# Patient Record
Sex: Female | Born: 1960 | Race: White | Hispanic: No | Marital: Married | Smoking: Former smoker
Health system: Southern US, Community
[De-identification: ages and names within clinical notes are randomized; demographics above are authoritative.]

## PROBLEM LIST (undated history)

## (undated) DIAGNOSIS — Z86718 Personal history of other venous thrombosis and embolism: Secondary | ICD-10-CM

## (undated) DIAGNOSIS — N393 Stress incontinence (female) (male): Secondary | ICD-10-CM

## (undated) DIAGNOSIS — K5792 Diverticulitis of intestine, part unspecified, without perforation or abscess without bleeding: Secondary | ICD-10-CM

## (undated) DIAGNOSIS — C8110 Nodular sclerosis classical Hodgkin lymphoma, unspecified site: Secondary | ICD-10-CM

## (undated) DIAGNOSIS — G4733 Obstructive sleep apnea (adult) (pediatric): Secondary | ICD-10-CM

## (undated) DIAGNOSIS — G709 Myoneural disorder, unspecified: Secondary | ICD-10-CM

## (undated) DIAGNOSIS — M1990 Unspecified osteoarthritis, unspecified site: Secondary | ICD-10-CM

## (undated) DIAGNOSIS — F5081 Binge eating disorder: Secondary | ICD-10-CM

## (undated) DIAGNOSIS — R112 Nausea with vomiting, unspecified: Secondary | ICD-10-CM

## (undated) DIAGNOSIS — G971 Other reaction to spinal and lumbar puncture: Secondary | ICD-10-CM

## (undated) DIAGNOSIS — F418 Other specified anxiety disorders: Secondary | ICD-10-CM

## (undated) DIAGNOSIS — K219 Gastro-esophageal reflux disease without esophagitis: Secondary | ICD-10-CM

## (undated) DIAGNOSIS — G8929 Other chronic pain: Secondary | ICD-10-CM

## (undated) DIAGNOSIS — Z86711 Personal history of pulmonary embolism: Secondary | ICD-10-CM

## (undated) DIAGNOSIS — Z9889 Other specified postprocedural states: Secondary | ICD-10-CM

## (undated) DIAGNOSIS — Z9884 Bariatric surgery status: Secondary | ICD-10-CM

## (undated) DIAGNOSIS — M549 Dorsalgia, unspecified: Secondary | ICD-10-CM

## (undated) DIAGNOSIS — S2239XA Fracture of one rib, unspecified side, initial encounter for closed fracture: Secondary | ICD-10-CM

## (undated) HISTORY — DX: Other specified anxiety disorders: F41.8

## (undated) HISTORY — DX: Gastro-esophageal reflux disease without esophagitis: K21.9

## (undated) HISTORY — PX: PATELLA FRACTURE SURGERY: SHX735

## (undated) HISTORY — DX: Nodular sclerosis Hodgkin lymphoma, unspecified site: C81.10

## (undated) HISTORY — PX: EYE SURGERY: SHX253

## (undated) HISTORY — DX: Diverticulitis of intestine, part unspecified, without perforation or abscess without bleeding: K57.92

## (undated) HISTORY — PX: TRANSTHORACIC ECHOCARDIOGRAM: SHX275

## (undated) HISTORY — DX: Personal history of other venous thrombosis and embolism: Z86.718

## (undated) HISTORY — DX: Binge eating disorder: F50.81

## (undated) HISTORY — DX: Personal history of pulmonary embolism: Z86.711

---

## 1980-10-12 HISTORY — PX: TONSILLECTOMY: SUR1361

## 1983-10-13 HISTORY — PX: TONSILECTOMY, ADENOIDECTOMY, BILATERAL MYRINGOTOMY AND TUBES: SHX2538

## 1997-10-12 HISTORY — PX: GASTRIC BYPASS: SHX52

## 1998-03-12 DIAGNOSIS — Z9884 Bariatric surgery status: Secondary | ICD-10-CM

## 1998-03-12 HISTORY — DX: Bariatric surgery status: Z98.84

## 2001-10-12 HISTORY — PX: LASIK: SHX215

## 2007-10-13 HISTORY — PX: LUMBAR DISC SURGERY: SHX700

## 2009-10-12 HISTORY — PX: OTHER SURGICAL HISTORY: SHX169

## 2009-12-24 LAB — HM COLONOSCOPY: HM Colonoscopy: NORMAL

## 2010-10-21 LAB — HM PAP SMEAR: HM Pap smear: NORMAL

## 2010-10-21 LAB — CONVERTED CEMR LAB: Pap Smear: NORMAL

## 2010-11-07 ENCOUNTER — Ambulatory Visit: Payer: Self-pay | Admitting: Oncology

## 2010-11-11 ENCOUNTER — Telehealth: Payer: Self-pay | Admitting: Internal Medicine

## 2010-11-11 ENCOUNTER — Other Ambulatory Visit: Payer: Self-pay | Admitting: Internal Medicine

## 2010-11-11 ENCOUNTER — Ambulatory Visit
Admission: RE | Admit: 2010-11-11 | Discharge: 2010-11-11 | Payer: Self-pay | Source: Home / Self Care | Attending: Internal Medicine | Admitting: Internal Medicine

## 2010-11-11 DIAGNOSIS — I82409 Acute embolism and thrombosis of unspecified deep veins of unspecified lower extremity: Secondary | ICD-10-CM | POA: Insufficient documentation

## 2010-11-11 DIAGNOSIS — I2699 Other pulmonary embolism without acute cor pulmonale: Secondary | ICD-10-CM | POA: Insufficient documentation

## 2010-11-11 DIAGNOSIS — Z8571 Personal history of Hodgkin lymphoma: Secondary | ICD-10-CM | POA: Insufficient documentation

## 2010-11-11 DIAGNOSIS — F411 Generalized anxiety disorder: Secondary | ICD-10-CM | POA: Insufficient documentation

## 2010-11-11 DIAGNOSIS — M1712 Unilateral primary osteoarthritis, left knee: Secondary | ICD-10-CM | POA: Insufficient documentation

## 2010-11-11 DIAGNOSIS — K219 Gastro-esophageal reflux disease without esophagitis: Secondary | ICD-10-CM | POA: Insufficient documentation

## 2010-11-11 DIAGNOSIS — G8929 Other chronic pain: Secondary | ICD-10-CM | POA: Insufficient documentation

## 2010-11-11 DIAGNOSIS — M545 Low back pain: Secondary | ICD-10-CM | POA: Insufficient documentation

## 2010-11-11 DIAGNOSIS — F329 Major depressive disorder, single episode, unspecified: Secondary | ICD-10-CM | POA: Insufficient documentation

## 2010-11-11 DIAGNOSIS — Z86718 Personal history of other venous thrombosis and embolism: Secondary | ICD-10-CM | POA: Insufficient documentation

## 2010-11-11 LAB — CBC WITH DIFFERENTIAL/PLATELET
Basophils Absolute: 0 10*3/uL (ref 0.0–0.1)
Eosinophils Relative: 0.3 % (ref 0.0–5.0)
HCT: 35.6 % — ABNORMAL LOW (ref 36.0–46.0)
Hemoglobin: 12 g/dL (ref 12.0–15.0)
Lymphocytes Relative: 12.9 % (ref 12.0–46.0)
Lymphs Abs: 0.8 10*3/uL (ref 0.7–4.0)
Monocytes Relative: 5.5 % (ref 3.0–12.0)
Neutro Abs: 4.8 10*3/uL (ref 1.4–7.7)
RBC: 3.73 Mil/uL — ABNORMAL LOW (ref 3.87–5.11)
RDW: 14.2 % (ref 11.5–14.6)
WBC: 5.9 10*3/uL (ref 4.5–10.5)

## 2010-11-18 ENCOUNTER — Encounter: Payer: Self-pay | Admitting: Cardiovascular Disease

## 2010-11-18 ENCOUNTER — Encounter (INDEPENDENT_AMBULATORY_CARE_PROVIDER_SITE_OTHER): Payer: 59

## 2010-11-18 DIAGNOSIS — I2699 Other pulmonary embolism without acute cor pulmonale: Secondary | ICD-10-CM

## 2010-11-18 DIAGNOSIS — Z7901 Long term (current) use of anticoagulants: Secondary | ICD-10-CM

## 2010-11-18 LAB — CONVERTED CEMR LAB: POC INR: 1.7

## 2010-11-19 NOTE — Progress Notes (Signed)
  Phone Note Outgoing Call   Summary of Call: INR=3.8 , LMOVM for her to hold coumadin for 3 days then lower her dose to 7.5 once daily and have a repeat INR in one week Initial call taken by: Etta Grandchild MD,  November 11, 2010 12:05 PM  Follow-up for Phone Call       Follow-up by: Etta Grandchild MD,  November 11, 2010 12:02 PM

## 2010-11-19 NOTE — Assessment & Plan Note (Signed)
Summary: NEW/ [facility] /NWS #   Vital Signs:  Patient profile:   50 year old female Menstrual status:  postmenopausal Height:      68 inches Weight:      245 pounds BMI:     37.39 O2 Sat:      96 % on Room air Temp:     98.7 degrees F oral Pulse rate:   92 / minute Pulse rhythm:   regular Resp:     16 per minute BP sitting:   138 / 88  (left arm) Cuff size:   large  Vitals Entered By: Rock Nephew CMA (November 11, 2010 9:33 AM)  Nutrition Counseling: Patient's BMI is greater than 25 and therefore counseled on weight management options.  O2 Flow:  Room air CC: New to establish, Is Patient Diabetic? No Pain Assessment Patient in pain? no       Does patient need assistance? Functional Status Self care Ambulation Normal     Menstrual Status postmenopausal Last PAP Result Normal   Primary Care Provider:  Etta Grandchild MD  CC:  New to establish and .  History of Present Illness: New to me she just moved to [place] from [place] to work for [employer]. She is s/p chemotherapy for Hodgkins Lymphoma that was treated with chemo., she has an appt. soon with Dr. Cyndie Chime. Her chemo was complicated by a DVT in her RLE with a PE. 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 Her INR was 2.4 in [place] last week.  Preventive Screening-Counseling & Management  Alcohol-Tobacco     Alcohol drinks/day: 0     Alcohol Counseling: not indicated; patient does not drink     Smoking Status: quit > 6 months     Year Quit: 2008     Tobacco Counseling: to remain off tobacco products  Caffeine-Diet-Exercise     Does Patient Exercise: no  Hep-HIV-STD-Contraception     Hepatitis Risk: no risk noted     HIV Risk: no risk noted     STD Risk: no risk noted      Sexual History:  not active.        Drug Use:  no.        Blood Transfusions:  no.    Medications Prior to Update: 1)  None  Current Medications (verified): 1)   Coumadin 7.5 Mg Tabs (Warfarin Sodium) .... Three X Weekly 2)  Coumadin 10 Mg Tabs (Warfarin Sodium) .... Four X Week 3)  Pristiq 100 Mg Xr24h-Tab (Desvenlafaxine Succinate) .... Take 1 Tablet By Mouth Once A Day 4)  Ambien Cr 12.5 Mg Cr-Tabs (Zolpidem Tartrate) .... Take 1 Tablet By Mouth Once A Day 5)  Lorazepam 1 Mg Tabs (Lorazepam) .... Take 1 Tablet By Mouth Three Times A Day 6)  Oxycontin 40 Mg Xr12h-Tab (Oxycodone Hcl) .... Take 1 Tablet By Mouth Two Times A Day 7)  Oxycodone-Acetaminophen 7.5-325 Mg Tabs (Oxycodone-Acetaminophen) .Marland Kitchen.. 1-2 Tab Two Times A Day- Three Times A Day  As Needed  Allergies (verified): 1)  ! Betadine  Past History:  Past Medical History: Anxiety Depression DVT, hx of GERD Low back pain Osteoarthritis Pulmonary embolism, hx of Hodgkins Lymphoma  Past Surgical History: Gastric bypass Lumbar laminectomy Tonsillectomy  Family History: Family History of Colon CA 1st degree relative <60 Family History Hypertension  Social History: Occupation: Automotive engineer Single Alcohol use-no Drug use-no Regular exercise-no Smoking Status:  quit > 6  months Hepatitis Risk:  no risk noted HIV Risk:  no risk noted STD Risk:  no risk noted Sexual History:  not active Blood Transfusions:  no Drug Use:  no Does Patient Exercise:  no  Review of Systems       The patient complains of weight gain and difficulty walking.  The patient denies anorexia, fever, weight loss, chest pain, syncope, dyspnea on exertion, peripheral edema, prolonged cough, headaches, hemoptysis, abdominal pain, melena, hematochezia, severe indigestion/heartburn, hematuria, muscle weakness, suspicious skin lesions, depression, abnormal bleeding, enlarged lymph nodes, and angioedema.   General:  Denies chills, fatigue, fever, loss of appetite, malaise, sleep disorder, sweats, and weight loss. Resp:  Denies chest pain with inspiration, cough, coughing up blood, excessive snoring,  hypersomnolence, morning headaches, pleuritic, shortness of breath, sputum productive, and wheezing. MS:  Complains of joint pain and low back pain; denies joint redness, joint swelling, loss of strength, muscle aches, muscle, cramps, muscle weakness, stiffness, and thoracic pain. Psych:  Complains of anxiety; denies alternate hallucination ( auditory/visual), depression, easily angered, easily tearful, irritability, mental problems, panic attacks, sense of great danger, suicidal thoughts/plans, thoughts of violence, unusual visions or sounds, and thoughts /plans of harming others. Heme:  Denies abnormal bruising, bleeding, enlarge lymph nodes, fevers, pallor, and skin discoloration.  Physical Exam  General:  alert, well-developed, well-nourished, well-hydrated, appropriate dress, normal appearance, healthy-appearing, cooperative to examination, good hygiene, and overweight-appearing.   Head:  normocephalic and atraumatic.   Eyes:  vision grossly intact, pupils equal, pupils round, and pupils reactive to light.   Ears:  R ear normal and L ear normal.   Nose:  External nasal examination shows no deformity or inflammation. Nasal mucosa are pink and moist without lesions or exudates. Mouth:  Oral mucosa and oropharynx without lesions or exudates.  Teeth in good repair. Neck:  right lateral neck scar. supple, full ROM, no masses, no thyromegaly, no thyroid nodules or tenderness, no JVD, no HJR, normal carotid upstroke, no carotid bruits, no cervical lymphadenopathy, and no neck tenderness.   Lungs:  normal respiratory effort, no intercostal retractions, no accessory muscle use, normal breath sounds, no dullness, no fremitus, no crackles, and no wheezes.   Heart:  normal rate, regular rhythm, no murmur, no gallop, no rub, and no JVD.   Abdomen:  soft, non-tender, normal bowel sounds, no distention, no masses, no guarding, no rigidity, no rebound tenderness, no abdominal hernia, no inguinal hernia, no  hepatomegaly, and no splenomegaly.   Msk:  normal ROM, no joint tenderness, no joint swelling, no joint warmth, no redness over joints, no joint deformities, no joint instability, no crepitation, and no muscle atrophy.   Pulses:  R and L carotid,radial,femoral,dorsalis pedis and posterior tibial pulses are full and equal bilaterally Extremities:  No clubbing, cyanosis, edema, or deformity noted with normal full range of motion of all joints.   Neurologic:  No cranial nerve deficits noted. Station and gait are normal. Plantar reflexes are down-going bilaterally. DTRs are symmetrical throughout. Sensory, motor and coordinative functions appear intact. Skin:  turgor normal, color normal, no rashes, no suspicious lesions, no ecchymoses, no petechiae, no purpura, and no edema.   Cervical Nodes:  no anterior cervical adenopathy and no posterior cervical adenopathy.   Axillary Nodes:  no R axillary adenopathy and no L axillary adenopathy.   Inguinal Nodes:  no R inguinal adenopathy and no L inguinal adenopathy.   Psych:  Cognition and judgment appear intact. Alert and cooperative with normal attention span and concentration.  No apparent delusions, illusions, hallucinations   Impression & Recommendations:  Problem # 1:  LOW BACK PAIN (ICD-724.2) Assessment Unchanged  Her updated medication list for this problem includes:    Oxycontin 40 Mg Xr12h-tab (Oxycodone hcl) .Marland Kitchen... Take 1 tablet by mouth two times a day    Oxycodone-acetaminophen 7.5-325 Mg Tabs (Oxycodone-acetaminophen) .Marland Kitchen... 1-2 tab two times a day- three times a day  as needed  Problem # 2:  DEPRESSION (ICD-311) Assessment: Unchanged  Her updated medication list for this problem includes:    Pristiq 100 Mg Xr24h-tab (Desvenlafaxine succinate) .Marland Kitchen... Take 1 tablet by mouth once a day    Lorazepam 1 Mg Tabs (Lorazepam) .Marland Kitchen... Take 1 tablet by mouth three times a day  Problem # 3:  ANTICOAGULATION RX (ICD-V58.61) Assessment:  Unchanged  Orders: Venipuncture (04540) TLB-CBC Platelet - w/Differential (85025-CBCD) TLB-PT (Protime) (85610-PTP) [facility] Referral (Coumadin clinic)  Complete Medication List: 1)  Coumadin 7.5 Mg Tabs (Warfarin sodium) .... Three x weekly 2)  Coumadin 10 Mg Tabs (Warfarin sodium) .... Four x week 3)  Pristiq 100 Mg Xr24h-tab (Desvenlafaxine succinate) .... Take 1 tablet by mouth once a day 4)  Ambien Cr 12.5 Mg Cr-tabs (Zolpidem tartrate) .... Take 1 tablet by mouth once a day 5)  Lorazepam 1 Mg Tabs (Lorazepam) .... Take 1 tablet by mouth three times a day 6)  Oxycontin 40 Mg Xr12h-tab (Oxycodone hcl) .... Take 1 tablet by mouth two times a day 7)  Oxycodone-acetaminophen 7.5-325 Mg Tabs (Oxycodone-acetaminophen) .Marland Kitchen.. 1-2 tab two times a day- three times a day  as needed  Colorectal Screening:  Colonoscopy Results:    Date of Exam: 12/24/2009    Results: Normal  PAP Screening:    Hx Cervical Dysplasia in last 5 yrs? No    3 normal PAP smears in last 5 yrs? Yes    Last PAP smear:  10/21/2010  PAP Smear Results:    Date of Exam:  10/21/2010    Results:  Normal  Mammogram Screening:    Last Mammogram:  10/28/2010    Reviewed Mammogram recommendations:  patient defers but will consider in the future  Mammogram Results:    Date of Exam:  10/28/2010    Results:  Normal Bilateral  Osteoporosis Risk Assessment:  Risk Factors for Fracture or Low Bone Density:   Smoking status:       quit > 6 months  Immunization & Chemoprophylaxis:    Influenza vaccine: Historical  (10/12/2009)    Pneumovax: Historical  (10/12/2009)  Patient Instructions: 1)  Please schedule a follow-up appointment in 1 month. Prescriptions: OXYCODONE-ACETAMINOPHEN 7.5-325 MG TABS (OXYCODONE-ACETAMINOPHEN) 1-2 tab two times a day- three times a day  as needed  #100 x 0   Entered and Authorized by:   Etta Grandchild MD   Signed by:   Etta Grandchild MD on 11/11/2010   Method used:    Print then Give to Patient   RxID:   [id number] OXYCONTIN 40 MG XR12H-TAB (OXYCODONE HCL) Take 1 tablet by mouth two times a day  #60 x 0   Entered and Authorized by:   Etta Grandchild MD   Signed by:   Etta Grandchild MD on 11/11/2010   Method used:   Print then Give to Patient   RxID:   [id number]    Orders Added: 1)  Venipuncture [13244] 2)  TLB-CBC Platelet - w/Differential [85025-CBCD] 3)  TLB-PT (Protime) [85610-PTP] 4)  [facility] Referral [Coumadin  clinic] 5)  New Patient Level IV [16109]   Immunization History:  Influenza Immunization History:    Influenza:  historical (10/12/2009)  Pneumovax Immunization History:    Pneumovax:  historical (10/12/2009)   Immunization History:  Influenza Immunization History:    Influenza:  Historical (10/12/2009)  Pneumovax Immunization History:    Pneumovax:  Historical (10/12/2009)

## 2010-11-24 ENCOUNTER — Encounter (INDEPENDENT_AMBULATORY_CARE_PROVIDER_SITE_OTHER): Payer: 59

## 2010-11-24 ENCOUNTER — Encounter: Payer: Self-pay | Admitting: Cardiology

## 2010-11-24 DIAGNOSIS — I2699 Other pulmonary embolism without acute cor pulmonale: Secondary | ICD-10-CM

## 2010-11-26 ENCOUNTER — Encounter: Payer: Self-pay | Admitting: Internal Medicine

## 2010-11-26 ENCOUNTER — Other Ambulatory Visit: Payer: Self-pay | Admitting: Oncology

## 2010-11-26 ENCOUNTER — Encounter (HOSPITAL_BASED_OUTPATIENT_CLINIC_OR_DEPARTMENT_OTHER): Payer: 59 | Admitting: Oncology

## 2010-11-26 DIAGNOSIS — K911 Postgastric surgery syndromes: Secondary | ICD-10-CM

## 2010-11-26 DIAGNOSIS — I2782 Chronic pulmonary embolism: Secondary | ICD-10-CM

## 2010-11-26 DIAGNOSIS — C8111 Nodular sclerosis classical Hodgkin lymphoma, lymph nodes of head, face, and neck: Secondary | ICD-10-CM

## 2010-11-26 LAB — CBC WITH DIFFERENTIAL/PLATELET
Basophils Absolute: 0 10*3/uL (ref 0.0–0.1)
EOS%: 0.3 % (ref 0.0–7.0)
Eosinophils Absolute: 0 10*3/uL (ref 0.0–0.5)
HGB: 12.7 g/dL (ref 11.6–15.9)
MCH: 31.4 pg (ref 25.1–34.0)
MCV: 93.8 fL (ref 79.5–101.0)
MONO%: 6.3 % (ref 0.0–14.0)
NEUT#: 4.8 10*3/uL (ref 1.5–6.5)
RBC: 4.05 10*6/uL (ref 3.70–5.45)
RDW: 13 % (ref 11.2–14.5)
lymph#: 1.1 10*3/uL (ref 0.9–3.3)

## 2010-11-26 LAB — CONVERTED CEMR LAB
ALT: 16 units/L
AST: 16 units/L
Alkaline Phosphatase: 66 units/L
Calcium: 9 mg/dL
Glucose, Bld: 86 mg/dL
Sodium: 143 meq/L
Total Bilirubin: 0.1 mg/dL

## 2010-11-26 LAB — COMPREHENSIVE METABOLIC PANEL
Albumin: 3.8 g/dL (ref 3.5–5.2)
BUN: 18 mg/dL (ref 6–23)
Calcium: 9 mg/dL (ref 8.4–10.5)
Chloride: 109 mEq/L (ref 96–112)
Creatinine, Ser: 0.72 mg/dL (ref 0.40–1.20)
Glucose, Bld: 86 mg/dL (ref 70–99)
Potassium: 4.1 mEq/L (ref 3.5–5.3)

## 2010-11-26 LAB — PROTIME-INR
INR: 3 (ref 2.00–3.50)
Protime: 36 Seconds — ABNORMAL HIGH (ref 10.6–13.4)

## 2010-11-27 LAB — SEDIMENTATION RATE: Sed Rate: 1 mm/hr (ref 0–22)

## 2010-11-27 LAB — LACTATE DEHYDROGENASE: LDH: 152 U/L (ref 94–250)

## 2010-11-27 NOTE — Medication Information (Signed)
Summary: Coumadin Clinic  Anticoagulant Therapy  Managed by: Bethena Midget, RN, BSN Referring MD: Yetta Barre MD, Maisie Fus PCP: Etta Grandchild MD Supervising MD: Eden Emms MD, Theron Arista Indication 1: Pulmonary Embolism Lab Used: [facility] [place] Site: [address] INR POC 1.7 INR RANGE 2.0-3.0  Dietary changes: no    Health status changes: no    Bleeding/hemorrhagic complications: no    Recent/future hospitalizations: yes       Details: PE 11/11  Any changes in medication regimen? no    Recent/future dental: no  Any missed doses?: yes     Details: Held 3 days prior to last Friday, then restarted with 7.5mg s daily per Dr Yetta Barre  Is patient compliant with meds? yes      Comments: Started on coumadin Nov 2011  Allergies: 1)  ! Betadine  Anticoagulation Management History:      The patient comes in today for her initial visit for anticoagulation therapy.  Negative risk factors for bleeding include an age less than 51 years old.  The bleeding index is 'low risk'.  Negative CHADS2 values include Age > 61 years old.  Her last INR was 3.8 ratio.  Anticoagulation responsible provider: Eden Emms MD, Theron Arista.  INR POC: 1.7.  Cuvette Lot#: [id number].  Exp: 11/2011.    Anticoagulation Management Assessment/Plan:      The patient's current anticoagulation dose is Coumadin 7.5 mg tabs: Three x weekly, Coumadin 10 mg tabs: Four x week.  The target INR is 2.0-3.0.  The next INR is due 11/24/2010.  Anticoagulation instructions were given to patient.  Results were reviewed/authorized by Bethena Midget, RN, BSN.  She was notified by Bethena Midget, RN, BSN.         Current Anticoagulation Instructions: INR 1.7 Change dose to 7.5mg s daily except 3.75mg s on Wednesday. Recheck in one week.

## 2010-12-01 ENCOUNTER — Encounter: Payer: Self-pay | Admitting: Internal Medicine

## 2010-12-03 NOTE — Medication Information (Signed)
Summary: Coumadin Clinic  Anticoagulant Therapy  Managed by: Samantha Crimes, PharmD Referring MD: Yetta Barre MD, Maisie Fus PCP: Etta Grandchild MD Supervising MD: Jens Som MD, Arlys John Indication 1: Pulmonary Embolism Lab Used: [facility] [place] Site: [address] INR POC 2.4 INR RANGE 2.0-3.0  Dietary changes: no    Health status changes: no    Bleeding/hemorrhagic complications: no    Recent/future hospitalizations: no    Any changes in medication regimen? no    Recent/future dental: no  Any missed doses?: no       Is patient compliant with meds? yes       Current Medications (verified): 1)  Coumadin 7.5 Mg Tabs (Warfarin Sodium) .... Three X Weekly 2)  Coumadin 10 Mg Tabs (Warfarin Sodium) .... Four X Week 3)  Pristiq 100 Mg Xr24h-Tab (Desvenlafaxine Succinate) .... Take 1 Tablet By Mouth Once A Day 4)  Ambien Cr 12.5 Mg Cr-Tabs (Zolpidem Tartrate) .... Take 1 Tablet By Mouth Once A Day 5)  Lorazepam 1 Mg Tabs (Lorazepam) .... Take 1 Tablet By Mouth Three Times A Day 6)  Oxycontin 40 Mg Xr12h-Tab (Oxycodone Hcl) .... Take 1 Tablet By Mouth Two Times A Day 7)  Oxycodone-Acetaminophen 7.5-325 Mg Tabs (Oxycodone-Acetaminophen) .Marland Kitchen.. 1-2 Tab Two Times A Day- Three Times A Day  As Needed  Allergies (verified): 1)  ! Betadine  Anticoagulation Management History:      Negative risk factors for bleeding include an age less than 59 years old.  The bleeding index is 'low risk'.  Negative CHADS2 values include Age > 62 years old.  Her last INR was 3.8 ratio.  Anticoagulation responsible provider: Jens Som MD, Arlys John.  INR POC: 2.4.  Exp: 11/2011.    Anticoagulation Management Assessment/Plan:      The patient's current anticoagulation dose is Coumadin 7.5 mg tabs: Three x weekly, Coumadin 10 mg tabs: Four x week.  The target INR is 2.0-3.0.  The next INR is due 12/08/2010.  Anticoagulation instructions were given to patient.  Results were reviewed/authorized by Samantha Crimes, PharmD.         Prior Anticoagulation Instructions: INR 1.7 Change dose to 7.5mg s daily except 3.75mg s on Wednesday. Recheck in one week.  Current Anticoagulation Instructions: Return to clinic on 12/09/10 @ 1200 Cont with same drug regimen until next appointment

## 2010-12-07 DIAGNOSIS — I82409 Acute embolism and thrombosis of unspecified deep veins of unspecified lower extremity: Secondary | ICD-10-CM

## 2010-12-07 DIAGNOSIS — Z7901 Long term (current) use of anticoagulants: Secondary | ICD-10-CM

## 2010-12-07 DIAGNOSIS — I2699 Other pulmonary embolism without acute cor pulmonale: Secondary | ICD-10-CM

## 2010-12-08 ENCOUNTER — Encounter: Payer: Self-pay | Admitting: Internal Medicine

## 2010-12-08 ENCOUNTER — Ambulatory Visit (INDEPENDENT_AMBULATORY_CARE_PROVIDER_SITE_OTHER): Payer: 59 | Admitting: Internal Medicine

## 2010-12-08 DIAGNOSIS — F329 Major depressive disorder, single episode, unspecified: Secondary | ICD-10-CM

## 2010-12-08 DIAGNOSIS — G8929 Other chronic pain: Secondary | ICD-10-CM

## 2010-12-08 DIAGNOSIS — M1990 Unspecified osteoarthritis, unspecified site: Secondary | ICD-10-CM

## 2010-12-08 DIAGNOSIS — M545 Low back pain: Secondary | ICD-10-CM

## 2010-12-08 DIAGNOSIS — F411 Generalized anxiety disorder: Secondary | ICD-10-CM

## 2010-12-09 NOTE — Miscellaneous (Signed)
Summary: cancer center labs  Clinical Lists Changes  Observations: Added new observation of LT_CMP_COM: LDH-152 (11/26/2010 11:39) Added new observation of ALBUMIN: 3.8 g/dL (40/98/1191 47:82) Added new observation of PROTEIN, TOT: 5.9 g/dL (95/62/1308 65:78) Added new observation of CALCIUM: 9.0 mg/dL (46/96/2952 84:13) Added new observation of ALK PHOS: 66 units/L (11/26/2010 11:38) Added new observation of BILI TOTAL: 0.1 mg/dL (24/40/1027 25:36) Added new observation of SGPT (ALT): 16 units/L (11/26/2010 11:38) Added new observation of SGOT (AST): 16 units/L (11/26/2010 11:38) Added new observation of CO2 PLSM/SER: 27 meq/L (11/26/2010 11:38) Added new observation of CL SERUM: 109 meq/L (11/26/2010 11:38) Added new observation of K SERUM: 4.1 meq/L (11/26/2010 11:38) Added new observation of NA: 143 meq/L (11/26/2010 11:38) Added new observation of CREATININE: 0.72 mg/dL (64/40/3474 25:95) Added new observation of BUN: 18 mg/dL (63/87/5643 32:95) Added new observation of BG RANDOM: 86 mg/dL (18/84/1660 63:01)      -  Date:  11/26/2010    BG Random: 86    BUN: 18    Creatinine: 0.72    Sodium: 143    Potassium: 4.1    Chloride: 109    CO2 Total: 27    SGOT (AST): 16    SGPT (ALT): 16    T. Bilirubin: 0.1    Alk Phos: 66    Calcium: 9.0    Total Protein: 5.9    Albumin: 3.8    CMP Comment: LDH-152  Appended Document: cancer center labs    Clinical Lists Changes  Observations: Added new observation of CBC COMMENTS: Teardrop Cell- Few, Ovalocyte- Moderate, White Cell comments- C?W auto diff, PLT EST- Adequte (12/01/2010 11:59) Added new observation of INR: 3.0  (11/26/2010 12:54) Added new observation of PT PATIENT: 36.0 s (11/26/2010 12:54) Added new observation of BASOPHIL %: 0.0 % (11/26/2010 12:00) Added new observation of % EOS AUTO: 0.0 % (11/26/2010 12:00) Added new observation of MONOCYTE %: 0.4 % (11/26/2010 12:00) Added new observation of LYMPH %:  1.1 % (11/26/2010 12:00) Added new observation of PMN %: 4.8 % (11/26/2010 12:00) Added new observation of RDW: 13.0 % (11/26/2010 12:00) Added new observation of MCV: 93.8 fL (11/26/2010 12:00) Added new observation of PLATELETK/UL: 285 K/uL (11/26/2010 12:00) Added new observation of RBC M/UL: 4.05 M/uL (11/26/2010 12:00) Added new observation of HCT: 38.0 % (11/26/2010 12:00) Added new observation of HGB: 12.7 g/dL (60/07/9322 55:73) Added new observation of WBC COUNT: 6.4 10*3/microliter (11/26/2010 12:00)       -  Date:  12/01/2010    CBC Comments: Teardrop Cell- Few, Ovalocyte- Moderate, White Cell comments- C?W auto diff, PLT EST- Adequte  Date:  11/26/2010    WBC: 6.4    HGB: 12.7    HCT: 38.0    RBC: 4.05    PLT: 285    MCV: 93.8    RDW: 13.0    Neutrophil: 4.8    Lymphs: 1.1    Monos: 0.4    Eos: 0.0    Basophil: 0.0    Protime (PT): 36.0    INR: 3.0

## 2010-12-10 ENCOUNTER — Encounter (HOSPITAL_COMMUNITY): Payer: Self-pay

## 2010-12-10 ENCOUNTER — Encounter (HOSPITAL_COMMUNITY)
Admission: RE | Admit: 2010-12-10 | Discharge: 2010-12-10 | Disposition: A | Discharge status: Home / Self Care | Payer: 59 | Source: Ambulatory Visit | Attending: Oncology | Admitting: Oncology

## 2010-12-10 DIAGNOSIS — Z79899 Other long term (current) drug therapy: Secondary | ICD-10-CM | POA: Insufficient documentation

## 2010-12-10 DIAGNOSIS — C8190 Hodgkin lymphoma, unspecified, unspecified site: Secondary | ICD-10-CM | POA: Insufficient documentation

## 2010-12-10 MED ORDER — FLUDEOXYGLUCOSE F - 18 (FDG) INJECTION
18.8000 | Freq: Once | INTRAVENOUS | Status: AC | PRN
  Administered 2010-12-10: 18.8 via INTRAVENOUS

## 2010-12-11 LAB — GLUCOSE, CAPILLARY: Glucose-Capillary: 117 mg/dL — ABNORMAL HIGH (ref 70–99)

## 2010-12-12 ENCOUNTER — Encounter: Payer: Self-pay | Admitting: Cardiovascular Disease

## 2010-12-12 ENCOUNTER — Encounter (INDEPENDENT_AMBULATORY_CARE_PROVIDER_SITE_OTHER): Payer: 59

## 2010-12-12 DIAGNOSIS — I2699 Other pulmonary embolism without acute cor pulmonale: Secondary | ICD-10-CM

## 2010-12-12 DIAGNOSIS — Z7901 Long term (current) use of anticoagulants: Secondary | ICD-10-CM

## 2010-12-18 NOTE — Letter (Signed)
Summary: [facility]  [facility]   Imported By: Sherian Rein 12/10/2010 08:36:13  _____________________________________________________________________  External Attachment:    Type:   Image     Comment:   External Document

## 2010-12-18 NOTE — Medication Information (Signed)
Summary: rov/tm  Anticoagulant Therapy  Managed by: Bethena Midget, RN, BSN Referring MD: Yetta Barre MD, Maisie Fus PCP: Etta Grandchild MD Supervising MD: Clifton James MD, Cristal Deer Indication 1: Pulmonary Embolism Lab Used: [facility] [place] Site: [address] INR POC 3.2 INR RANGE 2.0-3.0  Dietary changes: no    Health status changes: no    Bleeding/hemorrhagic complications: no    Recent/future hospitalizations: no    Any changes in medication regimen? no    Recent/future dental: no  Any missed doses?: no       Is patient compliant with meds? yes       Allergies: 1)  ! Betadine  Anticoagulation Management History:      The patient is taking warfarin and comes in today for a routine follow up visit.  Negative risk factors for bleeding include an age less than 89 years old.  The bleeding index is 'low risk'.  Negative CHADS2 values include Age > 28 years old.  Her last INR was 3.0.  Anticoagulation responsible provider: Clifton James MD, Cristal Deer.  INR POC: 3.2.  Cuvette Lot#: [id number].  Exp: 10/2011.    Anticoagulation Management Assessment/Plan:      The patient's current anticoagulation dose is Coumadin 7.5 mg tabs: Three x weekly, Coumadin 10 mg tabs: Four x week.  The target INR is 2.0-3.0.  The next INR is due 12/26/2010.  Anticoagulation instructions were given to patient.  Results were reviewed/authorized by Bethena Midget, RN, BSN.  She was notified by Bethena Midget, RN, BSN.         Prior Anticoagulation Instructions: Return to clinic on 12/09/10 @ 1200 Cont with same drug regimen until next appointment  Current Anticoagulation Instructions: INR 3.2 Today take 1/2  tablet, change dose to 1 tablet daily except 1/2 tablet on Mondays and Fridays. Recheck in 2 weeks.

## 2010-12-18 NOTE — Assessment & Plan Note (Signed)
Summary: 1 month f/u   Vital Signs:  Patient profile:   50 year old female Menstrual status:  postmenopausal Height:      68 inches Weight:      231 pounds BMI:     35.25 O2 Sat:      98 % on Room air Temp:     97.8 degrees F oral Pulse rate:   84 / minute Pulse rhythm:   regular Resp:     20 per minute BP sitting:   108 / 64  (left arm) Cuff size:   large  Vitals Entered By: Rock Nephew CMA (December 08, 2010 8:41 AM)  Nutrition Counseling: Patient's BMI is greater than 25 and therefore counseled on weight management options.  O2 Flow:  Room air CC: follow-up visit// discuss medication change and refill Is Patient Diabetic? No Pain Assessment Patient in pain? no       Does patient need assistance? Functional Status Self care Ambulation Normal   Primary Care Provider:  Etta Grandchild MD  CC:  follow-up visit// discuss medication change and refill.  History of Present Illness: She returns for f/up and tells me that she is doing better with more energy and strength and less pain. She wants to lower her dose of Oxycontin. She is scheduled for a PET scan soon.  Preventive Screening-Counseling & Management  Alcohol-Tobacco     Alcohol drinks/day: 0     Alcohol Counseling: not indicated; patient does not drink     Smoking Status: quit > 6 months     Year Quit: 2008     Tobacco Counseling: to remain off tobacco products  Hep-HIV-STD-Contraception     Hepatitis Risk: no risk noted     HIV Risk: no risk noted     STD Risk: no risk noted      Sexual History:  not active.        Drug Use:  no.        Blood Transfusions:  no.    Clinical Review Panels:  Prevention   Last Mammogram:  Normal Bilateral (10/28/2010)   Last Pap Smear:  Normal (10/21/2010)   Last Colonoscopy:  Normal (12/24/2009)  Immunizations   Last Flu Vaccine:  Historical (10/12/2009)   Last Pneumovax:  Historical (10/12/2009)  Diabetes Management   Creatinine:  0.72 (11/26/2010)  Last Flu Vaccine:  Historical (10/12/2009)   Last Pneumovax:  Historical (10/12/2009)  CBC   WBC:  6.4 (11/26/2010)   RBC:  4.05 (11/26/2010)   Hgb:  12.7 (11/26/2010)   Hct:  38.0 (11/26/2010)   Platelets:  285 (11/26/2010)   MCV  93.8 (11/26/2010)   MCHC  33.6 (11/11/2010)   RDW  13.0 (11/26/2010)   PMN:  4.8 (11/26/2010)   Lymphs:  12.9 (11/11/2010)   Monos:  0.4 (11/26/2010)   Eosinophils:  0.0 (11/26/2010)   Basophil:  0.0 (11/26/2010)  Complete Metabolic Panel   Glucose:  86 (11/26/2010)   Sodium:  143 (11/26/2010)   Potassium:  4.1 (11/26/2010)   Chloride:  109 (11/26/2010)   CO2:  27 (11/26/2010)   BUN:  18 (11/26/2010)   Creatinine:  0.72 (11/26/2010)   Albumin:  3.8 (11/26/2010)   Total Protein:  5.9 (11/26/2010)   Calcium:  9.0 (11/26/2010)   Total Bili:  0.1 (11/26/2010)   Alk Phos:  66 (11/26/2010)   SGPT (ALT):  16 (11/26/2010)   SGOT (AST):  16 (11/26/2010)   Medications Prior to Update: 1)  Coumadin 7.5 Mg Tabs (Warfarin  Sodium) .... Three X Weekly 2)  Coumadin 10 Mg Tabs (Warfarin Sodium) .... Four X Week 3)  Pristiq 100 Mg Xr24h-Tab (Desvenlafaxine Succinate) .... Take 1 Tablet By Mouth Once A Day 4)  Ambien Cr 12.5 Mg Cr-Tabs (Zolpidem Tartrate) .... Take 1 Tablet By Mouth Once A Day 5)  Lorazepam 1 Mg Tabs (Lorazepam) .... Take 1 Tablet By Mouth Three Times A Day 6)  Oxycontin 40 Mg Xr12h-Tab (Oxycodone Hcl) .... Take 1 Tablet By Mouth Two Times A Day 7)  Oxycodone-Acetaminophen 7.5-325 Mg Tabs (Oxycodone-Acetaminophen) .Marland Kitchen.. 1-2 Tab Two Times A Day- Three Times A Day  As Needed  Current Medications (verified): 1)  Coumadin 7.5 Mg Tabs (Warfarin Sodium) .... Three X Weekly 2)  Coumadin 10 Mg Tabs (Warfarin Sodium) .... Four X Week 3)  Pristiq 100 Mg Xr24h-Tab (Desvenlafaxine Succinate) .... Take 1 Tablet By Mouth Once A Day 4)  Ambien Cr 12.5 Mg Cr-Tabs (Zolpidem Tartrate) .... Take 1 Tablet By Mouth Once A Day 5)  Lorazepam 1 Mg Tabs (Lorazepam)  .... Take 1 Tablet By Mouth Three Times A Day 6)  Oxycodone-Acetaminophen 7.5-325 Mg Tabs (Oxycodone-Acetaminophen) .Marland Kitchen.. 1-2 Tab Two Times A Day- Three Times A Day  As Needed 7)  Oxycontin 20 Mg Xr12h-Tab (Oxycodone Hcl) .... One By Mouth Two Times A Day As Needed For Pain  Allergies (verified): 1)  ! Betadine  Past History:  Past Medical History: Last updated: 11/11/2010 Anxiety Depression DVT, hx of GERD Low back pain Osteoarthritis Pulmonary embolism, hx of Hodgkins Lymphoma  Past Surgical History: Last updated: 11/11/2010 Gastric bypass Lumbar laminectomy Tonsillectomy  Family History: Last updated: 11/11/2010 Family History of Colon CA 1st degree relative <60 Family History Hypertension  Social History: Last updated: 11/11/2010 Occupation: Automotive engineer Single Alcohol use-no Drug use-no Regular exercise-no  Risk Factors: Alcohol Use: 0 (12/08/2010) Exercise: no (11/11/2010)  Risk Factors: Smoking Status: quit > 6 months (12/08/2010)  Family History: Reviewed history from 11/11/2010 and no changes required. Family History of Colon CA 1st degree relative <60 Family History Hypertension  Social History: Reviewed history from 11/11/2010 and no changes required. Occupation: Automotive engineer Single Alcohol use-no Drug use-no Regular exercise-no  Review of Systems       The patient complains of weight gain.  The patient denies anorexia, fever, weight loss, chest pain, syncope, dyspnea on exertion, peripheral edema, prolonged cough, headaches, hemoptysis, abdominal pain, hematuria, suspicious skin lesions, transient blindness, difficulty walking, and enlarged lymph nodes.   MS:  Complains of joint pain and low back pain; denies joint redness, joint swelling, loss of strength, muscle aches, muscle, cramps, muscle weakness, stiffness, and thoracic pain. Psych:  Complains of anxiety; denies depression, easily angered, easily tearful, irritability,  mental problems, panic attacks, sense of great danger, suicidal thoughts/plans, thoughts of violence, unusual visions or sounds, and thoughts /plans of harming others.  Physical Exam  General:  alert, well-developed, well-nourished, well-hydrated, appropriate dress, normal appearance, healthy-appearing, cooperative to examination, good hygiene, and overweight-appearing.   Head:  normocephalic and atraumatic.   Mouth:  Oral mucosa and oropharynx without lesions or exudates.  Teeth in good repair. Neck:  right lateral neck scar. supple, full ROM, no masses, no thyromegaly, no thyroid nodules or tenderness, no JVD, no HJR, normal carotid upstroke, no carotid bruits, no cervical lymphadenopathy, and no neck tenderness.   Lungs:  normal respiratory effort, no intercostal retractions, no accessory muscle use, normal breath sounds, no dullness, no fremitus, no crackles, and no wheezes.   Heart:  normal rate, regular rhythm, no murmur, no gallop, no rub, and no JVD.   Abdomen:  soft, non-tender, normal bowel sounds, no distention, no masses, no guarding, no rigidity, no rebound tenderness, no abdominal hernia, no inguinal hernia, no hepatomegaly, and no splenomegaly.   Msk:  normal ROM, no joint tenderness, no joint swelling, no joint warmth, no redness over joints, no joint deformities, no joint instability, no crepitation, and no muscle atrophy.   Pulses:  R and L carotid,radial,femoral,dorsalis pedis and posterior tibial pulses are full and equal bilaterally Extremities:  No clubbing, cyanosis, edema, or deformity noted with normal full range of motion of all joints.   Neurologic:  No cranial nerve deficits noted. Station and gait are normal. Plantar reflexes are down-going bilaterally. DTRs are symmetrical throughout. Sensory, motor and coordinative functions appear intact. Skin:  turgor normal, color normal, no rashes, no suspicious lesions, no ecchymoses, no petechiae, no purpura, and no edema.     Cervical Nodes:  no anterior cervical adenopathy and no posterior cervical adenopathy.   Axillary Nodes:  no R axillary adenopathy and no L axillary adenopathy.   Psych:  Cognition and judgment appear intact. Alert and cooperative with normal attention span and concentration. No apparent delusions, illusions, hallucinations   Impression & Recommendations:  Problem # 1:  OTHER CHRONIC PAIN (ICD-338.29) Assessment Improved  Problem # 2:  OSTEOARTHRITIS (ICD-715.90) Assessment: Improved  The following medications were removed from the medication list:    Oxycontin 40 Mg Xr12h-tab (Oxycodone hcl) .Marland Kitchen... Take 1 tablet by mouth two times a day Her updated medication list for this problem includes:    Oxycodone-acetaminophen 7.5-325 Mg Tabs (Oxycodone-acetaminophen) .Marland Kitchen... 1-2 tab two times a day- three times a day  as needed    Oxycontin 20 Mg Xr12h-tab (Oxycodone hcl) ..... One by mouth two times a day as needed for pain  Problem # 3:  LOW BACK PAIN (ICD-724.2) Assessment: Unchanged  The following medications were removed from the medication list:    Oxycontin 40 Mg Xr12h-tab (Oxycodone hcl) .Marland Kitchen... Take 1 tablet by mouth two times a day Her updated medication list for this problem includes:    Oxycodone-acetaminophen 7.5-325 Mg Tabs (Oxycodone-acetaminophen) .Marland Kitchen... 1-2 tab two times a day- three times a day  as needed    Oxycontin 20 Mg Xr12h-tab (Oxycodone hcl) ..... One by mouth two times a day as needed for pain  Discussed use of moist heat or ice, modified activities, medications, and stretching/strengthening exercises. Back care instructions given. To be seen in 2 weeks if no improvement; sooner if worsening of symptoms.   Problem # 4:  ANXIETY (ICD-300.00) Assessment: Unchanged  Her updated medication list for this problem includes:    Pristiq 100 Mg Xr24h-tab (Desvenlafaxine succinate) .Marland Kitchen... Take 1 tablet by mouth once a day    Lorazepam 1 Mg Tabs (Lorazepam) .Marland Kitchen... Take 1 tablet by  mouth three times a day  Discussed medication use and relaxation techniques.   Complete Medication List: 1)  Coumadin 7.5 Mg Tabs (Warfarin sodium) .... Three x weekly 2)  Coumadin 10 Mg Tabs (Warfarin sodium) .... Four x week 3)  Pristiq 100 Mg Xr24h-tab (Desvenlafaxine succinate) .... Take 1 tablet by mouth once a day 4)  Ambien Cr 12.5 Mg Cr-tabs (Zolpidem tartrate) .... Take 1 tablet by mouth once a day 5)  Lorazepam 1 Mg Tabs (Lorazepam) .... Take 1 tablet by mouth three times a day 6)  Oxycodone-acetaminophen 7.5-325 Mg Tabs (Oxycodone-acetaminophen) .Marland Kitchen.. 1-2 tab two times a day-  three times a day  as needed 7)  Oxycontin 20 Mg Xr12h-tab (Oxycodone hcl) .... One by mouth two times a day as needed for pain  Patient Instructions: 1)  Please schedule a follow-up appointment in 1 month. Prescriptions: LORAZEPAM 1 MG TABS (LORAZEPAM) Take 1 tablet by mouth three times a day  #90 x 5   Entered and Authorized by:   Etta Grandchild MD   Signed by:   Etta Grandchild MD on 12/08/2010   Method used:   Print then Give to Patient   RxID:   [id number] OXYCODONE-ACETAMINOPHEN 7.5-325 MG TABS (OXYCODONE-ACETAMINOPHEN) 1-2 tab two times a day- three times a day  as needed  #100 x 0   Entered and Authorized by:   Etta Grandchild MD   Signed by:   Etta Grandchild MD on 12/08/2010   Method used:   Print then Give to Patient   RxID:   [id number] OXYCONTIN 20 MG XR12H-TAB (OXYCODONE HCL) One by mouth two times a day as needed for pain  #60 x 0   Entered and Authorized by:   Etta Grandchild MD   Signed by:   Etta Grandchild MD on 12/08/2010   Method used:   Print then Give to Patient   RxID:   [id number]    Orders Added: 1)  Est. Patient Level IV [53664]

## 2010-12-26 ENCOUNTER — Encounter (INDEPENDENT_AMBULATORY_CARE_PROVIDER_SITE_OTHER): Payer: 59

## 2010-12-26 ENCOUNTER — Encounter: Payer: Self-pay | Admitting: Cardiology

## 2010-12-26 DIAGNOSIS — Z7901 Long term (current) use of anticoagulants: Secondary | ICD-10-CM

## 2010-12-26 DIAGNOSIS — I2699 Other pulmonary embolism without acute cor pulmonale: Secondary | ICD-10-CM

## 2010-12-30 NOTE — Medication Information (Signed)
Summary: rov/tm  Anticoagulant Therapy  Managed by: Samantha Crimes, PharmD Referring MD: Yetta Barre MD, Maisie Fus PCP: Etta Grandchild MD Supervising MD: Daleen Squibb MD, Maisie Fus Indication 1: Pulmonary Embolism Lab Used: [facility] Site: [address] INR POC 3 INR RANGE 2.0-3.0  Dietary changes: no    Health status changes: no    Bleeding/hemorrhagic complications: no    Recent/future hospitalizations: no    Any changes in medication regimen? no    Recent/future dental: no  Any missed doses?: no       Is patient compliant with meds? yes       Current Medications (verified): 1)  Coumadin 7.5 Mg Tabs (Warfarin Sodium) .... Three X Weekly 2)  Coumadin 10 Mg Tabs (Warfarin Sodium) .... Four X Week 3)  Pristiq 100 Mg Xr24h-Tab (Desvenlafaxine Succinate) .... Take 1 Tablet By Mouth Once A Day 4)  Ambien Cr 12.5 Mg Cr-Tabs (Zolpidem Tartrate) .... Take 1 Tablet By Mouth Once A Day 5)  Lorazepam 1 Mg Tabs (Lorazepam) .... Take 1 Tablet By Mouth Three Times A Day 6)  Oxycodone-Acetaminophen 7.5-325 Mg Tabs (Oxycodone-Acetaminophen) .Marland Kitchen.. 1-2 Tab Two Times A Day- Three Times A Day  As Needed 7)  Oxycontin 20 Mg Xr12h-Tab (Oxycodone Hcl) .... One By Mouth Two Times A Day As Needed For Pain  Allergies (verified): 1)  ! Betadine  Anticoagulation Management History:      Negative risk factors for bleeding include an age less than 98 years old.  The bleeding index is 'low risk'.  Negative CHADS2 values include Age > 12 years old.  Her last INR was 3.0.  Anticoagulation responsible provider: Daleen Squibb MD, Maisie Fus.  INR POC: 3.  Exp: 10/2011.    Anticoagulation Management Assessment/Plan:      The patient's current anticoagulation dose is Coumadin 7.5 mg tabs: Three x weekly, Coumadin 10 mg tabs: Four x week.  The target INR is 2.0-3.0.  The next INR is due 12/26/2010.  Anticoagulation instructions were given to patient.  Results were reviewed/authorized by Samantha Crimes, PharmD.      Prior Anticoagulation Instructions: INR 3.2 Today take 1/2  tablet, change dose to 1 tablet daily except 1/2 tablet on Mondays and Fridays. Recheck in 2 weeks.   Current Anticoagulation Instructions: Return to clinic in 4 weeks on 4/13 @ 12:15 Cont with current regimen

## 2010-12-31 ENCOUNTER — Ambulatory Visit (INDEPENDENT_AMBULATORY_CARE_PROVIDER_SITE_OTHER): Payer: 59 | Admitting: Internal Medicine

## 2010-12-31 ENCOUNTER — Encounter: Payer: Self-pay | Admitting: Internal Medicine

## 2010-12-31 VITALS — BP 118/78 | HR 90 | Temp 98.3°F | Resp 16 | Wt 225.0 lb

## 2010-12-31 DIAGNOSIS — M1990 Unspecified osteoarthritis, unspecified site: Secondary | ICD-10-CM

## 2010-12-31 DIAGNOSIS — G8929 Other chronic pain: Secondary | ICD-10-CM

## 2010-12-31 DIAGNOSIS — M545 Low back pain: Secondary | ICD-10-CM

## 2010-12-31 DIAGNOSIS — F329 Major depressive disorder, single episode, unspecified: Secondary | ICD-10-CM

## 2010-12-31 MED ORDER — OXYCODONE-ACETAMINOPHEN 7.5-325 MG PO TABS: 1.0000 | ORAL_TABLET | ORAL | Status: DC | PRN

## 2010-12-31 MED ORDER — OXYCODONE HCL 20 MG PO TB12: 20.0000 mg | ORAL_TABLET | Freq: Two times a day (BID) | ORAL | Status: DC | PRN

## 2010-12-31 NOTE — Patient Instructions (Signed)
Pain Management, Chronic     You have a painful condition that has required frequent use of narcotic-type pain medicine. We would like to see that you receive the best possible care for your problem. To achieve this, you must have a personal physician who can supervise a treatment plan for you. You may locate a personal physician on your own or contact one of the doctors whose name has been given to you.     If your physician determines that you need to visit the Emergency Department for pain control, that doctor should provide you with a PAIN CONTRACT. This is a letter from your doctor which describes what pain medicine you may receive, how much and how often. You sign it agreeing to the terms of the treatment plan. Bring this with each time you come to this facility. It will help the Emergency Physician provide the proper treatment for you with minimal delay.     Please Note: In the future you may not be able to receive narcotic pain medicine from this facility without a pain contract or telephone approval from your personal physician.     Document Released: 05/21/2004  Document Re-Released: 12/25/2008  [employer] Patient Information 2011 [employer].

## 2010-12-31 NOTE — Progress Notes (Signed)
  Subjective:    Patient ID: Joyce Payne, female    DOB: November 28, 1960, 50 y.o.   MRN: [id number]  HPI She returns for f/up and is doing well on her current regimen for pain control. She is seeing Dr. Dub Mikes next week about her depression and anxiety. She has some fatigue not no other symtpoms today.    Review of Systems  Constitutional: Positive for fatigue. Negative for fever, chills, activity change and appetite change.  Respiratory: Negative for cough, shortness of breath, wheezing and stridor.   Cardiovascular: Negative for chest pain, palpitations and leg swelling.  Gastrointestinal: Negative for abdominal pain, diarrhea, constipation and abdominal distention.  Genitourinary: Negative for dysuria, frequency and difficulty urinating.  Neurological: Negative for dizziness, light-headedness, numbness and headaches.  Psychiatric/Behavioral: Positive for dysphoric mood. Negative for suicidal ideas, hallucinations, behavioral problems, confusion, sleep disturbance, self-injury, decreased concentration and agitation. The patient is nervous/anxious. The patient is not hyperactive.    Past Medical History  Diagnosis Date  . Anxiety   . Depression   . GERD (gastroesophageal reflux disease)   . History of DVT (deep vein thrombosis)   . Low back pain   . Osteoarthritis   . History of pulmonary embolism   . Hodgkin lymphoma    Past Surgical History  Procedure Date  . Gastric bypass   . Lumbar laminectomy   . Tonsillectomy     reports that she has quit smoking. She has never used smokeless tobacco. Her alcohol and drug histories not on file. family history includes Cancer in her other and Hypertension in her other. Allergies  Allergen Reactions  . Povidone-Iodine     REACTION: Rash      Objective:   Physical Exam  Constitutional: She is oriented to person, place, and time. She appears well-developed and well-nourished. No distress.  HENT:  Head: Normocephalic and atraumatic.    Mouth/Throat: Oropharynx is clear and moist. No oropharyngeal exudate.  Eyes: Conjunctivae and EOM are normal. Pupils are equal, round, and reactive to light. Right eye exhibits no discharge. Left eye exhibits no discharge. No scleral icterus.  Neck: No thyromegaly present.  Cardiovascular: Normal rate, regular rhythm, normal heart sounds and intact distal pulses.  Exam reveals no gallop and no friction rub.   No murmur heard. Pulmonary/Chest: No respiratory distress. She has no wheezes. She has no rales. She exhibits no tenderness.  Abdominal: She exhibits no distension and no mass. There is no tenderness. There is no rebound and no guarding.  Musculoskeletal: She exhibits no edema and no tenderness.       Right knee: Normal.       Left knee: Normal.  Neurological: She is alert and oriented to person, place, and time. She has normal reflexes.  Skin: No rash noted. She is not diaphoretic. No erythema. No pallor.  Psychiatric: She has a normal mood and affect. Her behavior is normal. Judgment normal.          Assessment & Plan:

## 2011-01-05 NOTE — Assessment & Plan Note (Signed)
She is doing well on current meds so I will continue same, she sees Dr. Dub Mikes soon about depression

## 2011-01-07 ENCOUNTER — Encounter (HOSPITAL_BASED_OUTPATIENT_CLINIC_OR_DEPARTMENT_OTHER): Payer: 59 | Admitting: Oncology

## 2011-01-07 DIAGNOSIS — C8111 Nodular sclerosis classical Hodgkin lymphoma, lymph nodes of head, face, and neck: Secondary | ICD-10-CM

## 2011-01-07 DIAGNOSIS — Z452 Encounter for adjustment and management of vascular access device: Secondary | ICD-10-CM

## 2011-01-09 ENCOUNTER — Ambulatory Visit: Payer: 59 | Admitting: Internal Medicine

## 2011-01-22 ENCOUNTER — Telehealth: Payer: Self-pay | Admitting: *Deleted

## 2011-01-22 DIAGNOSIS — G8929 Other chronic pain: Secondary | ICD-10-CM

## 2011-01-22 MED ORDER — OXYCODONE-ACETAMINOPHEN 7.5-325 MG PO TABS: 1.0000 | ORAL_TABLET | ORAL | Status: DC | PRN

## 2011-01-22 NOTE — Telephone Encounter (Signed)
Patient requesting refill on Oxycodone 7.5-325 tid prn #100. (Last filled on 2/27 #80 b/c pharm did not have full 100 to give)

## 2011-01-22 NOTE — Telephone Encounter (Signed)
Patient informed. 

## 2011-01-22 NOTE — Telephone Encounter (Signed)
done

## 2011-01-23 ENCOUNTER — Ambulatory Visit (INDEPENDENT_AMBULATORY_CARE_PROVIDER_SITE_OTHER): Payer: 59 | Admitting: *Deleted

## 2011-01-23 DIAGNOSIS — Z7901 Long term (current) use of anticoagulants: Secondary | ICD-10-CM

## 2011-01-23 DIAGNOSIS — I2699 Other pulmonary embolism without acute cor pulmonale: Secondary | ICD-10-CM

## 2011-01-23 DIAGNOSIS — I82409 Acute embolism and thrombosis of unspecified deep veins of unspecified lower extremity: Secondary | ICD-10-CM

## 2011-01-23 LAB — POCT INR: INR: 2.9

## 2011-01-27 ENCOUNTER — Encounter: Payer: Self-pay | Admitting: Internal Medicine

## 2011-01-27 ENCOUNTER — Ambulatory Visit (INDEPENDENT_AMBULATORY_CARE_PROVIDER_SITE_OTHER): Payer: 59 | Admitting: Internal Medicine

## 2011-01-27 DIAGNOSIS — M1990 Unspecified osteoarthritis, unspecified site: Secondary | ICD-10-CM

## 2011-01-27 DIAGNOSIS — G8929 Other chronic pain: Secondary | ICD-10-CM

## 2011-01-27 DIAGNOSIS — M545 Low back pain: Secondary | ICD-10-CM

## 2011-01-27 MED ORDER — OXYCODONE-ACETAMINOPHEN 5-325 MG PO TABS: 1.0000 | ORAL_TABLET | Freq: Four times a day (QID) | ORAL | Status: DC | PRN

## 2011-01-27 MED ORDER — OXYCODONE HCL 20 MG PO TB12: 20.0000 mg | ORAL_TABLET | Freq: Two times a day (BID) | ORAL | Status: DC | PRN

## 2011-01-27 NOTE — Patient Instructions (Signed)

## 2011-01-28 NOTE — Assessment & Plan Note (Signed)
Continue current meds 

## 2011-01-28 NOTE — Assessment & Plan Note (Signed)
She has less pain so I have changed the dose or percocet to 5 instead of 7.5 and will continue Oxycontin at the current dose

## 2011-01-28 NOTE — Progress Notes (Signed)
  Subjective:    Patient ID: Joyce Payne, female    DOB: 11/07/1960, 50 y.o.   MRN: [id number]  HPI She returns for f/up and tells me that the pain in her right thigh and knee is some better and she wants to lower the dose on the percocet for the "breakthru" pain dosing. She has seen Dr. Dub Mikes about her anxiety and he has added Buspar and she tells me that it is helping.    Review of Systems  Constitutional: Negative for fever, chills, diaphoresis, activity change, appetite change, fatigue and unexpected weight change.  Respiratory: Negative for cough, shortness of breath, wheezing and stridor.   Cardiovascular: Negative for chest pain, palpitations and leg swelling.  Gastrointestinal: Negative for nausea, vomiting, abdominal pain, diarrhea, constipation, blood in stool, abdominal distention and anal bleeding.  Genitourinary: Negative for dysuria, urgency, frequency, hematuria and flank pain.  Musculoskeletal: Positive for back pain and arthralgias. Negative for myalgias, joint swelling and gait problem.  Skin: Negative for color change, pallor and rash.  Neurological: Negative for dizziness, seizures, facial asymmetry, speech difficulty, weakness, light-headedness, numbness and headaches.  Hematological: Negative for adenopathy. Does not bruise/bleed easily.  Psychiatric/Behavioral: Negative for behavioral problems, confusion, self-injury, dysphoric mood and agitation. The patient is not nervous/anxious.        Objective:   Physical Exam  Constitutional: She is oriented to person, place, and time. She appears well-developed and well-nourished. No distress.  HENT:  Head: Normocephalic and atraumatic.  Right Ear: External ear normal.  Left Ear: External ear normal.  Nose: Nose normal.  Mouth/Throat: Oropharynx is clear and moist. No oropharyngeal exudate.  Eyes: Conjunctivae and EOM are normal. Pupils are equal, round, and reactive to light. Left eye exhibits no discharge. No scleral  icterus.  Neck: Normal range of motion. Neck supple. No JVD present. No thyromegaly present.  Cardiovascular: Normal rate, regular rhythm, normal heart sounds and intact distal pulses.  Exam reveals no gallop and no friction rub.   No murmur heard. Pulmonary/Chest: Effort normal and breath sounds normal. No respiratory distress. She has no wheezes. She has no rales. She exhibits no tenderness.  Abdominal: Soft. Bowel sounds are normal. She exhibits no distension and no mass. There is no tenderness. There is no rebound and no guarding.  Musculoskeletal: Normal range of motion. She exhibits no edema and no tenderness.  Lymphadenopathy:    She has no cervical adenopathy.  Neurological: She is alert and oriented to person, place, and time. She has normal reflexes. She exhibits normal muscle tone. Coordination normal.  Skin: Skin is warm and dry. No rash noted. She is not diaphoretic. No erythema. No pallor.  Psychiatric: She has a normal mood and affect. Her behavior is normal. Judgment and thought content normal.          Assessment & Plan:

## 2011-01-28 NOTE — Assessment & Plan Note (Signed)
This is unchanged 

## 2011-02-18 ENCOUNTER — Encounter (HOSPITAL_BASED_OUTPATIENT_CLINIC_OR_DEPARTMENT_OTHER): Payer: 59 | Admitting: Oncology

## 2011-02-18 DIAGNOSIS — C8111 Nodular sclerosis classical Hodgkin lymphoma, lymph nodes of head, face, and neck: Secondary | ICD-10-CM

## 2011-02-18 DIAGNOSIS — Z452 Encounter for adjustment and management of vascular access device: Secondary | ICD-10-CM

## 2011-02-20 ENCOUNTER — Ambulatory Visit (INDEPENDENT_AMBULATORY_CARE_PROVIDER_SITE_OTHER): Payer: 59 | Admitting: *Deleted

## 2011-02-20 DIAGNOSIS — I82409 Acute embolism and thrombosis of unspecified deep veins of unspecified lower extremity: Secondary | ICD-10-CM

## 2011-02-20 DIAGNOSIS — Z7901 Long term (current) use of anticoagulants: Secondary | ICD-10-CM

## 2011-02-20 DIAGNOSIS — I2699 Other pulmonary embolism without acute cor pulmonale: Secondary | ICD-10-CM

## 2011-02-22 ENCOUNTER — Other Ambulatory Visit: Payer: Self-pay | Admitting: Internal Medicine

## 2011-03-13 ENCOUNTER — Ambulatory Visit (INDEPENDENT_AMBULATORY_CARE_PROVIDER_SITE_OTHER): Payer: 59 | Admitting: *Deleted

## 2011-03-13 DIAGNOSIS — Z7901 Long term (current) use of anticoagulants: Secondary | ICD-10-CM

## 2011-03-13 DIAGNOSIS — I82409 Acute embolism and thrombosis of unspecified deep veins of unspecified lower extremity: Secondary | ICD-10-CM

## 2011-03-13 DIAGNOSIS — I2699 Other pulmonary embolism without acute cor pulmonale: Secondary | ICD-10-CM

## 2011-03-18 ENCOUNTER — Ambulatory Visit (INDEPENDENT_AMBULATORY_CARE_PROVIDER_SITE_OTHER): Payer: 59 | Admitting: Internal Medicine

## 2011-03-18 ENCOUNTER — Encounter: Payer: Self-pay | Admitting: Internal Medicine

## 2011-03-18 DIAGNOSIS — M1990 Unspecified osteoarthritis, unspecified site: Secondary | ICD-10-CM

## 2011-03-18 DIAGNOSIS — G8929 Other chronic pain: Secondary | ICD-10-CM

## 2011-03-18 DIAGNOSIS — Z86718 Personal history of other venous thrombosis and embolism: Secondary | ICD-10-CM

## 2011-03-18 DIAGNOSIS — M545 Low back pain, unspecified: Secondary | ICD-10-CM

## 2011-03-18 MED ORDER — OXYCODONE-ACETAMINOPHEN 5-325 MG PO TABS: 1.0000 | ORAL_TABLET | Freq: Four times a day (QID) | ORAL | Status: DC | PRN

## 2011-03-18 MED ORDER — OXYCODONE HCL 20 MG PO TB12: 20.0000 mg | ORAL_TABLET | Freq: Two times a day (BID) | ORAL | Status: DC | PRN

## 2011-03-18 NOTE — Assessment & Plan Note (Signed)
She tells me that she was told by her oncologist that she could stop coumadin after 6 months since her DVT/PE was a complication of lymphoma/chemo so as of today she wills stop

## 2011-03-18 NOTE — Progress Notes (Signed)
  Subjective:    Patient ID: Joyce Payne, female    DOB: Feb 15, 1961, 50 y.o.   MRN: [id number]  HPI She returns for f/up and she tells me that she is doing well and needs a med refill. She is more active over the last few weeks and has had some soreness in her knees but otherwise she feels well.    Review of Systems  Constitutional: Negative for fever, chills, diaphoresis, activity change, appetite change, fatigue and unexpected weight change.  HENT: Negative for facial swelling and neck pain.   Respiratory: Negative for cough, shortness of breath, wheezing and stridor.   Cardiovascular: Negative for chest pain, palpitations and leg swelling.  Gastrointestinal: Negative for nausea, vomiting, abdominal pain, diarrhea, constipation and blood in stool.  Genitourinary: Negative for dysuria, urgency, frequency, hematuria, decreased urine volume, enuresis, difficulty urinating and dyspareunia.  Musculoskeletal: Positive for arthralgias. Negative for myalgias, back pain, joint swelling and gait problem.  Skin: Negative for color change, pallor and rash.  Neurological: Negative for dizziness, tremors, seizures, syncope, facial asymmetry, speech difficulty, weakness, light-headedness, numbness and headaches.  Hematological: Negative for adenopathy. Does not bruise/bleed easily.  Psychiatric/Behavioral: Negative.        Objective:   Physical Exam  [vitalsreviewed. Constitutional: She is oriented to person, place, and time. She appears well-developed and well-nourished. No distress.  HENT:  Head: Normocephalic and atraumatic.  Right Ear: External ear normal.  Left Ear: External ear normal.  Nose: Nose normal.  Mouth/Throat: Oropharynx is clear and moist. No oropharyngeal exudate.  Eyes: Conjunctivae and EOM are normal. Pupils are equal, round, and reactive to light. Right eye exhibits no discharge. Left eye exhibits no discharge. No scleral icterus.  Neck: Normal range of motion. Neck supple.  No JVD present. No tracheal deviation present. No thyromegaly present.  Cardiovascular: Normal rate, regular rhythm, normal heart sounds and intact distal pulses.  Exam reveals no gallop and no friction rub.   No murmur heard. Pulmonary/Chest: Effort normal and breath sounds normal. No stridor. No respiratory distress. She has no wheezes. She has no rales. She exhibits no tenderness.  Abdominal: Soft. Bowel sounds are normal. She exhibits no distension and no mass. There is no tenderness. There is no rebound and no guarding.  Musculoskeletal: Normal range of motion. She exhibits no edema and no tenderness.  Lymphadenopathy:    She has no cervical adenopathy.  Neurological: She is alert and oriented to person, place, and time. She has normal reflexes. She displays normal reflexes. No cranial nerve deficit. She exhibits normal muscle tone. Coordination normal.  Skin: Skin is warm and dry. No rash noted. She is not diaphoretic. No erythema. No pallor.  Psychiatric: She has a normal mood and affect. Her behavior is normal. Judgment and thought content normal.          Assessment & Plan:

## 2011-03-19 NOTE — Patient Instructions (Signed)
Arthritis - Degenerative, Osteoarthritis You have osteoarthritis. This is the wear and tear arthritis that comes with aging. It is also called degenerative arthritis. This is common in people past middle age. It is caused by stress on the joints from living. The large weight bearing joints of the lower extremities are most often affected. The knees, hips, back, neck, and hands can become painful, swollen, and stiff. This is the most common type of arthritis. It comes on with age, carrying too much weight, and from injury. Treatment includes resting the sore joint until the pain and swelling improve. Crutches or a walker may be needed for severe flares. Only take over-the-counter or prescription medicines for pain, discomfort, or fever as directed by your caregiver. Local heat therapy may improve motion. Cortisone shots into the joint are sometimes used to reduce pain and swelling during flares. Osteoarthritis is usually not crippling and progresses slowly. There are things you can do to decrease pain:  Avoid high impact activities.   Exercise regularly.   Low impact exercises such as walking, biking and swimming help to keep the muscles strong and keep normal joint function.   Stretching helps to keep your range of motion.   Lose weight if you are overweight. This reduces joint stress.  In severe cases when you have pain at rest or increasing disability, joint surgery may be helpful. See your caregiver for follow-up treatment as recommended.  SEEK IMMEDIATE MEDICAL CARE IF:  You have severe joint pain.   Marked swelling and redness in your joint develops.   You develop a high fever.  Document Released: 09/28/2005 Document Re-Released: 01/28/2008 [employer] Patient Information 2011 [employer]. 

## 2011-03-19 NOTE — Assessment & Plan Note (Signed)
No changes

## 2011-03-23 ENCOUNTER — Ambulatory Visit: Payer: 59 | Admitting: Internal Medicine

## 2011-03-30 ENCOUNTER — Other Ambulatory Visit: Payer: Self-pay | Admitting: Oncology

## 2011-03-30 ENCOUNTER — Encounter (HOSPITAL_COMMUNITY): Payer: Self-pay

## 2011-03-30 ENCOUNTER — Encounter (HOSPITAL_BASED_OUTPATIENT_CLINIC_OR_DEPARTMENT_OTHER): Payer: 59 | Admitting: Oncology

## 2011-03-30 ENCOUNTER — Ambulatory Visit (HOSPITAL_COMMUNITY)
Admission: RE | Admit: 2011-03-30 | Discharge: 2011-03-30 | Disposition: A | Discharge status: Home / Self Care | Payer: 59 | Source: Ambulatory Visit | Attending: Oncology | Admitting: Oncology

## 2011-03-30 DIAGNOSIS — Z452 Encounter for adjustment and management of vascular access device: Secondary | ICD-10-CM

## 2011-03-30 DIAGNOSIS — C8111 Nodular sclerosis classical Hodgkin lymphoma, lymph nodes of head, face, and neck: Secondary | ICD-10-CM

## 2011-03-30 DIAGNOSIS — K7689 Other specified diseases of liver: Secondary | ICD-10-CM | POA: Insufficient documentation

## 2011-03-30 DIAGNOSIS — C8190 Hodgkin lymphoma, unspecified, unspecified site: Secondary | ICD-10-CM | POA: Insufficient documentation

## 2011-03-30 LAB — CMP (CANCER CENTER ONLY)
AST: 22 U/L (ref 11–38)
Albumin: 3.2 g/dL — ABNORMAL LOW (ref 3.3–5.5)
Alkaline Phosphatase: 58 U/L (ref 26–84)
BUN, Bld: 20 mg/dL (ref 7–22)
Potassium: 3.5 mEq/L (ref 3.3–4.7)
Sodium: 143 mEq/L (ref 128–145)
Total Protein: 5.1 g/dL — ABNORMAL LOW (ref 6.4–8.1)

## 2011-03-30 LAB — CBC WITH DIFFERENTIAL/PLATELET
BASO%: 0.2 % (ref 0.0–2.0)
EOS%: 0.5 % (ref 0.0–7.0)
HCT: 36.9 % (ref 34.8–46.6)
LYMPH%: 16.8 % (ref 14.0–49.7)
MCH: 30.5 pg (ref 25.1–34.0)
MCHC: 34.4 g/dL (ref 31.5–36.0)
MONO%: 7.4 % (ref 0.0–14.0)
NEUT%: 75.1 % (ref 38.4–76.8)
Platelets: 253 10*3/uL (ref 145–400)
RBC: 4.16 10*6/uL (ref 3.70–5.45)
WBC: 5.6 10*3/uL (ref 3.9–10.3)
nRBC: 0 % (ref 0–0)

## 2011-03-30 LAB — MORPHOLOGY: PLT EST: ADEQUATE

## 2011-03-30 MED ORDER — IOHEXOL 300 MG/ML  SOLN
125.0000 mL | Freq: Once | INTRAMUSCULAR | Status: AC | PRN
  Administered 2011-03-30: 125 mL via INTRAVENOUS

## 2011-04-03 ENCOUNTER — Encounter (HOSPITAL_BASED_OUTPATIENT_CLINIC_OR_DEPARTMENT_OTHER): Payer: 59 | Admitting: Oncology

## 2011-04-08 ENCOUNTER — Other Ambulatory Visit: Payer: Self-pay | Admitting: Oncology

## 2011-04-08 DIAGNOSIS — C8190 Hodgkin lymphoma, unspecified, unspecified site: Secondary | ICD-10-CM

## 2011-04-17 ENCOUNTER — Encounter: Payer: 59 | Admitting: *Deleted

## 2011-04-21 ENCOUNTER — Encounter: Payer: Self-pay | Admitting: Oncology

## 2011-04-22 ENCOUNTER — Ambulatory Visit (INDEPENDENT_AMBULATORY_CARE_PROVIDER_SITE_OTHER): Payer: 59 | Admitting: Surgery

## 2011-04-22 ENCOUNTER — Encounter (INDEPENDENT_AMBULATORY_CARE_PROVIDER_SITE_OTHER): Payer: Self-pay | Admitting: Surgery

## 2011-04-22 VITALS — BP 112/80 | HR 84 | Temp 96.8°F | Ht 68.0 in | Wt 215.6 lb

## 2011-04-22 DIAGNOSIS — Z8571 Personal history of Hodgkin lymphoma: Secondary | ICD-10-CM

## 2011-04-22 NOTE — Progress Notes (Signed)
Subjective:     Patient ID: Joyce Payne, female   DOB: April 22, 1961, 50 y.o.   MRN: [id number]    BP 112/80  Pulse 84  Temp(Src) 96.8 F (36 C) (Temporal)  Ht 5\' 8"  (1.727 m)  Wt 215 lb 9.6 oz (97.796 kg)  BMI 32.78 kg/m2    HPI  Diagnosis Hodgkin's lymphoma status post chemotherapy treatment. Currently in remission. Consideration of removal of portacatheter.  Patient is a survivor of [facility] lymphoma. She sent care initially in [place]. She relocated to to [place]. Dr. Cyndie Chime has been caring for her in town. He feels that her disease is stable off chemotherapy. He feels it is a good time to remove the portacatheter.  She initially had a portacatheter placed in on the left side. It had problems with leaks, flipping, not working. It had to be readjusted. Ultimately had to be removed. She had a new one placed on the right IJ tunneled on over onto the right chest. She's not had problems with the port on the right side since that time.  Review of Systems  Constitutional: Negative for fever, chills, diaphoresis, activity change, appetite change, fatigue and unexpected weight change.  HENT: Negative for nosebleeds, sore throat, mouth sores, neck pain and neck stiffness.   Eyes: Negative for photophobia, discharge and visual disturbance.  Respiratory: Negative for cough, choking, chest tightness and shortness of breath.   Cardiovascular: Negative for chest pain and palpitations.  Gastrointestinal: Negative for nausea, vomiting, abdominal pain, diarrhea, constipation, blood in stool, abdominal distention, anal bleeding and rectal pain.  Genitourinary: Negative for dysuria, frequency, flank pain, vaginal bleeding, vaginal discharge and difficulty urinating.  Musculoskeletal: Negative for back pain, arthralgias and gait problem.  Skin: Negative for color change, pallor and rash.  Neurological: Positive for headaches. Negative for dizziness, speech difficulty, weakness and numbness.   Hematological: Negative for adenopathy. Does not bruise/bleed easily.  Psychiatric/Behavioral: Negative for confusion and agitation. The patient is not nervous/anxious.        Objective:   Physical Exam  Constitutional: She is oriented to person, place, and time. She appears well-developed and well-nourished. No distress.       Obese  HENT:  Head: Normocephalic.  Mouth/Throat: Oropharynx is clear and moist. No oropharyngeal exudate.  Eyes: Conjunctivae and EOM are normal. Pupils are equal, round, and reactive to light. No scleral icterus.  Neck: Normal range of motion. Neck supple. No tracheal deviation present.  Cardiovascular: Normal rate, regular rhythm and intact distal pulses.   Pulmonary/Chest: Effort normal and breath sounds normal. No respiratory distress. She exhibits no tenderness.       Old scar Left chest.  Port in right upper medial chest  Abdominal: Soft. She exhibits no distension and no mass. There is no tenderness. Hernia confirmed negative in the right inguinal area and confirmed negative in the left inguinal area.       Well healed upper midline incision  Genitourinary: No vaginal discharge found.  Musculoskeletal: Normal range of motion. She exhibits no tenderness.  Lymphadenopathy:    She has no cervical adenopathy.       Right: No inguinal adenopathy present.       Left: No inguinal adenopathy present.  Neurological: She is alert and oriented to person, place, and time. No cranial nerve deficit. She exhibits normal muscle tone. Coordination normal.  Skin: Skin is warm and dry. No rash noted. She is not diaphoretic. No erythema.  Psychiatric: She has a normal mood and affect. Her behavior  is normal. Judgment and thought content normal.       Assessment:     Survivor of lymphoma, now in remission. Okay to remove portacatheter   Plan:     The technique of removal was discussed. Risks benefits alternatives discussed. I think this can be done in the minor OR  room. Initially I did offer sedation given a problem she had with prior Port-A-Caths.  However because just a removal, and the right port is not have problems, think is reasonable to local only. She wants to keep it simple and feels it is reasonable to do in our office.  We will work toward a convenient time.

## 2011-05-11 ENCOUNTER — Encounter (HOSPITAL_BASED_OUTPATIENT_CLINIC_OR_DEPARTMENT_OTHER): Payer: 59 | Admitting: Oncology

## 2011-05-11 DIAGNOSIS — C8111 Nodular sclerosis classical Hodgkin lymphoma, lymph nodes of head, face, and neck: Secondary | ICD-10-CM

## 2011-05-11 DIAGNOSIS — Z452 Encounter for adjustment and management of vascular access device: Secondary | ICD-10-CM

## 2011-05-21 ENCOUNTER — Ambulatory Visit (INDEPENDENT_AMBULATORY_CARE_PROVIDER_SITE_OTHER): Payer: 59 | Admitting: Surgery

## 2011-05-21 ENCOUNTER — Encounter (INDEPENDENT_AMBULATORY_CARE_PROVIDER_SITE_OTHER): Payer: Self-pay | Admitting: Surgery

## 2011-05-21 DIAGNOSIS — Z8571 Personal history of Hodgkin lymphoma: Secondary | ICD-10-CM

## 2011-05-21 DIAGNOSIS — Z452 Encounter for adjustment and management of vascular access device: Secondary | ICD-10-CM

## 2011-05-21 NOTE — Patient Instructions (Signed)
Sutured Wound Care Your wound has been cleaned and closed with sutures, also called stitches. You should keep the area around your wound clean and dry until the stitches are removed. Rest and elevate the injured area until all the pain and swelling are gone.  HOME CARE INSTRUCTIONS  If a dressing has been applied, it should be removed in 3 days (Sunday).  OK to shower over dressing (it is waterproof)  Avoid stretching a sutured wound.   Cleaning the wound gently with soap and water once daily after 48 hours   Use sunscreen on your wound for the next 3-6 months to reduce darkening of the scar.  SEEK IMMEDIATE MEDICAL ATTENTION IF:  If the wound becomes red, swollen, hot, tender, or pus starts to drain.   If you develop a fever, shaking chills.   Bleeding persists from the sutured wound.

## 2011-05-21 NOTE — Progress Notes (Signed)
Diagnosis:  Hodgkin lymphoma in remission. Request for Port-A-Cath removal.  Oncologist: Dr. Mervin Kung.  Procedure: After informed consent consent was given, the patient was positioned supine. Her right chest and neck were prepped and draped in sterile fashion. I placed a field block with local anesthetic. I made a transverse incision of her prior Port-A-Cath scar in her right upper medial chest.   I bluntly dissected subcutaneous tissues until I located and eviscerated the blue catheter. I used that to help pull on the port on the chest wall.  I used sharp dissection to free the port from its attachments in the subcutaneous tissues and anterior chest wall. I was able to remove the port completely. With that, I was able to pull the catheter out of the neck without any difficulty. Hemostasis is excellent. I inspected the wound and had good hemostasis.  I closed the wound using #4 Monocryl running subcuticular stitches placed an occlusive sterile dressing. Patient tolerated procedure well.  I discussed the post procedure care. She feels comfortable following up p.r.n. I gave her my card and noted a low threshold for close followup and she is other concerns. She expressed understanding and appreciation.

## 2011-06-08 ENCOUNTER — Encounter: Payer: Self-pay | Admitting: Internal Medicine

## 2011-06-08 ENCOUNTER — Ambulatory Visit (INDEPENDENT_AMBULATORY_CARE_PROVIDER_SITE_OTHER): Payer: 59 | Admitting: Internal Medicine

## 2011-06-08 DIAGNOSIS — M545 Low back pain, unspecified: Secondary | ICD-10-CM

## 2011-06-08 DIAGNOSIS — M1990 Unspecified osteoarthritis, unspecified site: Secondary | ICD-10-CM

## 2011-06-08 DIAGNOSIS — Z23 Encounter for immunization: Secondary | ICD-10-CM

## 2011-06-08 DIAGNOSIS — G8929 Other chronic pain: Secondary | ICD-10-CM

## 2011-06-08 DIAGNOSIS — F411 Generalized anxiety disorder: Secondary | ICD-10-CM

## 2011-06-08 MED ORDER — OXYCODONE-ACETAMINOPHEN 5-325 MG PO TABS: 1.0000 | ORAL_TABLET | Freq: Four times a day (QID) | ORAL | Status: DC | PRN

## 2011-06-08 MED ORDER — OXYCODONE HCL 20 MG PO TB12: 20.0000 mg | ORAL_TABLET | Freq: Two times a day (BID) | ORAL | Status: DC | PRN

## 2011-06-08 MED ORDER — LORAZEPAM 1 MG PO TABS: 1.0000 mg | ORAL_TABLET | Freq: Three times a day (TID) | ORAL | Status: DC

## 2011-06-08 NOTE — Assessment & Plan Note (Signed)
Continue current meds 

## 2011-06-08 NOTE — Assessment & Plan Note (Signed)
Continue current meds and f/up with Dr. Dub Mikes

## 2011-06-08 NOTE — Patient Instructions (Signed)
Back Pain (Lumbosacral Strain) Back pain is one of the most common causes of pain. There are many causes of back pain. Most are not serious conditions.  CAUSES Your backbone (spinal column) is made up of 24 main vertebral bodies, the sacrum, and the coccyx. These are held together by muscles and tough, fibrous tissue (ligaments). Nerve roots pass through the openings between the vertebrae. A sudden move or injury to the back may cause injury to, or pressure on, these nerves. This may result in localized back pain or pain movement (radiation) into the buttocks, down the leg, and into the foot. Sharp, shooting pain from the buttock down the back of the leg (sciatica) is frequently associated with a ruptured (herniated) disc. Pain may be caused by muscle spasm alone. Your caregiver can often find the cause of your pain by the details of your symptoms and an exam. In some cases, you may need tests (such as X-rays). Your caregiver will work with you to decide if any tests are needed based on your specific exam. HOME CARE INSTRUCTIONS  Avoid an underactive lifestyle. Active exercise, as directed by your caregiver, is your greatest weapon against back pain.   Avoid hard physical activities (tennis, racquetball, water-skiing) if you are not in proper physical condition for it. This may aggravate and/or create problems.   If you have a back problem, avoid sports requiring sudden body movements. Swimming and walking are generally safer activities.   Maintain good posture.   Avoid becoming overweight (obese).   Use bed rest for only the most extreme, sudden (acute) episode. Your caregiver will help you determine how much bed rest is necessary.   For acute conditions, you may put ice on the injured area.   Put ice in a plastic bag.   Place a towel between your skin and the bag.   Leave the ice on for 20 minutes at a time, every 2 hours, or as needed.   After you are improved and more active, it may  help to apply heat for 30 minutes before activities.  See your caregiver if you are having pain that lasts longer than expected. Your caregiver can advise appropriate exercises and/or therapy if needed. With conditioning, most back problems can be avoided. SEEK IMMEDIATE MEDICAL CARE IF:  You have numbness, tingling, weakness, or problems with the use of your arms or legs.   You experience severe back pain not relieved with medicines.   There is a change in bowel or bladder control.   You have increasing pain in any area of the body, including your belly (abdomen).   You notice shortness of breath, dizziness, or feel faint.   You feel sick to your stomach (nauseous), are throwing up (vomiting), or become sweaty.   You notice discoloration of your toes or legs, or your feet get very cold.   Your back pain is getting worse.   You have an oral temperature above 100.5, not controlled by medicine.  MAKE SURE YOU:   Understand these instructions.   Will watch your condition.   Will get help right away if you are not doing well or get worse.  Document Released: 07/08/2005 Document Re-Released: 12/23/2009 [employer] Patient Information 2011 [employer]. 

## 2011-06-08 NOTE — Progress Notes (Signed)
Subjective:    Patient ID: Joyce Payne, female    DOB: 11-23-1960, 50 y.o.   MRN: [id number]  Back Pain This is a chronic problem. The current episode started more than 1 year ago. The problem occurs intermittently. The problem has been gradually improving since onset. The pain is present in the lumbar spine. The quality of the pain is described as aching. The pain does not radiate. The pain is at a severity of 2/10. The pain is mild. The pain is the same all the time. The symptoms are aggravated by sitting. Stiffness is present in the morning. Associated symptoms include numbness (chronic, right foot for 3 + years s/p surgery). Pertinent negatives include no abdominal pain, bladder incontinence, bowel incontinence, chest pain, dysuria, fever, headaches, leg pain, paresis, paresthesias, pelvic pain, perianal numbness, tingling, weakness or weight loss. She has tried analgesics and NSAIDs for the symptoms. The treatment provided significant relief.      Review of Systems  Constitutional: Negative for fever, chills, weight loss, diaphoresis, activity change, appetite change, fatigue and unexpected weight change.  HENT: Negative.  Negative for sore throat, trouble swallowing and voice change.   Eyes: Negative.   Respiratory: Negative for apnea, cough, choking, chest tightness, shortness of breath, wheezing and stridor.   Cardiovascular: Negative for chest pain.  Gastrointestinal: Negative for nausea, vomiting, abdominal pain, diarrhea, constipation and bowel incontinence.  Genitourinary: Negative for bladder incontinence, dysuria, urgency, frequency, hematuria, flank pain, decreased urine volume, enuresis, difficulty urinating, pelvic pain and dyspareunia.  Musculoskeletal: Positive for back pain and arthralgias (right knee, chronic). Negative for myalgias, joint swelling and gait problem.  Skin: Negative for color change, pallor, rash and wound.  Neurological: Positive for numbness (chronic, right  foot for 3 + years s/p surgery). Negative for dizziness, tingling, tremors, seizures, syncope, facial asymmetry, speech difficulty, weakness, light-headedness, headaches and paresthesias.  Hematological: Negative for adenopathy. Does not bruise/bleed easily.  Psychiatric/Behavioral: Negative for suicidal ideas, hallucinations, behavioral problems, confusion, sleep disturbance, self-injury, dysphoric mood, decreased concentration and agitation. The patient is nervous/anxious. The patient is not hyperactive.        Objective:   Physical Exam  Vitals reviewed. Constitutional: She is oriented to person, place, and time. She appears well-developed and well-nourished. No distress.  HENT:  Head: Normocephalic and atraumatic.  Right Ear: External ear normal.  Left Ear: External ear normal.  Nose: Nose normal.  Mouth/Throat: Oropharynx is clear and moist. No oropharyngeal exudate.  Eyes: Conjunctivae are normal. Right eye exhibits no discharge. Left eye exhibits no discharge. No scleral icterus.  Neck: Normal range of motion. Neck supple. No JVD present. No tracheal deviation present. No thyromegaly present.  Cardiovascular: Normal rate, regular rhythm, normal heart sounds and intact distal pulses.  Exam reveals no gallop and no friction rub.   No murmur heard. Pulmonary/Chest: Effort normal and breath sounds normal. No stridor. No respiratory distress. She has no wheezes. She has no rales. She exhibits no tenderness.  Abdominal: Soft. Bowel sounds are normal. She exhibits no distension and no mass. There is no tenderness. There is no rebound and no guarding.  Musculoskeletal:       Lumbar back: Normal. She exhibits normal range of motion, no tenderness, no bony tenderness, no swelling, no edema, no deformity, no laceration, no pain, no spasm and normal pulse.       Back:       Healed lumbar scar  Lymphadenopathy:    She has no cervical adenopathy.  Neurological: She is alert  and oriented to  person, place, and time. She has normal strength. She displays no atrophy, no tremor and normal reflexes. No cranial nerve deficit or sensory deficit. She exhibits normal muscle tone. She displays a negative Romberg sign. She displays no seizure activity. Coordination and gait normal. She displays no Babinski's sign on the right side. She displays no Babinski's sign on the left side.  Reflex Scores:      Tricep reflexes are 1+ on the right side and 1+ on the left side.      Bicep reflexes are 1+ on the right side and 1+ on the left side.      Brachioradialis reflexes are 1+ on the right side and 1+ on the left side.      Patellar reflexes are 1+ on the right side and 1+ on the left side.      Achilles reflexes are 1+ on the right side. Skin: Skin is warm and dry. No rash noted. She is not diaphoretic. No erythema. No pallor.  Psychiatric: She has a normal mood and affect. Her behavior is normal. Judgment and thought content normal.          Assessment & Plan:

## 2011-08-03 ENCOUNTER — Other Ambulatory Visit: Payer: Self-pay | Admitting: Oncology

## 2011-08-03 ENCOUNTER — Encounter (HOSPITAL_BASED_OUTPATIENT_CLINIC_OR_DEPARTMENT_OTHER): Payer: 59 | Admitting: Oncology

## 2011-08-03 ENCOUNTER — Ambulatory Visit (HOSPITAL_COMMUNITY)
Admission: RE | Admit: 2011-08-03 | Discharge: 2011-08-03 | Disposition: A | Discharge status: Home / Self Care | Payer: 59 | Source: Ambulatory Visit | Attending: Oncology | Admitting: Oncology

## 2011-08-03 DIAGNOSIS — M51379 Other intervertebral disc degeneration, lumbosacral region without mention of lumbar back pain or lower extremity pain: Secondary | ICD-10-CM | POA: Insufficient documentation

## 2011-08-03 DIAGNOSIS — C8190 Hodgkin lymphoma, unspecified, unspecified site: Secondary | ICD-10-CM

## 2011-08-03 DIAGNOSIS — C8589 Other specified types of non-Hodgkin lymphoma, extranodal and solid organ sites: Secondary | ICD-10-CM | POA: Insufficient documentation

## 2011-08-03 DIAGNOSIS — C8111 Nodular sclerosis classical Hodgkin lymphoma, lymph nodes of head, face, and neck: Secondary | ICD-10-CM

## 2011-08-03 DIAGNOSIS — M5137 Other intervertebral disc degeneration, lumbosacral region: Secondary | ICD-10-CM | POA: Insufficient documentation

## 2011-08-03 LAB — CBC WITH DIFFERENTIAL/PLATELET
Basophils Absolute: 0 10*3/uL (ref 0.0–0.1)
EOS%: 0.7 % (ref 0.0–7.0)
HCT: 39 % (ref 34.8–46.6)
HGB: 13.6 g/dL (ref 11.6–15.9)
MCH: 31.9 pg (ref 25.1–34.0)
NEUT%: 73.3 % (ref 38.4–76.8)
lymph#: 1.2 10*3/uL (ref 0.9–3.3)

## 2011-08-03 LAB — CMP (CANCER CENTER ONLY)
BUN, Bld: 21 mg/dL (ref 7–22)
CO2: 29 mEq/L (ref 18–33)
Calcium: 9.4 mg/dL (ref 8.0–10.3)
Chloride: 102 mEq/L (ref 98–108)
Creat: 0.8 mg/dl (ref 0.6–1.2)
Glucose, Bld: 96 mg/dL (ref 73–118)

## 2011-08-03 LAB — SEDIMENTATION RATE: Sed Rate: 4 mm/hr (ref 0–22)

## 2011-08-03 MED ORDER — IOHEXOL 300 MG/ML  SOLN
125.0000 mL | Freq: Once | INTRAMUSCULAR | Status: AC | PRN
  Administered 2011-08-03: 125 mL via INTRAVENOUS

## 2011-08-07 ENCOUNTER — Encounter: Payer: 59 | Admitting: Oncology

## 2011-08-07 ENCOUNTER — Other Ambulatory Visit: Payer: Self-pay | Admitting: Oncology

## 2011-08-12 ENCOUNTER — Ambulatory Visit (INDEPENDENT_AMBULATORY_CARE_PROVIDER_SITE_OTHER): Payer: 59 | Admitting: Internal Medicine

## 2011-08-12 ENCOUNTER — Encounter: Payer: Self-pay | Admitting: Internal Medicine

## 2011-08-12 VITALS — BP 110/72 | HR 80 | Temp 98.4°F | Resp 16 | Wt 210.0 lb

## 2011-08-12 DIAGNOSIS — J019 Acute sinusitis, unspecified: Secondary | ICD-10-CM

## 2011-08-12 MED ORDER — CEFUROXIME AXETIL 500 MG PO TABS: 500.0000 mg | ORAL_TABLET | Freq: Two times a day (BID) | ORAL | Status: AC

## 2011-08-12 NOTE — Assessment & Plan Note (Signed)
Start ceftin and use advil cold and sinus for symptom relief

## 2011-08-12 NOTE — Progress Notes (Signed)
  Subjective:    Patient ID: Joyce Payne, female    DOB: [birth year], 50 y.o.   MRN: [id number]  Sinusitis This is a new problem. Episode onset: 5 days. The problem has been gradually worsening since onset. There has been no fever. Her pain is at a severity of 1/10. The pain is mild. Associated symptoms include chills, sinus pressure and a sore throat. Pertinent negatives include no congestion, coughing, diaphoresis, ear pain, headaches, hoarse voice, neck pain, shortness of breath, sneezing or swollen glands. Past treatments include nothing.      Review of Systems  Constitutional: Positive for chills. Negative for fever, diaphoresis, activity change, appetite change, fatigue and unexpected weight change.  HENT: Positive for sore throat and sinus pressure. Negative for ear pain, congestion, hoarse voice, facial swelling, rhinorrhea, sneezing, trouble swallowing, neck pain, voice change and ear discharge.   Eyes: Negative.   Respiratory: Negative for cough, shortness of breath, wheezing and stridor.   Cardiovascular: Negative for chest pain, palpitations and leg swelling.  Gastrointestinal: Negative.   Genitourinary: Negative for dysuria, urgency, frequency, hematuria, enuresis, difficulty urinating and dyspareunia.  Musculoskeletal: Negative for myalgias, back pain, joint swelling, arthralgias and gait problem.  Neurological: Negative for dizziness, tremors, seizures, syncope, facial asymmetry, speech difficulty, weakness, light-headedness, numbness and headaches.  Hematological: Negative for adenopathy. Does not bruise/bleed easily.  Psychiatric/Behavioral: Negative.        Objective:   Physical Exam  Vitals reviewed. Constitutional: She is oriented to person, place, and time. She appears well-developed and well-nourished. No distress.  HENT:  Head: Normocephalic and atraumatic. No trismus in the jaw.  Right Ear: Hearing, tympanic membrane, external ear and ear canal normal.  Left Ear:  Hearing, tympanic membrane, external ear and ear canal normal.  Nose: Mucosal edema and rhinorrhea present. No nose lacerations, sinus tenderness, nasal deformity, septal deviation or nasal septal hematoma. No epistaxis.  No foreign bodies. Right sinus exhibits maxillary sinus tenderness. Right sinus exhibits no frontal sinus tenderness. Left sinus exhibits maxillary sinus tenderness. Left sinus exhibits no frontal sinus tenderness.  Mouth/Throat: Mucous membranes are normal. Mucous membranes are not pale, not dry and not cyanotic. No uvula swelling. Posterior oropharyngeal erythema present. No oropharyngeal exudate, posterior oropharyngeal edema or tonsillar abscesses.  Eyes: Conjunctivae are normal. Right eye exhibits no discharge. Left eye exhibits no discharge. No scleral icterus.  Neck: Normal range of motion. Neck supple. No JVD present. No tracheal deviation present. No thyromegaly present.  Cardiovascular: Normal rate, regular rhythm, normal heart sounds and intact distal pulses.  Exam reveals no gallop and no friction rub.   No murmur heard. Pulmonary/Chest: Effort normal and breath sounds normal. No stridor. No respiratory distress. She has no wheezes. She has no rales. She exhibits no tenderness.  Abdominal: Soft. Bowel sounds are normal. She exhibits no distension and no mass. There is no tenderness. There is no rebound and no guarding.  Musculoskeletal: Normal range of motion. She exhibits no edema and no tenderness.  Lymphadenopathy:    She has no cervical adenopathy.  Neurological: She is oriented to person, place, and time.  Skin: Skin is warm and dry. No rash noted. She is not diaphoretic. No erythema. No pallor.  Psychiatric: She has a normal mood and affect. Her behavior is normal. Judgment and thought content normal.          Assessment & Plan:

## 2011-08-12 NOTE — Patient Instructions (Signed)

## 2011-08-26 ENCOUNTER — Ambulatory Visit: Payer: 59 | Admitting: Internal Medicine

## 2011-09-09 ENCOUNTER — Encounter: Payer: Self-pay | Admitting: Internal Medicine

## 2011-09-09 ENCOUNTER — Other Ambulatory Visit (INDEPENDENT_AMBULATORY_CARE_PROVIDER_SITE_OTHER): Payer: 59

## 2011-09-09 ENCOUNTER — Ambulatory Visit (INDEPENDENT_AMBULATORY_CARE_PROVIDER_SITE_OTHER): Payer: 59 | Admitting: Internal Medicine

## 2011-09-09 DIAGNOSIS — B373 Candidiasis of vulva and vagina: Secondary | ICD-10-CM

## 2011-09-09 DIAGNOSIS — M545 Low back pain, unspecified: Secondary | ICD-10-CM

## 2011-09-09 DIAGNOSIS — N390 Urinary tract infection, site not specified: Secondary | ICD-10-CM

## 2011-09-09 DIAGNOSIS — B3731 Acute candidiasis of vulva and vagina: Secondary | ICD-10-CM | POA: Insufficient documentation

## 2011-09-09 DIAGNOSIS — G8929 Other chronic pain: Secondary | ICD-10-CM

## 2011-09-09 DIAGNOSIS — M1990 Unspecified osteoarthritis, unspecified site: Secondary | ICD-10-CM

## 2011-09-09 LAB — URINALYSIS, ROUTINE W REFLEX MICROSCOPIC
Ketones, ur: NEGATIVE
Specific Gravity, Urine: <=1.005 (ref 1.000–1.030)
Urine Glucose: NEGATIVE
Urobilinogen, UA: 0.2 (ref 0.0–1.0)

## 2011-09-09 MED ORDER — OXYCODONE HCL 20 MG PO TB12: 20.0000 mg | ORAL_TABLET | Freq: Two times a day (BID) | ORAL | Status: DC | PRN

## 2011-09-09 MED ORDER — OXYCODONE-ACETAMINOPHEN 5-325 MG PO TABS: 1.0000 | ORAL_TABLET | Freq: Four times a day (QID) | ORAL | Status: DC | PRN

## 2011-09-09 MED ORDER — FLUCONAZOLE 150 MG PO TABS: 150.0000 mg | ORAL_TABLET | Freq: Once | ORAL | Status: AC

## 2011-09-09 NOTE — Patient Instructions (Signed)
Back Pain, Adult Low back pain is very common. About 1 in 5 people have back pain.The cause of low back pain is rarely dangerous. The pain often gets better over time.About half of people with a sudden onset of back pain feel better in just 2 weeks. About 8 in 10 people feel better by 6 weeks.  CAUSES Some common causes of back pain include:  Strain of the muscles or ligaments supporting the spine.   Wear and tear (degeneration) of the spinal discs.   Arthritis.   Direct injury to the back.  DIAGNOSIS Most of the time, the direct cause of low back pain is not known.However, back pain can be treated effectively even when the exact cause of the pain is unknown.Answering your caregiver's questions about your overall health and symptoms is one of the most accurate ways to make sure the cause of your pain is not dangerous. If your caregiver needs more information, he or she may order lab work or imaging tests (X-rays or MRIs).However, even if imaging tests show changes in your back, this usually does not require surgery. HOME CARE INSTRUCTIONS For many people, back pain returns.Since low back pain is rarely dangerous, it is often a condition that people can learn to manageon their own.   Remain active. It is stressful on the back to sit or stand in one place. Do not sit, drive, or stand in one place for more than 30 minutes at a time. Take short walks on level surfaces as soon as pain allows.Try to increase the length of time you walk each day.   Do not stay in bed.Resting more than 1 or 2 days can delay your recovery.   Do not avoid exercise or work.Your body is made to move.It is not dangerous to be active, even though your back may hurt.Your back will likely heal faster if you return to being active before your pain is gone.   Pay attention to your body when you bend and lift. Many people have less discomfortwhen lifting if they bend their knees, keep the load close to their  bodies,and avoid twisting. Often, the most comfortable positions are those that put less stress on your recovering back.   Find a comfortable position to sleep. Use a firm mattress and lie on your side with your knees slightly bent. If you lie on your back, put a pillow under your knees.   Only take over-the-counter or prescription medicines as directed by your caregiver. Over-the-counter medicines to reduce pain and inflammation are often the most helpful.Your caregiver may prescribe muscle relaxant drugs.These medicines help dull your pain so you can more quickly return to your normal activities and healthy exercise.   Put ice on the injured area.   Put ice in a plastic bag.   Place a towel between your skin and the bag.   Leave the ice on for 15 to 20 minutes, 3 to 4 times a day for the first 2 to 3 days. After that, ice and heat may be alternated to reduce pain and spasms.   Ask your caregiver about trying back exercises and gentle massage. This may be of some benefit.   Avoid feeling anxious or stressed.Stress increases muscle tension and can worsen back pain.It is important to recognize when you are anxious or stressed and learn ways to manage it.Exercise is a great option.  SEEK MEDICAL CARE IF:  You have pain that is not relieved with rest or medicine.   You have   pain that does not improve in 1 week.   You have new symptoms.   You are generally not feeling well.  SEEK IMMEDIATE MEDICAL CARE IF:   You have pain that radiates from your back into your legs.   You develop new bowel or bladder control problems.   You have unusual weakness or numbness in your arms or legs.   You develop nausea or vomiting.   You develop abdominal pain.   You feel faint.  Document Released: 09/28/2005 Document Revised: 06/10/2011 Document Reviewed: 02/16/2011 [employer] Patient Information 2012 [employer]. 

## 2011-09-09 NOTE — Assessment & Plan Note (Signed)
I will check her UA and urine culture today 

## 2011-09-09 NOTE — Assessment & Plan Note (Signed)
Continue current meds 

## 2011-09-09 NOTE — Progress Notes (Signed)
  Subjective:    Patient ID: Joyce Payne, female    DOB: 08-Mar-1961, 50 y.o.   MRN: [id number]  HPI She returns for f/up and tells me that her low back pain and bilateral knee pain is unchanged. She is getting good relief from the pain with her current meds. All of her URI symptoms have resolved. She has dysuria today.   Review of Systems  Constitutional: Negative for fever, chills, diaphoresis, activity change, appetite change, fatigue and unexpected weight change.  HENT: Negative.   Eyes: Negative.   Respiratory: Negative for cough, shortness of breath, wheezing and stridor.   Cardiovascular: Negative for chest pain, palpitations and leg swelling.  Gastrointestinal: Negative for nausea, vomiting, abdominal pain, diarrhea and constipation.  Genitourinary: Positive for dysuria and frequency. Negative for urgency, hematuria, flank pain, decreased urine volume, vaginal bleeding, vaginal discharge, enuresis, difficulty urinating, vaginal pain, menstrual problem, pelvic pain and dyspareunia.  Musculoskeletal: Positive for back pain and arthralgias. Negative for myalgias, joint swelling and gait problem.  Skin: Negative for color change, pallor, rash and wound.  Neurological: Negative for dizziness, tremors, seizures, syncope, facial asymmetry, speech difficulty, weakness, light-headedness, numbness and headaches.  Hematological: Negative for adenopathy. Does not bruise/bleed easily.  Psychiatric/Behavioral: Negative.        Objective:   Physical Exam  Vitals reviewed. Constitutional: She is oriented to person, place, and time. She appears well-developed and well-nourished. No distress.  HENT:  Head: Normocephalic and atraumatic.  Mouth/Throat: Oropharynx is clear and moist. No oropharyngeal exudate.  Eyes: Conjunctivae are normal. Right eye exhibits no discharge. Left eye exhibits no discharge. No scleral icterus.  Neck: Normal range of motion. Neck supple. No JVD present. No tracheal  deviation present. No thyromegaly present.  Cardiovascular: Normal rate, regular rhythm, normal heart sounds and intact distal pulses.  Exam reveals no gallop and no friction rub.   No murmur heard. Pulmonary/Chest: Effort normal and breath sounds normal. No stridor. No respiratory distress. She has no wheezes. She has no rales. She exhibits no tenderness.  Abdominal: Soft. Bowel sounds are normal. She exhibits no distension. There is no tenderness. There is no rebound and no guarding.  Musculoskeletal: Normal range of motion. She exhibits no edema and no tenderness.  Lymphadenopathy:    She has no cervical adenopathy.  Neurological: She is oriented to person, place, and time.  Skin: Skin is warm and dry. No rash noted. She is not diaphoretic. No erythema. No pallor.  Psychiatric: She has a normal mood and affect. Her behavior is normal. Judgment and thought content normal.          Assessment & Plan:

## 2011-09-09 NOTE — Progress Notes (Signed)
Addended by: Etta Grandchild on: 09/09/2011 04:34 PM   Modules accepted: Orders

## 2011-09-11 LAB — CULTURE, URINE COMPREHENSIVE: Organism ID, Bacteria: NO GROWTH

## 2011-10-16 ENCOUNTER — Encounter: Payer: Self-pay | Admitting: Internal Medicine

## 2011-10-16 ENCOUNTER — Ambulatory Visit (INDEPENDENT_AMBULATORY_CARE_PROVIDER_SITE_OTHER): Payer: 59 | Admitting: Internal Medicine

## 2011-10-16 ENCOUNTER — Ambulatory Visit (INDEPENDENT_AMBULATORY_CARE_PROVIDER_SITE_OTHER)
Admission: RE | Admit: 2011-10-16 | Discharge: 2011-10-16 | Disposition: A | Discharge status: Home / Self Care | Payer: 59 | Source: Ambulatory Visit | Attending: Internal Medicine | Admitting: Internal Medicine

## 2011-10-16 VITALS — BP 110/76 | HR 80 | Temp 98.8°F

## 2011-10-16 DIAGNOSIS — M25512 Pain in left shoulder: Secondary | ICD-10-CM

## 2011-10-16 DIAGNOSIS — M7580 Other shoulder lesions, unspecified shoulder: Secondary | ICD-10-CM

## 2011-10-16 DIAGNOSIS — M25519 Pain in unspecified shoulder: Secondary | ICD-10-CM

## 2011-10-16 DIAGNOSIS — J019 Acute sinusitis, unspecified: Secondary | ICD-10-CM

## 2011-10-16 DIAGNOSIS — M719 Bursopathy, unspecified: Secondary | ICD-10-CM

## 2011-10-16 DIAGNOSIS — M67919 Unspecified disorder of synovium and tendon, unspecified shoulder: Secondary | ICD-10-CM

## 2011-10-16 MED ORDER — AMOXICILLIN-POT CLAVULANATE 500-125 MG PO TABS: 1.0000 | ORAL_TABLET | Freq: Three times a day (TID) | ORAL | Status: AC

## 2011-10-16 MED ORDER — NAPROXEN 500 MG PO TABS: 500.0000 mg | ORAL_TABLET | Freq: Two times a day (BID) | ORAL | Status: DC

## 2011-10-16 NOTE — Patient Instructions (Signed)
Rotator Cuff Tendonitis  The rotator cuff is the collection of all the muscles and tendons (the supraspinatus, infraspinatus, subscapularis, and teres minor muscles and their tendons) that help your shoulder stay in place. This unit holds the head of the upper arm bone (humerus) in the cup (fossa) of the shoulder blade (scapula). Basically, it connects the arm to the shoulder. Tendinitis is a swelling and irritation of the tissue, called cord like structures (tendons) that connect muscle to bone. It usually is caused by overusing the joint involved. When the tissue surrounding a tendon (the synovium) becomes inflamed, it is called tenosynovitis. This also is often the result of overuse in people whose jobs require repetitive (over and over again) types of motion. HOME CARE INSTRUCTIONS   Use a sling or splint for as long as directed by your caregiver until the pain decreases.   Apply ice to the injury for 15 to 20 minutes, 3 to 4 times per day. Put the ice in a plastic bag and place a towel between the bag of ice and your skin.   Try to avoid use other than gentle range of motion while your shoulder is painful. Use and exercise only as directed by your caregiver. Stop exercises or range of motion if pain or discomfort increases, unless directed otherwise by your caregiver.   Only take over-the-counter or prescription medicines for pain, discomfort, or fever as directed by your caregiver.   If you were give a shoulder sling and straps (immobilizer), do not remove it except as directed, or until you see a caregiver for a follow-up examination. If you need to remove it, move your arm as little as possible or as directed.   You may want to sleep on several pillows at night to lessen swelling and pain.  SEEK IMMEDIATE MEDICAL CARE IF:   Pain in your shoulder increases or new pain develops in your arm, hand, or fingers and is not relieved with medications.   You develop new, unexplained symptoms,  especially increased numbness in the hands or loss of strength, or you develop any worsening of the problems which brought you in for care.   Your arm, hand, or fingers are numb or tingling.   Your arm, hand, or fingers are swollen, painful, or turn white or blue.  Document Released: 12/19/2003 Document Revised: 06/10/2011 Document Reviewed: 07/26/2008 [facility] Patient Information 2012 [place], [place].Sinusitis Sinuses are air pockets within the bones of your face. The growth of bacteria within a sinus leads to infection. The infection prevents the sinuses from draining. This infection is called sinusitis. SYMPTOMS  There will be different areas of pain depending on which sinuses have become infected.  The maxillary sinuses often produce pain beneath the eyes.   Frontal sinusitis may cause pain in the middle of the forehead and above the eyes.  Other problems (symptoms) include:  Toothaches.   Colored, pus-like (purulent) drainage from the nose.   Swelling, warmth, and tenderness over the sinus areas may be signs of infection.  TREATMENT  Sinusitis is most often determined by an exam.X-rays may be taken. If x-rays have been taken, make sure you obtain your results or find out how you are to obtain them. Your caregiver may give you medications (antibiotics). These are medications that will help kill the bacteria causing the infection. You may also be given a medication (decongestant) that helps to reduce sinus swelling.  HOME CARE INSTRUCTIONS   Only take over-the-counter or prescription medicines for pain, discomfort, or fever  as directed by your caregiver.   Drink extra fluids. Fluids help thin the mucus so your sinuses can drain more easily.   Applying either moist heat or ice packs to the sinus areas may help relieve discomfort.   Use saline nasal sprays to help moisten your sinuses. The sprays can be found at your local drugstore.  SEEK IMMEDIATE MEDICAL CARE IF:  You have a  fever.   You have increasing pain, severe headaches, or toothache.   You have nausea, vomiting, or drowsiness.   You develop unusual swelling around the face or trouble seeing.  MAKE SURE YOU:   Understand these instructions.   Will watch your condition.   Will get help right away if you are not doing well or get worse.  Document Released: 09/28/2005 Document Revised: 06/10/2011 Document Reviewed: 04/27/2007 [facility] Patient Information 2012 [place], [place].

## 2011-10-16 NOTE — Progress Notes (Signed)
Subjective:    Patient ID: Joyce Payne, female    DOB: 1961/09/12, 51 y.o.   MRN: [id number]  Sinusitis This is a new problem. Episode onset: for 2 weeks. The problem has been gradually worsening since onset. There has been no fever. The fever has been present for less than 1 day. She is experiencing no pain. Associated symptoms include chills, congestion, ear pain, sinus pressure and sneezing. Pertinent negatives include no coughing, diaphoresis, headaches, hoarse voice, neck pain, shortness of breath, sore throat or swollen glands. Past treatments include nothing.  Shoulder Pain  The pain is present in the left shoulder. This is a chronic problem. The current episode started more than 1 month ago. The problem occurs constantly. The problem has been gradually worsening. The quality of the pain is described as aching. The pain is at a severity of 4/10. The pain is moderate. Associated symptoms include a limited range of motion. Pertinent negatives include no fever, inability to bear weight, itching, joint locking, joint swelling, numbness, stiffness or tingling. The symptoms are aggravated by lying down and activity. She has tried acetaminophen and oral narcotics for the symptoms. The treatment provided mild relief. Her past medical history is significant for osteoarthritis.      Review of Systems  Constitutional: Positive for chills. Negative for fever, diaphoresis, activity change, appetite change, fatigue and unexpected weight change.  HENT: Positive for ear pain, congestion, sneezing and sinus pressure. Negative for hearing loss, nosebleeds, sore throat, hoarse voice, rhinorrhea, trouble swallowing, neck pain, neck stiffness, voice change, postnasal drip, tinnitus and ear discharge.   Eyes: Negative.   Respiratory: Negative for apnea, cough, choking, chest tightness, shortness of breath, wheezing and stridor.   Cardiovascular: Negative for chest pain, palpitations and leg swelling.    Gastrointestinal: Negative.   Genitourinary: Negative.   Musculoskeletal: Positive for arthralgias. Negative for myalgias, back pain, joint swelling, gait problem and stiffness.  Skin: Negative.  Negative for itching.  Neurological: Negative for dizziness, tingling, tremors, seizures, syncope, facial asymmetry, speech difficulty, weakness, light-headedness, numbness and headaches.  Hematological: Negative for adenopathy. Does not bruise/bleed easily.  Psychiatric/Behavioral: Negative.        Objective:   Physical Exam  Constitutional: She is oriented to person, place, and time. She appears well-developed and well-nourished. No distress.  HENT:  Head: No trismus in the jaw.  Right Ear: Hearing, tympanic membrane, external ear and ear canal normal.  Left Ear: Hearing, external ear and ear canal normal. No drainage, swelling or tenderness. No foreign bodies. No mastoid tenderness. Tympanic membrane is injected and erythematous. Tympanic membrane is not scarred, not perforated and not retracted.  No middle ear effusion. No hemotympanum. No decreased hearing is noted.  Nose: No mucosal edema, rhinorrhea, nose lacerations, sinus tenderness, nasal deformity, septal deviation or nasal septal hematoma. No epistaxis.  No foreign bodies. Right sinus exhibits maxillary sinus tenderness. Right sinus exhibits no frontal sinus tenderness. Left sinus exhibits maxillary sinus tenderness. Left sinus exhibits no frontal sinus tenderness.  Mouth/Throat: Mucous membranes are not pale, not dry and not cyanotic. No uvula swelling. No oropharyngeal exudate, posterior oropharyngeal edema, posterior oropharyngeal erythema or tonsillar abscesses.  Eyes: Conjunctivae are normal. Right eye exhibits no discharge. Left eye exhibits no discharge. No scleral icterus.  Neck: Normal range of motion. Neck supple. No JVD present. No tracheal deviation present. No thyromegaly present.  Cardiovascular: Normal rate, regular rhythm,  normal heart sounds and intact distal pulses.  Exam reveals no gallop and no friction rub.  No murmur heard. Pulmonary/Chest: Effort normal and breath sounds normal. No stridor. No respiratory distress. She has no wheezes. She has no rales. She exhibits no tenderness.  Abdominal: Soft. Bowel sounds are normal. She exhibits no distension and no mass. There is no tenderness. There is no rebound and no guarding.  Musculoskeletal: She exhibits no edema and no tenderness.       Left shoulder: She exhibits decreased range of motion and pain. She exhibits no tenderness, no bony tenderness, no swelling, no effusion, no crepitus, no deformity, no laceration, no spasm, normal pulse and normal strength.  Lymphadenopathy:    She has no cervical adenopathy.  Neurological: She is oriented to person, place, and time.  Skin: Skin is warm and dry. No rash noted. She is not diaphoretic. No erythema. No pallor.  Psychiatric: She has a normal mood and affect. Her behavior is normal. Judgment and thought content normal.          Assessment & Plan:

## 2011-10-16 NOTE — Assessment & Plan Note (Signed)
Start augmentin for the infection 

## 2011-10-16 NOTE — Assessment & Plan Note (Signed)
Check a plain xray and start nsaids

## 2011-10-16 NOTE — Assessment & Plan Note (Signed)
Start nsaids, pt ed, and ortho referral

## 2011-12-07 ENCOUNTER — Encounter: Payer: Self-pay | Admitting: Internal Medicine

## 2011-12-07 ENCOUNTER — Ambulatory Visit (INDEPENDENT_AMBULATORY_CARE_PROVIDER_SITE_OTHER): Payer: 59 | Admitting: Internal Medicine

## 2011-12-07 DIAGNOSIS — M25519 Pain in unspecified shoulder: Secondary | ICD-10-CM

## 2011-12-07 DIAGNOSIS — M67919 Unspecified disorder of synovium and tendon, unspecified shoulder: Secondary | ICD-10-CM

## 2011-12-07 DIAGNOSIS — M7580 Other shoulder lesions, unspecified shoulder: Secondary | ICD-10-CM

## 2011-12-07 DIAGNOSIS — M1990 Unspecified osteoarthritis, unspecified site: Secondary | ICD-10-CM

## 2011-12-07 DIAGNOSIS — M25512 Pain in left shoulder: Secondary | ICD-10-CM

## 2011-12-07 DIAGNOSIS — G8929 Other chronic pain: Secondary | ICD-10-CM

## 2011-12-07 DIAGNOSIS — M545 Low back pain: Secondary | ICD-10-CM

## 2011-12-07 MED ORDER — OXYCODONE HCL 40 MG PO TB12: 40.0000 mg | ORAL_TABLET | Freq: Two times a day (BID) | ORAL | Status: DC

## 2011-12-07 MED ORDER — OXYCODONE-ACETAMINOPHEN 5-325 MG PO TABS: 1.0000 | ORAL_TABLET | Freq: Four times a day (QID) | ORAL | Status: DC | PRN

## 2011-12-07 NOTE — Assessment & Plan Note (Signed)
F/up with ortho

## 2011-12-07 NOTE — Progress Notes (Signed)
  Subjective:    Patient ID: Joyce Payne, female    DOB: Jun 16, 1961, 51 y.o.   MRN: [id number]  HPI  She returns and asks for an upward adjustment in her pain meds b/c her left shoulder is in bad shape, she has seen Dr. 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 She needs surgery but for now will need meds to do PT and control the pain.  Review of Systems  Constitutional: Negative for fever, chills, diaphoresis, activity change, appetite change, fatigue and unexpected weight change.  HENT: Negative.   Eyes: Negative.   Respiratory: Negative for apnea, cough, choking, chest tightness, shortness of breath, wheezing and stridor.   Cardiovascular: Negative for chest pain, palpitations and leg swelling.  Gastrointestinal: Negative.   Genitourinary: Negative.   Musculoskeletal: Positive for arthralgias. Negative for myalgias, back pain, joint swelling and gait problem.  Skin: Negative.   Neurological: Negative for dizziness, tremors, seizures, syncope, facial asymmetry, speech difficulty, weakness, light-headedness, numbness and headaches.  Hematological: Negative for adenopathy. Does not bruise/bleed easily.  Psychiatric/Behavioral: Negative.        Objective:   Physical Exam  Vitals reviewed. Constitutional: She is oriented to person, place, and time. She appears well-developed and well-nourished. No distress.  HENT:  Head: Atraumatic.  Mouth/Throat: Oropharynx is clear and moist. No oropharyngeal exudate.  Eyes: Conjunctivae are normal. Right eye exhibits no discharge. Left eye exhibits no discharge. No scleral icterus.  Neck: Normal range of motion. Neck supple. No JVD present. No tracheal deviation present. No thyromegaly present.  Cardiovascular: Normal rate, regular rhythm, normal heart sounds and intact distal pulses.  Exam reveals no gallop and no friction rub.   No murmur heard. Pulmonary/Chest: Effort normal and breath sounds normal.  No stridor. No respiratory distress. She has no wheezes. She has no rales. She exhibits no tenderness.  Abdominal: Soft. Bowel sounds are normal. She exhibits no distension. There is no tenderness. There is no rebound and no guarding.  Musculoskeletal: She exhibits no edema and no tenderness.       Left shoulder: She exhibits decreased range of motion and tenderness. She exhibits no bony tenderness, no swelling, no effusion, no crepitus, no deformity, no laceration, no pain, no spasm, normal pulse and normal strength.  Lymphadenopathy:    She has no cervical adenopathy.  Neurological: She is oriented to person, place, and time.  Skin: Skin is warm and dry. No rash noted. She is not diaphoretic. No erythema. No pallor.  Psychiatric: She has a normal mood and affect. Her behavior is normal. Judgment and thought content normal.          Assessment & Plan:

## 2011-12-07 NOTE — Assessment & Plan Note (Signed)
celebrex did not help much so I asked her to restart naproxen, I have increased the dose on her oxycontin, she'll continue prn percocet

## 2011-12-07 NOTE — Assessment & Plan Note (Signed)
No changes

## 2011-12-07 NOTE — Patient Instructions (Signed)
Osteoarthritis Osteoarthritis is the most common form of arthritis. It is redness, soreness, and swelling (inflammation) affecting the cartilage. Cartilage acts as a cushion, covering the ends of bones where they meet to form a joint. CAUSES  Over time, the cartilage begins to wear away. This causes bone to rub on bone. This produces pain and stiffness in the affected joints. Factors that contribute to this problem are:  Excessive body weight.   Age.   Overuse of joints.  SYMPTOMS   People with osteoarthritis usually experience joint pain, swelling, or stiffness.   Over time, the joint may lose its normal shape.   Small deposits of bone (osteophytes) may grow on the edges of the joint.   Bits of bone or cartilage can break off and float inside the joint space. This may cause more pain and damage.   Osteoarthritis can lead to depression, anxiety, feelings of helplessness, and limitations on daily activities.  The most commonly affected joints are in the:  Ends of the fingers.   Thumbs.   Neck.   Lower back.   Knees.   Hips.  DIAGNOSIS  Diagnosis is mostly based on your symptoms and exam. Tests may be helpful, including:  X-rays of the affected joint.   A computerized magnetic scan (MRI).   Blood tests to rule out other types of arthritis.   Joint fluid tests. This involves using a needle to draw fluid from the joint and examining the fluid under a microscope.  TREATMENT  Goals of treatment are to control pain, improve joint function, maintain a normal body weight, and maintain a healthy lifestyle. Treatment approaches may include:  A prescribed exercise program with rest and joint relief.   Weight control with nutritional education.   Pain relief techniques such as:   Properly applied heat and cold.   Electric pulses delivered to nerve endings under the skin (transcutaneous electrical nerve stimulation, TENS).   Massage.   Certain supplements. Ask your  caregiver before using any supplements, especially in combination with prescribed drugs.   Medicines to control pain, such as:   Acetaminophen.   Nonsteroidal anti-inflammatory drugs (NSAIDs), such as naproxen.   Narcotic or central-acting agents, such as tramadol. This drug carries a risk of addiction and is generally prescribed for short-term use.   Corticosteroids. These can be given orally or as injection. This is a short-term treatment, not recommended for routine use.   Surgery to reposition the bones and relieve pain (osteotomy) or to remove loose pieces of bone and cartilage. Joint replacement may be needed in advanced states of osteoarthritis.  HOME CARE INSTRUCTIONS  Your caregiver can recommend specific types of exercise. These may include:  Strengthening exercises. These are done to strengthen the muscles that support joints affected by arthritis. They can be performed with weights or with exercise bands to add resistance.   Aerobic activities. These are exercises, such as brisk walking or low-impact aerobics, that get your heart pumping. They can help keep your lungs and circulatory system in shape.   Range-of-motion activities. These keep your joints limber.   Balance and agility exercises. These help you maintain daily living skills.  Learning about your condition and being actively involved in your care will help improve the course of your osteoarthritis. SEEK MEDICAL CARE IF:   You feel hot or your skin turns red.   You develop a rash in addition to your joint pain.   You have an oral temperature above 102 F (38.9 C).  FOR   MORE INFORMATION  National Institute of Arthritis and Musculoskeletal and Skin Diseases: www.niams.nih.gov National Institute on Aging: www.nia.nih.gov American College of Rheumatology: www.rheumatology.org Document Released: 09/28/2005 Document Revised: 06/10/2011 Document Reviewed: 01/09/2010 [employer] Patient Information 2012 [employer]. 

## 2011-12-14 ENCOUNTER — Telehealth: Payer: Self-pay | Admitting: *Deleted

## 2011-12-14 DIAGNOSIS — G8929 Other chronic pain: Secondary | ICD-10-CM

## 2011-12-14 DIAGNOSIS — M1990 Unspecified osteoarthritis, unspecified site: Secondary | ICD-10-CM

## 2011-12-14 DIAGNOSIS — M545 Low back pain: Secondary | ICD-10-CM

## 2011-12-14 DIAGNOSIS — M7580 Other shoulder lesions, unspecified shoulder: Secondary | ICD-10-CM

## 2011-12-14 NOTE — Telephone Encounter (Signed)
Ok, will you print them

## 2011-12-14 NOTE — Telephone Encounter (Signed)
Pt is calling requesting 2 mo supply of Roxicet 5/325 mg be mailed to her home. She states Dr. Yetta Barre told her he would mail them at her last OV.

## 2011-12-15 MED ORDER — OXYCODONE-ACETAMINOPHEN 5-325 MG PO TABS: 1.0000 | ORAL_TABLET | Freq: Four times a day (QID) | ORAL | Status: DC | PRN

## 2011-12-15 NOTE — Telephone Encounter (Signed)
RX mailed.

## 2011-12-21 ENCOUNTER — Ambulatory Visit: Payer: 59 | Admitting: Internal Medicine

## 2011-12-28 ENCOUNTER — Telehealth: Payer: Self-pay | Admitting: Oncology

## 2011-12-28 NOTE — Telephone Encounter (Signed)
lmonvm adviisng the pt of her r/s md appt from 02/12/2012 to 02/15/2012 due to the md is out of the office on 02/12/2012

## 2011-12-30 ENCOUNTER — Other Ambulatory Visit: Payer: Self-pay | Admitting: Otolaryngology

## 2012-01-07 ENCOUNTER — Encounter (HOSPITAL_BASED_OUTPATIENT_CLINIC_OR_DEPARTMENT_OTHER): Payer: Self-pay | Admitting: *Deleted

## 2012-01-11 ENCOUNTER — Encounter (HOSPITAL_BASED_OUTPATIENT_CLINIC_OR_DEPARTMENT_OTHER): Payer: Self-pay | Admitting: *Deleted

## 2012-01-14 ENCOUNTER — Encounter: Payer: Self-pay | Admitting: Internal Medicine

## 2012-01-14 ENCOUNTER — Ambulatory Visit (INDEPENDENT_AMBULATORY_CARE_PROVIDER_SITE_OTHER): Payer: 59 | Admitting: Internal Medicine

## 2012-01-14 VITALS — BP 124/76 | HR 88 | Temp 97.2°F | Wt 220.0 lb

## 2012-01-14 DIAGNOSIS — F411 Generalized anxiety disorder: Secondary | ICD-10-CM

## 2012-01-14 DIAGNOSIS — F329 Major depressive disorder, single episode, unspecified: Secondary | ICD-10-CM

## 2012-01-14 DIAGNOSIS — J309 Allergic rhinitis, unspecified: Secondary | ICD-10-CM

## 2012-01-14 DIAGNOSIS — M1990 Unspecified osteoarthritis, unspecified site: Secondary | ICD-10-CM

## 2012-01-14 DIAGNOSIS — J019 Acute sinusitis, unspecified: Secondary | ICD-10-CM | POA: Insufficient documentation

## 2012-01-14 MED ORDER — LORAZEPAM 1 MG PO TABS: 1.0000 mg | ORAL_TABLET | Freq: Three times a day (TID) | ORAL | Status: DC

## 2012-01-14 MED ORDER — ARIPIPRAZOLE 2 MG PO TABS: 4.0000 mg | ORAL_TABLET | Freq: Every day | ORAL | Status: DC

## 2012-01-14 MED ORDER — FEXOFENADINE-PSEUDOEPHED ER 180-240 MG PO TB24: 1.0000 | ORAL_TABLET | Freq: Every day | ORAL | Status: DC

## 2012-01-14 MED ORDER — AMOXICILLIN 875 MG PO TABS: 875.0000 mg | ORAL_TABLET | Freq: Two times a day (BID) | ORAL | Status: AC

## 2012-01-14 MED ORDER — BECLOMETHASONE DIPROPIONATE 80 MCG/ACT NA AERS: 2.0000 | INHALATION_SPRAY | Freq: Every day | NASAL | Status: DC

## 2012-01-14 NOTE — Patient Instructions (Signed)

## 2012-01-14 NOTE — Assessment & Plan Note (Signed)
Her Psych is not available so I prescribed abilify for her

## 2012-01-14 NOTE — Assessment & Plan Note (Signed)
Unchanged, continue the current meds

## 2012-01-14 NOTE — Progress Notes (Signed)
Subjective:    Patient ID: Joyce Payne, female    DOB: [birth year], 51 y.o.   MRN: [id number]  Sinusitis This is a recurrent problem. The current episode started 1 to 4 weeks ago. The problem has been gradually worsening since onset. There has been no fever. Her pain is at a severity of 0/10. She is experiencing no pain. Associated symptoms include chills, congestion, sinus pressure and sneezing. Pertinent negatives include no coughing, diaphoresis, ear pain, headaches, hoarse voice, neck pain, shortness of breath, sore throat or swollen glands. Past treatments include nothing.      Review of Systems  Constitutional: Positive for chills. Negative for fever, diaphoresis, activity change, appetite change, fatigue and unexpected weight change.  HENT: Positive for congestion, rhinorrhea, sneezing, postnasal drip and sinus pressure. Negative for hearing loss, ear pain, nosebleeds, sore throat, hoarse voice, facial swelling, mouth sores, trouble swallowing, neck pain, neck stiffness, dental problem, voice change, tinnitus and ear discharge.   Eyes: Negative.   Respiratory: Negative for apnea, cough, choking, chest tightness, shortness of breath, wheezing and stridor.   Cardiovascular: Negative for chest pain, palpitations and leg swelling.  Gastrointestinal: Negative for nausea, vomiting, abdominal pain, diarrhea, constipation, blood in stool and abdominal distention.  Genitourinary: Negative for dysuria, frequency, hematuria, flank pain, enuresis, difficulty urinating and dyspareunia.  Musculoskeletal: Positive for arthralgias (left shoulder, both knees). Negative for myalgias, back pain, joint swelling and gait problem.  Skin: Negative for color change, pallor, rash and wound.  Neurological: Negative for dizziness, tremors, seizures, syncope, facial asymmetry, speech difficulty, weakness, light-headedness, numbness and headaches.  Hematological: Negative for adenopathy. Does not bruise/bleed easily.   Psychiatric/Behavioral: Positive for sleep disturbance and dysphoric mood. Negative for suicidal ideas, hallucinations, behavioral problems, confusion, self-injury, decreased concentration and agitation. The patient is nervous/anxious. The patient is not hyperactive.        Objective:   Physical Exam  Vitals reviewed. Constitutional: She is oriented to person, place, and time. She appears well-developed and well-nourished. No distress.  HENT:  Head: No trismus in the jaw.  Right Ear: Hearing, tympanic membrane, external ear and ear canal normal.  Left Ear: Hearing, tympanic membrane, external ear and ear canal normal.  Nose: Mucosal edema and rhinorrhea present. No nose lacerations, sinus tenderness, nasal deformity, septal deviation or nasal septal hematoma. No epistaxis.  No foreign bodies. Right sinus exhibits no maxillary sinus tenderness and no frontal sinus tenderness. Left sinus exhibits no maxillary sinus tenderness and no frontal sinus tenderness.  Mouth/Throat: Oropharynx is clear and moist and mucous membranes are normal. Mucous membranes are not pale, not dry and not cyanotic. No uvula swelling. No oropharyngeal exudate, posterior oropharyngeal erythema or tonsillar abscesses.  Eyes: Conjunctivae are normal. Right eye exhibits no discharge. Left eye exhibits no discharge. No scleral icterus.  Neck: Normal range of motion. Neck supple. No JVD present. No tracheal deviation present. No thyromegaly present.  Cardiovascular: Normal rate, regular rhythm, normal heart sounds and intact distal pulses.  Exam reveals no gallop and no friction rub.   No murmur heard. Pulmonary/Chest: Effort normal and breath sounds normal. No stridor. No respiratory distress. She has no wheezes. She has no rales. She exhibits no tenderness.  Abdominal: Soft. Bowel sounds are normal. She exhibits no distension and no mass. There is no tenderness. There is no rebound and no guarding.  Musculoskeletal: Normal  range of motion. She exhibits no edema and no tenderness.  Lymphadenopathy:    She has no cervical adenopathy.  Neurological: She is oriented  to person, place, and time.  Skin: Skin is warm and dry. No rash noted. She is not diaphoretic. No erythema. No pallor.  Psychiatric: She has a normal mood and affect. Her behavior is normal. Judgment and thought content normal. Her mood appears not anxious. Her affect is not angry, not blunt, not labile and not inappropriate. Her speech is not rapid and/or pressured, not delayed, not tangential and not slurred. She is not agitated, not aggressive, is not hyperactive, not slowed, not withdrawn, not actively hallucinating and not combative. Thought content is not paranoid and not delusional. Cognition and memory are normal. She does not express impulsivity or inappropriate judgment. She does not exhibit a depressed mood. She expresses no homicidal and no suicidal ideation. She expresses no suicidal plans and no homicidal plans. She is communicative. She is attentive.          Assessment & Plan:

## 2012-01-14 NOTE — Assessment & Plan Note (Signed)
Start Qnasl and allegra-d

## 2012-01-14 NOTE — Assessment & Plan Note (Signed)
Start amoxil for the infection 

## 2012-01-14 NOTE — Assessment & Plan Note (Signed)
Her Psych is not available so I prescribed the ativan for her

## 2012-01-15 ENCOUNTER — Encounter (HOSPITAL_BASED_OUTPATIENT_CLINIC_OR_DEPARTMENT_OTHER): Payer: Self-pay | Admitting: *Deleted

## 2012-01-15 NOTE — Progress Notes (Signed)
NPO AFTER MN. ARRIVES AT 1030. NEEDS HG AND EKG. WILL TAKE ZEGERID, ATIVAN, AND PRESTIQ AM OF SURG. W/ SIP OF WATER.

## 2012-01-18 ENCOUNTER — Encounter (HOSPITAL_BASED_OUTPATIENT_CLINIC_OR_DEPARTMENT_OTHER): Payer: Self-pay | Admitting: *Deleted

## 2012-01-18 ENCOUNTER — Encounter (HOSPITAL_BASED_OUTPATIENT_CLINIC_OR_DEPARTMENT_OTHER)
Admission: RE | Disposition: A | Discharge status: Home / Self Care | Payer: Self-pay | Source: Ambulatory Visit | Attending: Specialist

## 2012-01-18 ENCOUNTER — Encounter (HOSPITAL_BASED_OUTPATIENT_CLINIC_OR_DEPARTMENT_OTHER): Payer: Self-pay | Admitting: Anesthesiology

## 2012-01-18 ENCOUNTER — Ambulatory Visit (HOSPITAL_BASED_OUTPATIENT_CLINIC_OR_DEPARTMENT_OTHER)
Admission: RE | Admit: 2012-01-18 | Discharge: 2012-01-18 | Disposition: A | Discharge status: Home / Self Care | Payer: 59 | Source: Ambulatory Visit | Attending: Specialist | Admitting: Specialist

## 2012-01-18 ENCOUNTER — Ambulatory Visit (HOSPITAL_COMMUNITY): Payer: 59

## 2012-01-18 ENCOUNTER — Ambulatory Visit (HOSPITAL_BASED_OUTPATIENT_CLINIC_OR_DEPARTMENT_OTHER): Payer: 59 | Admitting: Anesthesiology

## 2012-01-18 DIAGNOSIS — K219 Gastro-esophageal reflux disease without esophagitis: Secondary | ICD-10-CM | POA: Insufficient documentation

## 2012-01-18 DIAGNOSIS — M24119 Other articular cartilage disorders, unspecified shoulder: Secondary | ICD-10-CM | POA: Insufficient documentation

## 2012-01-18 DIAGNOSIS — Z86718 Personal history of other venous thrombosis and embolism: Secondary | ICD-10-CM | POA: Insufficient documentation

## 2012-01-18 DIAGNOSIS — Z79899 Other long term (current) drug therapy: Secondary | ICD-10-CM | POA: Insufficient documentation

## 2012-01-18 DIAGNOSIS — M1990 Unspecified osteoarthritis, unspecified site: Secondary | ICD-10-CM

## 2012-01-18 DIAGNOSIS — M545 Low back pain: Secondary | ICD-10-CM

## 2012-01-18 DIAGNOSIS — M7580 Other shoulder lesions, unspecified shoulder: Secondary | ICD-10-CM

## 2012-01-18 DIAGNOSIS — X58XXXA Exposure to other specified factors, initial encounter: Secondary | ICD-10-CM | POA: Insufficient documentation

## 2012-01-18 DIAGNOSIS — G8929 Other chronic pain: Secondary | ICD-10-CM

## 2012-01-18 DIAGNOSIS — M25819 Other specified joint disorders, unspecified shoulder: Secondary | ICD-10-CM | POA: Insufficient documentation

## 2012-01-18 DIAGNOSIS — S43429A Sprain of unspecified rotator cuff capsule, initial encounter: Secondary | ICD-10-CM | POA: Insufficient documentation

## 2012-01-18 DIAGNOSIS — M7500 Adhesive capsulitis of unspecified shoulder: Secondary | ICD-10-CM | POA: Insufficient documentation

## 2012-01-18 HISTORY — DX: Bariatric surgery status: Z98.84

## 2012-01-18 HISTORY — PX: SHOULDER CLOSED REDUCTION: SHX1051

## 2012-01-18 LAB — POCT HEMOGLOBIN-HEMACUE: Hemoglobin: 13.5 g/dL (ref 12.0–15.0)

## 2012-01-18 SURGERY — MANIPULATION, JOINT, SHOULDER, WITH ANESTHESIA
Anesthesia: General | Site: Shoulder | Laterality: Left | Wound class: Clean

## 2012-01-18 MED ORDER — LACTATED RINGERS IV SOLN
INTRAVENOUS | Status: DC
  Administered 2012-01-18: 12:00:00 via INTRAVENOUS

## 2012-01-18 MED ORDER — ONDANSETRON HCL 4 MG/2ML IJ SOLN
INTRAMUSCULAR | Status: DC | PRN
  Administered 2012-01-18: 4 mg via INTRAVENOUS

## 2012-01-18 MED ORDER — CEFAZOLIN SODIUM-DEXTROSE 2-3 GM-% IV SOLR
2.0000 g | INTRAVENOUS | Status: AC
  Administered 2012-01-18: 2 g via INTRAVENOUS

## 2012-01-18 MED ORDER — MIDAZOLAM HCL 2 MG/2ML IJ SOLN
2.0000 mg | Freq: Once | INTRAMUSCULAR | Status: AC
  Administered 2012-01-18: 2 mg via INTRAVENOUS

## 2012-01-18 MED ORDER — ROCURONIUM BROMIDE 100 MG/10ML IV SOLN
INTRAVENOUS | Status: DC | PRN
  Administered 2012-01-18: 30 mg via INTRAVENOUS

## 2012-01-18 MED ORDER — SODIUM CHLORIDE 0.9 % IR SOLN
Status: DC | PRN
  Administered 2012-01-18: 13:00:00

## 2012-01-18 MED ORDER — ROPIVACAINE HCL 5 MG/ML IJ SOLN
INTRAMUSCULAR | Status: DC | PRN
  Administered 2012-01-18: 30 mL

## 2012-01-18 MED ORDER — NEOSTIGMINE METHYLSULFATE 1 MG/ML IJ SOLN
INTRAMUSCULAR | Status: DC | PRN
  Administered 2012-01-18: 1.5 mg via INTRAVENOUS

## 2012-01-18 MED ORDER — FENTANYL CITRATE 0.05 MG/ML IJ SOLN: 25.0000 ug | INTRAMUSCULAR | Status: DC | PRN

## 2012-01-18 MED ORDER — LIDOCAINE HCL (CARDIAC) 20 MG/ML IV SOLN
INTRAVENOUS | Status: DC | PRN
  Administered 2012-01-18: 45 mg via INTRAVENOUS

## 2012-01-18 MED ORDER — METOCLOPRAMIDE HCL 5 MG/ML IJ SOLN: 10.0000 mg | Freq: Once | INTRAMUSCULAR | Status: DC | PRN

## 2012-01-18 MED ORDER — PROPOFOL 10 MG/ML IV EMUL
INTRAVENOUS | Status: DC | PRN
  Administered 2012-01-18: 40 mg via INTRAVENOUS
  Administered 2012-01-18: 80 mg via INTRAVENOUS
  Administered 2012-01-18: 40 mg via INTRAVENOUS

## 2012-01-18 MED ORDER — CHLORHEXIDINE GLUCONATE 4 % EX LIQD: 60.0000 mL | Freq: Once | CUTANEOUS | Status: DC

## 2012-01-18 MED ORDER — CLINDAMYCIN PHOSPHATE 600 MG/50ML IV SOLN
INTRAVENOUS | Status: DC | PRN
  Administered 2012-01-18: 600 mg via INTRAVENOUS

## 2012-01-18 MED ORDER — GLYCOPYRROLATE 0.2 MG/ML IJ SOLN
INTRAMUSCULAR | Status: DC | PRN
Start: 2012-01-18 — End: 2012-01-18
  Administered 2012-01-18: 0.3 mg via INTRAVENOUS

## 2012-01-18 MED ORDER — DEXAMETHASONE SODIUM PHOSPHATE 4 MG/ML IJ SOLN
INTRAMUSCULAR | Status: DC | PRN
  Administered 2012-01-18: 10 mg via INTRAVENOUS

## 2012-01-18 MED ORDER — BUPIVACAINE-EPINEPHRINE 0.25% -1:200000 IJ SOLN
INTRAMUSCULAR | Status: DC | PRN
  Administered 2012-01-18: 13 mL

## 2012-01-18 MED ORDER — FENTANYL CITRATE 0.05 MG/ML IJ SOLN
100.0000 ug | Freq: Once | INTRAMUSCULAR | Status: AC
  Administered 2012-01-18: 100 ug via INTRAVENOUS

## 2012-01-18 MED ORDER — OXYCODONE-ACETAMINOPHEN 5-325 MG PO TABS: 2.0000 | ORAL_TABLET | ORAL | Status: DC | PRN

## 2012-01-18 MED ORDER — LACTATED RINGERS IV SOLN
INTRAVENOUS | Status: DC
  Administered 2012-01-18: 13:00:00 via INTRAVENOUS

## 2012-01-18 MED ORDER — MIDAZOLAM HCL 2 MG/2ML IJ SOLN: 2.0000 mg | Freq: Once | INTRAMUSCULAR | Status: DC

## 2012-01-18 SURGICAL SUPPLY — 73 items
BENZOIN TINCTURE PRP APPL 2/3 (GAUZE/BANDAGES/DRESSINGS) IMPLANT
BLADE 4.2CUDA (BLADE) ×3 IMPLANT
BLADE CUDA 4.2 (BLADE) ×3 IMPLANT
BLADE CUDA SHAVER 3.5 (BLADE) ×3 IMPLANT
BLADE CUTTER GATOR 3.5 (BLADE) IMPLANT
BLADE FLAT COURSE (BLADE) IMPLANT
BLADE GREAT WHITE 4.2 (BLADE) IMPLANT
BLADE SURG 11 STRL SS (BLADE) ×3 IMPLANT
BNDG COHESIVE 4X5 TAN NS LF (GAUZE/BANDAGES/DRESSINGS) ×3 IMPLANT
BUR OVAL 4.0 (BURR) IMPLANT
BUR VERTEX HOODED 4.5 (BURR) IMPLANT
CANISTER SUCT LVC 12 LTR MEDI- (MISCELLANEOUS) ×3 IMPLANT
CANISTER SUCTION 2500CC (MISCELLANEOUS) ×3 IMPLANT
CANNULA ACUFLEX KIT 5X76 (CANNULA) ×3 IMPLANT
CANNULA SHOULDER 7CM (CANNULA) ×3 IMPLANT
CANNULA TWIST IN 8.25X7CM (CANNULA) IMPLANT
CLEANER CAUTERY TIP 5X5 PAD (MISCELLANEOUS) IMPLANT
CLOTH BEACON ORANGE TIMEOUT ST (SAFETY) ×3 IMPLANT
DRAPE LG THREE QUARTER DISP (DRAPES) IMPLANT
DRAPE STERI 35X30 U-POUCH (DRAPES) ×3 IMPLANT
DRAPE U 20/CS (DRAPES) ×6 IMPLANT
DRSG EMULSION OIL 3X3 NADH (GAUZE/BANDAGES/DRESSINGS) ×3 IMPLANT
DRSG PAD ABDOMINAL 8X10 ST (GAUZE/BANDAGES/DRESSINGS) ×3 IMPLANT
DURAPREP 26ML APPLICATOR (WOUND CARE) ×3 IMPLANT
ELECT MENISCUS 165MM 90D (ELECTRODE) IMPLANT
ELECT NEEDLE TIP 2.8 STRL (NEEDLE) ×3 IMPLANT
ELECT REM PT RETURN 9FT ADLT (ELECTROSURGICAL) ×3
ELECTRODE REM PT RTRN 9FT ADLT (ELECTROSURGICAL) ×2 IMPLANT
GLOVE BIO SURGEON STRL SZ 6.5 (GLOVE) ×9 IMPLANT
GLOVE ECLIPSE 6.0 STRL STRAW (GLOVE) ×3 IMPLANT
GLOVE INDICATOR 7.0 STRL GRN (GLOVE) ×3 IMPLANT
GLOVE SURG SS PI 8.0 STRL IVOR (GLOVE) ×3 IMPLANT
GOWN STRL NON-REIN LRG LVL3 (GOWN DISPOSABLE) ×6 IMPLANT
GOWN SURGICAL XLG (GOWNS) ×3 IMPLANT
NDL SAFETY ECLIPSE 18X1.5 (NEEDLE) ×2 IMPLANT
NDL SUT 6 .5 CRC .975X.05 MAYO (NEEDLE) IMPLANT
NEEDLE 1/2 CIR CATGUT .05X1.09 (NEEDLE) IMPLANT
NEEDLE FILTER BLUNT 18X 1/2SAF (NEEDLE) ×1
NEEDLE FILTER BLUNT 18X1 1/2 (NEEDLE) ×2 IMPLANT
NEEDLE HYPO 18GX1.5 SHARP (NEEDLE) ×1
NEEDLE MAYO TAPER (NEEDLE)
NEEDLE SPNL 18GX3.5 QUINCKE PK (NEEDLE) ×3 IMPLANT
NS IRRIG 500ML POUR BTL (IV SOLUTION) IMPLANT
PACK BASIN DAY SURGERY FS (CUSTOM PROCEDURE TRAY) ×3 IMPLANT
PACK SHOULDER CUSTOM OPM052 (CUSTOM PROCEDURE TRAY) ×3 IMPLANT
PAD CLEANER CAUTERY TIP 5X5 (MISCELLANEOUS)
SET ARTHROSCOPY TUBING (MISCELLANEOUS) ×1
SET ARTHROSCOPY TUBING LN (MISCELLANEOUS) ×2 IMPLANT
SLING ARM FOAM STRAP LRG (SOFTGOODS) IMPLANT
SLING ARM IMMOBILIZER MED (SOFTGOODS) ×6 IMPLANT
SLING ULTRA II LARGE (SOFTGOODS) IMPLANT
SPONGE GAUZE 4X4 12PLY (GAUZE/BANDAGES/DRESSINGS) ×3 IMPLANT
STAPLER VISISTAT 35W (STAPLE) IMPLANT
STRIP CLOSURE SKIN 1/2X4 (GAUZE/BANDAGES/DRESSINGS) IMPLANT
SUT BONE WAX W31G (SUTURE) IMPLANT
SUT ETHIBOND 1 OS 2 18 CR3 (SUTURE) IMPLANT
SUT ETHIBOND 2 OS 4 DA (SUTURE) IMPLANT
SUT ETHILON 4 0 PS 2 18 (SUTURE) ×3 IMPLANT
SUT FIBERWIRE #2 38 REV NDL BL (SUTURE)
SUT MON AB 4-0 PC3 18 (SUTURE) IMPLANT
SUT PDS AB 0 CT 36 (SUTURE) IMPLANT
SUT PROLENE 3 0 PS 2 (SUTURE) IMPLANT
SUT VIC AB 1 CT1 36 (SUTURE) IMPLANT
SUT VIC AB 1-0 CT2 27 (SUTURE) IMPLANT
SUT VIC AB 2-0 CT2 27 (SUTURE) ×3 IMPLANT
SUT VICRYL 0 UR6 27IN ABS (SUTURE) ×6 IMPLANT
SUT VICRYL 0-0 OS 2 NEEDLE (SUTURE) IMPLANT
SUTURE FIBERWR#2 38 REV NDL BL (SUTURE) IMPLANT
SYRINGE 10CC LL (SYRINGE) ×3 IMPLANT
TAPE HYPAFIX 6X30 (GAUZE/BANDAGES/DRESSINGS) ×3 IMPLANT
TOWEL OR 17X24 6PK STRL BLUE (TOWEL DISPOSABLE) ×6 IMPLANT
WAND 90 DEG TURBOVAC W/CORD (SURGICAL WAND) IMPLANT
WATER STERILE IRR 500ML POUR (IV SOLUTION) ×3 IMPLANT

## 2012-01-18 NOTE — Brief Op Note (Signed)
01/18/2012  1:46 PM  PATIENT:  Reymundo Poll  51 y.o. female  PRE-OPERATIVE DIAGNOSIS:  left shoulder impingement   POST-OPERATIVE DIAGNOSIS:  left shoulder impingement   PROCEDURE:  Procedure(s) (LRB): CLOSED MANIPULATION SHOULDER (Left) SHOULDER ARTHROSCOPY WITH SUBACROMIAL DECOMPRESSION (Left)  SURGEON:  Surgeon(s) and Role:    * Javier Docker, MD - Primary  PHYSICIAN ASSISTANT:   ASSISTANTS: strader   ANESTHESIA:   general  EBL:     BLOOD ADMINISTERED:none  DRAINS: none   LOCAL MEDICATIONS USED:  MARCAINE     SPECIMEN:  No Specimen  DISPOSITION OF SPECIMEN:  N/A  COUNTS:  YES  TOURNIQUET:  * No tourniquets in log *  DICTATION: .Other Dictation: Dictation Number [id number]  PLAN OF CARE: Discharge to home after PACU  PATIENT DISPOSITION:  PACU - hemodynamically stable.   Delay start of Pharmacological VTE agent (>24hrs) due to surgical blood loss or risk of bleeding: no

## 2012-01-18 NOTE — Anesthesia Procedure Notes (Addendum)
Anesthesia Regional Block:  Interscalene brachial plexus block  Pre-Anesthetic Checklist: ,, timeout performed, Correct Patient, Correct Site, Correct Laterality, Correct Procedure, Correct Position, site marked, Risks and benefits discussed,  Surgical consent,  Pre-op evaluation,  At surgeon's request and post-op pain management  Laterality: Left and Upper  Prep: chloraprep       Needles:  Injection technique: Single-shot  Needle Type: Stimiplex          Additional Needles:  Procedures: ultrasound guided and nerve stimulator Interscalene brachial plexus block  Nerve Stimulator or Paresthesia:  Response: 0.6 mA,   Additional Responses:   Narrative:   Performed by: Personally  Anesthesiologist: Raye Slyter  Additional Notes:  No pain on injection. No increased resistance to injection.  Motor intact immediately after block. Loss of deltoid function at 20 minutes.     Interscalene brachial plexus block Procedure Name: Intubation Date/Time: 01/18/2012 12:47 PM Performed by: Maris Berger T Pre-anesthesia Checklist: Patient identified, Emergency Drugs available, Suction available and Patient being monitored Patient Re-evaluated:Patient Re-evaluated prior to inductionOxygen Delivery Method: Circle System Utilized Preoxygenation: Pre-oxygenation with 100% oxygen Intubation Type: IV induction Ventilation: Mask ventilation without difficulty Laryngoscope Size: Mac and 3 Grade View: Grade II Tube type: Oral Tube size: 7.0 mm Number of attempts: 1 Airway Equipment and Method: stylet and oral airway Placement Confirmation: ETT inserted through vocal cords under direct vision,  positive ETCO2 and breath sounds checked- equal and bilateral Secured at: 22 cm Tube secured with: Tape Dental Injury: Teeth and Oropharynx as per pre-operative assessment

## 2012-01-18 NOTE — Discharge Instructions (Addendum)
Begin aspirin 325mg  daily starting tomorrow    General Anesthetic, Adult A doctor specialized in giving anesthesia (anesthesiologist) or a nurse specialized in giving anesthesia (nurse anesthetist) gives medicine that makes you sleep while a procedure is performed (general anesthetic). Once the general anesthetic has been administered, you will be in a sleeplike state in which you feel no pain. After having a general anestheticyou may feel:   Dizzy.   Weak.   Drowsy.   Confused.  These feelings are normal and can be expected to last for up to 24 hours after the procedure is completed.  LET YOUR CAREGIVER KNOW ABOUT:  Allergies you have.   Medications you are taking, including herbs, eye drops, over the counter medications, dietary supplements, and creams.   Previous problems you have had with anesthetics or numbing medicines.   Use of cigarettes, alcohol, or illicit drugs.   Possibility of pregnancy, if this applies.   History of bleeding or blood disorders, including blood clots and clotting disorders.   Previous surgeries you have had and types of anesthetics you have received.   Family medical history, especially anesthetic problems.   Other health problems.  BEFORE THE PROCEDURE  You may brush your teeth on the morning of surgery but you should have no solid food or non-clear liquids for a minimum of 8 hours prior to your procedure. Clear liquids (water, black coffee, and tea) are acceptable in small amounts until 2 hours prior to your procedure.   You may take your regular medications the morning of your procedure unless your caregiver indicates otherwise.  AFTER THE PROCEDURE  After surgery, you will be taken to the recovery area where a nurse will monitor your progress. You will be allowed to go home when you are awake, stable, taking fluids well, and without serious pain or complications.   For the first 24 hours following an anesthetic:   Have a responsible  person with you.   Do not drive a car. If you are alone, do not take public transportation.   Do not engage in strenuous activity. You may usually resume normal activities the next day, or as advised by your caregiver.   Do not drink alcohol.   Do not take medicine that has not been prescribed by your caregiver.   Do not sign important papers or make important decisions as your judgement may be impaired.   You may resume a normal diet as directed.   Change bandages (dressings) as directed.   Only take over-the-counter or prescription medicines for pain, discomfort, or fever as directed by your caregiver.  If you have questions or problems that seem related to the anesthetic, call the hospital and ask for the anesthetist, anesthesiologist, or anesthesia department. SEEK IMMEDIATE MEDICAL CARE IF:   You develop a rash.   You have difficulty breathing.   You have chest pain.   You have allergic problems.   You have uncontrolled nausea.   You have uncontrolled vomiting.   You develop any serious bleeding, especially from the incision site.  Document Released: 01/05/2008 Document Revised: 09/17/2011 Document Reviewed: 01/29/2011 [facility] Patient Information 2012 [place], [place].  Peripheral Nerve Blocks Your caregiver has placed a nerve block in one of your arms or legs to reduce pain and discomfort. The block lessens the amount of pain medicine you will need. Your caregiver will inject you in the arm or leg that was operated on. The injection is usually given away from the surgical site. The injection is a  local anesthetic or a combination of local anesthetics. This injection provides numbing pain relief for up to 18 to 24 hours. There are few possible complications from this procedure. However, you should notify your caregiver if you have any problems. Be aware that you may lose feeling at and around the surgical area. If numbness happens, take proper measures to avoid injury. Do  not stand up unassisted if you have a nerve block in your leg. Do not try to lift items if you have had a nerve block in your arm. Be careful when placing hot or cold items on a numb extremity. SEEK IMMEDIATE MEDICAL CARE IF:  You have redness, swelling, pain, or discharge at the injection site.   You develop dizziness or lightheadedness.   You have blurred vision.   There is a ringing or buzzing in your ears.   You have a metal taste in your mouth.   You develop numbness or tingling around your mouth.   You develop drowsiness.   You develop confusion.  Document Released: 01/05/2008 Document Revised: 09/17/2011 Document Reviewed: 11/17/2010 [facility] Patient Information 2012 [place], [place].

## 2012-01-18 NOTE — H&P (Signed)
Joyce Payne is an 51 y.o. female.   Chief Complaint: left shoulder pain HPI: Pain refractory. Adhesive capsulitis  Past Medical History  Diagnosis Date  . Anxiety   . Depression   . GERD (gastroesophageal reflux disease)   . History of DVT (deep vein thrombosis) NOV 2011- RIGHT LOWER EXTREMITY  . History of pulmonary embolism NOV 2011- BILATERAL PE'S    COUMDADIN THERAPY ENDED JUNE 2012  . 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 Impingement syndrome of left shoulder   . Arthritis of knee   . Arthritis of lumbar spine   . S/P gastric bypass   . Sinus infection     Past Surgical History  Procedure Date  . Gastric bypass 1999  . Patella fracture surgery   . Tonsilectomy, adenoidectomy, bilateral myringotomy and tubes 1985  . Lumbar disc surgery 2009  . Port-a-cath insertion 2011    Family History  Problem Relation Age of Onset  . Hypertension Other   . Cancer Other     colon, 1st degree relative<60  . Hypertension Mother   . Hyperlipidemia Mother   . Cancer Father    Social History:  reports that she quit smoking about 5 years ago. Her smoking use included Cigarettes. She quit after 10 years of use. She has never used smokeless tobacco. She reports that she does not drink alcohol or use illicit drugs.  Allergies:  Allergies  Allergen Reactions  . Povidone-Iodine Rash    Medications Prior to Admission  Medication Dose Route Frequency Provider Last Rate Last Dose  . 1 mL EPINEPHrine 1 mg/mL (1:1000) in 0.9% Normal Saline 3000 mL irrigation    PRN Javier Docker, MD      . 1 mL EPINEPHrine 1 mg/mL (1:1000) in 0.9% Normal Saline 3000 mL irrigation    PRN Javier Docker, MD      . ceFAZolin (ANCEF) IVPB 2 g/50 mL premix  2 g Intravenous 60 min Pre-Op Liam Graham, PA      . chlorhexidine (HIBICLENS) 4 % liquid 4 application  60 mL Topical Once Liam Graham, PA      .  fentaNYL (SUBLIMAZE) injection 100 mcg  100 mcg Intravenous Once Azell Der, MD   100 mcg at 01/18/12 1200  . lactated ringers infusion   Intravenous Continuous Eilene Ghazi, MD      . midazolam (VERSED) injection 2 mg  2 mg Intravenous Once Azell Der, MD      . midazolam (VERSED) injection 2 mg  2 mg Intravenous Once Azell Der, MD   2 mg at 01/18/12 1220  . DISCONTD: lactated ringers infusion   Intravenous Continuous Liam Graham, PA 50 mL/hr at 01/18/12 1130     Medications Prior to Admission  Medication Sig Dispense Refill  . BusPIRone HCl (BUSPAR PO) Take 25 mg by mouth 2 (two) times daily.       . cholecalciferol (VITAMIN D) 1000 UNITS tablet Take 1,000 Units by mouth daily.       Marland Kitchen desvenlafaxine (PRISTIQ) 100 MG 24 hr tablet Take 100 mg by mouth daily.       . Multiple Vitamin (MULTIVITAMIN) tablet Take 1 tablet by mouth daily.       Maxwell Caul Bicarbonate (ZEGERID OTC PO) Take by mouth every morning.       Marland Kitchen oxyCODONE-acetaminophen (ROXICET) 5-325 MG per  tablet Take 1 tablet by mouth every 6 (six) hours as needed for pain. Fill on or after 04/052013  100 tablet  0  . topiramate (TOPAMAX) 25 MG tablet Take 25 mg by mouth every evening. Take 75 mg daily      . traZODone (DESYREL) 100 MG tablet Take 100 mg by mouth at bedtime. Take 200 mg qhs      . vitamin C (ASCORBIC ACID) 500 MG tablet Take 500 mg by mouth daily.         Results for orders placed during the hospital encounter of 01/18/12 (from the past 48 hour(s))  POCT HEMOGLOBIN-HEMACUE     Status: Normal   Collection Time   01/18/12 11:54 AM      Component Value Range Comment   Hemoglobin 13.5  12.0 - 15.0 (g/dL)    No results found.  Review of Systems  Musculoskeletal: Positive for joint pain.  All other systems reviewed and are negative.    Blood pressure 95/51, pulse 82, temperature 97.1 F (36.2 C), temperature source Oral, resp. rate 13, height 5' 7.5" (1.715 m), weight 97.523 kg (215  lb), SpO2 100.00%. Physical Exam  Vitals reviewed. Constitutional: She appears well-developed.  HENT:  Head: Normocephalic.  Eyes: Pupils are equal, round, and reactive to light.  Neck: Normal range of motion.  Cardiovascular: Normal rate.   Respiratory: Effort normal.  GI: Soft.  Musculoskeletal: She exhibits tenderness.       Decreased ROM left shoulder. Positive impingement left.   Neurological: She is alert.  Skin: Skin is warm.  Psychiatric: She has a normal mood and affect.     Assessment/Plan Imp Left shoulder, adhesive capsulitis. Plan MUA EUA LSA SAD [id number]  Ioma Chismar C 01/18/2012, 12:38 PM

## 2012-01-18 NOTE — Transfer of Care (Signed)
Immediate Anesthesia Transfer of Care Note  Patient: Joyce Payne  Procedure(s) Performed: Procedure(s) (LRB): CLOSED MANIPULATION SHOULDER (Left) SHOULDER ARTHROSCOPY WITH SUBACROMIAL DECOMPRESSION (Left)  Patient Location: PACU  Anesthesia Type: General  Level of Consciousness: awake and oriented  Airway & Oxygen Therapy: Patient Spontanous Breathing and Patient connected to face mask oxygen  Post-op Assessment: Report given to PACU RN  Post vital signs: Reviewed and stable  Complications: No apparent anesthesia complications 

## 2012-01-18 NOTE — Anesthesia Preprocedure Evaluation (Addendum)
Anesthesia Evaluation  Patient identified by MRN, date of birth, ID band Patient awake  General Assessment Comment:H/o Hodgkin's lymphoma. H/o DVT RLE, PE. S/p gastric bypass  Reviewed: Allergy & Precautions, H&P , NPO status , Patient's Chart, lab work & pertinent test results  History of Anesthesia Complications (+) AWARENESS UNDER ANESTHESIA  Airway Mallampati: II TM Distance: >3 FB Neck ROM: Full    Dental No notable dental hx.    Pulmonary neg pulmonary ROS,  breath sounds clear to auscultation  Pulmonary exam normal       Cardiovascular negative cardio ROS  Rhythm:Regular Rate:Normal     Neuro/Psych PSYCHIATRIC DISORDERS Anxiety Depression negative neurological ROS     GI/Hepatic Neg liver ROS, GERD-  Medicated,  Endo/Other  negative endocrine ROS  Renal/GU negative Renal ROS  negative genitourinary   Musculoskeletal negative musculoskeletal ROS (+)   Abdominal (+) + obese,   Peds negative pediatric ROS (+)  Hematology negative hematology ROS (+)   Anesthesia Other Findings   Reproductive/Obstetrics negative OB ROS                          Anesthesia Physical Anesthesia Plan  ASA: II  Anesthesia Plan: General   Post-op Pain Management:    Induction: Intravenous  Airway Management Planned: Oral ETT  Additional Equipment:   Intra-op Plan:   Post-operative Plan: Extubation in OR  Informed Consent: I have reviewed the patients History and Physical, chart, labs and discussed the procedure including the risks, benefits and alternatives for the proposed anesthesia with the patient or authorized representative who has indicated his/her understanding and acceptance.   Dental advisory given  Plan Discussed with: CRNA  Anesthesia Plan Comments: (Discussed risks and benefits of interscalene block including failure, bleeding, infection, nerve damage, weakness. Questions answered.  Patient consents to block. )        Anesthesia Quick Evaluation

## 2012-01-18 NOTE — Anesthesia Postprocedure Evaluation (Signed)
Anesthesia Post Note  Patient: Joyce Payne  Procedure(s) Performed: Procedure(s) (LRB): CLOSED MANIPULATION SHOULDER (Left) SHOULDER ARTHROSCOPY WITH SUBACROMIAL DECOMPRESSION (Left)  Anesthesia type: General  Patient location: PACU  Post pain: Pain level controlled  Post assessment: Post-op Vital signs reviewed  Last Vitals:  Filed Vitals:   01/18/12 1405  BP:   Pulse: 89  Temp:   Resp: 17    Post vital signs: Reviewed  Level of consciousness: sedated  Complications: No apparent anesthesia complications

## 2012-01-18 NOTE — Transfer of Care (Signed)
Immediate Anesthesia Transfer of Care Note  Patient: Joyce Payne  Procedure(s) Performed: Procedure(s) (LRB): CLOSED MANIPULATION SHOULDER (Left) SHOULDER ARTHROSCOPY WITH SUBACROMIAL DECOMPRESSION (Left)  Patient Location: PACU  Anesthesia Type: General  Level of Consciousness: awake and oriented  Airway & Oxygen Therapy: Patient Spontanous Breathing and Patient connected to face mask oxygen  Post-op Assessment: Report given to PACU RN  Post vital signs: Reviewed and stable  Complications: No apparent anesthesia complications

## 2012-01-19 ENCOUNTER — Encounter (HOSPITAL_BASED_OUTPATIENT_CLINIC_OR_DEPARTMENT_OTHER): Payer: Self-pay | Admitting: Specialist

## 2012-01-19 NOTE — Op Note (Signed)
NAMECHAIRTY, TOMAN                ACCOUNT NO.:  [id number]  MEDICAL RECORD NO.:  [id number]  LOCATION:                               FACILITY:  [facility]  PHYSICIAN:  Jene Every, M.D.    DATE OF BIRTH:  10-15-1960  DATE OF PROCEDURE:  01/18/2012 DATE OF DISCHARGE:                              OPERATIVE REPORT   PREOPERATIVE DIAGNOSES:  Capsulitis, impingement syndrome, rotator cuff tear, left shoulder labral tear.  POSTOPERATIVE DIAGNOSES:  Capsulitis, impingement syndrome, rotator cuff tear, left shoulder labral tear.  PROCEDURE PERFORMED: 1. Exam under anesthesia followed by manipulation under anesthesia. 2. Left shoulder arthroscopy with debridement of anterior labrum and     glenohumeral joint. 3. Subacromial decompression with bursectomy, evaluation of the     rotator cuff.  ANESTHESIA:  General.  ASSISTANT:  Roma Schanz, P.A.  HISTORY:  This is a pleasant 51 year old female who has had refractory shoulder pain, adhesive capsulitis, history of cancer and chemotherapy. She had restrictive range of motion, impingement pain, started with impingement pain and progressed to adhesive capsulitis.  Despite conservative treatment she had persistent symptoms.  She was indicated for arthroscopic debridement, manipulation under anesthesia, possible rotator cuff repair.  She had a partial rotator cuff, less than 50%.  I discussed the possibility of open repair.  Risks and benefits were discussed including bleeding, infection, damage to vascular structures, no change in symptoms, worsening symptoms, need for repeat debridement, DVT, PE, anesthetic complications etc.  TECHNIQUE:  With the patient supine in beach-chair position, after induction of adequate general anesthesia, 2 g Kefzol, and clindamycin 600 mg, the shoulder was examined under anesthesia.  Her abduction was limited at 6 degrees, forward flexion to 110.  External rotation was slightly limited in the abducted  position.  We proceeded with manipulation.  Gentle abduction was performed with the scapulothoracic region stabilized by an assistant.  We achieved appreciation of lysis of adhesions.  With gentle manipulation, was able to improve it to 110, 120 degrees.  In a similar fashion we were able to achieve 160 degrees of forward flexion, stabilizing the scapulothoracic region.  Lysis of adhesions was appreciated.  Internal rotation was essentially unremarkable.  External rotation with the arm in the adducted position was essentially normal, in the abducted position we improved it slightly with external rotation, stabilizing the scapulothoracic region.  We achieved full essentially full range of motion.  Therefore proceeded with a shoulder arthroscopy.  We prepped and draped the upper extremity in the usual sterile fashion.  A surgical marker was utilized to delineate the acromion, AC joint, and the coracoid.  Standard posterior lateral portal was utilized with incision by a #11 blade through the skin only.  With the arm in 70/30 position we advanced the arthroscopic camera into the glenohumeral joint penetrating the capsule atraumatically.  Hemorrhagic tissue was noted and tearing of the anterior labrum.  The undersurface of the rotator cuff was unremarkable with some suboptimal visualized due to hemarthrosis.  We introduced an #18-gauge needle to fashion the anterior portal.  This was midway between the coracoid and the anterolateral aspect of the acromion with a #18-gauge needle, penetrating the capsule just beneath the biceps  tendon.  I then in a curvilinear fashion advanced a cannula into the glenohumeral joint penetrating atraumatically.  I then introduced a shaver and performed a gentle lavage of the glenohumeral joint, then further inspected with better visualization of the rotator cuff.  It was not torn with external and internal rotation.  The glenohumeral joint was unremarkable as  well.  There was tearing of the anterior labrum and this was debrided with a 3-5 Cuda shaver.  The biceps tendon was unremarkable, stable in its groove.  After copious lavage, I redirected the camera in the subacromial space, fashioned an anterolateral portal with a #11 blade, triangulating into the subacromial space. Hypertrophic bursa was noted and I performed a full bursectomy anteriorly, posteriorly and laterally.  There was extension of the lesion over the anterolateral aspect of the acromion, and this was released with an ArthroWand, as was the CA ligament, this was morselized.  This improved the space in the subacromial joint.  We performed a small shave on the anterolateral aspect of the acromion. The rotator cuff was frayed, but without evidence of a full-thickness tear.  Therefore, after full inspection and probing, I felt there was ample room, no further arthroscopic intervention required, copiously lavaged the joint.  I then removed all instrumentation.  Portals were closed with 4-0 nylon simple sutures, copious Marcaine with epinephrine was infiltrated in the joint.  The wound was dressed sterilely, placed in a sling, extubated without difficulty, and transported to the recovery in satisfactory condition.  The patient tolerated the procedure well.  No complications.  Assistant was [employer].  The patient had a preoperative block, upper extremity brachial plexus block.     Jene Every, M.D.     Cordelia Pen  D:  01/18/2012  T:  01/19/2012  Job:  [id number]

## 2012-01-20 ENCOUNTER — Telehealth: Payer: Self-pay

## 2012-01-20 NOTE — Telephone Encounter (Signed)
Patient notified per MD and will call back if it helps to get a new script

## 2012-01-20 NOTE — Telephone Encounter (Signed)
Yes, she can take additional oxy each day

## 2012-01-20 NOTE — Telephone Encounter (Signed)
Patient called stating that she had surgery on Monday and c/o " extreme pain" that causes her not to sleep. Current dosage of oxy is not helping and she would like to know if MD will increase . Thanks

## 2012-01-21 ENCOUNTER — Telehealth: Payer: Self-pay

## 2012-01-21 DIAGNOSIS — M1990 Unspecified osteoarthritis, unspecified site: Secondary | ICD-10-CM

## 2012-01-21 DIAGNOSIS — G8929 Other chronic pain: Secondary | ICD-10-CM

## 2012-01-21 DIAGNOSIS — M7580 Other shoulder lesions, unspecified shoulder: Secondary | ICD-10-CM

## 2012-01-21 DIAGNOSIS — M545 Low back pain: Secondary | ICD-10-CM

## 2012-01-21 MED ORDER — OXYCODONE-ACETAMINOPHEN 5-325 MG PO TABS: 2.0000 | ORAL_TABLET | ORAL | Status: DC | PRN

## 2012-01-21 NOTE — Telephone Encounter (Signed)
Patient called to inform MD that the oxy 5/325 TID is helping with her pain (see previous phone note). She is now in need of a written script for the TID. Thanks

## 2012-01-21 NOTE — Telephone Encounter (Signed)
Patient notified ok to pick 01/22/12

## 2012-01-21 NOTE — Telephone Encounter (Signed)
done

## 2012-01-22 ENCOUNTER — Other Ambulatory Visit: Payer: Self-pay

## 2012-01-22 DIAGNOSIS — M7580 Other shoulder lesions, unspecified shoulder: Secondary | ICD-10-CM

## 2012-01-22 DIAGNOSIS — M25512 Pain in left shoulder: Secondary | ICD-10-CM

## 2012-01-22 DIAGNOSIS — M1990 Unspecified osteoarthritis, unspecified site: Secondary | ICD-10-CM

## 2012-01-22 DIAGNOSIS — G8929 Other chronic pain: Secondary | ICD-10-CM

## 2012-01-22 MED ORDER — OXYCODONE HCL 40 MG PO TB12: 40.0000 mg | ORAL_TABLET | Freq: Three times a day (TID) | ORAL | Status: DC | PRN

## 2012-02-04 ENCOUNTER — Ambulatory Visit (HOSPITAL_COMMUNITY)
Admission: RE | Admit: 2012-02-04 | Discharge: 2012-02-04 | Disposition: A | Discharge status: Home / Self Care | Payer: 59 | Source: Ambulatory Visit | Attending: Oncology | Admitting: Oncology

## 2012-02-04 ENCOUNTER — Other Ambulatory Visit (HOSPITAL_BASED_OUTPATIENT_CLINIC_OR_DEPARTMENT_OTHER): Payer: 59 | Admitting: Lab

## 2012-02-04 DIAGNOSIS — C8589 Other specified types of non-Hodgkin lymphoma, extranodal and solid organ sites: Secondary | ICD-10-CM | POA: Insufficient documentation

## 2012-02-04 DIAGNOSIS — Z452 Encounter for adjustment and management of vascular access device: Secondary | ICD-10-CM

## 2012-02-04 DIAGNOSIS — Z9221 Personal history of antineoplastic chemotherapy: Secondary | ICD-10-CM | POA: Insufficient documentation

## 2012-02-04 LAB — SEDIMENTATION RATE: Sed Rate: 1 mm/hr (ref 0–22)

## 2012-02-04 LAB — CMP (CANCER CENTER ONLY)
Albumin: 3.5 g/dL (ref 3.3–5.5)
Alkaline Phosphatase: 78 U/L (ref 26–84)
BUN, Bld: 24 mg/dL — ABNORMAL HIGH (ref 7–22)
Glucose, Bld: 105 mg/dL (ref 73–118)
Potassium: 4.3 mEq/L (ref 3.3–4.7)
Total Bilirubin: 0.7 mg/dl (ref 0.20–1.60)

## 2012-02-04 LAB — CBC WITH DIFFERENTIAL/PLATELET
Basophils Absolute: 0 10*3/uL (ref 0.0–0.1)
EOS%: 0.8 % (ref 0.0–7.0)
HCT: 38.7 % (ref 34.8–46.6)
HGB: 12.9 g/dL (ref 11.6–15.9)
MCH: 30.8 pg (ref 25.1–34.0)
MCV: 92.8 fL (ref 79.5–101.0)
MONO%: 5.8 % (ref 0.0–14.0)
NEUT%: 74 % (ref 38.4–76.8)

## 2012-02-04 LAB — LACTATE DEHYDROGENASE: LDH: 142 U/L (ref 94–250)

## 2012-02-04 MED ORDER — IOHEXOL 300 MG/ML  SOLN
100.0000 mL | Freq: Once | INTRAMUSCULAR | Status: AC | PRN
  Administered 2012-02-04: 100 mL via INTRAVENOUS

## 2012-02-08 ENCOUNTER — Telehealth: Payer: Self-pay

## 2012-02-08 ENCOUNTER — Encounter: Payer: Self-pay | Admitting: Internal Medicine

## 2012-02-08 ENCOUNTER — Ambulatory Visit (INDEPENDENT_AMBULATORY_CARE_PROVIDER_SITE_OTHER): Payer: 59 | Admitting: Internal Medicine

## 2012-02-08 VITALS — BP 112/70 | HR 81 | Temp 98.2°F | Resp 16 | Wt 223.0 lb

## 2012-02-08 DIAGNOSIS — M25519 Pain in unspecified shoulder: Secondary | ICD-10-CM

## 2012-02-08 DIAGNOSIS — M1990 Unspecified osteoarthritis, unspecified site: Secondary | ICD-10-CM

## 2012-02-08 DIAGNOSIS — M25512 Pain in left shoulder: Secondary | ICD-10-CM

## 2012-02-08 DIAGNOSIS — J309 Allergic rhinitis, unspecified: Secondary | ICD-10-CM

## 2012-02-08 DIAGNOSIS — M67919 Unspecified disorder of synovium and tendon, unspecified shoulder: Secondary | ICD-10-CM

## 2012-02-08 DIAGNOSIS — M7580 Other shoulder lesions, unspecified shoulder: Secondary | ICD-10-CM

## 2012-02-08 DIAGNOSIS — G8929 Other chronic pain: Secondary | ICD-10-CM

## 2012-02-08 MED ORDER — OXYCODONE HCL 40 MG PO TB12: 40.0000 mg | ORAL_TABLET | Freq: Two times a day (BID) | ORAL | Status: DC

## 2012-02-08 NOTE — Telephone Encounter (Signed)
Ct results given by phone per Dr Patsy Lager note. dph

## 2012-02-08 NOTE — Telephone Encounter (Signed)
Message copied by Albertha Ghee on Mon Feb 08, 2012 10:51 AM ------      Message from: Levert Feinstein      Created: Sun Feb 07, 2012  1:42 PM       Call pt CT negative for recurrent Hodgkins

## 2012-02-09 ENCOUNTER — Encounter: Payer: Self-pay | Admitting: Internal Medicine

## 2012-02-09 NOTE — Progress Notes (Signed)
Subjective:    Patient ID: Joyce Payne, female    DOB: 1961-01-24, 51 y.o.   MRN: [id number]  HPI She returns for f/up and tells me that her recovery from surgery is going well. She has been taking oxycontin TID but she is now ready to take it BID. Her ortho has also been prescribing percocet as needed.   Review of Systems  Constitutional: Negative.   HENT: Positive for sneezing and postnasal drip. Negative for hearing loss, ear pain, nosebleeds, congestion, sore throat, facial swelling, rhinorrhea, drooling, mouth sores, trouble swallowing, neck pain, neck stiffness, dental problem, voice change, sinus pressure, tinnitus and ear discharge.   Eyes: Negative.   Respiratory: Negative for cough, chest tightness, shortness of breath, wheezing and stridor.   Cardiovascular: Negative for chest pain, palpitations and leg swelling.  Gastrointestinal: Negative for nausea, vomiting, abdominal pain, diarrhea, constipation and blood in stool.  Genitourinary: Negative for dysuria, urgency, frequency, hematuria, flank pain, decreased urine volume, enuresis, difficulty urinating and dyspareunia.  Musculoskeletal: Positive for arthralgias. Negative for myalgias, back pain, joint swelling and gait problem.  Skin: Negative for color change, pallor, rash and wound.  Neurological: Negative for dizziness, tremors, seizures, syncope, facial asymmetry, speech difficulty, weakness, light-headedness, numbness and headaches.  Hematological: Negative for adenopathy. Does not bruise/bleed easily.  Psychiatric/Behavioral: Negative.        Objective:   Physical Exam  Vitals reviewed. Constitutional: She is oriented to person, place, and time. She appears well-developed and well-nourished. No distress.  HENT:  Head: Normocephalic and atraumatic. No trismus in the jaw.  Right Ear: Hearing, tympanic membrane, external ear and ear canal normal.  Left Ear: Hearing, external ear and ear canal normal.  Nose: Nose  normal. No mucosal edema, rhinorrhea, nose lacerations, sinus tenderness, nasal deformity, septal deviation or nasal septal hematoma. No epistaxis.  No foreign bodies. Right sinus exhibits no maxillary sinus tenderness and no frontal sinus tenderness. Left sinus exhibits no maxillary sinus tenderness and no frontal sinus tenderness.  Mouth/Throat: Oropharynx is clear and moist and mucous membranes are normal. Mucous membranes are not pale, not dry and not cyanotic. No uvula swelling. No oropharyngeal exudate, posterior oropharyngeal edema, posterior oropharyngeal erythema or tonsillar abscesses.  Eyes: Conjunctivae are normal. Right eye exhibits no discharge. Left eye exhibits no discharge. No scleral icterus.  Neck: Normal range of motion. Neck supple. No JVD present. No tracheal deviation present. No thyromegaly present.  Cardiovascular: Normal rate, regular rhythm, normal heart sounds and intact distal pulses.  Exam reveals no gallop and no friction rub.   No murmur heard. Pulmonary/Chest: Effort normal and breath sounds normal. No stridor. No respiratory distress. She has no wheezes. She has no rales. She exhibits no tenderness.  Abdominal: Soft. Bowel sounds are normal. She exhibits no distension and no mass. There is no tenderness. There is no rebound and no guarding.  Musculoskeletal: Normal range of motion. She exhibits no edema and no tenderness.  Lymphadenopathy:    She has no cervical adenopathy.  Neurological: She is oriented to person, place, and time.  Skin: Skin is warm and dry. No rash noted. She is not diaphoretic. No erythema. No pallor.  Psychiatric: She has a normal mood and affect. Her behavior is normal. Judgment and thought content normal.      Lab Results  Component Value Date   WBC 10.4* 02/04/2012   HGB 12.9 02/04/2012   HCT 38.7 02/04/2012   PLT 247 02/04/2012   GLUCOSE 105 02/04/2012   ALT 16 11/26/2010  AST 27 02/04/2012   NA 141 02/04/2012   K 4.3 02/04/2012   CL  100 02/04/2012   CREATININE 0.9 02/04/2012   BUN 24* 02/04/2012   CO2 28 02/04/2012   INR 2.2 03/13/2011      Assessment & Plan:

## 2012-02-09 NOTE — Assessment & Plan Note (Signed)
Continue current meds 

## 2012-02-09 NOTE — Assessment & Plan Note (Signed)
She has stopped her meds

## 2012-02-09 NOTE — Assessment & Plan Note (Signed)
Decrease oxycontin to BID

## 2012-02-12 ENCOUNTER — Ambulatory Visit: Payer: 59 | Admitting: Oncology

## 2012-02-15 ENCOUNTER — Encounter: Payer: Self-pay | Admitting: Oncology

## 2012-02-15 ENCOUNTER — Ambulatory Visit (HOSPITAL_BASED_OUTPATIENT_CLINIC_OR_DEPARTMENT_OTHER): Payer: 59 | Admitting: Oncology

## 2012-02-15 ENCOUNTER — Telehealth: Payer: Self-pay | Admitting: Oncology

## 2012-02-15 VITALS — BP 114/79 | HR 90 | Temp 99.4°F | Ht 67.5 in | Wt 224.1 lb

## 2012-02-15 DIAGNOSIS — Z9884 Bariatric surgery status: Secondary | ICD-10-CM

## 2012-02-15 DIAGNOSIS — C8119 Nodular sclerosis classical Hodgkin lymphoma, extranodal and solid organ sites: Secondary | ICD-10-CM

## 2012-02-15 DIAGNOSIS — C8190 Hodgkin lymphoma, unspecified, unspecified site: Secondary | ICD-10-CM

## 2012-02-15 DIAGNOSIS — F418 Other specified anxiety disorders: Secondary | ICD-10-CM

## 2012-02-15 DIAGNOSIS — Z86711 Personal history of pulmonary embolism: Secondary | ICD-10-CM

## 2012-02-15 DIAGNOSIS — C8110 Nodular sclerosis classical Hodgkin lymphoma, unspecified site: Secondary | ICD-10-CM

## 2012-02-15 DIAGNOSIS — Z8579 Personal history of other malignant neoplasms of lymphoid, hematopoietic and related tissues: Secondary | ICD-10-CM | POA: Insufficient documentation

## 2012-02-15 DIAGNOSIS — F509 Eating disorder, unspecified: Secondary | ICD-10-CM

## 2012-02-15 DIAGNOSIS — F5081 Binge eating disorder: Secondary | ICD-10-CM

## 2012-02-15 HISTORY — DX: Nodular sclerosis Hodgkin lymphoma, unspecified site: C81.10

## 2012-02-15 NOTE — Telephone Encounter (Signed)
appts made and printed for pt aom °

## 2012-02-16 ENCOUNTER — Encounter: Payer: Self-pay | Admitting: Oncology

## 2012-02-16 DIAGNOSIS — F418 Other specified anxiety disorders: Secondary | ICD-10-CM | POA: Insufficient documentation

## 2012-02-16 DIAGNOSIS — F50819 Binge eating disorder, unspecified: Secondary | ICD-10-CM

## 2012-02-16 DIAGNOSIS — F5081 Binge eating disorder: Secondary | ICD-10-CM

## 2012-02-16 HISTORY — DX: Binge eating disorder, unspecified: F50.819

## 2012-02-16 HISTORY — DX: Other specified anxiety disorders: F41.8

## 2012-02-16 HISTORY — DX: Binge eating disorder: F50.81

## 2012-02-16 NOTE — Progress Notes (Signed)
Hematology and Oncology Follow Up Visit  Joyce Payne [id number] 1961-04-22 51 y.o. 02/16/2012 10:47 AM   Principle Diagnosis: Encounter Diagnoses  Name Primary?  . Hodgkins lymphoma Yes  . Hodgkin's lymphoma, nodular sclerosis   . H/O gastric bypass   . Hx of pulmonary embolus   . Depression with anxiety   . Recurrent binge eating      Interim History: Followup visit for this 51 year old woman with stage IIA Hodgkin's lymphoma in remission.    She presented in mid-June of 2011 with a palpable lymph node mass in the right neck and supraclavicular area.  She was evaluated by Dr. Mechele Claude at the [facility] in [place].  CT scan showed additional adenopathy in the right paratracheal region.  Apparently PET scanning also showed an abnormal activity in the left hip.  Unfortunately, I do not have any reports of those images.     She was treated with 6 cycles of ABVD chemotherapy.  Due to decline in her oxygen exchange, the bleomycin had to be deleted from the last 2 cycles.  She developed an extensive cellulitis of her right calf and thigh in November of 2011.  Although lower extremity Dopplers were negative for clot, it was presumed that this was where the clot started.  She was admitted to the hospital.  Scanning incidentally showed pulmonary emboli at that time (08/25/10).  She was anticoagulated for 1 year.   She completed all planned chemotherapy approximately 09/30/10.  Interim scanning after 4 cycles showed a complete response.  restaging CT scan of the chest, abdomen and pelvis done 10/16/10 shows ongoing complete response.    An initial staging bone marrow biopsy was negative for lymphoma.  It is really not clear whether or not the abnormality in the left hip was really a focus of involvement with Hodgkin's disease.  I personally tend to doubt this.  Overall she is doing well. She has no constitutional symptoms. She did develop a pharyngitis and bronchitis about 2 months  ago and was put on a course" of antibiotics. She had surgery on a torn ligament in her left shoulder in 05/19/2012 by Dr.Beane. She had another flare up of bronchitis a week before the surgery and was put on antibiotics again. This likely explains current findings on staging CT scan which shows some areas that appear to be inflammatory but no evidence for recurrent lymphoma.   Medications: reviewed  Allergies:  Allergies  Allergen Reactions  . Povidone Rash    Review of Systems: Constitutional:   See above Respiratory: No cough or dyspnea. See discussion above 3 recent bronchitis and pharyngitis. Cardiovascular:  No chest pain or palpitations Gastrointestinal: No abdominal pain no change in bowel habit Genito-Urinary: No urinary tract symptoms Musculoskeletal: See discussion above. Recent surgery left shoulder. Neurologic: No change in usual pattern of headache or change in vision Skin: No rash or ecchymosis Remaining ROS negative.  Physical Exam: Blood pressure 114/79, pulse 90, temperature 99.4 F (37.4 C), temperature source Oral, height 5' 7.5" (1.715 m), weight 224 lb 1.6 oz (101.651 kg). Wt Readings from Last 3 Encounters:  02/15/12 224 lb 1.6 oz (101.651 kg)  02/08/12 223 lb (101.152 kg)  01/15/12 215 lb (97.523 kg)     General appearance: Well-nourished Caucasian woman; she is wearing a sling on her left arm HENNT: Pharynx no erythema or exudate Lymph nodes: No cervical supraclavicular or axillary adenopathy Breasts: Not examined today Lungs: Clear to auscultation resonant to percussion Heart: Regular rhythm no  murmur Abdomen: Soft nontender no mass no organomegaly Extremities: No edema no calf tenderness Vascular: No cyanosis Neurologic: Motor strength 5 over 5 reflexes 1+ symmetric at the knees. Not checked at the biceps and she is wearing a sling. Skin: No rash or ecchymosis  Lab Results: Lab Results  Component Value Date   WBC 10.4* 02/04/2012   HGB 12.9  02/04/2012   HCT 38.7 02/04/2012   MCV 92.8 02/04/2012   PLT 247 02/04/2012     Chemistry      Component Value Date/Time   NA 141 02/04/2012 0944   NA 143 11/26/2010 1511   K 4.3 02/04/2012 0944   K 4.1 11/26/2010 1511   CL 100 02/04/2012 0944   CL 109 11/26/2010 1511   CO2 28 02/04/2012 0944   CO2 27 11/26/2010 1511   BUN 24* 02/04/2012 0944   BUN 18 11/26/2010 1511   CREATININE 0.9 02/04/2012 0944   CREATININE 0.72 11/26/2010 1511      Component Value Date/Time   CALCIUM 8.6 02/04/2012 0944   CALCIUM 9.0 11/26/2010 1511   ALKPHOS 78 02/04/2012 0944   ALKPHOS 66 11/26/2010 1511   AST 27 02/04/2012 0944   AST 16 11/26/2010 1511   ALT 16 11/26/2010 1511   BILITOT 0.70 02/04/2012 0944   BILITOT 0.1* 11/26/2010 1511       Radiological Studies:  CT scan chest abdomen and pelvis done on 02/04/2012 which I personally reviewed shows no pathologic adenopathy. There are patchy ground glass infiltrates posteriorly in the right upper lobe and superior segment of the right lower lobe. These are felt to be inflammatory in nature. No pulmonary nodules. Incidentally noted atypical cystic changes anteriorly left femoral head unchanged from prior study. Postsurgical changes in the lumbar spine from previous laminectomy at L4-5    Impression and Plan:  1. 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  Restaging PET scan done here in [place] on 12/10/2010 showed no areas of hypermetabolic activity.  Current CT scan negative.  She is now out 2 years.  I will  decrease interval of CT scans to 1 year for the next 3 years and then annual exams, chest radiograph and lab only unless otherwise dictated by clinical symptoms. 2. Status post gastric bypass surgery for obesity. 3. Migraine headache. 4. Pulmonary emboli occurring during chemotherapy treatment in November 2011 with presumed source in the right calf where she had developed an extensive cellulitis.  Currently not on any chronic  anticoagulation. 5. Chronic anxiety and depression controlled on current antidepressant regimen. 6. Binge eating partially controlled with Topamax 7. Recent orthopedic surgery August 2013 left shoulder 8. 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 Dr. Sanda Linger; Dr. Mechele Claude fax [phone]   Levert Feinstein, MD 5/7/201310:47 AM

## 2012-03-08 ENCOUNTER — Other Ambulatory Visit: Payer: Self-pay

## 2012-03-08 DIAGNOSIS — M545 Low back pain: Secondary | ICD-10-CM

## 2012-03-08 DIAGNOSIS — M25512 Pain in left shoulder: Secondary | ICD-10-CM

## 2012-03-08 DIAGNOSIS — G8929 Other chronic pain: Secondary | ICD-10-CM

## 2012-03-08 DIAGNOSIS — M7580 Other shoulder lesions, unspecified shoulder: Secondary | ICD-10-CM

## 2012-03-08 DIAGNOSIS — M1990 Unspecified osteoarthritis, unspecified site: Secondary | ICD-10-CM

## 2012-03-08 MED ORDER — OXYCODONE-ACETAMINOPHEN 5-325 MG PO TABS: 2.0000 | ORAL_TABLET | ORAL | Status: DC | PRN

## 2012-03-08 NOTE — Telephone Encounter (Signed)
Pt called requesting 2 mth refills to last until next appt with [facility]

## 2012-03-08 NOTE — Telephone Encounter (Signed)
Pt advised per Alvy Beal, CMA

## 2012-04-04 ENCOUNTER — Other Ambulatory Visit: Payer: Self-pay

## 2012-04-04 DIAGNOSIS — M1990 Unspecified osteoarthritis, unspecified site: Secondary | ICD-10-CM

## 2012-04-04 DIAGNOSIS — M7580 Other shoulder lesions, unspecified shoulder: Secondary | ICD-10-CM

## 2012-04-04 DIAGNOSIS — G8929 Other chronic pain: Secondary | ICD-10-CM

## 2012-04-04 DIAGNOSIS — M545 Low back pain: Secondary | ICD-10-CM

## 2012-04-04 MED ORDER — OXYCODONE-ACETAMINOPHEN 5-325 MG PO TABS: 2.0000 | ORAL_TABLET | ORAL | Status: DC | PRN

## 2012-04-04 NOTE — Telephone Encounter (Signed)
Patient notified

## 2012-04-28 ENCOUNTER — Ambulatory Visit (INDEPENDENT_AMBULATORY_CARE_PROVIDER_SITE_OTHER): Payer: 59 | Admitting: Internal Medicine

## 2012-04-28 ENCOUNTER — Encounter: Payer: Self-pay | Admitting: Internal Medicine

## 2012-04-28 VITALS — BP 102/74 | HR 100 | Temp 98.8°F | Resp 20 | Wt 235.5 lb

## 2012-04-28 DIAGNOSIS — M1990 Unspecified osteoarthritis, unspecified site: Secondary | ICD-10-CM

## 2012-04-28 DIAGNOSIS — M545 Low back pain: Secondary | ICD-10-CM

## 2012-04-28 DIAGNOSIS — N3281 Overactive bladder: Secondary | ICD-10-CM | POA: Insufficient documentation

## 2012-04-28 DIAGNOSIS — N318 Other neuromuscular dysfunction of bladder: Secondary | ICD-10-CM

## 2012-04-28 DIAGNOSIS — M25512 Pain in left shoulder: Secondary | ICD-10-CM

## 2012-04-28 DIAGNOSIS — M7580 Other shoulder lesions, unspecified shoulder: Secondary | ICD-10-CM

## 2012-04-28 DIAGNOSIS — G8929 Other chronic pain: Secondary | ICD-10-CM

## 2012-04-28 MED ORDER — MIRABEGRON ER 25 MG PO TB24: 25.0000 mg | ORAL_TABLET | Freq: Every day | ORAL | Status: DC

## 2012-04-28 MED ORDER — OXYCODONE HCL 40 MG PO TB12: 40.0000 mg | ORAL_TABLET | Freq: Two times a day (BID) | ORAL | Status: DC

## 2012-04-28 NOTE — Patient Instructions (Signed)
Overactive Bladder, Adult  The bladder has two functions that are totally opposite of the other. One is to relax and stretch out so it can store urine (fills like a balloon), and the other is to contract and squeeze down so that it can empty the urine that it has stored. Proper functioning of the bladder is a complex mixing of these two functions. The filling and emptying of the bladder can be influenced by:   The bladder.    The spinal cord.    The brain.    The nerves going to the bladder.    Other organs that are closely related to the bladder such as prostate in males and the vagina in females.   As your bladder fills with urine, nerve signals are sent from the bladder to the brain to tell you that you may need to urinate. Normal urination requires that the bladder squeeze down with sufficient strength to empty the bladder, but this also requires that the bladder squeeze down sufficiently long to finish the job. In addition the sphincter muscles, which normally keep you from leaking urine, must also relax so that the urine can pass. Coordination between the bladder muscle squeezing down and the sphincter muscles relaxing is required to make everything happen normally.  With an overactive bladder sometimes the muscles of the bladder contract unexpectedly and involuntarily and this causes an urgent need to urinate. The normal response is to try to hold urine in by contracting the sphincter muscles. Sometimes the bladder contracts so strongly that the sphincter muscles cannot stop the urine from passing out and incontinence occurs. This kind of incontinence is called urge incontinence.  Having an overactive bladder can be embarrassing and awkward. It can keep you from living life the way you want to. Many people think it is just something you have to put up with as you grow older or have certain health conditions. In fact, there are treatments that can help make your life easier and more pleasant.  CAUSES     Many things can cause an overactive bladder. Possibilities include:   Urinary tract infection or infection of nearby tissues such as the prostate.    Prostate enlargement.    In women, multiple pregnancies or surgery on the uterus or urethra.    Bladder stones, inflammation or tumors.    Caffeine.    Alcohol.    Medications. For example, diuretics (drugs that help the body get rid of extra fluid) increase urine production. Some other medicines must be taken with lots of fluids.    Muscle or nerve weakness. This might be the result of a spinal cord injury, a stroke, multiple sclerosis or Parkinson's disease.    Diabetes can cause a high urine volume which fills the bladder so quickly that the normal urge to urinate is triggered very strongly.   SYMPTOMS     Loss of bladder control. You feel the need to urinate and cannot make your body wait.    Sudden, strong urges to urinate.    Urinating 8 or more times a day.    Waking up to urinate two or more times a night.   DIAGNOSIS    To decide if you have overactive bladder, your healthcare provider will probably:   Ask about symptoms you have noticed.    Ask about your overall health. This will include questions about any medications you are taking.    Do a physical examination. This will help determine if there are   obvious blockages or other problems.    Order some tests. These might include:    A blood test to check for diabetes or other health issues that could be contributing to the problem.    Urine testing. This could measure the flow of urine and the pressure on the bladder.    A test of your neurological system (the brain, spinal cord and nerves). This is the system that senses the need to urinate. Some of these tests are called flow tests, bladder pressure tests and electrical measurements of the sphincter muscle.    A bladder test to check whether it is emptying completely when you urinate.     Cytoscopy. This test uses a thin tube with a tiny camera on it. It offers a look inside your urethra and bladder to see if there are problems.    Imaging tests. You might be given a contrast dye and then asked to urinate. X-rays are taken to see how your bladder is working.   TREATMENT    An overactive bladder can be treated in many ways. The treatment will depend on the cause. Whether you have a mild or severe case also makes a difference. Often, treatment can be given in your healthcare provider's office or clinic. Be sure to discuss the different options with your caregiver. They include:   Behavioral treatments. These do not involve medication or surgery:    Bladder training. For this, you would follow a schedule to urinate at regular intervals. This helps you learn to control the urge to urinate. At first, you might be asked to wait a few minutes after feeling the urge. In time, you should be able to schedule bathroom visits an hour or more apart.    Kegel exercises. These exercises strengthen the pelvic floor muscles, which support the bladder. By toning these muscles, they can help control urination, even if the bladder muscles are overactive. A specialist will teach you how to do these exercises correctly. They will require daily practice.    Weight loss. If you are obese or overweight, losing weight might stop your bladder from being overactive. Talk to your healthcare provider about how many pounds you should lose. Also ask if there is a specific program or method that would work best for you.    Diet change. This might be suggested if constipation is making your overactive bladder worse. Your healthcare provider or a nutritionist can explain ways to change what you eat to ease constipation. Other people might need to take in less caffeine or alcohol. Sometimes drinking fewer fluids is needed, too.     Protection. This is not an actual treatment. But, you could wear special pads to take care of any leakage while you wait for other treatments to take effect. This will help you avoid embarrassment.    Physical treatments.    Electrical stimulation. Electrodes will send gentle pulses to the nerves or muscles that help control the bladder. The goal is to strengthen them. Sometimes this is done with the electrodes outside of the body. Or, they might be placed inside the body (implanted). This treatment can take several months to have an effect.    Medications. These are usually used along with other treatments. Several medicines are available. Some are injected into the muscles involved in urination. Others come in pill form. Medications sometimes prescribed include:    Anticholinergics. These drugs block the signals that the nerves deliver to the bladder. This keeps it from releasing urine   at the wrong time. Researchers think the drugs might help in other ways, too.    Imipramine. This is an antidepressant. But, it relaxes bladder muscles.    Botox. This is still experimental. Some people believe that injecting it into the bladder muscles will relax them so they work more normally. It has also been injected into the sphincter muscle when the sphincter muscle does not open properly. This is a temporary fix, however. Also, it might make matters worse, especially in older people.    Surgery.    A device might be implanted to help manage your nerves. It works on the nerves that signal when you need to urinate.    Surgery is sometimes needed with electrical stimulation. If the electrodes are implanted, this is done through surgery.    Sometimes repairs need to be made through surgery. For example, the size of the bladder can be changed. This is usually done in severe cases only.   HOME CARE INSTRUCTIONS      Take any medications your healthcare provider prescribed or suggested. Follow the directions carefully.    Practice any lifestyle changes that are recommended. These might include:    Drinking less fluid or drinking at different times of the day. If you need to urinate often during the night, for example, you may need to stop drinking fluids early in the evening.    Cutting down on caffeine or alcohol. They can both make an overactive bladder worse. Caffeine is found in coffee, tea and sodas.    Doing Kegel exercises to strengthen muscles.    Losing weight, if that is recommended.    Eating a healthy and balanced diet. This will help you avoid constipation.    Keep a journal or a log. You might be asked to record how much you drink and when, and also when you feel the need to urinate.    Learn how to care for implants or other devices, such as pessaries.   SEEK MEDICAL CARE IF:     Your overactive bladder gets worse.    You feel increased pain or irritation when you urinate.    You notice blood in your urine.    You have questions about any medications or devices that your healthcare provider recommended.    You notice blood, pus or swelling at the site of any test or treatment procedure.    You have an oral temperature above 102 F (38.9 C).   SEEK IMMEDIATE MEDICAL CARE IF:    You have an oral temperature above 102 F (38.9 C), not controlled by medicine.  Document Released: 07/25/2009 Document Revised: 09/17/2011 Document Reviewed: 07/25/2009  [employer] Patient Information 2012 [employer].

## 2012-04-29 ENCOUNTER — Ambulatory Visit: Payer: 59 | Admitting: Internal Medicine

## 2012-05-01 ENCOUNTER — Encounter: Payer: Self-pay | Admitting: Internal Medicine

## 2012-05-01 NOTE — Assessment & Plan Note (Signed)
She is ready to stop percocet but she needs to continue the oxycontin

## 2012-05-01 NOTE — Progress Notes (Signed)
  Subjective:    Patient ID: Joyce Payne, female    DOB: [birth year], 51 y.o.   MRN: [id number]  Arthritis Presents for follow-up visit. She complains of pain. She reports no stiffness, joint swelling or joint warmth. Affected locations include the left shoulder, left knee and right knee. Her pain is at a severity of 6/10. Associated symptoms include pain at night. Pertinent negatives include no diarrhea, dry eyes, dry mouth, dysuria, fatigue, fever, pain while resting, rash, Raynaud's syndrome, uveitis or weight loss. Compliance with total regimen is 76-100%.      Review of Systems  Constitutional: Negative for fever, chills, weight loss, diaphoresis, activity change, appetite change, fatigue and unexpected weight change.  HENT: Negative.   Eyes: Negative.   Respiratory: Negative for apnea, cough, choking, chest tightness, shortness of breath, wheezing and stridor.   Cardiovascular: Negative for chest pain, palpitations and leg swelling.  Gastrointestinal: Negative for nausea, vomiting, abdominal pain, diarrhea, constipation, blood in stool and anal bleeding.  Genitourinary: Positive for urgency, difficulty urinating (frequency and incontinence) and dyspareunia (vaginal dryness). Negative for dysuria, frequency, hematuria, flank pain, decreased urine volume, vaginal bleeding, vaginal discharge, enuresis, vaginal pain, menstrual problem and pelvic pain.  Musculoskeletal: Positive for back pain (chronic, unchanged), arthralgias and arthritis. Negative for joint swelling, gait problem and stiffness.  Skin: Negative for color change, pallor, rash and wound.  Neurological: Negative.   Hematological: Negative for adenopathy. Does not bruise/bleed easily.  Psychiatric/Behavioral: Negative.        Objective:   Physical Exam  Vitals reviewed. Constitutional: She is oriented to person, place, and time. She appears well-developed and well-nourished. No distress.  HENT:  Head: Normocephalic and  atraumatic.  Mouth/Throat: Oropharynx is clear and moist. No oropharyngeal exudate.  Eyes: Conjunctivae are normal. Right eye exhibits no discharge. Left eye exhibits no discharge. No scleral icterus.  Neck: Normal range of motion. Neck supple. No JVD present. No tracheal deviation present. No thyromegaly present.  Cardiovascular: Normal rate, normal heart sounds and intact distal pulses.  Exam reveals no gallop and no friction rub.   No murmur heard. Pulmonary/Chest: Effort normal and breath sounds normal. No stridor. No respiratory distress. She has no wheezes. She has no rales. She exhibits no tenderness.  Abdominal: Soft. Bowel sounds are normal. She exhibits no distension. There is no tenderness. There is no rebound and no guarding.  Musculoskeletal: Normal range of motion. She exhibits no edema and no tenderness.  Lymphadenopathy:    She has no cervical adenopathy.  Neurological: She is oriented to person, place, and time.  Skin: Skin is warm and dry. No rash noted. She is not diaphoretic. No erythema. No pallor.  Psychiatric: She has a normal mood and affect. Her behavior is normal. Judgment and thought content normal.          Assessment & Plan:

## 2012-05-01 NOTE — Assessment & Plan Note (Signed)
She will try Myrbetriq

## 2012-05-01 NOTE — Assessment & Plan Note (Signed)
Continue current pain meds with no changes

## 2012-07-11 ENCOUNTER — Other Ambulatory Visit: Payer: Self-pay

## 2012-07-11 DIAGNOSIS — N3281 Overactive bladder: Secondary | ICD-10-CM

## 2012-07-11 MED ORDER — MIRABEGRON ER 25 MG PO TB24: 25.0000 mg | ORAL_TABLET | Freq: Every day | ORAL | Status: DC

## 2012-07-25 ENCOUNTER — Ambulatory Visit: Payer: 59 | Admitting: Internal Medicine

## 2012-07-26 ENCOUNTER — Other Ambulatory Visit (INDEPENDENT_AMBULATORY_CARE_PROVIDER_SITE_OTHER): Payer: 59

## 2012-07-26 ENCOUNTER — Ambulatory Visit (INDEPENDENT_AMBULATORY_CARE_PROVIDER_SITE_OTHER): Payer: 59 | Admitting: Internal Medicine

## 2012-07-26 ENCOUNTER — Encounter: Payer: Self-pay | Admitting: Internal Medicine

## 2012-07-26 VITALS — BP 112/82 | HR 91 | Temp 98.0°F | Resp 16 | Ht 67.5 in | Wt 247.5 lb

## 2012-07-26 DIAGNOSIS — Z0000 Encounter for general adult medical examination without abnormal findings: Secondary | ICD-10-CM | POA: Insufficient documentation

## 2012-07-26 DIAGNOSIS — N3281 Overactive bladder: Secondary | ICD-10-CM

## 2012-07-26 DIAGNOSIS — Z9884 Bariatric surgery status: Secondary | ICD-10-CM

## 2012-07-26 DIAGNOSIS — F5089 Other specified eating disorder: Secondary | ICD-10-CM

## 2012-07-26 DIAGNOSIS — F5081 Binge eating disorder: Secondary | ICD-10-CM

## 2012-07-26 DIAGNOSIS — G8929 Other chronic pain: Secondary | ICD-10-CM

## 2012-07-26 DIAGNOSIS — A084 Viral intestinal infection, unspecified: Secondary | ICD-10-CM

## 2012-07-26 DIAGNOSIS — M545 Low back pain: Secondary | ICD-10-CM

## 2012-07-26 DIAGNOSIS — M1990 Unspecified osteoarthritis, unspecified site: Secondary | ICD-10-CM

## 2012-07-26 DIAGNOSIS — N318 Other neuromuscular dysfunction of bladder: Secondary | ICD-10-CM

## 2012-07-26 DIAGNOSIS — Z1231 Encounter for screening mammogram for malignant neoplasm of breast: Secondary | ICD-10-CM

## 2012-07-26 DIAGNOSIS — A088 Other specified intestinal infections: Secondary | ICD-10-CM

## 2012-07-26 LAB — LIPID PANEL
Cholesterol: 155 mg/dL (ref 0–200)
Triglycerides: 96 mg/dL (ref 0.0–149.0)
VLDL: 19.2 mg/dL (ref 0.0–40.0)

## 2012-07-26 LAB — CBC WITH DIFFERENTIAL/PLATELET
Basophils Absolute: 0 10*3/uL (ref 0.0–0.1)
HCT: 36.7 % (ref 36.0–46.0)
Lymphocytes Relative: 21 % (ref 12.0–46.0)
Lymphs Abs: 1.9 10*3/uL (ref 0.7–4.0)
Monocytes Relative: 7.1 % (ref 3.0–12.0)
Neutrophils Relative %: 71 % (ref 43.0–77.0)
Platelets: 231 10*3/uL (ref 150.0–400.0)
RDW: 12.9 % (ref 11.5–14.6)
WBC: 9 10*3/uL (ref 4.5–10.5)

## 2012-07-26 LAB — COMPREHENSIVE METABOLIC PANEL
ALT: 21 U/L (ref 0–35)
Albumin: 3.3 g/dL — ABNORMAL LOW (ref 3.5–5.2)
CO2: 26 mEq/L (ref 19–32)
Calcium: 8.6 mg/dL (ref 8.4–10.5)
Chloride: 110 mEq/L (ref 96–112)
GFR: 85.12 mL/min (ref >60.00)
Glucose, Bld: 93 mg/dL (ref 70–99)
Potassium: 4.2 mEq/L (ref 3.5–5.1)
Sodium: 143 mEq/L (ref 135–145)
Total Protein: 6.1 g/dL (ref 6.0–8.3)

## 2012-07-26 LAB — IBC PANEL: Transferrin: 206.4 mg/dL — ABNORMAL LOW (ref 212.0–360.0)

## 2012-07-26 LAB — VITAMIN B12: Vitamin B-12: 1149 pg/mL — ABNORMAL HIGH (ref 211–911)

## 2012-07-26 LAB — TSH: TSH: 4 u[IU]/mL (ref 0.35–5.50)

## 2012-07-26 LAB — FOLATE: Folate: >24.8 ng/mL (ref >5.9)

## 2012-07-26 MED ORDER — OXYCODONE HCL 20 MG PO TB12: 20.0000 mg | ORAL_TABLET | Freq: Two times a day (BID) | ORAL | Status: DC

## 2012-07-26 MED ORDER — MIRABEGRON ER 50 MG PO TB24: 50.0000 mg | ORAL_TABLET | Freq: Every day | ORAL | Status: DC

## 2012-07-26 MED ORDER — TOPIRAMATE 100 MG PO TABS: 100.0000 mg | ORAL_TABLET | Freq: Two times a day (BID) | ORAL | Status: DC

## 2012-07-26 NOTE — Patient Instructions (Signed)
Viral Gastroenteritis Viral gastroenteritis is also known as stomach flu. This condition affects the stomach and intestinal tract. It can cause sudden diarrhea and vomiting. The illness typically lasts 3 to 8 days. Most people develop an immune response that eventually gets rid of the virus. While this natural response develops, the virus can make you quite ill. CAUSES  Many different viruses can cause gastroenteritis, such as rotavirus or noroviruses. You can catch one of these viruses by consuming contaminated food or water. You may also catch a virus by sharing utensils or other personal items with an infected person or by touching a contaminated surface. SYMPTOMS  The most common symptoms are diarrhea and vomiting. These problems can cause a severe loss of body fluids (dehydration) and a body salt (electrolyte) imbalance. Other symptoms may include:  Fever.  Headache.  Fatigue.  Abdominal pain. DIAGNOSIS  Your caregiver can usually diagnose viral gastroenteritis based on your symptoms and a physical exam. A stool sample may also be taken to test for the presence of viruses or other infections. TREATMENT  This illness typically goes away on its own. Treatments are aimed at rehydration. The most serious cases of viral gastroenteritis involve vomiting so severely that you are not able to keep fluids down. In these cases, fluids must be given through an intravenous line (IV). HOME CARE INSTRUCTIONS   Drink enough fluids to keep your urine clear or pale yellow. Drink small amounts of fluids frequently and increase the amounts as tolerated.  Ask your caregiver for specific rehydration instructions.  Avoid:  Foods high in sugar.  Alcohol.  Carbonated drinks.  Tobacco.  Juice.  Caffeine drinks.  Extremely hot or cold fluids.  Fatty, greasy foods.  Too much intake of anything at one time.  Dairy products until 24 to 48 hours after diarrhea stops.  You may consume probiotics.  Probiotics are active cultures of beneficial bacteria. They may lessen the amount and number of diarrheal stools in adults. Probiotics can be found in yogurt with active cultures and in supplements.  Wash your hands well to avoid spreading the virus.  Only take over-the-counter or prescription medicines for pain, discomfort, or fever as directed by your caregiver. Do not give aspirin to children. Antidiarrheal medicines are not recommended.  Ask your caregiver if you should continue to take your regular prescribed and over-the-counter medicines.  Keep all follow-up appointments as directed by your caregiver. SEEK IMMEDIATE MEDICAL CARE IF:   You are unable to keep fluids down.  You do not urinate at least once every 6 to 8 hours.  You develop shortness of breath.  You notice blood in your stool or vomit. This may look like coffee grounds.  You have abdominal pain that increases or is concentrated in one small area (localized).  You have persistent vomiting or diarrhea.  You have a fever.  The patient is a child younger than 3 months, and he or she has a fever.  The patient is a child older than 3 months, and he or she has a fever and persistent symptoms.  The patient is a child older than 3 months, and he or she has a fever and symptoms suddenly get worse.  The patient is a baby, and he or she has no tears when crying. MAKE SURE YOU:   Understand these instructions.  Will watch your condition.  Will get help right away if you are not doing well or get worse. Document Released: 09/28/2005 Document Revised: 12/21/2011 Document Reviewed: 07/15/2011   [employer] Patient Information 2013 [employer].  

## 2012-07-26 NOTE — Progress Notes (Signed)
Subjective:    Patient ID: Joyce Payne, female    DOB: 1961-03-11, 51 y.o.   MRN: [id number]  Diarrhea  This is a new problem. The current episode started in the past 7 days. The problem occurs less than 2 times per day. The problem has been gradually improving. The stool consistency is described as watery. The patient states that diarrhea does not awaken her from sleep. Associated symptoms include arthralgias (right knee), chills and a fever. Pertinent negatives include no abdominal pain, bloating, coughing, headaches, increased  flatus, myalgias, sweats, URI, vomiting or weight loss. Nothing aggravates the symptoms. Risk factors include ill contacts. She has tried nothing for the symptoms. Her past medical history is significant for malabsorption.      Review of Systems  Constitutional: Positive for fever and chills. Negative for weight loss, diaphoresis, activity change, appetite change, fatigue and unexpected weight change.  HENT: Negative.  Negative for congestion, sore throat, rhinorrhea, voice change and postnasal drip.   Eyes: Negative.   Respiratory: Negative for apnea, cough, choking, chest tightness, shortness of breath, wheezing and stridor.   Cardiovascular: Negative for chest pain, palpitations and leg swelling.  Gastrointestinal: Positive for diarrhea. Negative for nausea, vomiting, abdominal pain, constipation, blood in stool, abdominal distention, anal bleeding, rectal pain, bloating and flatus.  Genitourinary: Positive for urgency, frequency and difficulty urinating (some urgency and incontinence). Negative for dysuria, hematuria, flank pain, enuresis and menstrual problem.  Musculoskeletal: Positive for back pain (chronic, unchanged) and arthralgias (right knee). Negative for myalgias, joint swelling and gait problem.  Skin: Negative for color change, pallor, rash and wound.  Neurological: Negative.  Negative for dizziness, tremors, weakness, light-headedness and headaches.    Hematological: Negative for adenopathy. Does not bruise/bleed easily.  Psychiatric/Behavioral: Negative.        Objective:   Physical Exam  Vitals reviewed. Constitutional: She is oriented to person, place, and time. She appears well-developed and well-nourished.  Non-toxic appearance. She does not have a sickly appearance. She does not appear ill. No distress.  HENT:  Head: Normocephalic and atraumatic.  Mouth/Throat: Oropharynx is clear and moist. No oropharyngeal exudate.  Eyes: Conjunctivae normal are normal. Right eye exhibits no discharge. Left eye exhibits no discharge. No scleral icterus.  Neck: Normal range of motion. Neck supple. No JVD present. No tracheal deviation present. No thyromegaly present.  Cardiovascular: Normal rate, regular rhythm, normal heart sounds and intact distal pulses.  Exam reveals no gallop and no friction rub.   No murmur heard. Pulmonary/Chest: Effort normal and breath sounds normal. No stridor. No respiratory distress. She has no wheezes. She has no rales. She exhibits no tenderness.  Abdominal: Soft. Bowel sounds are normal. She exhibits no distension and no mass. There is no tenderness. There is no rebound and no guarding.  Musculoskeletal: Normal range of motion. She exhibits no edema and no tenderness.  Lymphadenopathy:    She has no cervical adenopathy.  Neurological: She is oriented to person, place, and time.  Skin: Skin is warm and dry. No rash noted. She is not diaphoretic. No erythema. No pallor.  Psychiatric: She has a normal mood and affect. Her behavior is normal. Judgment and thought content normal.      Lab Results  Component Value Date   WBC 10.4* 02/04/2012   HGB 12.9 02/04/2012   HCT 38.7 02/04/2012   PLT 247 02/04/2012   GLUCOSE 105 02/04/2012   ALT 16 11/26/2010   AST 27 02/04/2012   NA 141 02/04/2012   K  4.3 02/04/2012   CL 100 02/04/2012   CREATININE 0.9 02/04/2012   BUN 24* 02/04/2012   CO2 28 02/04/2012   INR 2.2 03/13/2011       Assessment & Plan:

## 2012-07-27 ENCOUNTER — Encounter: Payer: Self-pay | Admitting: Internal Medicine

## 2012-07-29 ENCOUNTER — Encounter: Payer: Self-pay | Admitting: Internal Medicine

## 2012-07-29 DIAGNOSIS — A084 Viral intestinal infection, unspecified: Secondary | ICD-10-CM | POA: Insufficient documentation

## 2012-07-29 NOTE — Assessment & Plan Note (Signed)
This appears to be resolving, she feels much better today, I will check her labs to look for complications

## 2012-07-29 NOTE — Assessment & Plan Note (Signed)
Continue oxycontin as needed for pain

## 2012-07-29 NOTE — Assessment & Plan Note (Signed)
I will check her labs today to look for malabsorption and other complications

## 2012-07-29 NOTE — Assessment & Plan Note (Signed)
Continue oxycontin

## 2012-07-29 NOTE — Assessment & Plan Note (Signed)
Myrbetriq helped quite a bit, I will see if a higher dose will help her more

## 2012-07-29 NOTE — Assessment & Plan Note (Signed)
Exam done, vaccines were updated, labs and mammo ordered

## 2012-10-20 ENCOUNTER — Ambulatory Visit (INDEPENDENT_AMBULATORY_CARE_PROVIDER_SITE_OTHER): Payer: 59 | Admitting: Internal Medicine

## 2012-10-20 ENCOUNTER — Encounter: Payer: Self-pay | Admitting: Internal Medicine

## 2012-10-20 VITALS — BP 108/78 | HR 90 | Temp 98.5°F | Resp 16 | Wt 253.0 lb

## 2012-10-20 DIAGNOSIS — E611 Iron deficiency: Secondary | ICD-10-CM | POA: Insufficient documentation

## 2012-10-20 DIAGNOSIS — M1990 Unspecified osteoarthritis, unspecified site: Secondary | ICD-10-CM

## 2012-10-20 DIAGNOSIS — D509 Iron deficiency anemia, unspecified: Secondary | ICD-10-CM

## 2012-10-20 DIAGNOSIS — M545 Low back pain: Secondary | ICD-10-CM

## 2012-10-20 DIAGNOSIS — F5089 Other specified eating disorder: Secondary | ICD-10-CM

## 2012-10-20 DIAGNOSIS — F5081 Binge eating disorder: Secondary | ICD-10-CM

## 2012-10-20 DIAGNOSIS — G8929 Other chronic pain: Secondary | ICD-10-CM

## 2012-10-20 MED ORDER — FE FUM-VIT C-VIT B12-FA 460-60-0.01-1 MG PO CAPS: 1.0000 | ORAL_CAPSULE | Freq: Every day | ORAL | Status: DC

## 2012-10-20 MED ORDER — OXYCODONE HCL 20 MG PO TB12: 20.0000 mg | ORAL_TABLET | Freq: Two times a day (BID) | ORAL | Status: DC

## 2012-10-20 MED ORDER — TOPIRAMATE 100 MG PO TABS: 100.0000 mg | ORAL_TABLET | Freq: Two times a day (BID) | ORAL | Status: DC

## 2012-10-20 NOTE — Progress Notes (Signed)
Subjective:    Patient ID: Joyce Payne, female    DOB: 09-Oct-1961, 52 y.o.   MRN: [id number]  Arthritis Presents for follow-up visit. She complains of pain. She reports no stiffness, joint swelling or joint warmth. Affected locations include the right knee. Her pain is at a severity of 6/10. Associated symptoms include fatigue. Pertinent negatives include no diarrhea, dry eyes, dry mouth, dysuria, fever, pain at night, pain while resting, rash, Raynaud's syndrome, uveitis or weight loss. Compliance with total regimen is 76-100%.      Review of Systems  Constitutional: Positive for fatigue. Negative for fever, chills, weight loss, diaphoresis, activity change, appetite change and unexpected weight change.  HENT: Negative.  Negative for nosebleeds.   Eyes: Negative.   Respiratory: Negative for apnea, cough, choking, shortness of breath, wheezing and stridor.   Cardiovascular: Negative for chest pain, palpitations and leg swelling.  Gastrointestinal: Negative for nausea, vomiting, abdominal pain, diarrhea, blood in stool and anal bleeding.  Genitourinary: Negative.  Negative for dysuria and vaginal bleeding.  Musculoskeletal: Positive for back pain, arthralgias and arthritis. Negative for myalgias, joint swelling, gait problem and stiffness.  Skin: Negative for color change, pallor, rash and wound.  Neurological: Negative for dizziness, syncope, weakness and light-headedness.  Hematological: Negative for adenopathy. Does not bruise/bleed easily.  Psychiatric/Behavioral: Negative.        Objective:   Physical Exam  Vitals reviewed. Constitutional: She is oriented to person, place, and time. She appears well-developed and well-nourished. No distress.  HENT:  Head: Normocephalic and atraumatic.  Mouth/Throat: Oropharynx is clear and moist. No oropharyngeal exudate.  Eyes: Conjunctivae normal are normal. Right eye exhibits no discharge. Left eye exhibits no discharge. No scleral icterus.    Neck: Normal range of motion. Neck supple. No JVD present. No tracheal deviation present. No thyromegaly present.  Cardiovascular: Normal rate, regular rhythm, normal heart sounds and intact distal pulses.  Exam reveals no gallop and no friction rub.   No murmur heard. Pulmonary/Chest: Effort normal and breath sounds normal. No stridor. No respiratory distress. She has no wheezes. She has no rales. She exhibits no tenderness.  Abdominal: Soft. Bowel sounds are normal. She exhibits no distension and no mass. There is no tenderness. There is no rebound and no guarding.  Musculoskeletal: Normal range of motion. She exhibits no edema and no tenderness.       Right knee: Normal. She exhibits no swelling and no deformity. no tenderness found.       Lumbar back: Normal.  Lymphadenopathy:    She has no cervical adenopathy.  Neurological: She is oriented to person, place, and time.  Skin: Skin is warm and dry. No rash noted. She is not diaphoretic. No erythema. No pallor.  Psychiatric: She has a normal mood and affect. Her behavior is normal. Judgment and thought content normal.     Lab Results  Component Value Date   WBC 9.0 07/26/2012   HGB 12.3 07/26/2012   HCT 36.7 07/26/2012   PLT 231.0 07/26/2012   GLUCOSE 93 07/26/2012   CHOL 155 07/26/2012   TRIG 96.0 07/26/2012   HDL 60.30 07/26/2012   LDLCALC 76 07/26/2012   ALT 21 07/26/2012   AST 19 07/26/2012   NA 143 07/26/2012   K 4.2 07/26/2012   CL 110 07/26/2012   CREATININE 0.8 07/26/2012   BUN 16 07/26/2012   CO2 26 07/26/2012   TSH 4.00 07/26/2012   INR 2.2 03/13/2011       Assessment & Plan:

## 2012-10-20 NOTE — Patient Instructions (Signed)

## 2012-10-22 NOTE — Assessment & Plan Note (Signed)
Continue oxycontin as needed 

## 2012-10-22 NOTE — Assessment & Plan Note (Signed)
Continue current meds for pain 

## 2012-10-22 NOTE — Assessment & Plan Note (Signed)
Continue oxycontin as needed

## 2012-10-22 NOTE — Assessment & Plan Note (Signed)
She was told there her iron level is low by her GYN Will start iron replacement therapy

## 2012-11-04 ENCOUNTER — Ambulatory Visit (INDEPENDENT_AMBULATORY_CARE_PROVIDER_SITE_OTHER): Payer: 59 | Admitting: Internal Medicine

## 2012-11-04 ENCOUNTER — Encounter: Payer: Self-pay | Admitting: Internal Medicine

## 2012-11-04 VITALS — BP 110/58 | HR 89 | Temp 97.1°F | Resp 10 | Wt 253.0 lb

## 2012-11-04 DIAGNOSIS — J019 Acute sinusitis, unspecified: Secondary | ICD-10-CM

## 2012-11-04 MED ORDER — CEFUROXIME AXETIL 500 MG PO TABS: 500.0000 mg | ORAL_TABLET | Freq: Two times a day (BID) | ORAL | Status: DC

## 2012-11-04 NOTE — Patient Instructions (Signed)

## 2012-11-04 NOTE — Assessment & Plan Note (Signed)
Start ceftin for the infection 

## 2012-11-04 NOTE — Progress Notes (Signed)
Subjective:    Patient ID: Joyce Payne, female    DOB: 05-22-1961, 52 y.o.   MRN: [id number]  URI  This is a new problem. The current episode started in the past 7 days. The problem has been gradually worsening. There has been no fever. Associated symptoms include congestion, rhinorrhea, sinus pain and a sore throat. Pertinent negatives include no abdominal pain, chest pain, coughing, diarrhea, dysuria, ear pain, headaches, joint pain, joint swelling, nausea, neck pain, plugged ear sensation, rash, sneezing, swollen glands, vomiting or wheezing. She has tried decongestant and antihistamine for the symptoms. The treatment provided mild relief.      Review of Systems  Constitutional: Negative for fever, chills, diaphoresis, activity change, appetite change, fatigue and unexpected weight change.  HENT: Positive for congestion, sore throat, rhinorrhea, postnasal drip and sinus pressure. Negative for hearing loss, ear pain, nosebleeds, facial swelling, sneezing, trouble swallowing, neck pain and voice change.   Eyes: Negative.   Respiratory: Negative for cough, chest tightness, shortness of breath, wheezing and stridor.   Cardiovascular: Negative for chest pain, palpitations and leg swelling.  Gastrointestinal: Negative for nausea, vomiting, abdominal pain and diarrhea.  Genitourinary: Negative.  Negative for dysuria.  Musculoskeletal: Positive for arthralgias. Negative for myalgias, back pain, joint pain, joint swelling and gait problem.  Skin: Negative for color change, pallor, rash and wound.  Neurological: Negative.  Negative for headaches.  Hematological: Negative for adenopathy. Does not bruise/bleed easily.  Psychiatric/Behavioral: Negative.        Objective:   Physical Exam  Vitals reviewed. Constitutional: She is oriented to person, place, and time. She appears well-developed and well-nourished.  Non-toxic appearance. She does not have a sickly appearance. She does not appear ill.  No distress.  HENT:  Head: Normocephalic and atraumatic. No trismus in the jaw.  Right Ear: Hearing, tympanic membrane, external ear and ear canal normal.  Left Ear: Hearing, tympanic membrane, external ear and ear canal normal.  Nose: Mucosal edema and rhinorrhea present. No sinus tenderness. Right sinus exhibits maxillary sinus tenderness. Right sinus exhibits no frontal sinus tenderness. Left sinus exhibits maxillary sinus tenderness. Left sinus exhibits no frontal sinus tenderness.  Mouth/Throat: Oropharynx is clear and moist and mucous membranes are normal. Mucous membranes are not pale, not dry and not cyanotic. No oral lesions. No uvula swelling. No oropharyngeal exudate, posterior oropharyngeal edema, posterior oropharyngeal erythema or tonsillar abscesses.  Eyes: Conjunctivae normal are normal. Right eye exhibits no discharge. Left eye exhibits no discharge. No scleral icterus.  Neck: Normal range of motion. Neck supple. No JVD present. No tracheal deviation present. No thyromegaly present.  Cardiovascular: Normal rate, regular rhythm, normal heart sounds and intact distal pulses.  Exam reveals no gallop and no friction rub.   No murmur heard. Pulmonary/Chest: Effort normal and breath sounds normal. No stridor. No respiratory distress. She has no wheezes. She has no rales. She exhibits no tenderness.  Abdominal: Soft. Bowel sounds are normal. She exhibits no distension and no mass. There is no tenderness. There is no rebound and no guarding.  Musculoskeletal: Normal range of motion. She exhibits no tenderness.  Lymphadenopathy:    She has no cervical adenopathy.  Neurological: She is oriented to person, place, and time.  Skin: Skin is warm and dry. No rash noted. She is not diaphoretic. No erythema. No pallor.  Psychiatric: She has a normal mood and affect. Her behavior is normal. Judgment and thought content normal.          Assessment & Plan:

## 2012-12-14 ENCOUNTER — Other Ambulatory Visit: Payer: Self-pay | Admitting: Obstetrics and Gynecology

## 2012-12-22 ENCOUNTER — Telehealth: Payer: Self-pay | Admitting: *Deleted

## 2012-12-22 NOTE — Telephone Encounter (Signed)
Left msg on triage stating pharmacy sent request on Monday md still haven;t response back. Requesting refill today. Pt then call back left another msg stating pls dis-reguard first msg pharmacy made an mistake will be able to pick med up today...Raechel Chute

## 2012-12-29 ENCOUNTER — Ambulatory Visit
Admission: RE | Admit: 2012-12-29 | Discharge: 2012-12-29 | Disposition: A | Discharge status: Home / Self Care | Payer: 59 | Source: Ambulatory Visit | Attending: Obstetrics and Gynecology | Admitting: Obstetrics and Gynecology

## 2012-12-29 DIAGNOSIS — R928 Other abnormal and inconclusive findings on diagnostic imaging of breast: Secondary | ICD-10-CM

## 2013-01-10 ENCOUNTER — Encounter: Payer: Self-pay | Admitting: Internal Medicine

## 2013-01-10 ENCOUNTER — Ambulatory Visit (INDEPENDENT_AMBULATORY_CARE_PROVIDER_SITE_OTHER): Payer: 59 | Admitting: Internal Medicine

## 2013-01-10 VITALS — BP 102/60 | HR 86 | Temp 98.7°F | Resp 16 | Wt 251.0 lb

## 2013-01-10 DIAGNOSIS — M545 Low back pain: Secondary | ICD-10-CM

## 2013-01-10 DIAGNOSIS — G8929 Other chronic pain: Secondary | ICD-10-CM

## 2013-01-10 DIAGNOSIS — M1990 Unspecified osteoarthritis, unspecified site: Secondary | ICD-10-CM

## 2013-01-10 DIAGNOSIS — N318 Other neuromuscular dysfunction of bladder: Secondary | ICD-10-CM

## 2013-01-10 DIAGNOSIS — N3281 Overactive bladder: Secondary | ICD-10-CM

## 2013-01-10 MED ORDER — OXYCODONE HCL 20 MG PO TB12: 20.0000 mg | ORAL_TABLET | Freq: Two times a day (BID) | ORAL | Status: DC

## 2013-01-10 MED ORDER — MIRABEGRON ER 50 MG PO TB24: 50.0000 mg | ORAL_TABLET | Freq: Every day | ORAL | Status: DC

## 2013-01-10 NOTE — Assessment & Plan Note (Signed)
She will continue the current meds for pain 

## 2013-01-10 NOTE — Assessment & Plan Note (Signed)
Urology referral for worsening symptoms

## 2013-01-10 NOTE — Assessment & Plan Note (Signed)
Continue oxycontin for pain 

## 2013-01-10 NOTE — Assessment & Plan Note (Signed)
Continue oxycontin for pain

## 2013-01-10 NOTE — Patient Instructions (Signed)

## 2013-01-10 NOTE — Progress Notes (Signed)
Subjective:    Patient ID: Joyce Payne, female    DOB: July 10, 1961, 52 y.o.   MRN: [id number]  Arthritis Presents for follow-up visit. She complains of pain. She reports no joint swelling or joint warmth. The symptoms have been improving. Affected locations include the left knee and right knee. Her pain is at a severity of 4/10. Associated symptoms include fatigue, pain at night and pain while resting. Pertinent negatives include no diarrhea, dry eyes, dry mouth, dysuria, fever, rash, Raynaud's syndrome, uveitis or weight loss. Compliance with total regimen is 76-100%. Compliance with medications is 76-100%.      Review of Systems  Constitutional: Positive for fatigue. Negative for fever, chills, weight loss, diaphoresis, activity change and appetite change.  HENT: Negative.   Eyes: Negative.   Respiratory: Negative.  Negative for cough, chest tightness, shortness of breath, wheezing and stridor.   Cardiovascular: Negative.  Negative for chest pain, palpitations and leg swelling.  Gastrointestinal: Negative.  Negative for nausea, vomiting, abdominal pain, diarrhea and constipation.  Endocrine: Negative.   Genitourinary: Positive for urgency, frequency and difficulty urinating (some incontinence). Negative for dysuria, hematuria, flank pain, decreased urine volume, enuresis and dyspareunia.  Musculoskeletal: Positive for back pain, arthralgias and arthritis. Negative for myalgias, joint swelling and gait problem.  Skin: Negative.  Negative for color change, pallor, rash and wound.  Allergic/Immunologic: Negative.   Neurological: Negative.  Negative for dizziness, weakness, light-headedness, numbness and headaches.  Hematological: Negative.  Negative for adenopathy. Does not bruise/bleed easily.  Psychiatric/Behavioral: Negative.        Objective:   Physical Exam  Vitals reviewed. Constitutional: She is oriented to person, place, and time. She appears well-developed and well-nourished.  No distress.  HENT:  Head: Normocephalic and atraumatic.  Mouth/Throat: Oropharynx is clear and moist. No oropharyngeal exudate.  Eyes: Conjunctivae are normal. Right eye exhibits no discharge. Left eye exhibits no discharge. No scleral icterus.  Neck: Normal range of motion. Neck supple. No JVD present. No tracheal deviation present. No thyromegaly present.  Cardiovascular: Normal rate, regular rhythm, normal heart sounds and intact distal pulses.  Exam reveals no gallop and no friction rub.   No murmur heard. Pulmonary/Chest: Effort normal and breath sounds normal. No stridor. No respiratory distress. She has no wheezes. She has no rales. She exhibits no tenderness.  Abdominal: Soft. Bowel sounds are normal. She exhibits no distension and no mass. There is no tenderness. There is no rebound and no guarding.  Musculoskeletal: Normal range of motion. She exhibits no edema and no tenderness.       Right knee: She exhibits deformity (mild crepitance). She exhibits normal range of motion, no swelling, no effusion, no ecchymosis, no laceration, no erythema, normal alignment, no LCL laxity, normal patellar mobility and no bony tenderness. No tenderness found.       Left knee: She exhibits deformity (mild crepitance). She exhibits normal range of motion, no swelling, no effusion, no ecchymosis, no laceration, no erythema, normal alignment, no LCL laxity, normal patellar mobility and no bony tenderness. No tenderness found.  Lymphadenopathy:    She has no cervical adenopathy.  Neurological: She is oriented to person, place, and time.  Skin: Skin is warm and dry. No rash noted. She is not diaphoretic. No erythema. No pallor.  Psychiatric: She has a normal mood and affect. Her behavior is normal. Judgment and thought content normal.      Lab Results  Component Value Date   WBC 9.0 07/26/2012   HGB 12.3 07/26/2012  HCT 36.7 07/26/2012   PLT 231.0 07/26/2012   GLUCOSE 93 07/26/2012   CHOL 155  07/26/2012   TRIG 96.0 07/26/2012   HDL 60.30 07/26/2012   LDLCALC 76 07/26/2012   ALT 21 07/26/2012   AST 19 07/26/2012   NA 143 07/26/2012   K 4.2 07/26/2012   CL 110 07/26/2012   CREATININE 0.8 07/26/2012   BUN 16 07/26/2012   CO2 26 07/26/2012   TSH 4.00 07/26/2012   INR 2.2 03/13/2011      Assessment & Plan:

## 2013-01-17 ENCOUNTER — Encounter: Payer: Self-pay | Admitting: Internal Medicine

## 2013-01-17 ENCOUNTER — Ambulatory Visit (INDEPENDENT_AMBULATORY_CARE_PROVIDER_SITE_OTHER): Payer: 59 | Admitting: Internal Medicine

## 2013-01-17 VITALS — BP 104/60 | HR 76 | Temp 98.2°F | Ht 67.5 in | Wt 249.0 lb

## 2013-01-17 DIAGNOSIS — J019 Acute sinusitis, unspecified: Secondary | ICD-10-CM

## 2013-01-17 MED ORDER — HYDROCODONE-HOMATROPINE 5-1.5 MG/5ML PO SYRP: 5.0000 mL | ORAL_SOLUTION | Freq: Three times a day (TID) | ORAL | Status: DC | PRN

## 2013-01-17 MED ORDER — LEVOFLOXACIN 250 MG PO TABS: 250.0000 mg | ORAL_TABLET | Freq: Every day | ORAL | Status: DC

## 2013-01-17 NOTE — Patient Instructions (Signed)

## 2013-01-17 NOTE — Progress Notes (Signed)
HPI  Pt presents to the clinic today with 1 week history of nasal congestion, dry cough, sore throat, ear pain and fever. She has been taking Mucinex and tylenol without only mild relief. She does not have a history of allergies or asthma. She does not have sick contacts that she knows of. She has never had sinus issues in the past.  Review of Systems    Past Medical History  Diagnosis Date  . Anxiety   . Depression   . GERD (gastroesophageal reflux disease)   . History of DVT (deep vein thrombosis) NOV 2011- RIGHT LOWER EXTREMITY  . History of pulmonary embolism NOV 2011- BILATERAL PE'S    COUMDADIN THERAPY ENDED JUNE 2012  . 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 Impingement syndrome of left shoulder   . Arthritis of knee   . Arthritis of lumbar spine   . S/P gastric bypass   . Sinus infection   . 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 H/O gastric bypass 02/15/2012    6/88  . Hx of pulmonary embolus 02/15/2012    Occurred during chemo for Hodgkin's dis 2011  . Depression with anxiety 02/16/2012  . Recurrent binge eating 02/16/2012    Reason for topomax    Family History  Problem Relation Age of Onset  . Hypertension Other   . Cancer Other     colon, 1st degree relative<60  . Hypertension Mother   . Hyperlipidemia Mother   . Cancer Father     History   Social History  . Marital Status: Single    Spouse Name: N/A    Number of Children: N/A  . Years of Education: N/A   Occupational History  . Insurance account manager [employer]   Social History Main Topics  . Smoking status: Former Smoker -- 10 years    Types: Cigarettes    Quit date: 10/22/2006  . Smokeless tobacco: Never Used  . Alcohol Use: No  . Drug Use: No  . Sexually Active: Not Currently   Other Topics Concern  . Not on file   Social History  Narrative   Regular exercise- NO    Allergies  Allergen Reactions  . Povidone Rash     Constitutional: Positive headache, fatigue and fever. Denies abrupt weight changes.  HEENT:  Positive eye pain, pressure behind the eyes, facial pain, nasal congestion and sore throat. Denies eye redness, ear pain, ringing in the ears, wax buildup, runny nose or bloody nose. Respiratory: Positive cough. Denies difficulty breathing or shortness of breath.  Cardiovascular: Denies chest pain, chest tightness, palpitations or swelling in the hands or feet.   No other specific complaints in a complete review of systems (except as listed in HPI above).  Objective:    BP 104/60  Pulse 76  Temp(Src) 98.2 F (36.8 C) (Oral)  Ht 5' 7.5" (1.715 m)  Wt 249 lb (112.946 kg)  BMI 38.4 kg/m2  SpO2 98%  LMP 10/25/2012 Wt Readings from Last 3 Encounters:  01/17/13 249 lb (112.946 kg)  01/10/13 251 lb (113.853 kg)  11/04/12 253 lb (114.76 kg)    General: Appears her stated age, well developed, well nourished in NAD. HEENT: Head: normal shape and size; Eyes: sclera white, no icterus, conjunctiva pink, PERRLA and EOMs intact; Ears: Tm's gray and intact,  normal light reflex; Nose: mucosa pink and moist, septum midline; Throat/Mouth: + PND. Teeth present, mucosa pink and moist, no exudate noted, no lesions or ulcerations noted.  Neck: Mild cervical lymphadenopathy. Neck supple, trachea midline. No massses, lumps or thyromegaly present.  Cardiovascular: Normal rate and rhythm. S1,S2 noted.  No murmur, rubs or gallops noted. No JVD or BLE edema. No carotid bruits noted. Pulmonary/Chest: Normal effort and positive vesicular breath sounds. No respiratory distress. No wheezes, rales or ronchi noted.      Assessment & Plan:   Acute bacterial sinusitis  Can use a Neti Pot which can be purchased from your local drug store. Flonase 2 sprays each nostril for 3 days and then as needed. Levaquin daily for 10  days Hycodan cough syrup as needed  RTC as needed or if symptoms persist.

## 2013-02-06 ENCOUNTER — Encounter (HOSPITAL_COMMUNITY): Payer: Self-pay

## 2013-02-06 ENCOUNTER — Ambulatory Visit (HOSPITAL_COMMUNITY)
Admission: RE | Admit: 2013-02-06 | Discharge: 2013-02-06 | Disposition: A | Discharge status: Home / Self Care | Payer: 59 | Source: Ambulatory Visit | Attending: Oncology | Admitting: Oncology

## 2013-02-06 ENCOUNTER — Other Ambulatory Visit (HOSPITAL_BASED_OUTPATIENT_CLINIC_OR_DEPARTMENT_OTHER): Payer: 59 | Admitting: Lab

## 2013-02-06 DIAGNOSIS — M47812 Spondylosis without myelopathy or radiculopathy, cervical region: Secondary | ICD-10-CM | POA: Insufficient documentation

## 2013-02-06 DIAGNOSIS — C8119 Nodular sclerosis classical Hodgkin lymphoma, extranodal and solid organ sites: Secondary | ICD-10-CM

## 2013-02-06 DIAGNOSIS — C8190 Hodgkin lymphoma, unspecified, unspecified site: Secondary | ICD-10-CM | POA: Insufficient documentation

## 2013-02-06 DIAGNOSIS — Z9221 Personal history of antineoplastic chemotherapy: Secondary | ICD-10-CM | POA: Insufficient documentation

## 2013-02-06 LAB — CBC WITH DIFFERENTIAL/PLATELET
BASO%: 0.3 % (ref 0.0–2.0)
Basophils Absolute: 0 10*3/uL (ref 0.0–0.1)
EOS%: 0.4 % (ref 0.0–7.0)
HCT: 41.3 % (ref 34.8–46.6)
HGB: 13.8 g/dL (ref 11.6–15.9)
LYMPH%: 22.8 % (ref 14.0–49.7)
MCH: 30.5 pg (ref 25.1–34.0)
MCHC: 33.5 g/dL (ref 31.5–36.0)
MONO#: 0.5 10*3/uL (ref 0.1–0.9)
NEUT%: 71 % (ref 38.4–76.8)
Platelets: 251 10*3/uL (ref 145–400)

## 2013-02-06 LAB — COMPREHENSIVE METABOLIC PANEL (CC13)
ALT: 18 U/L (ref 0–55)
AST: 15 U/L (ref 5–34)
Albumin: 3.7 g/dL (ref 3.5–5.0)
Alkaline Phosphatase: 80 U/L (ref 40–150)
Glucose: 108 mg/dl — ABNORMAL HIGH (ref 70–99)
Potassium: 4 mEq/L (ref 3.5–5.1)
Sodium: 142 mEq/L (ref 136–145)
Total Bilirubin: 0.35 mg/dL (ref 0.20–1.20)
Total Protein: 6.3 g/dL — ABNORMAL LOW (ref 6.4–8.3)

## 2013-02-06 LAB — LACTATE DEHYDROGENASE (CC13): LDH: 167 U/L (ref 125–245)

## 2013-02-06 LAB — SEDIMENTATION RATE: Sed Rate: 1 mm/hr (ref 0–22)

## 2013-02-06 MED ORDER — IOHEXOL 300 MG/ML  SOLN
125.0000 mL | Freq: Once | INTRAMUSCULAR | Status: AC | PRN
  Administered 2013-02-06: 125 mL via INTRAVENOUS

## 2013-02-07 ENCOUNTER — Other Ambulatory Visit: Payer: 59 | Admitting: Lab

## 2013-02-08 ENCOUNTER — Telehealth: Payer: Self-pay | Admitting: *Deleted

## 2013-02-08 NOTE — Telephone Encounter (Signed)
Message copied by Sabino Snipes on Wed Feb 08, 2013  1:24 PM ------      Message from: Levert Feinstein      Created: Tue Feb 07, 2013  6:24 PM       Call pt CT negative for recurrent Hodgkin's ------

## 2013-02-08 NOTE — Telephone Encounter (Signed)
Talked with pt & informed that CT negative for recurrent Hodgkins & she expressed appreciation.  She knows she has an appt on Monday.

## 2013-02-13 ENCOUNTER — Telehealth: Payer: Self-pay | Admitting: Oncology

## 2013-02-13 ENCOUNTER — Ambulatory Visit (HOSPITAL_BASED_OUTPATIENT_CLINIC_OR_DEPARTMENT_OTHER): Payer: 59 | Admitting: Oncology

## 2013-02-13 VITALS — BP 137/82 | HR 80 | Temp 98.4°F | Resp 18 | Ht 67.0 in | Wt 247.6 lb

## 2013-02-13 DIAGNOSIS — F341 Dysthymic disorder: Secondary | ICD-10-CM

## 2013-02-13 DIAGNOSIS — C8119 Nodular sclerosis classical Hodgkin lymphoma, extranodal and solid organ sites: Secondary | ICD-10-CM

## 2013-02-13 DIAGNOSIS — C8110 Nodular sclerosis classical Hodgkin lymphoma, unspecified site: Secondary | ICD-10-CM

## 2013-02-13 DIAGNOSIS — G43909 Migraine, unspecified, not intractable, without status migrainosus: Secondary | ICD-10-CM

## 2013-02-19 NOTE — Progress Notes (Signed)
Hematology and Oncology Follow Up Visit  Joyce Payne [id number] August 07, 1961 52 y.o. 02/19/2013 12:50 PM   Principle Diagnosis: Encounter Diagnosis  Name Primary?  . Hodgkin's lymphoma, nodular sclerosis Yes     Interim History:    Followup visit for this 52 year old woman with stage IIA Hodgkin's lymphoma in remission.  She presented in mid-June of 2011 with a palpable lymph node mass in the right neck and supraclavicular area. She was evaluated by Dr. Mechele Claude at the [facility] in [place]. CT scan showed additional adenopathy in the right paratracheal region. Apparently, PET scanning also showed an abnormal activity in the left hip. An initial staging bone marrow biopsy was negative for lymphoma. It is really not clear whether or not the abnormality in the left hip was really a focus of involvement with Hodgkin's disease. I personally tend to doubt this. She was treated with 6 cycles of ABVD chemotherapy. Due to decline in her oxygen exchange, the bleomycin had to be deleted from the last 2 cycles.  She developed an extensive cellulitis of her right calf and thigh in November of 2011. Although lower extremity Dopplers were negative for clot, it was presumed that this was where the clot started. She was admitted to the hospital. Scanning incidentally showed pulmonary emboli at that time (08/25/10). She was anticoagulated for 1 year.  She completed all planned chemotherapy approximately 09/30/10. Interim scanning after 4 cycles showed a complete response. Subsequent studies done at time she moved to [facility] in June 2012 showed ongoing complete remission. Scans done in anticipation of today's visit on 02/06/2013, which I have personally reviewed, show no evidence for new disease. An asymptomatic lucent lesion in the anterior left femoral head is stable.  She has had no interim medical problems. No infections.    Medications: reviewed  Allergies:  Allergies  Allergen  Reactions  . Povidone Rash    Review of Systems: Constitutional:  No constitutional symptoms  Respiratory: Recent prolonged bronchitis which has now resolved Cardiovascular:  No chest pain or palpitations Gastrointestinal: No abdominal pain or change in bowel habit Genito-Urinary: No urinary tract symptoms. Menses ceased following chemotherapy treatments Musculoskeletal: No muscle bone or joint pain Neurologic: No headache or change in vision Skin: No rash or ecchymosis Remaining ROS negative.  Physical Exam: Blood pressure 137/82, pulse 80, temperature 98.4 F (36.9 C), temperature source Oral, resp. rate 18, height 5\' 7"  (1.702 m), weight 247 lb 9.6 oz (112.311 kg). Wt Readings from Last 3 Encounters:  02/13/13 247 lb 9.6 oz (112.311 kg)  01/17/13 249 lb (112.946 kg)  01/10/13 251 lb (113.853 kg)     General appearance: Well-nourished Caucasian woman HENNT: Pharynx no erythema or exudate Lymph nodes: No cervical, supraclavicular, or axillary adenopathy Breasts: Lungs: Clear to auscultation resonant to percussion Heart: Regular rhythm no murmur Abdomen: Soft, nontender, no mass, no organomegaly Extremities: No edema, no calf tenderness Musculoskeletal: No joint deformities GU: Vascular: No cyanosis Neurologic: Motor strength 5 over 5, reflexes 1+ symmetric, sensation intact to vibration over the fingertips Skin: No rash or ecchymosis  Lab Results: Lab Results  Component Value Date   WBC 8.4 02/06/2013   HGB 13.8 02/06/2013   HCT 41.3 02/06/2013   MCV 91.1 02/06/2013   PLT 251 02/06/2013     Chemistry      Component Value Date/Time   NA 142 02/06/2013 1022   NA 143 07/26/2012 1032   NA 141 02/04/2012 0944   K 4.0 02/06/2013 1022   K  4.2 07/26/2012 1032   K 4.3 02/04/2012 0944   CL 108* 02/06/2013 1022   CL 110 07/26/2012 1032   CL 100 02/04/2012 0944   CO2 24 02/06/2013 1022   CO2 26 07/26/2012 1032   CO2 28 02/04/2012 0944   BUN 18.3 02/06/2013 1022   BUN 16  07/26/2012 1032   BUN 24* 02/04/2012 0944   CREATININE 0.9 02/06/2013 1022   CREATININE 0.8 07/26/2012 1032   CREATININE 0.9 02/04/2012 0944      Component Value Date/Time   CALCIUM 9.4 02/06/2013 1022   CALCIUM 8.6 07/26/2012 1032   CALCIUM 8.6 02/04/2012 0944   ALKPHOS 80 02/06/2013 1022   ALKPHOS 66 07/26/2012 1032   ALKPHOS 78 02/04/2012 0944   AST 15 02/06/2013 1022   AST 19 07/26/2012 1032   AST 27 02/04/2012 0944   ALT 18 02/06/2013 1022   ALT 21 07/26/2012 1032   BILITOT 0.35 02/06/2013 1022   BILITOT 0.4 07/26/2012 1032   BILITOT 0.70 02/04/2012 0944       Radiological Studies: Ct Soft Tissue Neck W Contrast  02/06/2013  *RADIOLOGY REPORT*  Clinical Data: Follow-up Hodgkin's disease in remission.  CT NECK WITH CONTRAST  Technique:  Multidetector CT imaging of the neck was performed with intravenous contrast.  Contrast: OMNIPAQUE IOHEXOL 300 MG/ML  SOLN  Comparison:  08/03/2011.  Findings: Suprahyoid neck:  The major and minor salivary glands are unremarkable.  No parapharyngeal edema or masses.  No vascular abnormality.  No retropharyngeal soft tissue swelling.  Orbits and nasal cavity free of disease.  Larynx:  Normal vocal cords.  Subglottic and supraglottic soft tissues within normal limits.  Infrahyoid neck:  Normal thyroid gland.  Cervical musculature symmetric.  Lymph nodes:  No pathologic lymphadenopathy.  Upper chest/mediastinum:  See chest CT report, dictated separately.  Additional:  Mild spondylosis C6-C7.  Normal visualized intracranial compartment.  No paranasal sinus disease.  No extracranial flow reducing lesion.  IMPRESSION: Negative.  No evidence for Hodgkin's disease recurrence in the neck.   Original Report Authenticated By: Davonna Belling, M.D.    Ct Chest W Contrast  02/06/2013  *RADIOLOGY REPORT*  Clinical Data:  Hodgkin's lymphoma  CT CHEST, ABDOMEN AND PELVIS WITH CONTRAST  Technique:  Multidetector CT imaging of the chest, abdomen and pelvis was performed  following the standard protocol during bolus administration of intravenous contrast.  Contrast: OMNIPAQUE IOHEXOL 300 MG/ML  SOLN  Comparison:  02/04/2012  CT CHEST  Findings:  There is no axillary, mediastinal, or hilar lymphadenopathy.  Heart size is normal.  No pericardial or pleural effusion.  Lung windows show no parenchymal nodular mass.  No focal airspace consolidation.  There is some dependent atelectasis in the lower lobes bilaterally.  Bone windows reveal no worrisome lytic or sclerotic osseous lesions.  IMPRESSION: Previously seen patchy lung opacities have resolved in the interval.  No acute or new interval findings on today's exam.  CT ABDOMEN AND PELVIS  Findings:  Tiny low density lesion in the left liver is too small to characterize.  Although this is not well seen on the most recent comparison study, and is well visualized on the study from 08/03/2011 and is unchanged in the interval no focal abnormalities seen in the spleen.  Suture line noted in the proximal stomach. Duodenum, pancreas, gallbladder, and adrenal glands are unremarkable.  No abdominal aortic aneurysm.  No free fluid or lymphadenopathy in the abdomen.  Visualized portions of the abdominal bowel loops are unremarkable.  Imaging through the pelvis shows no free intraperitoneal fluid.  No pelvic sidewall lymphadenopathy.  Bladder and uterus are unremarkable.  No adnexal mass.  No substantial diverticular change in the left colon.  No colonic diverticulitis.  The terminal ileum and the appendix are normal.  Lucent lesion in the anterior left femoral head is stable.  No other worrisome lytic or sclerotic osseous abnormality is evident.  IMPRESSION:  Stable CT scan without evidence for new or progressive disease to suggest recurrent lymphoma.  Stable lucent lesion in the anterior left femoral head.   Original Report Authenticated By: Kennith Center, M.D.    Ct Abdomen Pelvis W Contrast  02/06/2013  *RADIOLOGY REPORT*  Clinical Data:   Hodgkin's lymphoma  CT CHEST, ABDOMEN AND PELVIS WITH CONTRAST  Technique:  Multidetector CT imaging of the chest, abdomen and pelvis was performed following the standard protocol during bolus administration of intravenous contrast.  Contrast: OMNIPAQUE IOHEXOL 300 MG/ML  SOLN  Comparison:  02/04/2012  CT CHEST  Findings:  There is no axillary, mediastinal, or hilar lymphadenopathy.  Heart size is normal.  No pericardial or pleural effusion.  Lung windows show no parenchymal nodular mass.  No focal airspace consolidation.  There is some dependent atelectasis in the lower lobes bilaterally.  Bone windows reveal no worrisome lytic or sclerotic osseous lesions.  IMPRESSION: Previously seen patchy lung opacities have resolved in the interval.  No acute or new interval findings on today's exam.  CT ABDOMEN AND PELVIS  Findings:  Tiny low density lesion in the left liver is too small to characterize.  Although this is not well seen on the most recent comparison study, and is well visualized on the study from 08/03/2011 and is unchanged in the interval no focal abnormalities seen in the spleen.  Suture line noted in the proximal stomach. Duodenum, pancreas, gallbladder, and adrenal glands are unremarkable.  No abdominal aortic aneurysm.  No free fluid or lymphadenopathy in the abdomen.  Visualized portions of the abdominal bowel loops are unremarkable.  Imaging through the pelvis shows no free intraperitoneal fluid.  No pelvic sidewall lymphadenopathy.  Bladder and uterus are unremarkable.  No adnexal mass.  No substantial diverticular change in the left colon.  No colonic diverticulitis.  The terminal ileum and the appendix are normal.  Lucent lesion in the anterior left femoral head is stable.  No other worrisome lytic or sclerotic osseous abnormality is evident.  IMPRESSION:  Stable CT scan without evidence for new or progressive disease to suggest recurrent lymphoma.  Stable lucent lesion in the anterior left  femoral head.   Original Report Authenticated By: Kennith Center, M.D.     Impression: #1. Stage IIA  nodular sclerosing Hodgkin's lymphoma treated as outlined above. She remains free of any recurrence now out almost 3 years from initial diagnosis. She has a high chance of being cured at this point. I will discontinue routine CT scans but I will get an annual chest radiograph to look for second malignancies related to her chemotherapy treatments.  #2. Nonspecific stable lesion left femoral head.  #3. History of gastric bypass surgery to control obesity.  #4. Migraine headache.  #5. Chronic anxiety and depression  #6. History of right calf cellulitis and presumed DVT with associated asymptomatic pulmonary emboli diagnosed November 2012 while on chemotherapy. Anticoagulation for one year. Currently stable off anticoagulants.  #7. Degenerative arthritis. Status post previous lumbar laminectomy  #8. Status post left shoulder surgery for a torn ligament August 2013.  CC:.    Levert Feinstein, MD 5/11/201412:50 PM

## 2013-03-29 ENCOUNTER — Other Ambulatory Visit: Payer: Self-pay | Admitting: Internal Medicine

## 2013-04-11 ENCOUNTER — Ambulatory Visit (INDEPENDENT_AMBULATORY_CARE_PROVIDER_SITE_OTHER): Payer: 59 | Admitting: Internal Medicine

## 2013-04-11 ENCOUNTER — Encounter: Payer: Self-pay | Admitting: Internal Medicine

## 2013-04-11 VITALS — BP 108/72 | HR 77 | Temp 98.8°F | Resp 16 | Wt 247.0 lb

## 2013-04-11 DIAGNOSIS — M1990 Unspecified osteoarthritis, unspecified site: Secondary | ICD-10-CM

## 2013-04-11 DIAGNOSIS — M545 Low back pain: Secondary | ICD-10-CM

## 2013-04-11 MED ORDER — OXYCODONE HCL 10 MG PO TABS: 10.0000 mg | ORAL_TABLET | Freq: Three times a day (TID) | ORAL | Status: DC | PRN

## 2013-04-11 NOTE — Assessment & Plan Note (Signed)
She is improving and wants to start a taper of the oxycodone dose so I have changed her from the ER to the IR dose

## 2013-04-11 NOTE — Patient Instructions (Signed)
Back Pain, Adult  Low back pain is very common. About 1 in 5 people have back pain. The cause of low back pain is rarely dangerous. The pain often gets better over time. About half of people with a sudden onset of back pain feel better in just 2 weeks. About 8 in 10 people feel better by 6 weeks.   CAUSES  Some common causes of back pain include:  · Strain of the muscles or ligaments supporting the spine.  · Wear and tear (degeneration) of the spinal discs.  · Arthritis.  · Direct injury to the back.  DIAGNOSIS  Most of the time, the direct cause of low back pain is not known. However, back pain can be treated effectively even when the exact cause of the pain is unknown. Answering your caregiver's questions about your overall health and symptoms is one of the most accurate ways to make sure the cause of your pain is not dangerous. If your caregiver needs more information, he or she may order lab work or imaging tests (X-rays or MRIs). However, even if imaging tests show changes in your back, this usually does not require surgery.  HOME CARE INSTRUCTIONS  For many people, back pain returns. Since low back pain is rarely dangerous, it is often a condition that people can learn to manage on their own.   · Remain active. It is stressful on the back to sit or stand in one place. Do not sit, drive, or stand in one place for more than 30 minutes at a time. Take short walks on level surfaces as soon as pain allows. Try to increase the length of time you walk each day.  · Do not stay in bed. Resting more than 1 or 2 days can delay your recovery.  · Do not avoid exercise or work. Your body is made to move. It is not dangerous to be active, even though your back may hurt. Your back will likely heal faster if you return to being active before your pain is gone.  · Pay attention to your body when you  bend and lift. Many people have less discomfort when lifting if they bend their knees, keep the load close to their bodies, and  avoid twisting. Often, the most comfortable positions are those that put less stress on your recovering back.  · Find a comfortable position to sleep. Use a firm mattress and lie on your side with your knees slightly bent. If you lie on your back, put a pillow under your knees.  · Only take over-the-counter or prescription medicines as directed by your caregiver. Over-the-counter medicines to reduce pain and inflammation are often the most helpful. Your caregiver may prescribe muscle relaxant drugs. These medicines help dull your pain so you can more quickly return to your normal activities and healthy exercise.  · Put ice on the injured area.  · Put ice in a plastic bag.  · Place a towel between your skin and the bag.  · Leave the ice on for 15-20 minutes, 3-4 times a day for the first 2 to 3 days. After that, ice and heat may be alternated to reduce pain and spasms.  · Ask your caregiver about trying back exercises and gentle massage. This may be of some benefit.  · Avoid feeling anxious or stressed. Stress increases muscle tension and can worsen back pain. It is important to recognize when you are anxious or stressed and learn ways to manage it. Exercise is a great option.  SEEK MEDICAL CARE IF:  · You have pain that is not relieved with rest or   medicine.  · You have pain that does not improve in 1 week.  · You have new symptoms.  · You are generally not feeling well.  SEEK IMMEDIATE MEDICAL CARE IF:   · You have pain that radiates from your back into your legs.  · You develop new bowel or bladder control problems.  · You have unusual weakness or numbness in your arms or legs.  · You develop nausea or vomiting.  · You develop abdominal pain.  · You feel faint.  Document Released: 09/28/2005 Document Revised: 03/29/2012 Document Reviewed: 02/16/2011  [employer]® Patient Information ©2014 [employer].

## 2013-04-11 NOTE — Progress Notes (Signed)
Subjective:    Patient ID: Joyce Payne, female    DOB: October 19, 1960, 52 y.o.   MRN: [id number]  Arthritis Presents for follow-up visit. She complains of pain. She reports no stiffness, joint swelling or joint warmth. The symptoms have been improving. Affected locations include the right knee and left hip. Her pain is at a severity of 3/10. Associated symptoms include pain at night and pain while resting. Pertinent negatives include no diarrhea, dry eyes, dry mouth, dysuria, fatigue, fever, rash, Raynaud's syndrome, uveitis or weight loss. Compliance with total regimen is 76-100%. Compliance with medications is 76-100%.      Review of Systems  Constitutional: Negative.  Negative for fever, chills, weight loss, diaphoresis and fatigue.  HENT: Negative.   Eyes: Negative.   Respiratory: Negative.  Negative for cough, chest tightness, shortness of breath, wheezing and stridor.   Cardiovascular: Negative.  Negative for chest pain, palpitations and leg swelling.  Gastrointestinal: Negative.  Negative for nausea, vomiting, abdominal pain, diarrhea, constipation and blood in stool.  Endocrine: Negative.   Genitourinary: Negative.  Negative for dysuria.  Musculoskeletal: Positive for back pain, arthralgias and arthritis. Negative for myalgias, joint swelling, gait problem and stiffness.  Skin: Negative.  Negative for color change, pallor, rash and wound.  Allergic/Immunologic: Negative.   Neurological: Negative.  Negative for dizziness, weakness, light-headedness and numbness.  Hematological: Negative.  Negative for adenopathy. Does not bruise/bleed easily.  Psychiatric/Behavioral: Negative.        Objective:   Physical Exam  Vitals reviewed. Constitutional: She is oriented to person, place, and time. She appears well-developed and well-nourished. No distress.  HENT:  Head: Normocephalic and atraumatic.  Mouth/Throat: Oropharynx is clear and moist. No oropharyngeal exudate.  Eyes:  Conjunctivae are normal. Right eye exhibits no discharge. Left eye exhibits no discharge. No scleral icterus.  Neck: Normal range of motion. Neck supple. No JVD present. No tracheal deviation present. No thyromegaly present.  Cardiovascular: Normal rate, regular rhythm, normal heart sounds and intact distal pulses.  Exam reveals no gallop and no friction rub.   No murmur heard. Pulmonary/Chest: Effort normal and breath sounds normal. No stridor. No respiratory distress. She has no wheezes. She has no rales. She exhibits no tenderness.  Abdominal: Soft. Bowel sounds are normal. She exhibits no distension and no mass. There is no tenderness. There is no rebound and no guarding.  Musculoskeletal: Normal range of motion. She exhibits no edema and no tenderness.       Left hip: Normal. She exhibits normal range of motion, normal strength, no tenderness, no bony tenderness, no swelling, no crepitus, no deformity and no laceration.       Right knee: Normal.       Left knee: Normal.       Lumbar back: Normal.  Lymphadenopathy:    She has no cervical adenopathy.  Neurological: She is alert and oriented to person, place, and time. She has normal strength. She displays no atrophy, no tremor and normal reflexes. No cranial nerve deficit or sensory deficit. She exhibits normal muscle tone. She displays a negative Romberg sign. She displays no seizure activity. Coordination and gait normal.  Reflex Scores:      Tricep reflexes are 1+ on the right side and 1+ on the left side.      Bicep reflexes are 1+ on the right side and 1+ on the left side.      Brachioradialis reflexes are 1+ on the right side and 1+ on the left side.  Patellar reflexes are 1+ on the right side and 1+ on the left side.      Achilles reflexes are 1+ on the right side and 1+ on the left side. Neg SLR in BLE  Skin: Skin is warm and dry. No rash noted. She is not diaphoretic. No erythema. No pallor.  Psychiatric: She has a normal mood  and affect. Her behavior is normal. Judgment and thought content normal.      Lab Results  Component Value Date   WBC 8.4 02/06/2013   HGB 13.8 02/06/2013   HCT 41.3 02/06/2013   PLT 251 02/06/2013   GLUCOSE 108* 02/06/2013   CHOL 155 07/26/2012   TRIG 96.0 07/26/2012   HDL 60.30 07/26/2012   LDLCALC 76 07/26/2012   ALT 18 02/06/2013   AST 15 02/06/2013   NA 142 02/06/2013   K 4.0 02/06/2013   CL 108* 02/06/2013   CREATININE 0.9 02/06/2013   BUN 18.3 02/06/2013   CO2 24 02/06/2013   TSH 4.00 07/26/2012   INR 2.2 03/13/2011      Assessment & Plan:

## 2013-04-11 NOTE — Assessment & Plan Note (Signed)
She wants to begin a taper of the oxycodone so I have changed her from the XR to the IR so that she can adjust the dose more easily

## 2013-04-19 ENCOUNTER — Other Ambulatory Visit: Payer: Self-pay | Admitting: Internal Medicine

## 2013-04-19 ENCOUNTER — Telehealth: Payer: Self-pay | Admitting: *Deleted

## 2013-04-19 DIAGNOSIS — M1990 Unspecified osteoarthritis, unspecified site: Secondary | ICD-10-CM

## 2013-04-19 DIAGNOSIS — M545 Low back pain: Secondary | ICD-10-CM

## 2013-04-19 MED ORDER — OXYCODONE HCL 10 MG PO TABS: 10.0000 mg | ORAL_TABLET | Freq: Four times a day (QID) | ORAL | Status: DC | PRN

## 2013-04-19 NOTE — Telephone Encounter (Signed)
Action complete

## 2013-04-19 NOTE — Telephone Encounter (Signed)
Pt called requesting to speak with Dr Yetta Barre in reference to a tooth extraction.  Pt states she had the tooth extracted on April 11, 2013 and states seh is still in pain.  She is specifically requesting an additional Rx for Oxycodone 10mg .  Please advise

## 2013-04-19 NOTE — Telephone Encounter (Signed)
done

## 2013-04-20 ENCOUNTER — Telehealth: Payer: Self-pay | Admitting: *Deleted

## 2013-04-20 DIAGNOSIS — M1990 Unspecified osteoarthritis, unspecified site: Secondary | ICD-10-CM

## 2013-04-20 DIAGNOSIS — M545 Low back pain: Secondary | ICD-10-CM

## 2013-04-20 MED ORDER — OXYCODONE HCL 10 MG PO TABS: 10.0000 mg | ORAL_TABLET | Freq: Four times a day (QID) | ORAL | Status: DC | PRN

## 2013-04-20 NOTE — Telephone Encounter (Signed)
Pt notified change made to Rx and may pick up Rx later today.

## 2013-04-20 NOTE — Telephone Encounter (Signed)
Change has been made.  

## 2013-04-20 NOTE — Telephone Encounter (Signed)
Pt called states her Oxycodone [facility] rx was written to be filled on or after 09.01.2014 pt states is should be written to be filled now.  Pt is also requesting the quantity be changed to #75. Please advise

## 2013-04-21 ENCOUNTER — Telehealth: Payer: Self-pay | Admitting: *Deleted

## 2013-04-21 NOTE — Telephone Encounter (Signed)
Spoke with pt made aware of MDs message.  Pt wants Dr Yetta Barre to call her at [phone].

## 2013-04-21 NOTE — Telephone Encounter (Signed)
Pt called requesting refill on Lorazepam 1mg  TID.  Pt states she has been unable to reach the prescribing MD to request refill would like to know whether Dr Yetta Barre will refill it.  Please advise pt.

## 2013-04-21 NOTE — Telephone Encounter (Signed)
Dr. Dub Mikes needs to make arrangements for this

## 2013-05-04 ENCOUNTER — Telehealth: Payer: Self-pay | Admitting: *Deleted

## 2013-05-04 NOTE — Telephone Encounter (Signed)
Is she also taking hydrocodone given to her by a dentist?

## 2013-05-04 NOTE — Telephone Encounter (Signed)
Pt called requesting a refill on Oxycodone 10mg  #40.  Pt requests early refill. Please advise

## 2013-05-05 ENCOUNTER — Other Ambulatory Visit: Payer: Self-pay | Admitting: Internal Medicine

## 2013-05-05 ENCOUNTER — Telehealth: Payer: Self-pay | Admitting: *Deleted

## 2013-05-05 DIAGNOSIS — M545 Low back pain: Secondary | ICD-10-CM

## 2013-05-05 DIAGNOSIS — M1990 Unspecified osteoarthritis, unspecified site: Secondary | ICD-10-CM

## 2013-05-05 MED ORDER — OXYCODONE HCL 10 MG PO TABS: 10.0000 mg | ORAL_TABLET | Freq: Four times a day (QID) | ORAL | Status: DC | PRN

## 2013-05-05 NOTE — Telephone Encounter (Signed)
Pt states she is no longer taking the Hydrocodone given by Dentist.

## 2013-05-05 NOTE — Telephone Encounter (Signed)
Pt states she is no longer taking Hydrocodone given by Dentist.

## 2013-05-10 ENCOUNTER — Ambulatory Visit: Payer: 59 | Admitting: Internal Medicine

## 2013-05-16 NOTE — Telephone Encounter (Signed)
A user error has taken place.

## 2013-07-31 ENCOUNTER — Encounter: Payer: Self-pay | Admitting: Internal Medicine

## 2013-07-31 ENCOUNTER — Ambulatory Visit (INDEPENDENT_AMBULATORY_CARE_PROVIDER_SITE_OTHER): Payer: 59 | Admitting: Internal Medicine

## 2013-07-31 VITALS — BP 108/70 | HR 64 | Temp 98.0°F | Resp 16 | Ht 67.0 in | Wt 245.0 lb

## 2013-07-31 DIAGNOSIS — J019 Acute sinusitis, unspecified: Secondary | ICD-10-CM

## 2013-07-31 DIAGNOSIS — G8929 Other chronic pain: Secondary | ICD-10-CM

## 2013-07-31 MED ORDER — AZITHROMYCIN 500 MG PO TABS: 500.0000 mg | ORAL_TABLET | Freq: Every day | ORAL | Status: DC

## 2013-07-31 NOTE — Assessment & Plan Note (Signed)
I will treat the infection with Zpak

## 2013-07-31 NOTE — Patient Instructions (Signed)

## 2013-07-31 NOTE — Progress Notes (Signed)
Subjective:    Patient ID: Joyce Payne, female    DOB: [birth year], 52 y.o.   MRN: [id number]  Sore Throat  This is a new problem. The current episode started 1 to 4 weeks ago. The problem has been unchanged. Neither side of throat is experiencing more pain than the other. There has been no fever. The fever has been present for less than 1 day. The pain is at a severity of 0/10. The patient is experiencing no pain. Associated symptoms include coughing (productive of yellow phlegm). Pertinent negatives include no abdominal pain, congestion, diarrhea, drooling, ear discharge, ear pain, headaches, hoarse voice, plugged ear sensation, neck pain, shortness of breath, stridor, swollen glands, trouble swallowing or vomiting. She has tried nothing for the symptoms. The treatment provided no relief.      Review of Systems  Constitutional: Negative.  Negative for fever, chills, diaphoresis, appetite change and fatigue.  HENT: Positive for sinus pressure and sore throat. Negative for congestion, drooling, ear discharge, ear pain, hoarse voice, nosebleeds, postnasal drip, rhinorrhea, sneezing, tinnitus and trouble swallowing.   Eyes: Negative.   Respiratory: Positive for cough (productive of yellow phlegm). Negative for apnea, choking, chest tightness, shortness of breath, wheezing and stridor.   Cardiovascular: Negative.  Negative for chest pain, palpitations and leg swelling.  Gastrointestinal: Negative.  Negative for vomiting, abdominal pain, diarrhea, constipation and blood in stool.  Endocrine: Negative.   Genitourinary: Negative.   Musculoskeletal: Negative.  Negative for arthralgias, back pain and neck pain.  Skin: Negative.  Negative for rash.  Allergic/Immunologic: Negative.   Neurological: Negative.  Negative for headaches.  Hematological: Negative.  Negative for adenopathy. Does not bruise/bleed easily.  Psychiatric/Behavioral: Negative.        Objective:   Physical Exam  Vitals  reviewed. Constitutional: She is oriented to person, place, and time. She appears well-developed and well-nourished.  Non-toxic appearance. She does not have a sickly appearance. She does not appear ill. No distress.  HENT:  Head: Normocephalic and atraumatic.  Right Ear: Hearing, tympanic membrane, external ear and ear canal normal.  Left Ear: Hearing, tympanic membrane, external ear and ear canal normal.  Nose: No mucosal edema or rhinorrhea. Right sinus exhibits no maxillary sinus tenderness and no frontal sinus tenderness. Left sinus exhibits no maxillary sinus tenderness and no frontal sinus tenderness.  Mouth/Throat: Oropharynx is clear and moist and mucous membranes are normal. Mucous membranes are not pale, not dry and not cyanotic. No oral lesions. No trismus in the jaw. No uvula swelling. No oropharyngeal exudate, posterior oropharyngeal edema, posterior oropharyngeal erythema or tonsillar abscesses.  Eyes: Conjunctivae are normal. Right eye exhibits no discharge. Left eye exhibits no discharge. No scleral icterus.  Neck: Normal range of motion. Neck supple. No JVD present. No tracheal deviation present. No thyromegaly present.  Cardiovascular: Normal rate, regular rhythm, normal heart sounds and intact distal pulses.  Exam reveals no gallop and no friction rub.   No murmur heard. Pulmonary/Chest: Effort normal and breath sounds normal. No stridor. No respiratory distress. She has no wheezes. She has no rales. She exhibits no tenderness.  Abdominal: Soft. Bowel sounds are normal. She exhibits no distension and no mass. There is no tenderness. There is no rebound and no guarding.  Musculoskeletal: Normal range of motion. She exhibits no edema and no tenderness.  Lymphadenopathy:    She has no cervical adenopathy.  Neurological: She is oriented to person, place, and time.  Skin: Skin is warm and dry. No rash noted. She is  not diaphoretic. No erythema. No pallor.  Psychiatric: She has a  normal mood and affect. Her behavior is normal. Judgment and thought content normal.     Lab Results  Component Value Date   WBC 8.4 02/06/2013   HGB 13.8 02/06/2013   HCT 41.3 02/06/2013   PLT 251 02/06/2013   GLUCOSE 108* 02/06/2013   CHOL 155 07/26/2012   TRIG 96.0 07/26/2012   HDL 60.30 07/26/2012   LDLCALC 76 07/26/2012   ALT 18 02/06/2013   AST 15 02/06/2013   NA 142 02/06/2013   K 4.0 02/06/2013   CL 108* 02/06/2013   CREATININE 0.9 02/06/2013   BUN 18.3 02/06/2013   CO2 24 02/06/2013   TSH 4.00 07/26/2012   INR 2.2 03/13/2011       Assessment & Plan:

## 2013-07-31 NOTE — Assessment & Plan Note (Signed)
She tells me that her pain is resolving/improving She is tapering off of the opiates

## 2013-08-11 ENCOUNTER — Telehealth: Payer: Self-pay | Admitting: Internal Medicine

## 2013-08-11 MED ORDER — AMOXICILLIN 875 MG PO TABS: 875.0000 mg | ORAL_TABLET | Freq: Two times a day (BID) | ORAL | Status: DC

## 2013-08-11 NOTE — Telephone Encounter (Signed)
Pt called stated that she was here for an ov last week for sore throat and Dr. Yetta Barre gave her antibiotic. Pt stated that the symptoms is coming back and she was wondering if Dr. Yetta Barre can give her something for this problem. Please advise.

## 2013-08-11 NOTE — Telephone Encounter (Signed)
Inform pt of Dr.jones's massage. Pt is aware that the medication is at the drug store.

## 2013-08-11 NOTE — Telephone Encounter (Signed)
Try amoxil

## 2013-09-05 ENCOUNTER — Telehealth: Payer: Self-pay

## 2013-09-05 DIAGNOSIS — M1990 Unspecified osteoarthritis, unspecified site: Secondary | ICD-10-CM

## 2013-09-05 DIAGNOSIS — M545 Low back pain: Secondary | ICD-10-CM

## 2013-09-05 NOTE — Telephone Encounter (Signed)
The patient called hoping to get a rx for pain medication (she states she was previously on oxycodone) Due to shoulder pain.   Pt's callback [phone]

## 2013-09-06 ENCOUNTER — Encounter: Payer: Self-pay | Admitting: Internal Medicine

## 2013-09-06 ENCOUNTER — Other Ambulatory Visit: Payer: Self-pay | Admitting: Internal Medicine

## 2013-09-06 DIAGNOSIS — M545 Low back pain: Secondary | ICD-10-CM

## 2013-09-06 DIAGNOSIS — M1990 Unspecified osteoarthritis, unspecified site: Secondary | ICD-10-CM

## 2013-09-06 MED ORDER — OXYCODONE HCL 10 MG PO TABS: 10.0000 mg | ORAL_TABLET | Freq: Four times a day (QID) | ORAL | Status: DC | PRN

## 2013-09-06 NOTE — Telephone Encounter (Signed)
Pt.notified

## 2013-09-06 NOTE — Telephone Encounter (Signed)
Done, please ask her to come pick this up  Joyce Payne

## 2013-09-27 ENCOUNTER — Ambulatory Visit (INDEPENDENT_AMBULATORY_CARE_PROVIDER_SITE_OTHER): Payer: 59 | Admitting: Internal Medicine

## 2013-09-27 ENCOUNTER — Ambulatory Visit (INDEPENDENT_AMBULATORY_CARE_PROVIDER_SITE_OTHER)
Admission: RE | Admit: 2013-09-27 | Discharge: 2013-09-27 | Disposition: A | Discharge status: Home / Self Care | Payer: 59 | Source: Ambulatory Visit | Attending: Internal Medicine | Admitting: Internal Medicine

## 2013-09-27 ENCOUNTER — Encounter: Payer: Self-pay | Admitting: Internal Medicine

## 2013-09-27 VITALS — BP 128/84 | HR 82 | Temp 98.5°F | Resp 16 | Ht 67.0 in | Wt 246.0 lb

## 2013-09-27 DIAGNOSIS — S8990XA Unspecified injury of unspecified lower leg, initial encounter: Secondary | ICD-10-CM

## 2013-09-27 DIAGNOSIS — M1712 Unilateral primary osteoarthritis, left knee: Secondary | ICD-10-CM

## 2013-09-27 DIAGNOSIS — M1990 Unspecified osteoarthritis, unspecified site: Secondary | ICD-10-CM

## 2013-09-27 DIAGNOSIS — M1710 Unilateral primary osteoarthritis, unspecified knee: Secondary | ICD-10-CM

## 2013-09-27 DIAGNOSIS — S8992XA Unspecified injury of left lower leg, initial encounter: Secondary | ICD-10-CM

## 2013-09-27 DIAGNOSIS — M545 Low back pain: Secondary | ICD-10-CM

## 2013-09-27 MED ORDER — OXYCODONE HCL 10 MG PO TABS: 10.0000 mg | ORAL_TABLET | Freq: Four times a day (QID) | ORAL | Status: DC | PRN

## 2013-09-27 MED ORDER — METHYLPREDNISOLONE ACETATE 40 MG/ML IJ SUSP: 40.0000 mg | Freq: Once | INTRAMUSCULAR | Status: DC

## 2013-09-27 MED ORDER — METHYLPREDNISOLONE ACETATE 80 MG/ML IJ SUSP
40.0000 mg | Freq: Once | INTRAMUSCULAR | Status: AC
  Administered 2013-09-27: 40 mg via INTRAMUSCULAR

## 2013-09-27 NOTE — Progress Notes (Signed)
Pre visit review using our clinic review tool, if applicable. No additional management support is needed unless otherwise documented below in the visit note. 

## 2013-09-27 NOTE — Patient Instructions (Signed)
Osteoarthritis Osteoarthritis is the most common form of arthritis. It is redness, soreness, and swelling (inflammation) affecting the cartilage. Cartilage acts as a cushion, covering the ends of bones where they meet to form a joint. CAUSES  Over time, the cartilage begins to wear away. This causes bone to rub on bone. This produces pain and stiffness in the affected joints. Factors that contribute to this problem are:  Excessive body weight.  Age.  Overuse of joints. SYMPTOMS   People with osteoarthritis usually experience joint pain, swelling, or stiffness.  Over time, the joint may lose its normal shape.  Small deposits of bone (osteophytes) may grow on the edges of the joint.  Bits of bone or cartilage can break off and float inside the joint space. This may cause more pain and damage.  Osteoarthritis can lead to depression, anxiety, feelings of helplessness, and limitations on daily activities. The most commonly affected joints are in the:  Ends of the fingers.  Thumbs.  Neck.  Lower back.  Knees.  Hips. DIAGNOSIS  Diagnosis is mostly based on your symptoms and exam. Tests may be helpful, including:  X-rays of the affected joint.  A computerized magnetic scan (MRI).  Blood tests to rule out other types of arthritis.  Joint fluid tests. This involves using a needle to draw fluid from the joint and examining the fluid under a microscope. TREATMENT  Goals of treatment are to control pain, improve joint function, maintain a normal body weight, and maintain a healthy lifestyle. Treatment approaches may include:  A prescribed exercise program with rest and joint relief.  Weight control with nutritional education.  Pain relief techniques such as:  Properly applied heat and cold.  Electric pulses delivered to nerve endings under the skin (transcutaneous electrical nerve stimulation, TENS).  Massage.  Certain supplements. Ask your caregiver before using any  supplements, especially in combination with prescribed drugs.  Medicines to control pain, such as:  Acetaminophen.  Nonsteroidal anti-inflammatory drugs (NSAIDs), such as naproxen.  Narcotic or central-acting agents, such as tramadol. This drug carries a risk of addiction and is generally prescribed for short-term use.  Corticosteroids. These can be given orally or as injection. This is a short-term treatment, not recommended for routine use.  Surgery to reposition the bones and relieve pain (osteotomy) or to remove loose pieces of bone and cartilage. Joint replacement may be needed in advanced states of osteoarthritis. HOME CARE INSTRUCTIONS  Your caregiver can recommend specific types of exercise. These may include:  Strengthening exercises. These are done to strengthen the muscles that support joints affected by arthritis. They can be performed with weights or with exercise bands to add resistance.  Aerobic activities. These are exercises, such as brisk walking or low-impact aerobics, that get your heart pumping. They can help keep your lungs and circulatory system in shape.  Range-of-motion activities. These keep your joints limber.  Balance and agility exercises. These help you maintain daily living skills. Learning about your condition and being actively involved in your care will help improve the course of your osteoarthritis. SEEK MEDICAL CARE IF:   You feel hot or your skin turns red.  You develop a rash in addition to your joint pain.  You have an oral temperature above 102 F (38.9 C). FOR MORE INFORMATION  National Institute of Arthritis and Musculoskeletal and Skin Diseases: www.niams.nih.gov National Institute on Aging: www.nia.nih.gov American College of Rheumatology: www.rheumatology.org Document Released: 09/28/2005 Document Revised: 12/21/2011 Document Reviewed: 01/09/2010 [employer] Patient Information 2014 [employer].  

## 2013-09-29 ENCOUNTER — Encounter: Payer: Self-pay | Admitting: Internal Medicine

## 2013-09-29 NOTE — Assessment & Plan Note (Signed)
Film shows mild DJD but no fracture

## 2013-09-29 NOTE — Assessment & Plan Note (Signed)
The joint was injected today - she will RICE for the remainder of today She will continue oxycodone as needed for pain

## 2013-09-29 NOTE — Progress Notes (Signed)
   Subjective:    Patient ID: Joyce Payne, female    DOB: 22-Jul-1961, 52 y.o.   MRN: [id number]  HPI  She fell 3 months ago and injured her left knee - initially there was some bruising and swelling, that resolved, but now she has persistent pain in the left knee.  Review of Systems  Constitutional: Negative.   HENT: Negative.   Eyes: Negative.   Respiratory: Negative.   Cardiovascular: Negative.  Negative for chest pain, palpitations and leg swelling.  Gastrointestinal: Negative.  Negative for abdominal pain.  Endocrine: Negative.   Musculoskeletal: Positive for arthralgias. Negative for back pain, gait problem, joint swelling, myalgias, neck pain and neck stiffness.  Skin: Negative.   Allergic/Immunologic: Negative.   Neurological: Negative.   Hematological: Negative.  Negative for adenopathy. Does not bruise/bleed easily.  Psychiatric/Behavioral: Negative.        Objective:   Physical Exam  Vitals reviewed. Constitutional: She is oriented to person, place, and time. She appears well-developed and well-nourished. No distress.  HENT:  Head: Normocephalic and atraumatic.  Mouth/Throat: Oropharynx is clear and moist. No oropharyngeal exudate.  Eyes: Conjunctivae are normal. Right eye exhibits no discharge. Left eye exhibits no discharge. No scleral icterus.  Neck: Normal range of motion. Neck supple. No JVD present. No tracheal deviation present. No thyromegaly present.  Cardiovascular: Normal rate, regular rhythm, normal heart sounds and intact distal pulses.  Exam reveals no gallop and no friction rub.   No murmur heard. Pulmonary/Chest: Effort normal and breath sounds normal. No stridor. No respiratory distress. She has no wheezes. She has no rales. She exhibits no tenderness.  Abdominal: Soft. Bowel sounds are normal. She exhibits no distension and no mass. There is no tenderness. There is no rebound and no guarding.  Musculoskeletal: Normal range of motion. She exhibits no  edema and no tenderness.       Left knee: She exhibits deformity (chronic DJD changes). She exhibits normal range of motion, no swelling, no effusion, no ecchymosis, no laceration, no erythema, normal alignment, no LCL laxity, normal patellar mobility and no bony tenderness.  L knee was cleaned with betadine. From the superior medial approach the joint space was injected with 1/2 cc 0.5 plain marcaine and 1/2 cc (40 mg ) depo-medrol IA. No fluid was aspirated. I used a 23 gauge 1.5 in needle, she tolerated it well with no complications of blood loss.  Lymphadenopathy:    She has no cervical adenopathy.  Neurological: She is oriented to person, place, and time.  Skin: Skin is warm and dry. No rash noted. She is not diaphoretic. No erythema. No pallor.          Assessment & Plan:

## 2013-10-18 ENCOUNTER — Other Ambulatory Visit: Payer: Self-pay | Admitting: Internal Medicine

## 2013-10-23 ENCOUNTER — Encounter: Payer: Self-pay | Admitting: Internal Medicine

## 2013-10-23 ENCOUNTER — Other Ambulatory Visit: Payer: Self-pay | Admitting: Internal Medicine

## 2013-10-23 DIAGNOSIS — M545 Low back pain, unspecified: Secondary | ICD-10-CM

## 2013-10-23 DIAGNOSIS — G8929 Other chronic pain: Secondary | ICD-10-CM

## 2013-10-23 DIAGNOSIS — M1712 Unilateral primary osteoarthritis, left knee: Secondary | ICD-10-CM

## 2013-10-23 MED ORDER — OXYCODONE HCL 10 MG PO TABS
10.0000 mg | ORAL_TABLET | Freq: Four times a day (QID) | ORAL | Status: DC | PRN
Start: 2013-10-23 — End: 2014-05-22

## 2013-11-23 ENCOUNTER — Other Ambulatory Visit: Payer: Self-pay | Admitting: Internal Medicine

## 2013-11-23 DIAGNOSIS — M1990 Unspecified osteoarthritis, unspecified site: Secondary | ICD-10-CM

## 2013-11-23 DIAGNOSIS — M545 Low back pain, unspecified: Secondary | ICD-10-CM

## 2013-11-23 MED ORDER — OXYCODONE HCL 10 MG PO TABS
10.0000 mg | ORAL_TABLET | Freq: Four times a day (QID) | ORAL | Status: DC | PRN
Start: 2013-11-23 — End: 2014-05-22

## 2013-12-11 ENCOUNTER — Encounter: Payer: Self-pay | Admitting: Oncology

## 2014-01-29 ENCOUNTER — Other Ambulatory Visit: Payer: 59

## 2014-02-12 ENCOUNTER — Ambulatory Visit: Payer: 59 | Admitting: Oncology

## 2014-02-13 ENCOUNTER — Ambulatory Visit: Payer: 59 | Admitting: Oncology

## 2014-05-08 ENCOUNTER — Other Ambulatory Visit: Payer: Self-pay | Admitting: Hematology and Oncology

## 2014-05-08 DIAGNOSIS — C8110 Nodular sclerosis classical Hodgkin lymphoma, unspecified site: Secondary | ICD-10-CM

## 2014-05-22 ENCOUNTER — Other Ambulatory Visit (HOSPITAL_BASED_OUTPATIENT_CLINIC_OR_DEPARTMENT_OTHER): Payer: 59

## 2014-05-22 ENCOUNTER — Ambulatory Visit (HOSPITAL_BASED_OUTPATIENT_CLINIC_OR_DEPARTMENT_OTHER): Payer: 59 | Admitting: Hematology and Oncology

## 2014-05-22 ENCOUNTER — Encounter: Payer: Self-pay | Admitting: Hematology and Oncology

## 2014-05-22 VITALS — BP 128/79 | HR 92 | Temp 98.4°F | Resp 18 | Ht 67.0 in | Wt 246.9 lb

## 2014-05-22 DIAGNOSIS — C8110 Nodular sclerosis classical Hodgkin lymphoma, unspecified site: Secondary | ICD-10-CM

## 2014-05-22 DIAGNOSIS — C8119 Nodular sclerosis classical Hodgkin lymphoma, extranodal and solid organ sites: Secondary | ICD-10-CM

## 2014-05-22 DIAGNOSIS — G8929 Other chronic pain: Secondary | ICD-10-CM

## 2014-05-22 LAB — CBC WITH DIFFERENTIAL/PLATELET
BASO%: 0.6 % (ref 0.0–2.0)
BASOS ABS: 0 10*3/uL (ref 0.0–0.1)
EOS ABS: 0.1 10*3/uL (ref 0.0–0.5)
EOS%: 0.8 % (ref 0.0–7.0)
HEMATOCRIT: 42.9 % (ref 34.8–46.6)
HEMOGLOBIN: 14.1 g/dL (ref 11.6–15.9)
LYMPH%: 24.6 % (ref 14.0–49.7)
MCH: 30.1 pg (ref 25.1–34.0)
MCHC: 32.9 g/dL (ref 31.5–36.0)
MCV: 91.6 fL (ref 79.5–101.0)
MONO#: 0.8 10*3/uL (ref 0.1–0.9)
MONO%: 9.5 % (ref 0.0–14.0)
NEUT%: 64.5 % (ref 38.4–76.8)
NEUTROS ABS: 5.6 10*3/uL (ref 1.5–6.5)
PLATELETS: 255 10*3/uL (ref 145–400)
RBC: 4.68 10*6/uL (ref 3.70–5.45)
RDW: 12.9 % (ref 11.2–14.5)
WBC: 8.6 10*3/uL (ref 3.9–10.3)
lymph#: 2.1 10*3/uL (ref 0.9–3.3)

## 2014-05-22 LAB — COMPREHENSIVE METABOLIC PANEL
ALT: 20 U/L (ref 0–35)
AST: 15 U/L (ref 0–37)
Albumin: 4 g/dL (ref 3.5–5.2)
Alkaline Phosphatase: 88 U/L (ref 39–117)
BUN: 21 mg/dL (ref 6–23)
CALCIUM: 8.5 mg/dL (ref 8.4–10.5)
CHLORIDE: 108 meq/L (ref 96–112)
CO2: 25 meq/L (ref 19–32)
CREATININE: 0.75 mg/dL (ref 0.50–1.10)
Glucose, Bld: 100 mg/dL — ABNORMAL HIGH (ref 70–99)
Potassium: 4.2 mEq/L (ref 3.5–5.3)
Sodium: 141 mEq/L (ref 135–145)
TOTAL PROTEIN: 5.8 g/dL — AB (ref 6.0–8.3)
Total Bilirubin: 0.3 mg/dL (ref 0.2–1.2)

## 2014-05-22 LAB — LACTATE DEHYDROGENASE: LDH: 137 U/L (ref 94–250)

## 2014-05-22 LAB — SEDIMENTATION RATE: SED RATE: 1 mm/h (ref 0–22)

## 2014-05-22 NOTE — Progress Notes (Signed)
[place] progress notes  Patient Care Team: Janith Lima, MD as PCP - General (Internal Medicine) Annia Belt, MD as Consulting Physician (Hematology and Oncology)  CHIEF COMPLAINTS/PURPOSE OF VISIT:  Hodgkin lymphoma  HISTORY OF PRESENTING ILLNESS:  Joyce Payne 53 y.o. female was transferred to my care after her prior physician has left.  I reviewed the patient's records extensive and collaborated the history with the patient. Summary of her history is as follows: This is a woman with stage IIA Hodgkin's lymphoma in remission.  She presented in mid-June of 2011 with a palpable lymph node mass in the right neck and supraclavicular area. She was evaluated by Dr. Nonda Lou at the [facility] in [place]. CT scan showed additional adenopathy in the right paratracheal region. Apparently, PET scanning also showed an abnormal activity in the left hip. An initial staging bone marrow biopsy was negative for lymphoma. It is really not clear whether or not the abnormality in the left hip was really a focus of involvement with Hodgkin's disease. I personally tend to doubt this. She was treated with 6 cycles of ABVD chemotherapy. Due to decline in her oxygen exchange, the bleomycin had to be deleted from the last 2 cycles.  She developed an extensive cellulitis of her right calf and thigh in November of 2011. Although lower extremity Dopplers were negative for clot, it was presumed that this was where the clot started. She was admitted to the hospital. Scanning incidentally showed pulmonary emboli at that time (08/25/10). She was anticoagulated for 1 year.  She completed all planned chemotherapy approximately 09/30/10. Interim scanning after 4 cycles showed a complete response. Subsequent studies done at time she moved to [facility] in June 2012 showed ongoing complete remission. Scans done 02/06/2013 show no evidence for new disease. An asymptomatic lucent  lesion in the anterior left femoral head is stable. She denies any recent fever, chills, night sweats or abnormal weight loss She denies recent infection. She denies recent lymphadenopathy. She has chronic arthritis, currently well-controlled with Celebrex. The mood disorder is stable. MEDICAL HISTORY:  Past Medical History  Diagnosis Date  . Anxiety   . Depression   . GERD (gastroesophageal reflux disease)   . History of DVT (deep vein thrombosis) NOV 2011- RIGHT LOWER EXTREMITY  . History of pulmonary embolism NOV 2011- BILATERAL PE'S    COUMDADIN THERAPY ENDED JUNE 2012  . 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 Impingement syndrome of left shoulder   . Arthritis of knee   . Arthritis of lumbar spine   . S/P gastric bypass   . Sinus infection   . Hodgkin's lymphoma, nodular sclerosis 02/15/2012    IIA NS Hodgkin's dx 6/11 Rx 6 ABVD in [place]  . H/O gastric bypass 02/15/2012    6/88  . Hx of pulmonary embolus 02/15/2012    Occurred during chemo for Hodgkin's dis 2011  . Depression with anxiety 02/16/2012  . Recurrent binge eating 02/16/2012    Reason for topomax    SURGICAL HISTORY: Past Surgical History  Procedure Laterality Date  . Gastric bypass  1999  . Patella fracture surgery    . Tonsilectomy, adenoidectomy, bilateral myringotomy and tubes  1985  . Lumbar disc surgery  2009  . Port-a-cath insertion  2011  . Shoulder closed reduction  01/18/2012    Procedure: CLOSED MANIPULATION SHOULDER;  Surgeon: Johnn Hai, MD;  Location: [facility];  Service: Orthopedics;  Laterality: Left;    SOCIAL HISTORY: History   Social History  . Marital Status: Single    Spouse Name: N/A    Number of Children: N/A  . Years of Education: N/A   Occupational History  . Quarry manager Tobacco   Social History Main Topics  . Smoking status: Former Smoker -- 10  years    Types: Cigarettes    Quit date: 10/22/2006  . Smokeless tobacco: Never Used  . Alcohol Use: No  . Drug Use: No  . Sexual Activity: Not Currently   Other Topics Concern  . Not on file   Social History Narrative   Regular exercise- NO    FAMILY HISTORY: Family History  Problem Relation Age of Onset  . Hypertension Other   . Cancer Other     colon, 1st degree relative<60  . Hypertension Mother   . Hyperlipidemia Mother   . Cancer Father     colon can    ALLERGIES:  is allergic to povidone-iodine.  MEDICATIONS:  Current Outpatient Prescriptions  Medication Sig Dispense Refill  . ABILIFY 2 MG tablet TAKE 2 TABLETS BY MOUTH AT BEDTIME  60 tablet  1  . amphetamine-dextroamphetamine (ADDERALL) 20 MG tablet       . BusPIRone HCl (BUSPAR PO) Take 25 mg by mouth 2 (two) times daily.       . celecoxib (CELEBREX) 200 MG capsule       . cholecalciferol (VITAMIN D) 1000 UNITS tablet Take 1,000 Units by mouth daily.       Marland Kitchen desvenlafaxine (PRISTIQ) 100 MG 24 hr tablet Take 100 mg by mouth daily.       Marland Kitchen LORazepam (ATIVAN) 1 MG tablet Take 1 tablet (1 mg total) by mouth 3 (three) times daily.  60 tablet  5  . Multiple Vitamin (MULTIVITAMIN) tablet Take 1 tablet by mouth daily.       . NP THYROID PO Take by mouth.      [name] Bicarbonate (ZEGERID OTC PO) Take by mouth every morning.       . traZODone (DESYREL) 100 MG tablet Take 100 mg by mouth at bedtime. Take 200 mg qhs      . VESICARE 5 MG tablet Take 5 mg by mouth daily.       . vitamin C (ASCORBIC ACID) 500 MG tablet Take 500 mg by mouth daily.        No current facility-administered medications for this visit.    REVIEW OF SYSTEMS:   Constitutional: Denies fevers, chills or abnormal night sweats Eyes: Denies blurriness of vision, double vision or watery eyes Ears, nose, mouth, throat, and face: Denies mucositis or sore throat Respiratory: Denies cough, dyspnea or wheezes Cardiovascular: Denies  palpitation, chest discomfort or lower extremity swelling Gastrointestinal:  Denies nausea, heartburn or change in bowel habits Skin: Denies abnormal skin rashes Lymphatics: Denies new lymphadenopathy or easy bruising Neurological:Denies numbness, tingling or new weaknesses Behavioral/Psych: Mood is stable, no new changes  All other systems were reviewed with the patient and are negative.  PHYSICAL EXAMINATION: ECOG PERFORMANCE STATUS: 0 - Asymptomatic  Filed Vitals:   05/22/14 0850  BP: 128/79  Pulse: 92  Temp: 98.4 F (36.9 C)  Resp: 18   Filed Weights   05/22/14 0850  Weight: 246 lb 14.4 oz (111.993 kg)    GENERAL:alert, no distress and comfortable. She is morbidly obese SKIN: skin  color, texture, turgor are normal, no rashes or significant lesions EYES: normal, conjunctiva are pink and non-injected, sclera clear OROPHARYNX:no exudate, normal lips, buccal mucosa, and tongue  NECK: supple, thyroid normal size, non-tender, without nodularity LYMPH:  no palpable lymphadenopathy in the cervical, axillary or inguinal LUNGS: clear to auscultation and percussion with normal breathing effort HEART: regular rate & rhythm and no murmurs without lower extremity edema ABDOMEN:abdomen soft, non-tender and normal bowel sounds Musculoskeletal:no cyanosis of digits and no clubbing  PSYCH: alert & oriented x 3 with fluent speech NEURO: no focal motor/sensory deficits  LABORATORY DATA:  I have reviewed the data as listed Lab Results  Component Value Date   WBC 8.6 05/22/2014   HGB 14.1 05/22/2014   HCT 42.9 05/22/2014   MCV 91.6 05/22/2014   PLT 255 05/22/2014   No results found for this basename: NA, K, CL, CO2, GLUCOSE, BUN, CREATININE, CALCIUM, GFRNONAA, GFRAA, PROT, ALBUMIN, AST, ALT, ALKPHOS, BILITOT, BILIDIR, IBILI,  in the last 8760 hours  ASSESSMENT & PLAN:  Hodgkin's lymphoma, nodular sclerosis Clinically, she has no evidence to suggest recurrence. I told the patient that is  no benefit for routine imaging study unless there are signs and symptoms suspicious disease recurrence. I will see her on a yearly basis with history, physical examination and blood work. I reinforced the importance of yearly vaccination program  Other chronic pain She will continue Celebrex. I reinforced the importance of vitamin D    Orders Placed This Encounter  Procedures  . CBC with Differential    Standing Status: Future     Number of Occurrences:      Standing Expiration Date: 06/26/2015  . Comprehensive metabolic panel    Standing Status: Future     Number of Occurrences:      Standing Expiration Date: 06/26/2015  . Lactate dehydrogenase    Standing Status: Future     Number of Occurrences:      Standing Expiration Date: 06/26/2015    All questions were answered. The patient knows to call the clinic with any problems, questions or concerns. I spent 15 minutes counseling the patient face to face. The total time spent in the appointment was 20 minutes and more than 50% was on counseling.     [place], [place], MD 05/22/2014 9:18 AM

## 2014-05-22 NOTE — Assessment & Plan Note (Signed)
Clinically, she has no evidence to suggest recurrence. I told the patient that is no benefit for routine imaging study unless there are signs and symptoms suspicious disease recurrence. I will see her on a yearly basis with history, physical examination and blood work. I reinforced the importance of yearly vaccination program

## 2014-05-22 NOTE — Assessment & Plan Note (Signed)
She will continue Celebrex. I reinforced the importance of vitamin D

## 2014-05-26 ENCOUNTER — Telehealth: Payer: Self-pay | Admitting: Hematology and Oncology

## 2014-05-26 NOTE — Telephone Encounter (Signed)
lmonvm for pt re appt for 05/23/15. pt also mychart active.

## 2014-06-27 ENCOUNTER — Encounter (HOSPITAL_COMMUNITY): Payer: Self-pay | Admitting: Emergency Medicine

## 2014-06-27 ENCOUNTER — Emergency Department (HOSPITAL_COMMUNITY): Payer: 59

## 2014-06-27 ENCOUNTER — Emergency Department (HOSPITAL_COMMUNITY)
Admission: EM | Admit: 2014-06-27 | Discharge: 2014-06-27 | Disposition: A | Discharge status: Home / Self Care | Payer: 59 | Attending: Emergency Medicine | Admitting: Emergency Medicine

## 2014-06-27 DIAGNOSIS — Z8781 Personal history of (healed) traumatic fracture: Secondary | ICD-10-CM | POA: Diagnosis not present

## 2014-06-27 DIAGNOSIS — Z86718 Personal history of other venous thrombosis and embolism: Secondary | ICD-10-CM | POA: Diagnosis not present

## 2014-06-27 DIAGNOSIS — S92309A Fracture of unspecified metatarsal bone(s), unspecified foot, initial encounter for closed fracture: Secondary | ICD-10-CM | POA: Insufficient documentation

## 2014-06-27 DIAGNOSIS — S8990XA Unspecified injury of unspecified lower leg, initial encounter: Secondary | ICD-10-CM | POA: Insufficient documentation

## 2014-06-27 DIAGNOSIS — S99929A Unspecified injury of unspecified foot, initial encounter: Secondary | ICD-10-CM

## 2014-06-27 DIAGNOSIS — F3289 Other specified depressive episodes: Secondary | ICD-10-CM | POA: Diagnosis not present

## 2014-06-27 DIAGNOSIS — F329 Major depressive disorder, single episode, unspecified: Secondary | ICD-10-CM | POA: Diagnosis not present

## 2014-06-27 DIAGNOSIS — Z86711 Personal history of pulmonary embolism: Secondary | ICD-10-CM | POA: Insufficient documentation

## 2014-06-27 DIAGNOSIS — Z79899 Other long term (current) drug therapy: Secondary | ICD-10-CM | POA: Diagnosis not present

## 2014-06-27 DIAGNOSIS — X500XXA Overexertion from strenuous movement or load, initial encounter: Secondary | ICD-10-CM | POA: Insufficient documentation

## 2014-06-27 DIAGNOSIS — Z791 Long term (current) use of non-steroidal anti-inflammatories (NSAID): Secondary | ICD-10-CM | POA: Diagnosis not present

## 2014-06-27 DIAGNOSIS — Z8571 Personal history of Hodgkin lymphoma: Secondary | ICD-10-CM | POA: Insufficient documentation

## 2014-06-27 DIAGNOSIS — Z87891 Personal history of nicotine dependence: Secondary | ICD-10-CM | POA: Diagnosis not present

## 2014-06-27 DIAGNOSIS — IMO0002 Reserved for concepts with insufficient information to code with codable children: Secondary | ICD-10-CM

## 2014-06-27 DIAGNOSIS — M1710 Unilateral primary osteoarthritis, unspecified knee: Secondary | ICD-10-CM | POA: Insufficient documentation

## 2014-06-27 DIAGNOSIS — S92302A Fracture of unspecified metatarsal bone(s), left foot, initial encounter for closed fracture: Secondary | ICD-10-CM

## 2014-06-27 DIAGNOSIS — Y9389 Activity, other specified: Secondary | ICD-10-CM | POA: Insufficient documentation

## 2014-06-27 DIAGNOSIS — Y9289 Other specified places as the place of occurrence of the external cause: Secondary | ICD-10-CM | POA: Insufficient documentation

## 2014-06-27 DIAGNOSIS — S99919A Unspecified injury of unspecified ankle, initial encounter: Secondary | ICD-10-CM

## 2014-06-27 DIAGNOSIS — K219 Gastro-esophageal reflux disease without esophagitis: Secondary | ICD-10-CM | POA: Insufficient documentation

## 2014-06-27 DIAGNOSIS — F411 Generalized anxiety disorder: Secondary | ICD-10-CM | POA: Diagnosis not present

## 2014-06-27 MED ORDER — HYDROCODONE-ACETAMINOPHEN 5-325 MG PO TABS
1.0000 | ORAL_TABLET | Freq: Once | ORAL | Status: AC
  Administered 2014-06-27: 1 via ORAL
  Filled 2014-06-27: qty 1

## 2014-06-27 MED ORDER — ONDANSETRON HCL 4 MG/2ML IJ SOLN
4.0000 mg | Freq: Once | INTRAMUSCULAR | Status: AC
  Administered 2014-06-27: 4 mg via INTRAVENOUS
  Filled 2014-06-27: qty 2

## 2014-06-27 MED ORDER — HYDROMORPHONE HCL PF 1 MG/ML IJ SOLN
1.0000 mg | Freq: Once | INTRAMUSCULAR | Status: AC
  Administered 2014-06-27: 1 mg via INTRAVENOUS
  Filled 2014-06-27: qty 1

## 2014-06-27 MED ORDER — OXYCODONE-ACETAMINOPHEN 5-325 MG PO TABS: 1.0000 | ORAL_TABLET | ORAL | Status: DC | PRN

## 2014-06-27 NOTE — ED Notes (Signed)
Called ortho tech 

## 2014-06-27 NOTE — ED Notes (Signed)
Pt to ed co of left foot pain. Pt stated falling in stairs last night. Pt presents with obvious bruising and swelling on left plantar and dorsal area. Pt alert and oriented.

## 2014-06-27 NOTE — ED Provider Notes (Signed)
CSN: [id number]     Arrival date & time 06/27/14  0867 History   First MD Initiated Contact with Patient 06/27/14 [phone]     Chief Complaint  Patient presents with  . Foot Injury    left foot     (Consider location/radiation/quality/duration/timing/severity/associated sxs/prior Treatment) HPI Comments: She presents to the ER for evaluation of left foot injury. Patient reports that she missed the last step coming down a flight of steps last night and twisted her foot. Patient and moderate severe, constant pain in the outside portion of the left foot. No ankle or upper leg pain. She did not fall or injure herself elsewhere.  Patient is a 53 y.o. female presenting with foot injury.  Foot Injury   Past Medical History  Diagnosis Date  . Anxiety   . Depression   . GERD (gastroesophageal reflux disease)   . History of DVT (deep vein thrombosis) NOV 2011- RIGHT LOWER EXTREMITY  . History of pulmonary embolism NOV 2011- BILATERAL PE'S    COUMDADIN THERAPY ENDED JUNE 2012  . 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 Impingement syndrome of left shoulder   . Arthritis of knee   . Arthritis of lumbar spine   . S/P gastric bypass   . Sinus infection   . Hodgkin's lymphoma, nodular sclerosis 02/15/2012    IIA NS Hodgkin's dx 6/11 Rx 6 ABVD in [place]  . H/O gastric bypass 02/15/2012    6/88  . Hx of pulmonary embolus 02/15/2012    Occurred during chemo for Hodgkin's dis 2011  . Depression with anxiety 02/16/2012  . Recurrent binge eating 02/16/2012    Reason for topomax   Past Surgical History  Procedure Laterality Date  . Gastric bypass  1999  . Patella fracture surgery    . Tonsilectomy, adenoidectomy, bilateral myringotomy and tubes  1985  . Lumbar disc surgery  2009  . Port-a-cath insertion  2011  . Shoulder closed reduction  01/18/2012    Procedure: CLOSED MANIPULATION SHOULDER;  Surgeon: Johnn Hai, MD;  Location: [facility];  Service: Orthopedics;  Laterality: Left;   Family History  Problem Relation Age of Onset  . Hypertension Other   . Cancer Other     colon, 1st degree relative<60  . Hypertension Mother   . Hyperlipidemia Mother   . Cancer Father     colon can   History  Substance Use Topics  . Smoking status: Former Smoker -- 10 years    Types: Cigarettes    Quit date: 10/22/2006  . Smokeless tobacco: Never Used  . Alcohol Use: No   OB History   Grav Para Term Preterm Abortions TAB SAB Ect Mult Living                 Review of Systems  Musculoskeletal:       Foot pain      Allergies  Povidone-iodine  Home Medications   Prior to Admission medications   Medication Sig Start Date End Date Taking? Authorizing Provider  amphetamine-dextroamphetamine (ADDERALL) 20 MG tablet Take 20 mg by mouth daily.  07/27/13  Yes Historical Provider, MD  ARIPiprazole (ABILIFY) 2 MG tablet Take 2 mg by mouth at bedtime.   Yes Historical Provider, MD  BusPIRone HCl (BUSPAR PO) Take 25 mg by mouth 2 (two) times daily.    Yes Historical Provider, MD  celecoxib (CELEBREX) 200 MG capsule Take 200 mg by mouth daily.  04/27/14  Yes Historical Provider, MD  cholecalciferol (VITAMIN D) 1000 UNITS tablet Take 1,000 Units by mouth daily.    Yes Historical Provider, MD  desvenlafaxine (PRISTIQ) 100 MG 24 hr tablet Take 100 mg by mouth daily.    Yes Historical Provider, MD  LORazepam (ATIVAN) 1 MG tablet Take 1 mg by mouth 3 (three) times daily.   Yes Historical Provider, MD  Multiple Vitamin (MULTIVITAMIN) tablet Take 1 tablet by mouth daily.    Yes Historical Provider, MD  Omeprazole-Sodium Bicarbonate (ZEGERID OTC PO) Take by mouth every morning.    Yes Historical Provider, MD  traZODone (DESYREL) 100 MG tablet Take 100 mg by mouth at bedtime. Take 200 mg qhs   Yes Historical Provider, MD  VESICARE 5 MG tablet Take 5 mg by mouth daily.  03/29/13  Yes Historical  Provider, MD  vitamin C (ASCORBIC ACID) 500 MG tablet Take 500 mg by mouth daily.    Yes Historical Provider, MD   BP 138/81  Pulse 116  Temp(Src) 97.8 F (36.6 C) (Oral)  Resp 14  Ht 5\' 8"  (1.727 m)  Wt 240 lb (108.863 kg)  BMI 36.50 kg/m2  SpO2 95% Physical Exam  Constitutional: She is oriented to person, place, and time. She appears well-developed and well-nourished.  HENT:  Head: Normocephalic and atraumatic.  Eyes: Pupils are equal, round, and reactive to light.  Neck: Normal range of motion.  Cardiovascular: Normal rate and regular rhythm.   Pulses:      Dorsalis pedis pulses are 2+ on the left side.  Pulmonary/Chest: Effort normal and breath sounds normal.  Musculoskeletal:       Left foot: She exhibits tenderness.       Feet:  Neurological: She is alert and oriented to person, place, and time. She has normal strength. No cranial nerve deficit or sensory deficit.  Skin:       ED Course  Procedures (including critical care time) Labs Review Labs Reviewed - No data to display  Imaging Review Dg Foot Complete Left  06/27/2014   CLINICAL DATA:  Pain post trauma  EXAM: LEFT FOOT - COMPLETE 3+ VIEW  COMPARISON:  None.  FINDINGS: Frontal, oblique, and lateral views were obtained. There is a comminuted fracture of the fourth metatarsal at the junction of the mid and distal thirds. There is mild dorsal displacement of the distal major fracture fragment with respect proximal major fragment. There is an obliquely oriented fracture at the junction of mid and distal thirds of the fifth metatarsal with mild displacement of fracture fragments with the distal fragment displaced slightly medially compared to the proximal fragment. There are more subtle nondisplaced fractures in the distal first, second, and third metatarsals. Alignment in these areas is anatomic.  No dislocation.  Joint spaces appear intact.  IMPRESSION: Displaced fractures of the fourth and fifth metatarsal with a  comminuted fracture of the fourth metatarsal. More subtle nondisplaced fractures of the distal first, second, and third metatarsals. No dislocations.   Electronically Signed   By: Lowella Grip M.D.   On: 06/27/2014 09:08     EKG Interpretation None      MDM   Final diagnoses:  None   metatarsal fractures, left foot  She presents to the ER for evaluation of left foot injury. Patient twisted and rolled her left foot tonight when she missed a step walking down a flight of stairs. There was no other  injury, she did not fall. Patient complaining of severe pain in the foot. X-ray confirms fractures of first through fifth metatarsals. No dislocation seen. Patient has normal neurologic function. No open wounds. Discussed briefly with Doctor Aluisio, who has reviewed the images. Recommend CAM walker with wrap of the foot, followup in the office.    Orpah Greek, MD 06/27/14 1003

## 2014-06-27 NOTE — Discharge Instructions (Signed)
Take an aspirin a day to help prevent blood clots.  Metatarsal Fracture, Undisplaced A metatarsal fracture is a break in the bone(s) of the foot. These are the bones of the foot that connect your toes to the bones of the ankle. DIAGNOSIS  The diagnoses of these fractures are usually made with X-rays. If there are problems in the forefoot and x-rays are normal a later bone scan will usually make the diagnosis.  TREATMENT AND HOME CARE INSTRUCTIONS  Treatment may or may not include a cast or walking shoe. When casts are needed the use is usually for short periods of time so as not to slow down healing with muscle wasting (atrophy).  Activities should be stopped until further advised by your caregiver.  Wear shoes with adequate shock absorbing capabilities and stiff soles.  Alternative exercise may be undertaken while waiting for healing. These may include bicycling and swimming, or as your caregiver suggests.  It is important to keep all follow-up visits or specialty referrals. The failure to keep these appointments could result in improper bone healing and chronic pain or disability.  Warning: Do not drive a car or operate a motor vehicle until your caregiver specifically tells you it is safe to do so. IF YOU DO NOT HAVE A CAST OR SPLINT:  You may walk on your injured foot as tolerated or advised.  Do not put any weight on your injured foot for as long as directed by your caregiver. Slowly increase the amount of time you walk on the foot as the pain allows or as advised.  Use crutches until you can bear weight without pain. A gradual increase in weight bearing may help.  Apply ice to the injury for 15-20 minutes each hour while awake for the first 2 days. Put the ice in a plastic bag and place a towel between the bag of ice and your skin.  Only take over-the-counter or prescription medicines for pain, discomfort, or fever as directed by your caregiver. SEEK IMMEDIATE MEDICAL CARE IF:    Your cast gets damaged or breaks.  You have continued severe pain or more swelling than you did before the cast was put on, or the pain is not controlled with medications.  Your skin or nails below the injury turn blue or grey, or feel cold or numb.  There is a bad smell, or new stains or pus-like (purulent) drainage coming from the cast. MAKE SURE YOU:   Understand these instructions.  Will watch your condition.  Will get help right away if you are not doing well or get worse. Document Released: 06/20/2002 Document Revised: 12/21/2011 Document Reviewed: 05/11/2008 [facility] Patient Information 2015 [place], [place]. This information is not intended to replace advice given to you by your health care provider. Make sure you discuss any questions you have with your health care provider.

## 2014-09-24 ENCOUNTER — Other Ambulatory Visit: Payer: Self-pay | Admitting: Obstetrics and Gynecology

## 2014-10-01 ENCOUNTER — Other Ambulatory Visit: Payer: Self-pay | Admitting: Obstetrics and Gynecology

## 2014-10-01 DIAGNOSIS — R921 Mammographic calcification found on diagnostic imaging of breast: Secondary | ICD-10-CM

## 2014-10-31 ENCOUNTER — Ambulatory Visit
Admission: RE | Admit: 2014-10-31 | Discharge: 2014-10-31 | Disposition: A | Discharge status: Home / Self Care | Payer: 59 | Source: Ambulatory Visit | Attending: Obstetrics and Gynecology | Admitting: Obstetrics and Gynecology

## 2014-10-31 DIAGNOSIS — R921 Mammographic calcification found on diagnostic imaging of breast: Secondary | ICD-10-CM

## 2014-12-05 ENCOUNTER — Other Ambulatory Visit: Payer: Self-pay | Admitting: Family Medicine

## 2014-12-05 DIAGNOSIS — E039 Hypothyroidism, unspecified: Secondary | ICD-10-CM

## 2014-12-13 ENCOUNTER — Ambulatory Visit
Admission: RE | Admit: 2014-12-13 | Discharge: 2014-12-13 | Disposition: A | Discharge status: Home / Self Care | Payer: 59 | Source: Ambulatory Visit | Attending: Family Medicine | Admitting: Family Medicine

## 2014-12-13 DIAGNOSIS — E039 Hypothyroidism, unspecified: Secondary | ICD-10-CM

## 2015-05-23 ENCOUNTER — Encounter: Payer: Self-pay | Admitting: Hematology and Oncology

## 2015-05-23 ENCOUNTER — Ambulatory Visit (HOSPITAL_BASED_OUTPATIENT_CLINIC_OR_DEPARTMENT_OTHER): Payer: 59 | Admitting: Hematology and Oncology

## 2015-05-23 ENCOUNTER — Other Ambulatory Visit (HOSPITAL_BASED_OUTPATIENT_CLINIC_OR_DEPARTMENT_OTHER): Payer: 59

## 2015-05-23 VITALS — BP 115/75 | HR 85 | Temp 98.0°F | Resp 18 | Ht 68.0 in | Wt 273.8 lb

## 2015-05-23 DIAGNOSIS — C8110 Nodular sclerosis classical Hodgkin lymphoma, unspecified site: Secondary | ICD-10-CM

## 2015-05-23 LAB — COMPREHENSIVE METABOLIC PANEL (CC13)
ALBUMIN: 3.8 g/dL (ref 3.5–5.0)
ALK PHOS: 90 U/L (ref 40–150)
ALT: 20 U/L (ref 0–55)
AST: 15 U/L (ref 5–34)
Anion Gap: 7 mEq/L (ref 3–11)
BUN: 21.5 mg/dL (ref 7.0–26.0)
CHLORIDE: 108 meq/L (ref 98–109)
CO2: 27 mEq/L (ref 22–29)
Calcium: 8.6 mg/dL (ref 8.4–10.4)
Creatinine: 0.9 mg/dL (ref 0.6–1.1)
EGFR: 72 mL/min/{1.73_m2} — ABNORMAL LOW (ref >90)
Glucose: 98 mg/dl (ref 70–140)
Potassium: 4.2 mEq/L (ref 3.5–5.1)
Sodium: 143 mEq/L (ref 136–145)
TOTAL PROTEIN: 6.3 g/dL — AB (ref 6.4–8.3)
Total Bilirubin: 0.3 mg/dL (ref 0.20–1.20)

## 2015-05-23 LAB — CBC WITH DIFFERENTIAL/PLATELET
BASO%: 0.3 % (ref 0.0–2.0)
BASOS ABS: 0 10*3/uL (ref 0.0–0.1)
EOS%: 0.7 % (ref 0.0–7.0)
Eosinophils Absolute: 0.1 10*3/uL (ref 0.0–0.5)
HEMATOCRIT: 43 % (ref 34.8–46.6)
HGB: 14.5 g/dL (ref 11.6–15.9)
LYMPH#: 2.1 10*3/uL (ref 0.9–3.3)
LYMPH%: 30.1 % (ref 14.0–49.7)
MCH: 31.1 pg (ref 25.1–34.0)
MCHC: 33.7 g/dL (ref 31.5–36.0)
MCV: 92.3 fL (ref 79.5–101.0)
MONO#: 0.5 10*3/uL (ref 0.1–0.9)
MONO%: 7 % (ref 0.0–14.0)
NEUT#: 4.4 10*3/uL (ref 1.5–6.5)
NEUT%: 61.9 % (ref 38.4–76.8)
Platelets: 221 10*3/uL (ref 145–400)
RBC: 4.66 10*6/uL (ref 3.70–5.45)
RDW: 13.2 % (ref 11.2–14.5)
WBC: 7.1 10*3/uL (ref 3.9–10.3)

## 2015-05-23 LAB — LACTATE DEHYDROGENASE (CC13): LDH: 224 U/L (ref 125–245)

## 2015-05-23 NOTE — Assessment & Plan Note (Signed)
Clinically, she has no evidence to suggest recurrence. I told the patient that is no benefit for routine imaging study unless there are signs and symptoms suspicious disease recurrence. She is almost a 5 year cancer survivor I plan to discharge the patient from the clinic.

## 2015-05-23 NOTE — Progress Notes (Signed)
[facility] OFFICE PROGRESS NOTE  Patient Care Team: Lona Kettle, MD as PCP - General (Family Medicine) Heath Lark, MD as Consulting Physician (Hematology and Oncology)  SUMMARY OF ONCOLOGIC HISTORY:  Joyce Payne was transferred to my care after her prior physician has left.  I reviewed the patient's records extensive and collaborated the history with the patient. Summary of her history is as follows: This is a woman with stage IIA Hodgkin's lymphoma in remission.  She presented in mid-June of 2011 with a palpable lymph node mass in the right neck and supraclavicular area. She was evaluated by Dr. Nonda Lou at the [facility] in [place]. CT scan showed additional adenopathy in the right paratracheal region. Apparently, PET scanning also showed an abnormal activity in the left hip. An initial staging bone marrow biopsy was negative for lymphoma. It is really not clear whether or not the abnormality in the left hip was really a focus of involvement with Hodgkin's disease. I personally tend to doubt this. She was treated with 6 cycles of ABVD chemotherapy. Due to decline in her oxygen exchange, the bleomycin had to be deleted from the last 2 cycles.  She developed an extensive cellulitis of her right calf and thigh in November of 2011. Although lower extremity Dopplers were negative for clot, it was presumed that this was where the clot started. She was admitted to the hospital. Scanning incidentally showed pulmonary emboli at that time (08/25/10). She was anticoagulated for 1 year.  She completed all planned chemotherapy approximately 09/30/10. Interim scanning after 4 cycles showed a complete response. Subsequent studies done at time she moved to [facility] in June 2012 showed ongoing complete remission. Scans done 02/06/2013 show no evidence for new disease. An asymptomatic lucent lesion in the anterior left femoral head is stable. INTERVAL HISTORY: Please see below  for problem oriented charting.  She denies any recent fever, chills, night sweats or abnormal weight loss She denies recent infection. She denies recent lymphadenopathy. She has chronic arthritis, currently well-controlled with Celebrex. The mood disorder is stable. The patient is about to be married soon.  REVIEW OF SYSTEMS:   Constitutional: Denies fevers, chills or abnormal weight loss Eyes: Denies blurriness of vision Ears, nose, mouth, throat, and face: Denies mucositis or sore throat Respiratory: Denies cough, dyspnea or wheezes Cardiovascular: Denies palpitation, chest discomfort or lower extremity swelling Gastrointestinal:  Denies nausea, heartburn or change in bowel habits Skin: Denies abnormal skin rashes Lymphatics: Denies new lymphadenopathy or easy bruising Neurological:Denies numbness, tingling or new weaknesses Behavioral/Psych: Mood is stable, no new changes  All other systems were reviewed with the patient and are negative.  I have reviewed the past medical history, past surgical history, social history and family history with the patient and they are unchanged from previous note.  ALLERGIES:  is allergic to povidone-iodine.  MEDICATIONS:  Current Outpatient Prescriptions  Medication Sig Dispense Refill  . amphetamine-dextroamphetamine (ADDERALL) 20 MG tablet Take 20 mg by mouth daily.     . ARIPiprazole (ABILIFY) 2 MG tablet Take 2 mg by mouth at bedtime.    . BusPIRone HCl (BUSPAR PO) Take 25 mg by mouth 2 (two) times daily.     . celecoxib (CELEBREX) 200 MG capsule Take 200 mg by mouth daily.     . cholecalciferol (VITAMIN D) 1000 UNITS tablet Take 1,000 Units by mouth daily.     Marland Kitchen desvenlafaxine (PRISTIQ) 100 MG 24 hr tablet Take 100 mg by mouth daily.     Marland Kitchen  LORazepam (ATIVAN) 1 MG tablet Take 1 mg by mouth 3 (three) times daily.    Marland Kitchen LYRICA 75 MG capsule Take 75 mg by mouth 2 (two) times daily.  5  . Multiple Vitamin (MULTIVITAMIN) tablet Take 1 tablet by  mouth daily.     [name] Bicarbonate (ZEGERID OTC PO) Take by mouth every morning.     . traZODone (DESYREL) 100 MG tablet Take 100 mg by mouth at bedtime. Take 200 mg qhs    . VESICARE 5 MG tablet Take 5 mg by mouth daily.     . vitamin C (ASCORBIC ACID) 500 MG tablet Take 500 mg by mouth daily.      No current facility-administered medications for this visit.    PHYSICAL EXAMINATION: ECOG PERFORMANCE STATUS: 0 - Asymptomatic  Filed Vitals:   05/23/15 1443  BP: 115/75  Pulse: 85  Temp: 98 F (36.7 C)  Resp: 18   Filed Weights   05/23/15 1443  Weight: 273 lb 12.8 oz (124.195 kg)    GENERAL:alert, no distress and comfortable. She is morbidly obese SKIN: skin color, texture, turgor are normal, no rashes or significant lesions EYES: normal, Conjunctiva are pink and non-injected, sclera clear OROPHARYNX:no exudate, no erythema and lips, buccal mucosa, and tongue normal  NECK: supple, thyroid normal size, non-tender, without nodularity LYMPH:  no palpable lymphadenopathy in the cervical, axillary or inguinal LUNGS: clear to auscultation and percussion with normal breathing effort HEART: regular rate & rhythm and no murmurs and no lower extremity edema ABDOMEN:abdomen soft, non-tender and normal bowel sounds Musculoskeletal:no cyanosis of digits and no clubbing  NEURO: alert & oriented x 3 with fluent speech, no focal motor/sensory deficits  LABORATORY DATA:  I have reviewed the data as listed    Component Value Date/Time   NA 143 05/23/2015 1432   NA 141 05/22/2014 0838   NA 141 02/04/2012 0944   K 4.2 05/23/2015 1432   K 4.2 05/22/2014 0838   K 4.3 02/04/2012 0944   CL 108 05/22/2014 0838   CL 108* 02/06/2013 1022   CL 100 02/04/2012 0944   CO2 27 05/23/2015 1432   CO2 25 05/22/2014 0838   CO2 28 02/04/2012 0944   GLUCOSE 98 05/23/2015 1432   GLUCOSE 100* 05/22/2014 0838   GLUCOSE 108* 02/06/2013 1022   GLUCOSE 105 02/04/2012 0944   BUN 21.5 05/23/2015  1432   BUN 21 05/22/2014 0838   BUN 24* 02/04/2012 0944   CREATININE 0.9 05/23/2015 1432   CREATININE 0.75 05/22/2014 0838   CREATININE 0.9 02/04/2012 0944   CALCIUM 8.6 05/23/2015 1432   CALCIUM 8.5 05/22/2014 0838   CALCIUM 8.6 02/04/2012 0944   PROT 6.3* 05/23/2015 1432   PROT 5.8* 05/22/2014 0838   PROT 6.3* 02/04/2012 0944   ALBUMIN 3.8 05/23/2015 1432   ALBUMIN 4.0 05/22/2014 0838   AST 15 05/23/2015 1432   AST 15 05/22/2014 0838   AST 27 02/04/2012 0944   ALT 20 05/23/2015 1432   ALT 20 05/22/2014 0838   ALT 25 02/04/2012 0944   ALKPHOS 90 05/23/2015 1432   ALKPHOS 88 05/22/2014 0838   ALKPHOS 78 02/04/2012 0944   BILITOT 0.30 05/23/2015 1432   BILITOT 0.3 05/22/2014 0838   BILITOT 0.70 02/04/2012 0944    No results found for: SPEP, UPEP  Lab Results  Component Value Date   WBC 7.1 05/23/2015   NEUTROABS 4.4 05/23/2015   HGB 14.5 05/23/2015   HCT 43.0 05/23/2015   MCV 92.3  05/23/2015   PLT 221 05/23/2015      Chemistry      Component Value Date/Time   NA 143 05/23/2015 1432   NA 141 05/22/2014 0838   NA 141 02/04/2012 0944   K 4.2 05/23/2015 1432   K 4.2 05/22/2014 0838   K 4.3 02/04/2012 0944   CL 108 05/22/2014 0838   CL 108* 02/06/2013 1022   CL 100 02/04/2012 0944   CO2 27 05/23/2015 1432   CO2 25 05/22/2014 0838   CO2 28 02/04/2012 0944   BUN 21.5 05/23/2015 1432   BUN 21 05/22/2014 0838   BUN 24* 02/04/2012 0944   CREATININE 0.9 05/23/2015 1432   CREATININE 0.75 05/22/2014 0838   CREATININE 0.9 02/04/2012 0944      Component Value Date/Time   CALCIUM 8.6 05/23/2015 1432   CALCIUM 8.5 05/22/2014 0838   CALCIUM 8.6 02/04/2012 0944   ALKPHOS 90 05/23/2015 1432   ALKPHOS 88 05/22/2014 0838   ALKPHOS 78 02/04/2012 0944   AST 15 05/23/2015 1432   AST 15 05/22/2014 0838   AST 27 02/04/2012 0944   ALT 20 05/23/2015 1432   ALT 20 05/22/2014 0838   ALT 25 02/04/2012 0944   BILITOT 0.30 05/23/2015 1432   BILITOT 0.3 05/22/2014 0838    BILITOT 0.70 02/04/2012 0944      ASSESSMENT & PLAN:  Hodgkin's lymphoma, nodular sclerosis Clinically, she has no evidence to suggest recurrence. I told the patient that is no benefit for routine imaging study unless there are signs and symptoms suspicious disease recurrence. She is almost a 5 year cancer survivor I plan to discharge the patient from the clinic.    No orders of the defined types were placed in this encounter.   All questions were answered. The patient knows to call the clinic with any problems, questions or concerns. No barriers to learning was detected. I spent 15 minutes counseling the patient face to face. The total time spent in the appointment was 20 minutes and more than 50% was on counseling and review of test results     [facility], [place], MD 05/23/2015 3:49 PM

## 2015-06-24 ENCOUNTER — Other Ambulatory Visit: Payer: Self-pay | Admitting: Orthopedic Surgery

## 2015-06-24 ENCOUNTER — Encounter (HOSPITAL_BASED_OUTPATIENT_CLINIC_OR_DEPARTMENT_OTHER): Payer: Self-pay | Admitting: *Deleted

## 2015-06-27 ENCOUNTER — Encounter (HOSPITAL_BASED_OUTPATIENT_CLINIC_OR_DEPARTMENT_OTHER): Payer: Self-pay | Admitting: *Deleted

## 2015-06-27 ENCOUNTER — Ambulatory Visit (HOSPITAL_BASED_OUTPATIENT_CLINIC_OR_DEPARTMENT_OTHER): Payer: 59 | Admitting: Anesthesiology

## 2015-06-27 ENCOUNTER — Encounter (HOSPITAL_BASED_OUTPATIENT_CLINIC_OR_DEPARTMENT_OTHER)
Admission: RE | Disposition: A | Discharge status: Home / Self Care | Payer: Self-pay | Source: Ambulatory Visit | Attending: Orthopedic Surgery

## 2015-06-27 ENCOUNTER — Ambulatory Visit (HOSPITAL_BASED_OUTPATIENT_CLINIC_OR_DEPARTMENT_OTHER)
Admission: RE | Admit: 2015-06-27 | Discharge: 2015-06-27 | Disposition: A | Discharge status: Home / Self Care | Payer: 59 | Source: Ambulatory Visit | Attending: Orthopedic Surgery | Admitting: Orthopedic Surgery

## 2015-06-27 DIAGNOSIS — Z87891 Personal history of nicotine dependence: Secondary | ICD-10-CM | POA: Insufficient documentation

## 2015-06-27 DIAGNOSIS — K219 Gastro-esophageal reflux disease without esophagitis: Secondary | ICD-10-CM | POA: Insufficient documentation

## 2015-06-27 DIAGNOSIS — S92352K Displaced fracture of fifth metatarsal bone, left foot, subsequent encounter for fracture with nonunion: Secondary | ICD-10-CM | POA: Diagnosis not present

## 2015-06-27 DIAGNOSIS — Z86718 Personal history of other venous thrombosis and embolism: Secondary | ICD-10-CM | POA: Insufficient documentation

## 2015-06-27 DIAGNOSIS — Z79899 Other long term (current) drug therapy: Secondary | ICD-10-CM | POA: Insufficient documentation

## 2015-06-27 DIAGNOSIS — Z86711 Personal history of pulmonary embolism: Secondary | ICD-10-CM | POA: Insufficient documentation

## 2015-06-27 DIAGNOSIS — Z8571 Personal history of Hodgkin lymphoma: Secondary | ICD-10-CM | POA: Diagnosis not present

## 2015-06-27 DIAGNOSIS — S92342K Displaced fracture of fourth metatarsal bone, left foot, subsequent encounter for fracture with nonunion: Secondary | ICD-10-CM | POA: Diagnosis present

## 2015-06-27 DIAGNOSIS — M1990 Unspecified osteoarthritis, unspecified site: Secondary | ICD-10-CM | POA: Insufficient documentation

## 2015-06-27 DIAGNOSIS — Z9221 Personal history of antineoplastic chemotherapy: Secondary | ICD-10-CM | POA: Insufficient documentation

## 2015-06-27 DIAGNOSIS — F418 Other specified anxiety disorders: Secondary | ICD-10-CM | POA: Insufficient documentation

## 2015-06-27 DIAGNOSIS — X58XXXD Exposure to other specified factors, subsequent encounter: Secondary | ICD-10-CM | POA: Insufficient documentation

## 2015-06-27 DIAGNOSIS — Z9884 Bariatric surgery status: Secondary | ICD-10-CM | POA: Diagnosis not present

## 2015-06-27 DIAGNOSIS — Z6841 Body Mass Index (BMI) 40.0 and over, adult: Secondary | ICD-10-CM | POA: Insufficient documentation

## 2015-06-27 HISTORY — DX: Other chronic pain: G89.29

## 2015-06-27 HISTORY — DX: Myoneural disorder, unspecified: G70.9

## 2015-06-27 HISTORY — PX: ORIF TOE FRACTURE: SHX5032

## 2015-06-27 HISTORY — DX: Dorsalgia, unspecified: M54.9

## 2015-06-27 SURGERY — OPEN REDUCTION INTERNAL FIXATION (ORIF) METATARSAL (TOE) FRACTURE
Anesthesia: Regional | Site: Foot | Laterality: Left

## 2015-06-27 MED ORDER — OXYCODONE HCL 5 MG/5ML PO SOLN: 5.0000 mg | Freq: Once | ORAL | Status: AC | PRN

## 2015-06-27 MED ORDER — HYDROMORPHONE HCL 1 MG/ML IJ SOLN
INTRAMUSCULAR | Status: AC
  Filled 2015-06-27: qty 1

## 2015-06-27 MED ORDER — MIDAZOLAM HCL 2 MG/2ML IJ SOLN
1.0000 mg | INTRAMUSCULAR | Status: DC | PRN
  Administered 2015-06-27: 2 mg via INTRAVENOUS

## 2015-06-27 MED ORDER — FENTANYL CITRATE (PF) 100 MCG/2ML IJ SOLN
50.0000 ug | INTRAMUSCULAR | Status: AC | PRN
  Administered 2015-06-27: 100 ug via INTRAVENOUS
  Administered 2015-06-27 (×4): 25 ug via INTRAVENOUS

## 2015-06-27 MED ORDER — ONDANSETRON HCL 4 MG/2ML IJ SOLN: 4.0000 mg | Freq: Once | INTRAMUSCULAR | Status: DC | PRN

## 2015-06-27 MED ORDER — BUPIVACAINE-EPINEPHRINE (PF) 0.5% -1:200000 IJ SOLN
INTRAMUSCULAR | Status: DC | PRN
  Administered 2015-06-27: 30 mL via PERINEURAL

## 2015-06-27 MED ORDER — HYDROMORPHONE HCL 1 MG/ML IJ SOLN
0.2500 mg | INTRAMUSCULAR | Status: DC | PRN
  Administered 2015-06-27: 0.5 mg via INTRAVENOUS

## 2015-06-27 MED ORDER — LIDOCAINE HCL (CARDIAC) 20 MG/ML IV SOLN
INTRAVENOUS | Status: DC | PRN
  Administered 2015-06-27: 50 mg via INTRAVENOUS

## 2015-06-27 MED ORDER — OXYCODONE HCL 5 MG PO TABS
ORAL_TABLET | ORAL | Status: AC
  Filled 2015-06-27: qty 1

## 2015-06-27 MED ORDER — DEXTROSE 5 % IV SOLN
3.0000 g | Freq: Once | INTRAVENOUS | Status: AC
  Administered 2015-06-27: 3 g via INTRAVENOUS

## 2015-06-27 MED ORDER — KETOROLAC TROMETHAMINE 30 MG/ML IJ SOLN: 30.0000 mg | Freq: Once | INTRAMUSCULAR | Status: DC

## 2015-06-27 MED ORDER — DEXAMETHASONE SODIUM PHOSPHATE 4 MG/ML IJ SOLN
INTRAMUSCULAR | Status: DC | PRN
  Administered 2015-06-27: 10 mg via INTRAVENOUS

## 2015-06-27 MED ORDER — FENTANYL CITRATE (PF) 100 MCG/2ML IJ SOLN
INTRAMUSCULAR | Status: AC
Start: 2015-06-27 — End: 2015-06-27
  Filled 2015-06-27: qty 2

## 2015-06-27 MED ORDER — CEFAZOLIN SODIUM-DEXTROSE 2-3 GM-% IV SOLR: 2.0000 g | INTRAVENOUS | Status: DC

## 2015-06-27 MED ORDER — ONDANSETRON HCL 4 MG/2ML IJ SOLN
INTRAMUSCULAR | Status: DC | PRN
  Administered 2015-06-27: 4 mg via INTRAVENOUS

## 2015-06-27 MED ORDER — OXYCODONE HCL 5 MG PO TABS
5.0000 mg | ORAL_TABLET | Freq: Once | ORAL | Status: AC | PRN
  Administered 2015-06-27: 5 mg via ORAL

## 2015-06-27 MED ORDER — FENTANYL CITRATE (PF) 100 MCG/2ML IJ SOLN
INTRAMUSCULAR | Status: AC
  Filled 2015-06-27: qty 4

## 2015-06-27 MED ORDER — SODIUM CHLORIDE 0.9 % IV SOLN: INTRAVENOUS | Status: DC

## 2015-06-27 MED ORDER — 0.9 % SODIUM CHLORIDE (POUR BTL) OPTIME
TOPICAL | Status: DC | PRN
  Administered 2015-06-27: 200 mL

## 2015-06-27 MED ORDER — LACTATED RINGERS IV SOLN
INTRAVENOUS | Status: DC
  Administered 2015-06-27 (×2): via INTRAVENOUS

## 2015-06-27 MED ORDER — GLYCOPYRROLATE 0.2 MG/ML IJ SOLN: 0.2000 mg | Freq: Once | INTRAMUSCULAR | Status: DC | PRN

## 2015-06-27 MED ORDER — CEFAZOLIN SODIUM 1-5 GM-% IV SOLN
INTRAVENOUS | Status: AC
  Filled 2015-06-27: qty 50

## 2015-06-27 MED ORDER — OXYCODONE HCL 5 MG PO TABS: 5.0000 mg | ORAL_TABLET | ORAL | Status: DC | PRN

## 2015-06-27 MED ORDER — PROPOFOL 10 MG/ML IV BOLUS
INTRAVENOUS | Status: DC | PRN
  Administered 2015-06-27: 250 mg via INTRAVENOUS

## 2015-06-27 MED ORDER — SCOPOLAMINE 1 MG/3DAYS TD PT72: 1.0000 | MEDICATED_PATCH | Freq: Once | TRANSDERMAL | Status: DC | PRN

## 2015-06-27 MED ORDER — MIDAZOLAM HCL 2 MG/2ML IJ SOLN
INTRAMUSCULAR | Status: AC
Start: 2015-06-27 — End: 2015-06-27
  Filled 2015-06-27: qty 2

## 2015-06-27 MED ORDER — CEFAZOLIN SODIUM-DEXTROSE 2-3 GM-% IV SOLR
INTRAVENOUS | Status: AC
  Filled 2015-06-27: qty 50

## 2015-06-27 MED ORDER — BUPIVACAINE-EPINEPHRINE 0.5% -1:200000 IJ SOLN
INTRAMUSCULAR | Status: DC | PRN
  Administered 2015-06-27: 17 mL

## 2015-06-27 MED ORDER — CHLORHEXIDINE GLUCONATE 4 % EX LIQD: 60.0000 mL | Freq: Once | CUTANEOUS | Status: DC

## 2015-06-27 SURGICAL SUPPLY — 80 items
BANDAGE ESMARK 6X9 LF (GAUZE/BANDAGES/DRESSINGS) IMPLANT
BIT DRILL 1.5X30 QC DISP (BIT) ×3 IMPLANT
BIT DRILL 2.0 (BIT) ×1
BIT DRILL 2.0MM (BIT) ×1
BIT DRILL 2XNS DISP SS SM FRAG (BIT) ×1 IMPLANT
BIT DRL 2XNS DISP SS SM FRAG (BIT) ×1
BLADE SURG 15 STRL LF DISP TIS (BLADE) ×2 IMPLANT
BLADE SURG 15 STRL SS (BLADE) ×4
BNDG COHESIVE 4X5 TAN STRL (GAUZE/BANDAGES/DRESSINGS) ×3 IMPLANT
BNDG COHESIVE 6X5 TAN STRL LF (GAUZE/BANDAGES/DRESSINGS) ×3 IMPLANT
BNDG CONFORM 2 STRL LF (GAUZE/BANDAGES/DRESSINGS) IMPLANT
BNDG ESMARK 4X9 LF (GAUZE/BANDAGES/DRESSINGS) ×3 IMPLANT
BNDG ESMARK 6X9 LF (GAUZE/BANDAGES/DRESSINGS)
CANISTER SUCT 1200ML W/VALVE (MISCELLANEOUS) ×3 IMPLANT
CHLORAPREP W/TINT 26ML (MISCELLANEOUS) ×3 IMPLANT
COVER BACK TABLE 60X90IN (DRAPES) ×3 IMPLANT
CUFF TOURNIQUET SINGLE 24IN (TOURNIQUET CUFF) ×3 IMPLANT
CUFF TOURNIQUET SINGLE 34IN LL (TOURNIQUET CUFF) IMPLANT
DECANTER SPIKE VIAL GLASS SM (MISCELLANEOUS) IMPLANT
DRAPE EXTREMITY T 121X128X90 (DRAPE) ×3 IMPLANT
DRAPE OEC MINIVIEW 54X84 (DRAPES) ×3 IMPLANT
DRAPE U-SHAPE 47X51 STRL (DRAPES) ×3 IMPLANT
DRSG MEPITEL 4X7.2 (GAUZE/BANDAGES/DRESSINGS) ×3 IMPLANT
DRSG PAD ABDOMINAL 8X10 ST (GAUZE/BANDAGES/DRESSINGS) ×6 IMPLANT
ELECT REM PT RETURN 9FT ADLT (ELECTROSURGICAL) ×3
ELECTRODE REM PT RTRN 9FT ADLT (ELECTROSURGICAL) ×1 IMPLANT
GAUZE SPONGE 4X4 12PLY STRL (GAUZE/BANDAGES/DRESSINGS) ×3 IMPLANT
GLOVE BIO SURGEON STRL SZ7.5 (GLOVE) ×3 IMPLANT
GLOVE BIO SURGEON STRL SZ8 (GLOVE) ×3 IMPLANT
GLOVE BIOGEL PI IND STRL 7.0 (GLOVE) ×1 IMPLANT
GLOVE BIOGEL PI IND STRL 7.5 (GLOVE) ×1 IMPLANT
GLOVE BIOGEL PI IND STRL 8 (GLOVE) ×2 IMPLANT
GLOVE BIOGEL PI INDICATOR 7.0 (GLOVE) ×2
GLOVE BIOGEL PI INDICATOR 7.5 (GLOVE) ×2
GLOVE BIOGEL PI INDICATOR 8 (GLOVE) ×4
GLOVE ECLIPSE 6.5 STRL STRAW (GLOVE) ×3 IMPLANT
GLOVE ECLIPSE 7.5 STRL STRAW (GLOVE) ×3 IMPLANT
GLOVE EXAM NITRILE MD LF STRL (GLOVE) ×6 IMPLANT
GOWN STRL REUS W/ TWL LRG LVL3 (GOWN DISPOSABLE) ×1 IMPLANT
GOWN STRL REUS W/ TWL XL LVL3 (GOWN DISPOSABLE) ×1 IMPLANT
GOWN STRL REUS W/TWL LRG LVL3 (GOWN DISPOSABLE) ×2
GOWN STRL REUS W/TWL XL LVL3 (GOWN DISPOSABLE) ×2
K-WIRE .054X4 (WIRE) IMPLANT
NEEDLE HYPO 22GX1.5 SAFETY (NEEDLE) IMPLANT
NS IRRIG 1000ML POUR BTL (IV SOLUTION) ×3 IMPLANT
PACK BASIN DAY SURGERY FS (CUSTOM PROCEDURE TRAY) ×3 IMPLANT
PAD CAST 4YDX4 CTTN HI CHSV (CAST SUPPLIES) ×2 IMPLANT
PADDING CAST ABS 4INX4YD NS (CAST SUPPLIES)
PADDING CAST ABS COTTON 4X4 ST (CAST SUPPLIES) IMPLANT
PADDING CAST COTTON 4X4 STRL (CAST SUPPLIES) ×4
PADDING CAST COTTON 6X4 STRL (CAST SUPPLIES) IMPLANT
PENCIL BUTTON HOLSTER BLD 10FT (ELECTRODE) ×3 IMPLANT
PLATE NCOMP 2H 2.0 STR (Plate) ×3 IMPLANT
SANITIZER HAND PURELL 535ML FO (MISCELLANEOUS) ×3 IMPLANT
SCREW 2.0 CORTICAL FT 20MM (Screw) ×3 IMPLANT
SCREW CORTICAL 2.0X10 (Screw) ×9 IMPLANT
SCREW CORTICAL 2.0X12 (Screw) ×12 IMPLANT
SCREW CORTICAL 2.0X14 (Screw) ×18 IMPLANT
SHEET MEDIUM DRAPE 40X70 STRL (DRAPES) ×3 IMPLANT
SLEEVE SCD COMPRESS KNEE MED (MISCELLANEOUS) ×3 IMPLANT
SPLINT FAST PLASTER 5X30 (CAST SUPPLIES) ×40
SPLINT PLASTER CAST FAST 5X30 (CAST SUPPLIES) ×20 IMPLANT
SPONGE LAP 18X18 X RAY DECT (DISPOSABLE) ×6 IMPLANT
SPONGE SURGIFOAM ABS GEL 12-7 (HEMOSTASIS) ×3 IMPLANT
STOCKINETTE 6  STRL (DRAPES) ×2
STOCKINETTE 6 STRL (DRAPES) ×1 IMPLANT
SUCTION FRAZIER TIP 10 FR DISP (SUCTIONS) ×3 IMPLANT
SUT ETHILON 3 0 PS 1 (SUTURE) ×3 IMPLANT
SUT FIBERWIRE #2 38 T-5 BLUE (SUTURE)
SUT MNCRL AB 3-0 PS2 18 (SUTURE) ×3 IMPLANT
SUT VIC AB 0 SH 27 (SUTURE) IMPLANT
SUT VIC AB 2-0 SH 27 (SUTURE)
SUT VIC AB 2-0 SH 27XBRD (SUTURE) IMPLANT
SUTURE FIBERWR #2 38 T-5 BLUE (SUTURE) IMPLANT
SYR BULB 3OZ (MISCELLANEOUS) ×3 IMPLANT
SYR CONTROL 10ML LL (SYRINGE) IMPLANT
TOWEL OR 17X24 6PK STRL BLUE (TOWEL DISPOSABLE) ×6 IMPLANT
TUBE CONNECTING 20'X1/4 (TUBING) ×1
TUBE CONNECTING 20X1/4 (TUBING) ×2 IMPLANT
UNDERPAD 30X30 (UNDERPADS AND DIAPERS) ×3 IMPLANT

## 2015-06-27 NOTE — Brief Op Note (Signed)
06/27/2015  4:37 PM  PATIENT:  Joyce Payne  54 y.o. 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  Open treatment of left 4th MT fracture nonunion 2.  Open treatment of left 5th MT fracture nonunion 3.  AP, oblique and lateral radiographs of the left foot  SURGEON:  Wylene Simmer, MD  ASSISTANT: n/a  ANESTHESIA:   General, regional  EBL:  minimal   TOURNIQUET:   Total Tourniquet Time Documented: Calf (Left) - 80 minutes Total: Calf (Left) - 80 minutes  COMPLICATIONS:  None apparent  DISPOSITION:  Extubated, awake and stable to recovery.  DICTATION ID:  [id number]

## 2015-06-27 NOTE — H&P (Signed)
Joyce Payne is an 54 y.o. female.   Chief Complaint:  Left foot pain HPI: 54 y/o female with left foot 4th and 5th MT non unions after displaced fractures earlier this year.  She presents now for open reduction and internal fixation with possible calcaneal bone grafting.    Past Medical History  Diagnosis Date  . Anxiety   . Depression   . GERD (gastroesophageal reflux disease)   . History of DVT (deep vein thrombosis) NOV 2011- RIGHT LOWER EXTREMITY  . History of pulmonary embolism NOV 2011- BILATERAL PE'S    COUMDADIN THERAPY ENDED JUNE 2012  . 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 Impingement syndrome of left shoulder   . Arthritis of knee   . Arthritis of lumbar spine   . S/P gastric bypass   . Sinus infection   . Hodgkin's lymphoma, nodular sclerosis 02/15/2012    IIA NS Hodgkin's dx 6/11 [place] 6 ABVD [place]  . H/O gastric bypass 02/15/2012    6/88  . Hx of pulmonary embolus 02/15/2012    Occurred during chemo for Hodgkin's dis 2011  . Depression with anxiety 02/16/2012  . Recurrent binge eating 02/16/2012    Reason for topomax  . Neuromuscular disorder   . Complication of anesthesia     had woken up during surgery, high tolerance to pain meds and anesthesia  . Chronic back pain     Past Surgical History  Procedure Laterality Date  . Gastric bypass  1999  . Patella fracture surgery    . Tonsilectomy, adenoidectomy, bilateral myringotomy and tubes  1985  . Lumbar disc surgery  2009  . Port-a-cath insertion  2011  . Shoulder closed reduction  01/18/2012    Procedure: CLOSED MANIPULATION SHOULDER;  Surgeon: Johnn Hai, MD;  Location: [facility];  Service: Orthopedics;  Laterality: Left;    Family History  Problem Relation Age of Onset  . Hypertension Other   . Cancer Other     colon, 1st degree relative<60  . Hypertension Mother   . Hyperlipidemia Mother    . Cancer Father     colon can   Social History:  reports that she quit smoking about 8 years ago. Her smoking use included Cigarettes. She quit after 10 years of use. She has never used smokeless tobacco. She reports that she does not drink alcohol or use illicit drugs.  Allergies:  Allergies  Allergen Reactions  . Povidone-Iodine Rash    Medications Prior to Admission  Medication Sig Dispense Refill  . amphetamine-dextroamphetamine (ADDERALL) 20 MG tablet Take 20 mg by mouth daily.     . ARIPiprazole (ABILIFY) 2 MG tablet Take 2 mg by mouth at bedtime.    . BusPIRone HCl (BUSPAR PO) Take 25 mg by mouth 2 (two) times daily.     . celecoxib (CELEBREX) 200 MG capsule Take 200 mg by mouth daily.     . cholecalciferol (VITAMIN D) 1000 UNITS tablet Take 1,000 Units by mouth daily.     Marland Kitchen desvenlafaxine (PRISTIQ) 100 MG 24 hr tablet Take 100 mg by mouth daily.     Marland Kitchen LORazepam (ATIVAN) 1 MG tablet Take 1 mg by mouth 3 (three) times daily.    Marland Kitchen LYRICA 75 MG capsule Take 100 mg by mouth 2 (two) times daily.   5  . Multiple Vitamin (MULTIVITAMIN) tablet Take 1  tablet by mouth daily.     [name] Bicarbonate (ZEGERID OTC PO) Take by mouth every morning.     . traZODone (DESYREL) 100 MG tablet Take 100 mg by mouth at bedtime. Take 200 mg qhs    . VESICARE 5 MG tablet Take 5 mg by mouth daily.     . vitamin C (ASCORBIC ACID) 500 MG tablet Take 500 mg by mouth daily.       No results found for this or any previous visit (from the past 48 hour(s)). No results found.  ROS  No recent f/c/n/v/wt loss  Blood pressure 119/61, pulse 84, temperature 98 F (36.7 C), temperature source Oral, resp. rate 20, height 5\' 8"  (1.727 m), weight 123.945 kg (273 lb 4 oz), last menstrual period 10/25/2012, SpO2 96 %. Physical Exam  wn wd woman in nad.  A and O x 4.  Mood and affect normal.  EOMi.  resp unlabord.  L foot with healthy skin. No lymphadenopathy.  2+ dp and pt pulses.  Feels LT normally  throughout the foot.  TTP at the 4th and 5th MT shafts.  Assessment/Plan L 4tha nd 5th MT nonunion - to OR for open treatment with ORIF and possible calcaneal autograft.  The risks and benefits of the alternative treatment options have been discussed in detail.  The patient wishes to proceed with surgery and specifically understands risks of bleeding, infection, nerve damage, blood clots, need for additional surgery, amputation and death.   Wylene Simmer 07-26-2015, 2:31 PM

## 2015-06-27 NOTE — Transfer of Care (Signed)
Immediate Anesthesia Transfer of Care Note  Patient: Joyce Payne  Procedure(s) Performed: Procedure(s): OPEN REDUCTION INTERNAL FIXATION (ORIF) LEFT FOURTH AND FIFTH METATARSAL (TOE) FRACTURE NONUNION (Left)  Patient Location: PACU  Anesthesia Type:GA combined with regional for post-op pain  Level of Consciousness: awake, sedated and patient cooperative  Airway & Oxygen Therapy: Patient Spontanous Breathing and Patient connected to face mask oxygen  Post-op Assessment: Report given to RN and Post -op Vital signs reviewed and stable  Post vital signs: Reviewed and stable  Last Vitals:  Filed Vitals:   06/27/15 1330  BP:   Pulse: 84  Temp:   Resp: 20    Complications: No apparent anesthesia complications

## 2015-06-27 NOTE — Discharge Instructions (Addendum)
Wylene Simmer, MD [facility]  Please read the following information regarding your care after surgery.  Medications  You only need a prescription for the narcotic pain medicine (ex. oxycodone, Percocet, Norco).  All of the other medicines listed below are available over the counter. X acetominophen (Tylenol) 650 mg every 4-6 hours as you need for minor pain X oxycodone as prescribed for moderate to severe pain   Narcotic pain medicine (ex. oxycodone, Percocet, Vicodin) will cause constipation.  To prevent this problem, take the following medicines while you are taking any pain medicine. X docusate sodium (Colace) 100 mg twice a day X senna (Senokot) 2 tablets twice a day  X To help prevent blood clots, take an aspirin (325 mg) once a day for a month after surgery.  You should also get up every hour while you are awake to move around.    Weight Bearing ? Bear weight when you are able on your operated leg or foot. ? Bear weight only on the heel of your operated foot in the post-op shoe. X Do not bear any weight on the operated leg or foot.  Cast / Splint / Dressing X Keep your splint or cast clean and dry.  Dont put anything (coat hanger, pencil, etc) down inside of it.  If it gets damp, use a hair dryer on the cool setting to dry it.  If it gets soaked, call the office to schedule an appointment for a cast change. ? Remove your dressing 3 days after surgery and cover the incisions with dry dressings.    After your dressing, cast or splint is removed; you may shower, but do not soak or scrub the wound.  Allow the water to run over it, and then gently pat it dry.  Swelling It is normal for you to have swelling where you had surgery.  To reduce swelling and pain, keep your toes above your nose for at least 3 days after surgery.  It may be necessary to keep your foot or leg elevated for several weeks.  If it hurts, it should be elevated.  Follow Up Call my office at [phone]  when you are discharged from the hospital or surgery center to schedule an appointment to be seen two weeks after surgery.  Call my office at [phone] if you develop a fever >101.5 F, nausea, vomiting, bleeding from the surgical site or severe pain.     Post Anesthesia Home Care Instructions  Activity: Get plenty of rest for the remainder of the day. A responsible adult should stay with you for 24 hours following the procedure.  For the next 24 hours, DO NOT: -Drive a car -Paediatric nurse -Drink alcoholic beverages -Take any medication unless instructed by your physician -Make any legal decisions or sign important papers.  Meals: Start with liquid foods such as gelatin or soup. Progress to regular foods as tolerated. Avoid greasy, spicy, heavy foods. If nausea and/or vomiting occur, drink only clear liquids until the nausea and/or vomiting subsides. Call your physician if vomiting continues.  Special Instructions/Symptoms: Your throat may feel dry or sore from the anesthesia or the breathing tube placed in your throat during surgery. If this causes discomfort, gargle with warm salt water. The discomfort should disappear within 24 hours.  If you had a scopolamine patch placed behind your ear for the management of post- operative nausea and/or vomiting:  1. The medication in the patch is effective for 72 hours, after which it should be removed.  Wrap patch in a tissue and discard in the trash. Wash hands thoroughly with soap and water. 2. You may remove the patch earlier than 72 hours if you experience unpleasant side effects which may include dry mouth, dizziness or visual disturbances. 3. Avoid touching the patch. Wash your hands with soap and water after contact with the patch.

## 2015-06-27 NOTE — Anesthesia Procedure Notes (Addendum)
Anesthesia Regional Block:  Popliteal block  Pre-Anesthetic Checklist: ,, timeout performed, Correct Patient, Correct Site, Correct Laterality, Correct Procedure, Correct Position, site marked, Risks and benefits discussed,  Surgical consent,  Pre-op evaluation,  At surgeon's request and post-op pain management  Laterality: Left  Prep: chloraprep       Needles:  Injection technique: Single-shot  Needle Type: Echogenic Stimulator Needle     Needle Length: 9cm 9 cm Needle Gauge: 21 and 21 G    Additional Needles:  Procedures: ultrasound guided (picture in chart) and nerve stimulator Popliteal block  Nerve Stimulator or Paresthesia:  Response: tibial, 0.5 mA,  Response: peroneal, 0.5 mA,   Additional Responses:   Narrative:  Start time: 06/27/2015 12:53 PM End time: 06/27/2015 1:04 PM Injection made incrementally with aspirations every 5 mL.  Events: blood aspirated  Performed by: Personally  Anesthesiologist: Suzette Battiest  Additional Notes: Risks and benefits discussed. Pt tolerated well with no immediate complications.   Procedure Name: LMA Insertion Date/Time: 06/27/2015 2:56 PM Performed by: Lyndee Leo Pre-anesthesia Checklist: Patient identified, Emergency Drugs available, Suction available and Patient being monitored Patient Re-evaluated:Patient Re-evaluated prior to inductionOxygen Delivery Method: Circle System Utilized Preoxygenation: Pre-oxygenation with 100% oxygen Intubation Type: IV induction Ventilation: Mask ventilation without difficulty LMA: LMA inserted LMA Size: 4.0 Number of attempts: 1 Airway Equipment and Method: Bite block Placement Confirmation: positive ETCO2 Tube secured with: Tape Dental Injury: Teeth and Oropharynx as per pre-operative assessment

## 2015-06-27 NOTE — Anesthesia Postprocedure Evaluation (Signed)
  Anesthesia Post-op Note  Patient: Joyce Payne  Procedure(s) Performed: Procedure(s): OPEN REDUCTION INTERNAL FIXATION (ORIF) LEFT FOURTH AND FIFTH METATARSAL (TOE) FRACTURE NONUNION (Left)  Patient Location: PACU  Anesthesia Type:GA combined with regional for post-op pain  Level of Consciousness: awake, alert  and oriented  Airway and Oxygen Therapy: Patient Spontanous Breathing  Post-op Pain: none  Post-op Assessment: Post-op Vital signs reviewed LLE Motor Response: Purposeful movement LLE Sensation: Decreased, Numbness          Post-op Vital Signs: Reviewed  Last Vitals:  Filed Vitals:   06/27/15 1645  BP:   Pulse:   Temp: 36.4 C  Resp:     Complications: No apparent anesthesia complications

## 2015-06-27 NOTE — Anesthesia Preprocedure Evaluation (Signed)
Anesthesia Evaluation  Patient identified by MRN, date of birth, ID band Patient awake    Reviewed: Allergy & Precautions, NPO status , Patient's Chart, lab work & pertinent test results  Airway Mallampati: II  TM Distance: >3 FB Neck ROM: Full    Dental  (+) Teeth Intact   Pulmonary former smoker,    breath sounds clear to auscultation       Cardiovascular negative cardio ROS   Rhythm:Regular Rate:Normal     Neuro/Psych Anxiety Depression negative neurological ROS     GI/Hepatic Neg liver ROS, GERD  ,  Endo/Other  Morbid obesity  Renal/GU negative Renal ROS     Musculoskeletal  (+) Arthritis ,   Abdominal   Peds  Hematology negative hematology ROS (+)   Anesthesia Other Findings   Reproductive/Obstetrics                             Anesthesia Physical Anesthesia Plan  ASA: III  Anesthesia Plan: General and Regional   Post-op Pain Management: GA combined w/ Regional for post-op pain   Induction: Intravenous  Airway Management Planned: LMA and Oral ETT  Additional Equipment:   Intra-op Plan:   Post-operative Plan: Extubation in OR  Informed Consent: I have reviewed the patients History and Physical, chart, labs and discussed the procedure including the risks, benefits and alternatives for the proposed anesthesia with the patient or authorized representative who has indicated his/her understanding and acceptance.   Dental advisory given  Plan Discussed with: CRNA  Anesthesia Plan Comments:         Anesthesia Quick Evaluation

## 2015-06-27 NOTE — Progress Notes (Signed)
Assisted Dr. Rob Fitzgerald with left, ultrasound guided, popliteal block. Side rails up, monitors on throughout procedure. See vital signs in flow sheet. Tolerated Procedure well. 

## 2015-06-28 ENCOUNTER — Encounter (HOSPITAL_BASED_OUTPATIENT_CLINIC_OR_DEPARTMENT_OTHER): Payer: Self-pay | Admitting: Orthopedic Surgery

## 2015-06-28 NOTE — Op Note (Signed)
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  PROCEDURE: 1. Open treatment of left 4th metatarsal fracture nonunion. 2. Open treatment of left 5th metatarsal fracture nonunion through a     separate incision. 3. AP oblique and lateral radiographs of the left foot.  SURGEON:  Wylene Simmer, MD.  ANESTHESIA:  General, regional.  ESTIMATED BLOOD LOSS:  Minimal.  TOURNIQUET TIME:  80 minutes at 250 mmHg.  COMPLICATIONS:  None apparent.  DISPOSITION:  Extubated, awake, and stable to recovery.  INDICATIONS FOR PROCEDURE:  The patient is a 54 year old woman, who fractured her left foot at early part of the summer.  She was treated in closed fashion.  She has gone on to what appeared to be painful nonunions of the fourth and fifth metatarsal fractures.  She presents now for operative treatment of these injuries.  She understands the risks, benefits, and the alternative treatment options, and elects surgical treatment.  She specifically understands risks of bleeding, infection, nerve damage, blood clots, need for additional surgery, continued pain,  amputation, and death.  PROCEDURE IN DETAIL:  After preoperative consent was obtained and the correct operative site was identified, the patient was brought to the operating room and placed supine on the operating table.  General anesthesia was induced.  Preoperative antibiotics were  administered. Surgical time-out was taken.  Left lower extremity was prepped and draped in standard sterile fashion.  Foot was exsanguinated and a  calf tourniquet was inflated to 200 mmHg.  A longitudinal incision was then made over the 4th metatarsal shaft.  Sharp dissection was carried down through skin and subcutaneous tissue.  The extensor tendons were retracted.  The fracture site was identified.  Fibrous tissue was cleared from the fracture site as well as hypertrophic fracture callus. The wound was irrigated copiously.  The fracture was pulled out to length and reduced.  It was held with a tenaculum.  AP oblique and lateral radiographs confirmed appropriate reduction.  A 2-mm lag screw from the [employer] Mini fragment set was then inserted.  It was noted to have excellent purchase.  Am 8-holed plate was then contoured to fit the lateral aspect of the metatarsal.  It was fixed proximally with 3 bicortical screws and distally with 3 bicortical screws.  AP, oblique, and lateral radiographs confirmed appropriate reduction of the fracture and appropriate position and length of all hardware.  The wound was irrigated and closed with Monocryl and nylon.  Attention was then turned to the fifth metatarsal, where a longitudinal incision was made.  Sharp dissection was carried down through skin and subcutaneous tissue.  The fracture site was identified.  It was cleaned of all fibrous tissue and reduced.  It was held with a tenaculum.  A 2- mm lag screw was inserted and was noted to have excellent purchase.  A 9- holed plate was then contoured to fit the lateral aspect of the metatarsal.  It was secured proximally with 3 bicortical screws and distally with 3 bicortical screws.  AP, oblique, and lateral radiographs confirmed appropriate position and length of all hardware and appropriate reduction of the fracture.  Wound was irrigated and closed with Monocryl and nylon.  Sterile dressings were applied after injecting the subcutaneous tissues with 0.5% Marcaine with epinephrine.  A well- padded short-leg splint was applied.  The tourniquet  was released at 80 minutes.  The patient was awakened from anesthesia and transported to the recovery room in stable condition.  FOLLOWUP PLAN:  The patient will be nonweightbearing on the left lower extremity.  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  She will follow up with me in 2 weeks for suture removal and conversion to a short-leg cast.  RADIOGRAPHS:  AP, oblique, and lateral radiographs of the left foot were obtained intraoperatively.  These show interval reduction and fixation of fourth and fifth metatarsal fracture nonunions.  Hardware is appropriately positioned and of appropriate length.  No other acute injuries are noted.     Wylene Simmer, MD     JH/MEDQ  D:  06/27/2015  T:  06/28/2015  Job:  [id number]

## 2016-01-16 ENCOUNTER — Other Ambulatory Visit: Payer: Self-pay | Admitting: Obstetrics and Gynecology

## 2016-01-16 ENCOUNTER — Other Ambulatory Visit: Payer: Self-pay | Admitting: Family Medicine

## 2016-01-16 ENCOUNTER — Other Ambulatory Visit: Payer: Self-pay

## 2016-01-16 DIAGNOSIS — R921 Mammographic calcification found on diagnostic imaging of breast: Secondary | ICD-10-CM

## 2016-01-16 DIAGNOSIS — Z780 Asymptomatic menopausal state: Secondary | ICD-10-CM

## 2016-01-30 ENCOUNTER — Other Ambulatory Visit: Payer: Self-pay | Admitting: Obstetrics and Gynecology

## 2016-01-30 ENCOUNTER — Ambulatory Visit
Admission: RE | Admit: 2016-01-30 | Discharge: 2016-01-30 | Disposition: A | Discharge status: Home / Self Care | Payer: BC Managed Care – PPO | Source: Ambulatory Visit | Attending: Obstetrics and Gynecology | Admitting: Obstetrics and Gynecology

## 2016-01-30 DIAGNOSIS — R921 Mammographic calcification found on diagnostic imaging of breast: Secondary | ICD-10-CM

## 2016-02-03 ENCOUNTER — Inpatient Hospital Stay: Admission: RE | Admit: 2016-02-03 | Payer: 59 | Source: Ambulatory Visit

## 2016-02-03 ENCOUNTER — Other Ambulatory Visit: Payer: Self-pay | Admitting: Family Medicine

## 2016-02-03 ENCOUNTER — Other Ambulatory Visit: Payer: Self-pay | Admitting: Obstetrics and Gynecology

## 2016-02-03 ENCOUNTER — Ambulatory Visit
Admission: RE | Admit: 2016-02-03 | Discharge: 2016-02-03 | Disposition: A | Discharge status: Home / Self Care | Payer: No Typology Code available for payment source | Source: Ambulatory Visit | Attending: Obstetrics and Gynecology | Admitting: Obstetrics and Gynecology

## 2016-02-03 DIAGNOSIS — R921 Mammographic calcification found on diagnostic imaging of breast: Secondary | ICD-10-CM

## 2016-02-03 DIAGNOSIS — Z712 Person consulting for explanation of examination or test findings: Secondary | ICD-10-CM

## 2016-02-14 ENCOUNTER — Other Ambulatory Visit: Payer: Self-pay | Admitting: Family Medicine

## 2016-02-14 DIAGNOSIS — E2839 Other primary ovarian failure: Secondary | ICD-10-CM

## 2016-02-18 ENCOUNTER — Ambulatory Visit
Admission: RE | Admit: 2016-02-18 | Discharge: 2016-02-18 | Disposition: A | Discharge status: Home / Self Care | Payer: BC Managed Care – PPO | Source: Ambulatory Visit | Attending: Family Medicine | Admitting: Family Medicine

## 2016-02-18 DIAGNOSIS — E2839 Other primary ovarian failure: Secondary | ICD-10-CM

## 2016-02-19 ENCOUNTER — Other Ambulatory Visit: Payer: Self-pay | Admitting: Specialist

## 2016-02-19 DIAGNOSIS — M5416 Radiculopathy, lumbar region: Secondary | ICD-10-CM

## 2016-03-02 ENCOUNTER — Other Ambulatory Visit: Payer: BC Managed Care – PPO

## 2016-06-24 ENCOUNTER — Emergency Department (HOSPITAL_COMMUNITY): Payer: BC Managed Care – PPO

## 2016-06-24 ENCOUNTER — Encounter (HOSPITAL_COMMUNITY): Payer: Self-pay | Admitting: Emergency Medicine

## 2016-06-24 ENCOUNTER — Inpatient Hospital Stay (HOSPITAL_COMMUNITY)
Admission: EM | Admit: 2016-06-24 | Discharge: 2016-06-27 | DRG: 176 | Disposition: A | Discharge status: Home / Self Care | Payer: BC Managed Care – PPO | Attending: Internal Medicine | Admitting: Internal Medicine

## 2016-06-24 ENCOUNTER — Inpatient Hospital Stay (HOSPITAL_COMMUNITY): Payer: BC Managed Care – PPO

## 2016-06-24 DIAGNOSIS — E669 Obesity, unspecified: Secondary | ICD-10-CM | POA: Diagnosis present

## 2016-06-24 DIAGNOSIS — Z9221 Personal history of antineoplastic chemotherapy: Secondary | ICD-10-CM

## 2016-06-24 DIAGNOSIS — I2601 Septic pulmonary embolism with acute cor pulmonale: Secondary | ICD-10-CM

## 2016-06-24 DIAGNOSIS — R51 Headache: Secondary | ICD-10-CM | POA: Diagnosis present

## 2016-06-24 DIAGNOSIS — G8929 Other chronic pain: Secondary | ICD-10-CM | POA: Diagnosis present

## 2016-06-24 DIAGNOSIS — Z800 Family history of malignant neoplasm of digestive organs: Secondary | ICD-10-CM | POA: Diagnosis not present

## 2016-06-24 DIAGNOSIS — Z9884 Bariatric surgery status: Secondary | ICD-10-CM

## 2016-06-24 DIAGNOSIS — G629 Polyneuropathy, unspecified: Secondary | ICD-10-CM | POA: Diagnosis present

## 2016-06-24 DIAGNOSIS — Z86711 Personal history of pulmonary embolism: Secondary | ICD-10-CM | POA: Diagnosis not present

## 2016-06-24 DIAGNOSIS — I82431 Acute embolism and thrombosis of right popliteal vein: Secondary | ICD-10-CM | POA: Diagnosis present

## 2016-06-24 DIAGNOSIS — I959 Hypotension, unspecified: Secondary | ICD-10-CM | POA: Diagnosis present

## 2016-06-24 DIAGNOSIS — Z888 Allergy status to other drugs, medicaments and biological substances status: Secondary | ICD-10-CM | POA: Diagnosis not present

## 2016-06-24 DIAGNOSIS — F418 Other specified anxiety disorders: Secondary | ICD-10-CM | POA: Diagnosis present

## 2016-06-24 DIAGNOSIS — I82411 Acute embolism and thrombosis of right femoral vein: Secondary | ICD-10-CM | POA: Diagnosis present

## 2016-06-24 DIAGNOSIS — Z87891 Personal history of nicotine dependence: Secondary | ICD-10-CM

## 2016-06-24 DIAGNOSIS — I272 Other secondary pulmonary hypertension: Secondary | ICD-10-CM | POA: Diagnosis present

## 2016-06-24 DIAGNOSIS — K219 Gastro-esophageal reflux disease without esophagitis: Secondary | ICD-10-CM | POA: Diagnosis present

## 2016-06-24 DIAGNOSIS — I2489 Other forms of acute ischemic heart disease: Secondary | ICD-10-CM

## 2016-06-24 DIAGNOSIS — Z8571 Personal history of Hodgkin lymphoma: Secondary | ICD-10-CM

## 2016-06-24 DIAGNOSIS — I2692 Saddle embolus of pulmonary artery without acute cor pulmonale: Secondary | ICD-10-CM | POA: Diagnosis present

## 2016-06-24 DIAGNOSIS — I2699 Other pulmonary embolism without acute cor pulmonale: Secondary | ICD-10-CM | POA: Diagnosis present

## 2016-06-24 DIAGNOSIS — M549 Dorsalgia, unspecified: Secondary | ICD-10-CM | POA: Diagnosis present

## 2016-06-24 DIAGNOSIS — I452 Bifascicular block: Secondary | ICD-10-CM | POA: Diagnosis present

## 2016-06-24 DIAGNOSIS — I4581 Long QT syndrome: Secondary | ICD-10-CM | POA: Diagnosis present

## 2016-06-24 DIAGNOSIS — I2602 Saddle embolus of pulmonary artery with acute cor pulmonale: Secondary | ICD-10-CM | POA: Diagnosis not present

## 2016-06-24 DIAGNOSIS — Z86718 Personal history of other venous thrombosis and embolism: Secondary | ICD-10-CM

## 2016-06-24 DIAGNOSIS — R55 Syncope and collapse: Secondary | ICD-10-CM | POA: Diagnosis present

## 2016-06-24 DIAGNOSIS — I248 Other forms of acute ischemic heart disease: Secondary | ICD-10-CM

## 2016-06-24 DIAGNOSIS — I2782 Chronic pulmonary embolism: Secondary | ICD-10-CM

## 2016-06-24 LAB — I-STAT TROPONIN, ED: Troponin i, poc: 0.23 ng/mL (ref 0.00–0.08)

## 2016-06-24 LAB — CBC
HCT: 44.8 % (ref 36.0–46.0)
Hemoglobin: 14.7 g/dL (ref 12.0–15.0)
MCH: 32.5 pg (ref 26.0–34.0)
MCHC: 32.8 g/dL (ref 30.0–36.0)
MCV: 99.1 fL (ref 78.0–100.0)
Platelets: 190 10*3/uL (ref 150–400)
RBC: 4.52 MIL/uL (ref 3.87–5.11)
RDW: 14.3 % (ref 11.5–15.5)
WBC: 10.5 10*3/uL (ref 4.0–10.5)

## 2016-06-24 LAB — BASIC METABOLIC PANEL
Anion gap: 9 (ref 5–15)
BUN: 26 mg/dL — ABNORMAL HIGH (ref 6–20)
CO2: 22 mmol/L (ref 22–32)
Calcium: 9.1 mg/dL (ref 8.9–10.3)
Chloride: 109 mmol/L (ref 101–111)
Creatinine, Ser: 0.79 mg/dL (ref 0.44–1.00)
GFR calc Af Amer: >60 mL/min (ref >60)
GFR calc non Af Amer: >60 mL/min (ref >60)
Glucose, Bld: 121 mg/dL — ABNORMAL HIGH (ref 65–99)
Potassium: 4 mmol/L (ref 3.5–5.1)
Sodium: 140 mmol/L (ref 135–145)

## 2016-06-24 LAB — BRAIN NATRIURETIC PEPTIDE: B Natriuretic Peptide: 424.4 pg/mL — ABNORMAL HIGH (ref 0.0–100.0)

## 2016-06-24 LAB — D-DIMER, QUANTITATIVE (NOT AT ARMC): D-Dimer, Quant: 10.59 ug/mL-FEU — ABNORMAL HIGH (ref 0.00–0.50)

## 2016-06-24 MED ORDER — IOPAMIDOL (ISOVUE-370) INJECTION 76%
INTRAVENOUS | Status: AC
  Administered 2016-06-24: 70 mL
  Filled 2016-06-24: qty 100

## 2016-06-24 MED ORDER — HYDROCODONE-ACETAMINOPHEN 10-325 MG PO TABS
1.0000 | ORAL_TABLET | Freq: Four times a day (QID) | ORAL | Status: DC | PRN
  Administered 2016-06-25 – 2016-06-27 (×6): 1 via ORAL
  Filled 2016-06-24 (×6): qty 1

## 2016-06-24 MED ORDER — TRAZODONE HCL 150 MG PO TABS
150.0000 mg | ORAL_TABLET | Freq: Every day | ORAL | Status: DC
  Administered 2016-06-25 – 2016-06-26 (×3): 150 mg via ORAL
  Filled 2016-06-24 (×4): qty 1

## 2016-06-24 MED ORDER — DEXTROSE-NACL 5-0.9 % IV SOLN
INTRAVENOUS | Status: DC
  Administered 2016-06-25 – 2016-06-26 (×3): via INTRAVENOUS

## 2016-06-24 MED ORDER — PREGABALIN 75 MG PO CAPS
150.0000 mg | ORAL_CAPSULE | Freq: Three times a day (TID) | ORAL | Status: DC
  Administered 2016-06-25 – 2016-06-27 (×8): 150 mg via ORAL
  Filled 2016-06-24 (×2): qty 3
  Filled 2016-06-24: qty 2
  Filled 2016-06-24 (×4): qty 3
  Filled 2016-06-24: qty 2

## 2016-06-24 MED ORDER — SODIUM CHLORIDE 0.9 % IV BOLUS (SEPSIS)
1000.0000 mL | Freq: Once | INTRAVENOUS | Status: AC
  Administered 2016-06-24: 1000 mL via INTRAVENOUS

## 2016-06-24 MED ORDER — HEPARIN (PORCINE) IN NACL 100-0.45 UNIT/ML-% IJ SOLN
1250.0000 [IU]/h | INTRAMUSCULAR | Status: DC
  Administered 2016-06-24: 1350 [IU]/h via INTRAVENOUS
  Administered 2016-06-25: 1250 [IU]/h via INTRAVENOUS
  Filled 2016-06-24 (×4): qty 250

## 2016-06-24 MED ORDER — DIAZEPAM 2 MG PO TABS
2.0000 mg | ORAL_TABLET | Freq: Three times a day (TID) | ORAL | Status: DC | PRN
  Filled 2016-06-24: qty 1

## 2016-06-24 MED ORDER — SODIUM CHLORIDE 0.9 % IV SOLN: 250.0000 mL | INTRAVENOUS | Status: DC | PRN

## 2016-06-24 MED ORDER — MORPHINE SULFATE (PF) 4 MG/ML IV SOLN
6.0000 mg | Freq: Once | INTRAVENOUS | Status: AC
  Administered 2016-06-24: 6 mg via INTRAVENOUS
  Filled 2016-06-24: qty 2

## 2016-06-24 MED ORDER — HEPARIN BOLUS VIA INFUSION
5000.0000 [IU] | Freq: Once | INTRAVENOUS | Status: AC
  Administered 2016-06-24: 5000 [IU] via INTRAVENOUS
  Filled 2016-06-24: qty 5000

## 2016-06-24 MED ORDER — FENTANYL CITRATE (PF) 100 MCG/2ML IJ SOLN
50.0000 ug | INTRAMUSCULAR | Status: DC | PRN
  Administered 2016-06-25: 50 ug via INTRAVENOUS

## 2016-06-24 NOTE — ED Notes (Signed)
Tried IV start for blood with 2 nurses, no success.

## 2016-06-24 NOTE — H&P (Signed)
Name: Joyce Payne MRN: [id number] DOB: [birth year]    ADMISSION DATE:  06/24/2016 CONSULTATION DATE:  06/24/2016  REFERRING MD :  ED, Wilson Singer  CHIEF COMPLAINT:  Dyspnea  BRIEF PATIENT DESCRIPTION: 55 year old with prior history of Hodgkin's lymphoma and DVT/PE in 2011 admitted with submassive pulmonary embolism  SIGNIFICANT EVENTS    STUDIES:  CT angiogram 9/13 extensive bilateral acute pulmonary emboli, distending the right pulmonary artery, RV/LV ratio 1.9 Head CT  9/13 >>   HISTORY OF PRESENT ILLNESS:  55 year old obese ex-smoker presented with an acute onset dyspnea for 2 days and chest heaviness. She was transferred by EMS from [facility] PCP to the ED, d-dimer was noted positive, CT angiogram of her chest confirmed extensive bilateral pulmonary emboli. She was hypotensive on arrival with blood pressure documented as low as 80s, she has now responded to 2 L of fluid with blood pressure in the 90s. She still complains of chest heaviness but breathing is somewhat improved on IV heparin  She reports 2-3 episodes of sinusitis over the last 8 weeks or which she has gotten 2 rounds of antibiotics. She reports leg swelling for about a week when she was at the beach with her partner Shaqua  She reports prior history of DVT/PE in 2011 after finishing chemotherapy for Hodgkin's lymphoma. She took Coumadin for about 6 months and did not have any problems. She reports mild headache for the last 3 weeks-which she describes as a migraine She has chronic back pain after back surgery and takes muscle relaxants and pain medications for this  She lost 50 pounds over the last 6 months on a diet. 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  has a past surgical history that includes Gastric bypass (1999); Patella fracture surgery; Tonsilectomy, adenoidectomy, bilateral myringotomy and tubes (1985); Lumbar disc surgery (2009); PORT-A-CATH INSERTION (2011); Shoulder Closed Reduction (01/18/2012); and ORIF toe fracture (Left, 06/27/2015). Prior to Admission medications   Medication Sig Start Date End Date Taking? Authorizing Provider  baclofen (LIORESAL) 10 MG tablet Take 10-20 mg by mouth 3 (three) times daily.   Yes Historical Provider, MD  celecoxib (CELEBREX) 200 MG capsule Take 200 mg by mouth 2 (two) times daily.   Yes Historical Provider, MD  Cyanocobalamin (B-12 PO) Take 1 tablet by mouth every 3 (three) days.   Yes Historical Provider, MD  diazepam (VALIUM) 2 MG tablet Take 2 mg by mouth 3 (three) times daily.    Yes Historical Provider, MD  DULoxetine (CYMBALTA) 60 MG capsule Take 60 mg by mouth daily.   Yes Historical Provider, MD  fesoterodine (TOVIAZ) 8 MG TB24 tablet Take 8 mg by mouth daily.   Yes Historical Provider, MD  fexofenadine (ALLEGRA) 180 MG tablet Take 180 mg by mouth daily.   Yes Historical Provider, MD  fluticasone (FLONASE) 50 MCG/ACT nasal spray Place 2 sprays into both nostrils daily.   Yes Historical Provider, MD  HYDROcodone-acetaminophen (NORCO) 10-325 MG tablet Take 1 tablet by mouth every 6 (six) hours as needed for moderate pain.    Yes Historical Provider, MD  levofloxacin (LEVAQUIN) 500 MG tablet Take 500 mg by mouth daily. STARTED 06/22/16, FOR 10 DAYS ENDING 07/01/16    Yes Historical Provider, MD  lisdexamfetamine (VYVANSE) 70 MG capsule Take 70 mg by mouth daily.   Yes Historical Provider, MD  Misc Natural Products (COLON CLEANSE) CAPS Take 7 capsules by mouth daily.   Yes Historical Provider, MD  Omeprazole-Sodium Bicarbonate (ZEGERID) 20-1100 MG CAPS capsule Take 1 capsule by mouth daily before breakfast.   Yes Historical Provider, MD  pregabalin (LYRICA) 150 MG capsule Take 150 mg by mouth 3 (three) times daily.   Yes Historical Provider, MD  tiZANidine (ZANAFLEX) 4 MG tablet Take 4 mg by mouth every 6 (six) hours as needed for muscle spasms.   Yes Historical Provider, MD  traZODone (DESYREL) 150 MG tablet Take 150 mg by mouth at bedtime.   Yes Historical Provider, MD  oxyCODONE (ROXICODONE) 5 MG immediate release tablet Take 1-2 tablets (5-10 mg total) by mouth every 4 (four) hours as needed for moderate pain or severe pain. Patient not taking: Reported on 06/24/2016 06/27/15   Wylene Simmer, MD   Allergies  Allergen Reactions  . Povidone-Iodine Rash    FAMILY HISTORY:  family history includes Cancer in her father and other; Hyperlipidemia in her mother; Hypertension in her mother and other. SOCIAL HISTORY:  reports that she quit smoking about 9 years ago. Her smoking use included Cigarettes. She quit after 10.00 years of use. She has never used smokeless tobacco. She reports that she does not drink alcohol or use drugs.  REVIEW OF SYSTEMS:  Positive as above  Constitutional: Negative for fever, chills, weight loss, malaise/fatigue and diaphoresis.  HENT: Negative for hearing loss, ear pain, nosebleeds, congestion, sore throat, neck pain, tinnitus and ear discharge.   Eyes: Negative for blurred vision, double vision, photophobia, pain, discharge and redness.  Respiratory: Negative for cough, hemoptysis, sputum production, wheezing and stridor.   Cardiovascular: Negative for  palpitations, orthopnea, claudication, leg swelling and PND.  Gastrointestinal:  Negative for heartburn, nausea, vomiting, abdominal pain, diarrhea, constipation, blood in stool and melena.  Genitourinary: Negative for dysuria, urgency, frequency, hematuria and flank pain.  Musculoskeletal: Negative for myalgias, back pain, joint pain and falls.  Skin: Negative for itching and rash.  Neurological: Negative for dizziness, tingling, tremors, sensory change, speech change, focal weakness, seizures, loss of consciousness, weakness and headaches.  Endo/Heme/Allergies: Negative for environmental allergies and polydipsia. Does not bruise/bleed easily.  SUBJECTIVE:   VITAL SIGNS: Temp:  [97.9 F (36.6 C)] 97.9 F (36.6 C) (09/13 1541) Pulse Rate:  [92-116] 92 (09/13 2100) Resp:  [13-26] 15 (09/13 2100) BP: (72-110)/(38-84) 86/64 (09/13 2100) SpO2:  [92 %-97 %] 94 % (09/13 2100) Weight:  [229 lb (103.9 kg)] 229 lb (103.9 kg) (09/13 1544)  PHYSICAL EXAMINATION: Gen. Pleasant, obese, in no distress, normal affect ENT - no lesions, no post nasal drip, class 2-3 airway Neck: No JVD, no thyromegaly, no carotid bruits Lungs: no use of accessory muscles, no dullness to percussion, decreased without rales or rhonchi  Cardiovascular: Rhythm regular, heart sounds  normal, no murmurs or gallops, no peripheral edema Abdomen: soft and non-tender, no hepatosplenomegaly, BS normal. Musculoskeletal: No deformities, no cyanosis or clubbing Neuro:  alert, non focal, no tremors    Recent Labs Lab 06/24/16 1712  NA 140  K 4.0  CL 109  CO2 22  BUN 26*  CREATININE 0.79  GLUCOSE 121*    Recent Labs Lab 06/24/16 1712  HGB 14.7  HCT 44.8  WBC 10.5  PLT 190   Dg Chest 2 View  Result Date: 06/24/2016 CLINICAL DATA:  Chest pain, shortness of breath over the last 2 days, dizziness EXAM: CHEST  2 VIEW COMPARISON:  Chest x-ray of 06/24/2016 FINDINGS: No active infiltrate or effusion is seen. Mediastinal and hilar contours are unremarkable. The heart is within upper limits of normal.  Healed right rib fractures are again noted. IMPRESSION: No active cardiopulmonary disease. Electronically Signed   By: Ivar Drape M.D.   On: 06/24/2016 16:49   Ct Angio Chest Pe W And/or Wo Contrast  Result Date: 06/24/2016 CLINICAL DATA:  Chest tightness, dyspnea history of DVT and Hodgkin's lymphoma EXAM: CT ANGIOGRAPHY CHEST WITH CONTRAST TECHNIQUE: Multidetector CT imaging of the chest was performed using the standard protocol during bolus administration of intravenous contrast. Multiplanar CT image reconstructions and MIPs were obtained to evaluate the vascular anatomy. CONTRAST:  70 cc Isovue 370 COMPARISON:  Radiograph 06/24/2016, CT thorax 02/06/2013 FINDINGS: Cardiovascular: There are extensive filling defect identified within the distal right main pulmonary artery, consistent with acute thrombus. Filling defects extend into the segmental branches of the right upper and middle lobes and segmental and subsegmental branches of the right lower lobe. Additional linear thrombus is visualized within the left main pulmonary artery with thrombus extending into segmental branches of left upper lobe and segmental and subsegmental branches of the left lower lobe. Right pulmonary artery is expanded by the thrombus. Right atrium and right ventricle do not appear enlarged. No pericardial effusion. Thoracic aorta is non aneurysmal. Trace fluid is present in the superior pericardial recess. Mediastinum/Nodes: No significant axillary adenopathy. No pathologically enlarged mediastinal lymph nodes are visualized. Trachea and mainstem bronchi are within normal limits. Lungs/Pleura: No significant pleural effusion. No pulmonary parenchymal nodules. Patchy deep dependent atelectasis. Hazy posterior lower lobe attenuation likely reflects atelectasis. No focal consolidations. Upper Abdomen: Upper abdominal images demonstrate grossly unremarkable appearance of the liver, spleen, and partially visualized adrenal glands.  Musculoskeletal: No acute osseous abnormality. No suspicious bone lesions are evident. Multiple old appearing right-sided rib fractures. Review of the MIP images confirms the above findings. IMPRESSION: Extensive bilateral acute pulmonary emboli, right greater than left as described above. Hazy attenuation of posterior lower lobes, likely reflecting atelectasis. Critical Value/emergent results were called by telephone at the time of interpretation on 06/24/2016 at 748 pm to Dr. Virgel Manifold , who verbally acknowledged these results. Electronically Signed   By: Donavan Foil M.D.   On: 06/24/2016 19:57    ASSESSMENT / PLAN:  Submassive pulmonary embolism with evidence of right heart strain on CT angiogram. Initial hypotension seems to have responded to fluids. She is only requiring 2 L oxygen. Note positive troponins  Recommend -I reviewed CT angiogram with the radiologist and discussed with interventional radiology. She would meet Criteria for catheter thrombolytic.  Given her new headache, we'll proceed with head CT. She does not have any other contraindications for thrombolytics.  Meanwhile continue heparin IV We'll obtain venous Doppler both lower extremities and echo.  Risk factors seem to be prior history of pulmonary embolism and Hodgkin's lymphoma. She would likely need anticoagulation lifelong.  Chronic back pain- would continue Norco, Lyrica for neuropathy and muscle relaxants  We'll keep her nothing by mouth except for liquids until procedure done   Kara Mead MD. FCCP.   Pulmonary & Critical care Pager [phone] If no response call [phone]   06/24/2016    06/24/2016, 10:03 PM

## 2016-06-24 NOTE — ED Provider Notes (Signed)
[facility] DEPT Provider Note   CSN: [id number] Arrival date & time: 06/24/16  1534     History   Chief Complaint Chief Complaint  Patient presents with  . Shortness of Breath  . Chest Pain    HPI Joyce Payne is a 55 y.o. female.  HPI   55 year old female with chief complaint is dyspnea. Progressively worsening over the past week or so. Around the time she began feeling SOB, she also had some swelling in her bilateral lower extremities. The swelling has improved but her breathing has not. It has actually progressively worsened. She feels better at rest. She is increasingly symptomatic with minimal exertion. She endorses orthopnea. She is hesitant to say she has chest pain but rather describes "tightness." Distant history of DVT several years ago while undergoing treatment for lymphoma. Not currently anticoagulated. No previous diagnosis of heart failure or other cardiac condition. She went to her PCPs office today. By report, she had a chest x-ray which was significant for interstitial edema. She denies any cough or fever. She is currently on Levaquin for sinusitis.    Past Medical History:  Diagnosis Date  . Anxiety   . Arthritis of knee   . Arthritis of lumbar spine   . Chronic back pain   . Complication of anesthesia    had woken up during surgery, high tolerance to pain meds and anesthesia  . Depression   . Depression with anxiety 02/16/2012  . GERD (gastroesophageal reflux disease)   . H/O gastric bypass 02/15/2012   6/88  . History of DVT (deep vein thrombosis) NOV 2011- RIGHT LOWER EXTREMITY  . 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 Hodgkin lymphoma ([place]) STAGE II  VERSUS IVa  ------ ONCOLOGIST- DR Beryle Beams   S/P CHEMOTHERAPY---   IN REMISSION FOR 15 MONTHS  . 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 Hx of pulmonary embolus 02/15/2012   Occurred during chemo for Hodgkin's dis 2011  . Impingement syndrome of left shoulder   . Neuromuscular disorder ([place])   . Recurrent binge eating 02/16/2012   Reason for topomax  . S/P gastric bypass   . Sinus infection     Patient Active Problem List   Diagnosis Date Noted  . Iron deficiency 10/20/2012  . Routine general medical examination at a health care facility 07/26/2012  . Other screening mammogram 07/26/2012  . OAB (overactive bladder) 04/28/2012  . Depression with anxiety 02/16/2012  . 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 H/O gastric bypass 02/15/2012  . Allergic rhinitis, cause unspecified 01/14/2012  . ANXIETY 11/11/2010  . DEPRESSION 11/11/2010  . Other chronic pain 11/11/2010  . GERD 11/11/2010  . 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 LOW BACK PAIN 11/11/2010    Past Surgical History:  Procedure Laterality Date  . GASTRIC BYPASS  1999  . [place] SURGERY  2009  . ORIF TOE FRACTURE Left 06/27/2015   Procedure: OPEN REDUCTION INTERNAL FIXATION (ORIF) LEFT FOURTH AND FIFTH METATARSAL (TOE) FRACTURE NONUNION;  Surgeon: Wylene Simmer, MD;  Location: [facility];  Service: Orthopedics;  Laterality: Left;  . PATELLA FRACTURE SURGERY    . PORT-A-CATH INSERTION  2011  . SHOULDER CLOSED REDUCTION  01/18/2012   Procedure: CLOSED MANIPULATION SHOULDER;  Surgeon: Johnn Hai, MD;  Location: [facility];  Service: Orthopedics;  Laterality: Left;  .  TONSILECTOMY, ADENOIDECTOMY, BILATERAL MYRINGOTOMY AND TUBES  1985    OB History    No data available       Home Medications    Prior to Admission medications   Medication Sig Start Date End Date Taking? Authorizing Provider  DULoxetine (CYMBALTA) 60 MG capsule Take 60 mg by mouth daily.   Yes Historical Provider, MD  amphetamine-dextroamphetamine (ADDERALL) 20 MG tablet Take 20 mg by mouth daily.  07/27/13   Historical Provider, MD  ARIPiprazole (ABILIFY) 2 MG tablet Take 2 mg by  mouth at bedtime.    Historical Provider, MD  BusPIRone HCl (BUSPAR PO) Take 25 mg by mouth 2 (two) times daily.     Historical Provider, MD  cholecalciferol (VITAMIN D) 1000 UNITS tablet Take 1,000 Units by mouth daily.     Historical Provider, MD  desvenlafaxine (PRISTIQ) 100 MG 24 hr tablet Take 100 mg by mouth daily.     Historical Provider, MD  LORazepam (ATIVAN) 1 MG tablet Take 1 mg by mouth 3 (three) times daily.    Historical Provider, MD  LYRICA 75 MG capsule Take 100 mg by mouth 2 (two) times daily.  04/27/15   Historical Provider, MD  Multiple Vitamin (MULTIVITAMIN) tablet Take 1 tablet by mouth daily.     Historical Provider, MD  Omeprazole-Sodium Bicarbonate (ZEGERID OTC PO) Take by mouth every morning.     Historical Provider, MD  oxyCODONE (ROXICODONE) 5 MG immediate release tablet Take 1-2 tablets (5-10 mg total) by mouth every 4 (four) hours as needed for moderate pain or severe pain. 06/27/15   Wylene Simmer, MD  traZODone (DESYREL) 100 MG tablet Take 100 mg by mouth at bedtime. Take 200 mg qhs    Historical Provider, MD  VESICARE 5 MG tablet Take 5 mg by mouth daily.  03/29/13   Historical Provider, MD  vitamin C (ASCORBIC ACID) 500 MG tablet Take 500 mg by mouth daily.     Historical Provider, MD    Family History Family History  Problem Relation Age of Onset  . Hypertension Mother   . Hyperlipidemia Mother   . Cancer Father     colon can  . Hypertension Other   . Cancer Other     colon, 1st degree relative<60    Social History Social History  Substance Use Topics  . Smoking status: Former Smoker    Years: 10.00    Types: Cigarettes    Quit date: 10/22/2006  . Smokeless tobacco: Never Used  . Alcohol use No     Allergies   Povidone-iodine   Review of Systems Review of Systems  All systems reviewed and negative, other than as noted in HPI.   Physical Exam Updated Vital Signs BP 105/77   Pulse 114   Temp 97.9 F (36.6 C) (Oral)   Resp 16   Ht 5\' 7"   (1.702 m)   Wt 229 lb (103.9 kg)   LMP 10/25/2012   SpO2 94% Comment: Simultaneous filing. User may not have seen previous data.  BMI 35.87 kg/m   Physical Exam  Constitutional: She appears well-developed and well-nourished. No distress.  HENT:  Head: Normocephalic and atraumatic.  Eyes: Conjunctivae are normal. Right eye exhibits no discharge. Left eye exhibits no discharge.  Neck: Neck supple.  Cardiovascular: Regular rhythm and normal heart sounds.  Exam reveals no gallop and no friction rub.   No murmur heard. Tachycardia  Pulmonary/Chest: Effort normal and breath sounds normal. No respiratory distress.  Decreased breath sounds bilaterally.  Abdominal: Soft. She exhibits no distension. There is no tenderness.  Musculoskeletal: She exhibits no edema or tenderness.  Lower extremities symmetric as compared to each other. Perhaps some mild edema. No calf tenderness.  Neurological: She is alert.  Skin: Skin is warm and dry.  Psychiatric: She has a normal mood and affect. Her behavior is normal. Thought content normal.  Nursing note and vitals reviewed.    ED Treatments / Results  Labs (all labs ordered are listed, but only abnormal results are displayed) Labs Reviewed  BASIC METABOLIC PANEL - Abnormal; Notable for the following:       Result Value   Glucose, Bld 121 (*)    BUN 26 (*)    All other components within normal limits  BRAIN NATRIURETIC PEPTIDE - Abnormal; Notable for the following:    B Natriuretic Peptide 424.4 (*)    All other components within normal limits  D-DIMER, QUANTITATIVE (NOT AT [facility]) - Abnormal; Notable for the following:    D-Dimer, Quant 10.59 (*)    All other components within normal limits  I-STAT TROPOININ, ED - Abnormal; Notable for the following:    Troponin i, poc 0.23 (*)    All other components within normal limits  CBC  HEPARIN LEVEL (UNFRACTIONATED)  HEPARIN LEVEL (UNFRACTIONATED)  CBC    EKG  EKG  Interpretation  Date/Time:  Wednesday June 24 2016 15:37:17 EDT Ventricular Rate:  121 PR Interval:    QRS Duration: 114 QT Interval:  355 QTC Calculation: 504 R Axis:   70 Text Interpretation:  Sinus tachycardia IRBBB and LPFB Low voltage with right axis deviation Borderline prolonged QT interval Confirmed by Wilson Singer  MD, Benn Tarver [id number]) on 06/24/2016 4:06:54 PM       Radiology No results found.  Procedures Procedures (including critical care time)  CRITICAL CARE Performed by: Virgel Manifold Total critical care time: 40 minutes Critical care time was exclusive of separately billable procedures and treating other patients. Critical care was necessary to treat or prevent imminent or life-threatening deterioration. Critical care was time spent personally by me on the following activities: development of treatment plan with patient and/or surrogate as well as nursing, discussions with consultants, evaluation of patient's response to treatment, examination of patient, obtaining history from patient or surrogate, ordering and performing treatments and interventions, ordering and review of laboratory studies, ordering and review of radiographic studies, pulse oximetry and re-evaluation of patient's condition.   Medications Ordered in ED Medications - No data to display   Initial Impression / Assessment and Plan / ED Course  I have reviewed the triage vital signs and the nursing notes.  Pertinent labs & imaging results that were available during my care of the patient were reviewed by me and considered in my medical decision making (see chart for details).  Clinical Course    55 year old female with progressive dyspnea. Reportedly, outpatient chest x-ray with vascular congestion, interstitial edema. I don't hear any adventitious breath sounds on exam, but it's limited by body habitus. EKG sinus tachycardia new RBBB and diffuse NS ST changes.   CXR here fairly clear. BNP is a  little elevated though. Will CT to r/o PE with her persistent tachycardia and significantly elevated d-dimer. Troponin is mildly elevated, but this can be seen with PE and my clinical concern for PE is higher than it is for ACS. She will be getting heparin empirically regardless.   CT unfortunately does show PE with pretty significant clot burden. Troponin is elevated. BP soft. Discussed  with Dr Lake Bells, [place].    Final Clinical Impressions(s) / ED Diagnoses   Final diagnoses:  Other acute pulmonary embolism [facility])    New Prescriptions New Prescriptions   No medications on file     Virgel Manifold, MD 06/28/16 [phone]

## 2016-06-24 NOTE — Progress Notes (Signed)
ANTICOAGULATION CONSULT NOTE - Initial Consult  Pharmacy Consult for heparin Indication: pulmonary embolus  Allergies  Allergen Reactions  . Povidone-Iodine Rash    Patient Measurements: Height: 5\' 7"  (170.2 cm) Weight: 229 lb (103.9 kg) IBW/kg (Calculated) : 61.6 Heparin Dosing Weight: 85 kg   Vital Signs: Temp: 97.9 F (36.6 C) (09/13 1541) Temp Source: Oral (09/13 1541) BP: 110/69 (09/13 1705) Pulse Rate: 113 (09/13 1705)  Labs:  Recent Labs  06/24/16 1712  HGB 14.7  HCT 44.8  PLT 190  CREATININE 0.79    Estimated Creatinine Clearance: 98.5 mL/min (by C-G formula based on SCr of 0.79 mg/dL).   Medical History: Past Medical History:  Diagnosis Date  . Anxiety   . Arthritis of knee   . Arthritis of lumbar spine   . Chronic back pain   . Complication of anesthesia    had woken up during surgery, high tolerance to pain meds and anesthesia  . Depression   . Depression with anxiety 02/16/2012  . GERD (gastroesophageal reflux disease)   . H/O gastric bypass 02/15/2012   6/88  . History of DVT (deep vein thrombosis) NOV 2011- RIGHT LOWER EXTREMITY  . 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 Hodgkin lymphoma ([place]) STAGE II  VERSUS IVa  ------ ONCOLOGIST- DR Beryle Beams   S/P CHEMOTHERAPY---   IN REMISSION FOR 15 MONTHS  . 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 Hx of pulmonary embolus 02/15/2012   Occurred during chemo for Hodgkin's dis 2011  . Impingement syndrome of left shoulder   . Neuromuscular disorder ([place])   . Recurrent binge eating 02/16/2012   Reason for topomax  . S/P gastric bypass   . Sinus infection    Assessment: 55 yo female admitted with dyspnea. Pharmacy consulted to dose heparin for possible pulmonary embolism. D-dimer elevated at 10.59 and CT ordered. Patient with history of DVT/PE. No current oral AC PTA at this time,  although per notes was on coumadin previously - which ended in 2012. CBC within normal limits, platelets normal at 190. No signs or symptoms of bleeding noted.   Goal of Therapy:  Heparin level 0.3-0.7 units/ml Monitor platelets by anticoagulation protocol: Yes   Plan:  Heparin bolus 5000 units once  Heparin gtt at 1350 units/hr Heparin level in 6 hours Daily heparin level and CBC Monitor for signs and symptoms of bleeding   Argie Ramming, PharmD Pharmacy Resident  Pager [phone] 06/24/16 6:11 PM

## 2016-06-24 NOTE — ED Triage Notes (Signed)
Pt arrives from home via [facility] c/o SOB with chest tightness x 2 days.  EMS reports pt satting at 92% RA, came up to 98% on 3L.  Pt reports dizziness, lightheadedness, denies LOC, N/V, cough.

## 2016-06-24 NOTE — ED Notes (Signed)
I-stat troponin result of 0.23 shown to Dr. Wilson Singer

## 2016-06-25 ENCOUNTER — Encounter (HOSPITAL_COMMUNITY): Payer: Self-pay | Admitting: *Deleted

## 2016-06-25 ENCOUNTER — Inpatient Hospital Stay (HOSPITAL_COMMUNITY): Payer: BC Managed Care – PPO

## 2016-06-25 DIAGNOSIS — I2699 Other pulmonary embolism without acute cor pulmonale: Secondary | ICD-10-CM

## 2016-06-25 DIAGNOSIS — I248 Other forms of acute ischemic heart disease: Secondary | ICD-10-CM

## 2016-06-25 DIAGNOSIS — I2602 Saddle embolus of pulmonary artery with acute cor pulmonale: Secondary | ICD-10-CM

## 2016-06-25 HISTORY — PX: IR GENERIC HISTORICAL: IMG1180011

## 2016-06-25 LAB — ECHOCARDIOGRAM COMPLETE
HEIGHTINCHES: 67 in
WEIGHTICAEL: 3753.11 [oz_av]

## 2016-06-25 LAB — CBC
HCT: 37.5 % (ref 36.0–46.0)
HCT: 37.8 % (ref 36.0–46.0)
HEMATOCRIT: 38.5 % (ref 36.0–46.0)
Hemoglobin: 12.1 g/dL (ref 12.0–15.0)
Hemoglobin: 12.1 g/dL (ref 12.0–15.0)
Hemoglobin: 11.9 g/dL — ABNORMAL LOW (ref 12.0–15.0)
MCH: 32.3 pg (ref 26.0–34.0)
MCH: 32.2 pg (ref 26.0–34.0)
MCH: 32.2 pg (ref 26.0–34.0)
MCHC: 32.3 g/dL (ref 30.0–36.0)
MCHC: 31.4 g/dL (ref 30.0–36.0)
MCHC: 31.5 g/dL (ref 30.0–36.0)
MCV: 100 fL (ref 78.0–100.0)
MCV: 102.4 fL — AB (ref 78.0–100.0)
MCV: 102.2 fL — ABNORMAL HIGH (ref 78.0–100.0)
PLATELETS: 126 10*3/uL — AB (ref 150–400)
Platelets: 139 10*3/uL — ABNORMAL LOW (ref 150–400)
Platelets: 146 10*3/uL — ABNORMAL LOW (ref 150–400)
RBC: 3.75 MIL/uL — ABNORMAL LOW (ref 3.87–5.11)
RBC: 3.76 MIL/uL — ABNORMAL LOW (ref 3.87–5.11)
RBC: 3.7 MIL/uL — ABNORMAL LOW (ref 3.87–5.11)
RDW: 14.4 % (ref 11.5–15.5)
RDW: 14.4 % (ref 11.5–15.5)
RDW: 14.4 % (ref 11.5–15.5)
WBC: 7.9 10*3/uL (ref 4.0–10.5)
WBC: 7.4 10*3/uL (ref 4.0–10.5)
WBC: 8 10*3/uL (ref 4.0–10.5)

## 2016-06-25 LAB — BASIC METABOLIC PANEL
Anion gap: 5 (ref 5–15)
BUN: 14 mg/dL (ref 6–20)
CALCIUM: 8.2 mg/dL — AB (ref 8.9–10.3)
CO2: 24 mmol/L (ref 22–32)
CREATININE: 0.57 mg/dL (ref 0.44–1.00)
Chloride: 115 mmol/L — ABNORMAL HIGH (ref 101–111)
GFR calc Af Amer: >60 mL/min (ref >60)
GFR calc non Af Amer: >60 mL/min (ref >60)
GLUCOSE: 127 mg/dL — AB (ref 65–99)
Potassium: 3.7 mmol/L (ref 3.5–5.1)
Sodium: 144 mmol/L (ref 135–145)

## 2016-06-25 LAB — GLUCOSE, CAPILLARY: GLUCOSE-CAPILLARY: 123 mg/dL — AB (ref 65–99)

## 2016-06-25 LAB — FIBRINOGEN
FIBRINOGEN: 521 mg/dL — AB (ref 210–475)
Fibrinogen: 452 mg/dL (ref 210–475)

## 2016-06-25 LAB — HEPARIN LEVEL (UNFRACTIONATED)
Heparin Unfractionated: 0.64 IU/mL (ref 0.30–0.70)
Heparin Unfractionated: 0.74 IU/mL — ABNORMAL HIGH (ref 0.30–0.70)
Heparin Unfractionated: 1.68 IU/mL — ABNORMAL HIGH (ref 0.30–0.70)

## 2016-06-25 LAB — MRSA PCR SCREENING: MRSA by PCR: NEGATIVE

## 2016-06-25 MED ORDER — BACLOFEN 10 MG PO TABS
10.0000 mg | ORAL_TABLET | Freq: Once | ORAL | Status: AC
  Administered 2016-06-25: 10 mg via ORAL
  Filled 2016-06-25: qty 1

## 2016-06-25 MED ORDER — SODIUM CHLORIDE 0.9% FLUSH: 3.0000 mL | INTRAVENOUS | Status: DC | PRN

## 2016-06-25 MED ORDER — SODIUM CHLORIDE 0.9 % IV SOLN
INTRAVENOUS | Status: DC
  Administered 2016-06-26: 15 mL/h via INTRAVENOUS

## 2016-06-25 MED ORDER — SODIUM CHLORIDE 0.9 % IV SOLN
12.0000 mg | Freq: Once | INTRAVENOUS | Status: DC
  Filled 2016-06-25: qty 12

## 2016-06-25 MED ORDER — IOPAMIDOL (ISOVUE-300) INJECTION 61%
INTRAVENOUS | Status: AC
  Administered 2016-06-25: 30 mL
  Filled 2016-06-25: qty 100

## 2016-06-25 MED ORDER — FESOTERODINE FUMARATE ER 8 MG PO TB24
8.0000 mg | ORAL_TABLET | Freq: Once | ORAL | Status: AC
  Administered 2016-06-25: 8 mg via ORAL
  Filled 2016-06-25: qty 1

## 2016-06-25 MED ORDER — LIDOCAINE HCL 1 % IJ SOLN
INTRAMUSCULAR | Status: AC
  Filled 2016-06-25: qty 20

## 2016-06-25 MED ORDER — SODIUM CHLORIDE 0.9 % IV SOLN: 250.0000 mL | INTRAVENOUS | Status: DC | PRN

## 2016-06-25 MED ORDER — SODIUM CHLORIDE 0.9% FLUSH
3.0000 mL | Freq: Two times a day (BID) | INTRAVENOUS | Status: DC
  Administered 2016-06-26: 3 mL via INTRAVENOUS
  Administered 2016-06-26: 11:00:00 via INTRAVENOUS
  Administered 2016-06-27: 3 mL via INTRAVENOUS

## 2016-06-25 MED ORDER — DULOXETINE HCL 60 MG PO CPEP
60.0000 mg | ORAL_CAPSULE | Freq: Every day | ORAL | Status: DC
  Administered 2016-06-25 – 2016-06-27 (×3): 60 mg via ORAL
  Filled 2016-06-25 (×3): qty 1

## 2016-06-25 MED ORDER — FENTANYL CITRATE (PF) 100 MCG/2ML IJ SOLN
INTRAMUSCULAR | Status: AC | PRN
  Administered 2016-06-25: 50 ug via INTRAVENOUS

## 2016-06-25 MED ORDER — INFLUENZA VAC SPLIT QUAD 0.5 ML IM SUSY: 0.5000 mL | PREFILLED_SYRINGE | INTRAMUSCULAR | Status: DC | PRN

## 2016-06-25 MED ORDER — MIDAZOLAM HCL 2 MG/2ML IJ SOLN
INTRAMUSCULAR | Status: AC
  Filled 2016-06-25: qty 2

## 2016-06-25 MED ORDER — ALTEPLASE 50 MG IV SOLR
12.0000 mg | Freq: Once | INTRAVENOUS | Status: DC
  Filled 2016-06-25: qty 12

## 2016-06-25 MED ORDER — FENTANYL CITRATE (PF) 100 MCG/2ML IJ SOLN
INTRAMUSCULAR | Status: AC
  Filled 2016-06-25: qty 2

## 2016-06-25 MED ORDER — HEPARIN (PORCINE) IN NACL 100-0.45 UNIT/ML-% IJ SOLN
1100.0000 [IU]/h | INTRAMUSCULAR | Status: AC
  Filled 2016-06-25: qty 250

## 2016-06-25 MED ORDER — SODIUM CHLORIDE 0.9 % IV SOLN
INTRAVENOUS | Status: DC
  Administered 2016-06-26: 06:00:00 via INTRAVENOUS

## 2016-06-25 MED ORDER — BACLOFEN 20 MG PO TABS
20.0000 mg | ORAL_TABLET | Freq: Every evening | ORAL | Status: DC
  Administered 2016-06-25 – 2016-06-26 (×2): 20 mg via ORAL
  Filled 2016-06-25 (×3): qty 1

## 2016-06-25 MED ORDER — MIDAZOLAM HCL 2 MG/2ML IJ SOLN
INTRAMUSCULAR | Status: AC | PRN
  Administered 2016-06-25: 1 mg via INTRAVENOUS

## 2016-06-25 MED ORDER — PANTOPRAZOLE SODIUM 20 MG PO TBEC
20.0000 mg | DELAYED_RELEASE_TABLET | Freq: Every day | ORAL | Status: DC
  Administered 2016-06-26 – 2016-06-27 (×2): 20 mg via ORAL
  Filled 2016-06-25 (×2): qty 1

## 2016-06-25 MED ORDER — LIDOCAINE HCL 1 % IJ SOLN
INTRAMUSCULAR | Status: AC | PRN
  Administered 2016-06-25: 10 mL

## 2016-06-25 MED ORDER — DIAZEPAM 2 MG PO TABS
2.0000 mg | ORAL_TABLET | Freq: Three times a day (TID) | ORAL | Status: DC | PRN
  Administered 2016-06-25 – 2016-06-26 (×4): 2 mg via ORAL
  Filled 2016-06-25 (×3): qty 1

## 2016-06-25 MED ORDER — HEPARIN (PORCINE) IN NACL 100-0.45 UNIT/ML-% IJ SOLN: 1250.0000 [IU]/h | INTRAMUSCULAR | Status: DC

## 2016-06-25 NOTE — Progress Notes (Signed)
ANTICOAGULATION CONSULT NOTE - Follow Up Consult  Pharmacy Consult for heparin Indication: pulmonary embolus  Labs:  Recent Labs  06/24/16 1712 06/25/16 0236  HGB 14.7  --   HCT 44.8  --   PLT 190  --   HEPARINUNFRC  --  0.64  CREATININE 0.79  --     Assessment/Plan:  55yo female therapeutic on heparin with initial dosing for extensive bilateral PE. Will continue gtt at current rate and confirm stable with additional level.   Wynona Neat, PharmD, BCPS  06/25/2016,3:14 AM

## 2016-06-25 NOTE — Progress Notes (Signed)
Name: Joyce Payne MRN: [id number] DOB: [birth year]    ADMISSION DATE:  06/24/2016 CONSULTATION DATE:  06/25/2016  REFERRING MD :  ED, Wilson Singer  CHIEF COMPLAINT:  Dyspnea  BRIEF PATIENT DESCRIPTION: 55 year old with prior history of Hodgkin's lymphoma and DVT/PE in 2011 admitted with submassive pulmonary embolism  SIGNIFICANT EVENTS    STUDIES:  CT angiogram 9/13 extensive bilateral acute pulmonary emboli, distending the right pulmonary artery, RV/LV ratio 1.9 Head CT  9/13 >>   HISTORY OF PRESENT ILLNESS:  55 year old obese ex-smoker presented with an acute onset dyspnea for 2 days and chest heaviness. She was transferred by EMS from [facility] PCP to the ED, d-dimer was noted positive, CT angiogram of her chest confirmed extensive bilateral pulmonary emboli. She was hypotensive on arrival with blood pressure documented as low as 80s, she has now responded to 2 L of fluid with blood pressure in the 90s. She still complains of chest heaviness but breathing is somewhat improved on IV heparin  She reports 2-3 episodes of sinusitis over the last 8 weeks or which she has gotten 2 rounds of antibiotics. She reports leg swelling for about a week when she was at the beach with her partner Wykisha  She reports prior history of DVT/PE in 2011 after finishing chemotherapy for Hodgkin's lymphoma. She took Coumadin for about 6 months and did not have any problems. She reports mild headache for the last 3 weeks-which she describes as a migraine She has chronic back pain after back surgery and takes muscle relaxants and pain medications for this  She lost 50 pounds over the last 6 months on a diet. She had gastric bypass surgery 1999    SUBJECTIVE:  No issues overnight. (-) cp, SOB. Comfortable.  BP soft [id number] systolic (baseline is [id number])  VITAL SIGNS: Temp:  [97.3 F (36.3 C)-98.2 F (36.8 C)] 97.3 F (36.3 C) (09/14 0417) Pulse Rate:  [82-116] 88 (09/14 0600) Resp:  [13-26] 16 (09/14  0600) BP: (70-110)/(36-84) 97/64 (09/14 0600) SpO2:  [92 %-99 %] 97 % (09/14 0600) Weight:  [103.9 kg (229 lb)-106.4 kg (234 lb 9.1 oz)] 106.4 kg (234 lb 9.1 oz) (09/14 0500)  PHYSICAL EXAMINATION: Gen. Pleasant, obese, in no distress, normal affect ENT - no lesions, no post nasal drip, class 2-3 airway Neck: No JVD, no thyromegaly, no carotid bruits Lungs: no use of accessory muscles, no dullness to percussion, decreased without rales or rhonchi  Cardiovascular: Rhythm regular, heart sounds  normal, no murmurs or gallops, no peripheral edema Abdomen: soft and non-tender, no hepatosplenomegaly, BS normal. Musculoskeletal: No deformities, no cyanosis or clubbing Neuro:  alert, non focal, no tremors    Recent Labs Lab 06/24/16 1712 06/25/16 0608  NA 140 144  K 4.0 3.7  CL 109 115*  CO2 22 24  BUN 26* 14  CREATININE 0.79 0.57  GLUCOSE 121* 127*    Recent Labs Lab 06/24/16 1712 06/25/16 0608  HGB 14.7 12.1  HCT 44.8 37.5  WBC 10.5 7.9  PLT 190 139*   Dg Chest 2 View  Result Date: 06/24/2016 CLINICAL DATA:  Chest pain, shortness of breath over the last 2 days, dizziness EXAM: CHEST  2 VIEW COMPARISON:  Chest x-ray of 06/24/2016 FINDINGS: No active infiltrate or effusion is seen. Mediastinal and hilar contours are unremarkable. The heart is within upper limits of normal. Healed right rib fractures are again noted. IMPRESSION: No active cardiopulmonary disease. Electronically Signed   By: Ivar Drape M.D.   On: 06/24/2016  16:49   Ct Head Wo Contrast  Result Date: 06/24/2016 CLINICAL DATA:  Intense headaches. EXAM: CT HEAD WITHOUT CONTRAST TECHNIQUE: Contiguous axial images were obtained from the base of the skull through the vertex without intravenous contrast. COMPARISON:  None. FINDINGS: Brain: No mass lesion, intraparenchymal hemorrhage or extra-axial collection. No evidence of acute cortical infarct. Brain parenchyma and CSF-containing spaces are normal for age. Vascular: No  hyperdense vessel or atherosclerotic calcification. Skull: Normal visualized skull base, calvarium and extracranial soft tissues. Sinuses/Orbits: No sinus fluid levels or advanced mucosal thickening. No mastoid effusion. Normal orbits. IMPRESSION: Normal head CT. Electronically Signed   By: Ulyses Jarred M.D.   On: 06/24/2016 23:25   Ct Angio Chest Pe W And/or Wo Contrast  Addendum Date: 06/24/2016   ADDENDUM REPORT: 06/24/2016 22:09 ADDENDUM: Positive for acute PE with CT evidence of right heart strain (RV/LV Ratio = 1.95) consistent with at least submassive (intermediate risk) PE. The presence of right heart strain has been associated with an increased risk of morbidity and mortality. Please activate Code PE by paging [phone]. These results were discuss at the time of interpretation on 06/24/2016 at 9:50 Pm with Dr. Elsworth Soho, who verbally acknowledged these results. Electronically Signed   By: Kerby Moors M.D.   On: 06/24/2016 22:09   Result Date: 06/24/2016 CLINICAL DATA:  Chest tightness, dyspnea history of DVT and Hodgkin's lymphoma EXAM: CT ANGIOGRAPHY CHEST WITH CONTRAST TECHNIQUE: Multidetector CT imaging of the chest was performed using the standard protocol during bolus administration of intravenous contrast. Multiplanar CT image reconstructions and MIPs were obtained to evaluate the vascular anatomy. CONTRAST:  70 cc Isovue 370 COMPARISON:  Radiograph 06/24/2016, CT thorax 02/06/2013 FINDINGS: Cardiovascular: There are extensive filling defect identified within the distal right main pulmonary artery, consistent with acute thrombus. Filling defects extend into the segmental branches of the right upper and middle lobes and segmental and subsegmental branches of the right lower lobe. Additional linear thrombus is visualized within the left main pulmonary artery with thrombus extending into segmental branches of left upper lobe and segmental and subsegmental branches of the left lower lobe. Right  pulmonary artery is expanded by the thrombus. Right atrium and right ventricle do not appear enlarged. No pericardial effusion. Thoracic aorta is non aneurysmal. Trace fluid is present in the superior pericardial recess. Mediastinum/Nodes: No significant axillary adenopathy. No pathologically enlarged mediastinal lymph nodes are visualized. Trachea and mainstem bronchi are within normal limits. Lungs/Pleura: No significant pleural effusion. No pulmonary parenchymal nodules. Patchy deep dependent atelectasis. Hazy posterior lower lobe attenuation likely reflects atelectasis. No focal consolidations. Upper Abdomen: Upper abdominal images demonstrate grossly unremarkable appearance of the liver, spleen, and partially visualized adrenal glands. Musculoskeletal: No acute osseous abnormality. No suspicious bone lesions are evident. Multiple old appearing right-sided rib fractures. Review of the MIP images confirms the above findings. IMPRESSION: Extensive bilateral acute pulmonary emboli, right greater than left as described above. Hazy attenuation of posterior lower lobes, likely reflecting atelectasis. Critical Value/emergent results were called by telephone at the time of interpretation on 06/24/2016 at 748 pm to Dr. Virgel Manifold , who verbally acknowledged these results. Electronically Signed: By: Donavan Foil M.D. On: 06/24/2016 19:57    ASSESSMENT / PLAN:  1. Submassive pulmonary embolism with evidence of right heart strain on CT angiogram. Initial hypotension seems to have responded to fluids. She is only requiring 2 L oxygen. Note positive troponins. 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 First was several yrs ago when she was getting chemo for  Hodgkins.  Was on coumadin for 1 yr roughly.  Her sx this time started when she drove to the beach 1-2 weeks ago. 5 hr drive. Had R leg and foot edema. Became acutely ill with SOB and near syncope 2 days ago.   I extensively d/w pt re: risks and benefits of Catheter directed lytic  therapy.  She has submassive PE.  She has agreed to proceding with EKOS therapy and she fully understands the risks and benefits of the procedure. I have spoke with IR and mentioned that pt consented to EKOS.   Much as this episode seems provoked by history, I recommend lifelong [place] 2/2 it being the second VTE, h/o Cancer.   Cont heparin drip.   F/u 2DEcho. Trend troponin.   2. Chronic back pain- would continue Norco, Lyrica for neuropathy and muscle relaxants 3. Depression - cont cymbalta 4. We'll keep her nothing by mouth except for liquids until procedure done   J. Shirl Harris, MD 06/25/2016, 9:18 AM [facility] Pager [phone] After 3 pm or if no answer, call [phone]    06/25/2016    06/25/2016, 8:34 AM

## 2016-06-25 NOTE — Sedation Documentation (Signed)
Patient is resting comfortably. 

## 2016-06-25 NOTE — Sedation Documentation (Signed)
Vital signs stable. 

## 2016-06-25 NOTE — Progress Notes (Addendum)
[place] for heparin Indication: pulmonary embolus  Allergies  Allergen Reactions  . Povidone-Iodine Rash    Patient Measurements: Height: 5\' 7"  (170.2 cm) Weight: 234 lb 9.1 oz (106.4 kg) IBW/kg (Calculated) : 61.6 Heparin Dosing Weight: 85 kg   Vital Signs: Temp: 97.7 F (36.5 C) (09/14 1645) Temp Source: Oral (09/14 1645) BP: 98/57 (09/14 1700) Pulse Rate: 86 (09/14 1700)  Labs:  Recent Labs  06/24/16 1712 06/25/16 0236 06/25/16 0608 06/25/16 1500 06/25/16 1600  HGB 14.7  --  12.1 12.1  --   HCT 44.8  --  37.5 38.5  --   PLT 190  --  139* 146*  --   HEPARINUNFRC  --  0.64 0.74*  --  1.68*  CREATININE 0.79  --  0.57  --   --     Estimated Creatinine Clearance: 99.7 mL/min (by C-G formula based on SCr of 0.57 mg/dL).   Assessment: 55 yo female admitted with dyspnea. Pharmacy consulted to dose heparin for pulmonary embolism. D-dimer elevated at 10.59 and CT ordered. Patient with history of DVT/PE. No current oral AC PTA at this time, although per notes was on coumadin previously - which ended in 2012. CBC within normal limits, platelets normal at 190. No signs or symptoms of bleeding noted.   Now with TPA x 2 via EKOS system, HL elevated at 1.68  Goal of Therapy:  Heparin level 0.3-0.7 units/ml Monitor platelets by anticoagulation protocol: Yes     Plan:  Heparin off x 1 hour Heparin drip at 900 units / hr after held x 1 hour Heparin level Q 6 hours while on EKOS TPA (next level at 2430 am)  Thank you Anette Guarneri, PharmD [phone] 06/25/16 5:37 PM

## 2016-06-25 NOTE — Progress Notes (Signed)
Name: Joyce Payne MRN: [id number] DOB: 06/21/1961    ADMISSION DATE:  06/24/2016 CONSULTATION DATE:  06/25/2016  REFERRING MD :  ED, Wilson Singer  CHIEF COMPLAINT:  Dyspnea  BRIEF PATIENT DESCRIPTION: 55 year old with prior history of Hodgkin's lymphoma and DVT/PE in 2011 admitted with submassive pulmonary embolism  SIGNIFICANT EVENTS    STUDIES:  CT angiogram 9/13 extensive bilateral acute pulmonary emboli, distending the right pulmonary artery, RV/LV ratio 1.9 Head CT  9/13 >> negative   SUBJECTIVE:   VITAL SIGNS: Temp:  [97.3 F (36.3 C)-98.2 F (36.8 C)] 97.3 F (36.3 C) (09/14 0417) Pulse Rate:  [82-116] 88 (09/14 0600) Resp:  [13-26] 16 (09/14 0600) BP: (70-110)/(36-84) 97/64 (09/14 0600) SpO2:  [92 %-99 %] 97 % (09/14 0600) Weight:  [229 lb (103.9 kg)-234 lb 9.1 oz (106.4 kg)] 234 lb 9.1 oz (106.4 kg) (09/14 0500)  PHYSICAL EXAMINATION: Gen. Pleasant, obese, in no distress, normal affect ENT NCAT, no JVD Lungs: no use of accessory muscles, no dullness to percussion, decreased without rales or rhonchi  Cardiovascular: Rhythm regular, heart sounds  normal, no murmurs or gallops, no peripheral edema Abdomen: soft and non-tender, no hepatosplenomegaly, BS normal. Musculoskeletal: No deformities, no cyanosis or clubbing Neuro:  alert, non focal, no tremors    Recent Labs Lab 06/24/16 1712 06/25/16 0608  NA 140 144  K 4.0 3.7  CL 109 115*  CO2 22 24  BUN 26* 14  CREATININE 0.79 0.57  GLUCOSE 121* 127*    Recent Labs Lab 06/24/16 1712 06/25/16 0608  HGB 14.7 12.1  HCT 44.8 37.5  WBC 10.5 7.9  PLT 190 139*   Dg Chest 2 View  Result Date: 06/24/2016 CLINICAL DATA:  Chest pain, shortness of breath over the last 2 days, dizziness EXAM: CHEST  2 VIEW COMPARISON:  Chest x-ray of 06/24/2016 FINDINGS: No active infiltrate or effusion is seen. Mediastinal and hilar contours are unremarkable. The heart is within upper limits of normal. Healed right rib fractures  are again noted. IMPRESSION: No active cardiopulmonary disease. Electronically Signed   By: Ivar Drape M.D.   On: 06/24/2016 16:49   Ct Head Wo Contrast  Result Date: 06/24/2016 CLINICAL DATA:  Intense headaches. EXAM: CT HEAD WITHOUT CONTRAST TECHNIQUE: Contiguous axial images were obtained from the base of the skull through the vertex without intravenous contrast. COMPARISON:  None. FINDINGS: Brain: No mass lesion, intraparenchymal hemorrhage or extra-axial collection. No evidence of acute cortical infarct. Brain parenchyma and CSF-containing spaces are normal for age. Vascular: No hyperdense vessel or atherosclerotic calcification. Skull: Normal visualized skull base, calvarium and extracranial soft tissues. Sinuses/Orbits: No sinus fluid levels or advanced mucosal thickening. No mastoid effusion. Normal orbits. IMPRESSION: Normal head CT. Electronically Signed   By: Ulyses Jarred M.D.   On: 06/24/2016 23:25   Ct Angio Chest Pe W And/or Wo Contrast  Addendum Date: 06/24/2016   ADDENDUM REPORT: 06/24/2016 22:09 ADDENDUM: Positive for acute PE with CT evidence of right heart strain (RV/LV Ratio = 1.95) consistent with at least submassive (intermediate risk) PE. The presence of right heart strain has been associated with an increased risk of morbidity and mortality. Please activate Code PE by paging [phone]. These results were discuss at the time of interpretation on 06/24/2016 at 9:50 Pm with Dr. Elsworth Soho, who verbally acknowledged these results. Electronically Signed   By: Kerby Moors M.D.   On: 06/24/2016 22:09   Result Date: 06/24/2016 CLINICAL DATA:  Chest tightness, dyspnea history of DVT and Hodgkin's  lymphoma EXAM: CT ANGIOGRAPHY CHEST WITH CONTRAST TECHNIQUE: Multidetector CT imaging of the chest was performed using the standard protocol during bolus administration of intravenous contrast. Multiplanar CT image reconstructions and MIPs were obtained to evaluate the vascular anatomy. CONTRAST:   70 cc Isovue 370 COMPARISON:  Radiograph 06/24/2016, CT thorax 02/06/2013 FINDINGS: Cardiovascular: There are extensive filling defect identified within the distal right main pulmonary artery, consistent with acute thrombus. Filling defects extend into the segmental branches of the right upper and middle lobes and segmental and subsegmental branches of the right lower lobe. Additional linear thrombus is visualized within the left main pulmonary artery with thrombus extending into segmental branches of left upper lobe and segmental and subsegmental branches of the left lower lobe. Right pulmonary artery is expanded by the thrombus. Right atrium and right ventricle do not appear enlarged. No pericardial effusion. Thoracic aorta is non aneurysmal. Trace fluid is present in the superior pericardial recess. Mediastinum/Nodes: No significant axillary adenopathy. No pathologically enlarged mediastinal lymph nodes are visualized. Trachea and mainstem bronchi are within normal limits. Lungs/Pleura: No significant pleural effusion. No pulmonary parenchymal nodules. Patchy deep dependent atelectasis. Hazy posterior lower lobe attenuation likely reflects atelectasis. No focal consolidations. Upper Abdomen: Upper abdominal images demonstrate grossly unremarkable appearance of the liver, spleen, and partially visualized adrenal glands. Musculoskeletal: No acute osseous abnormality. No suspicious bone lesions are evident. Multiple old appearing right-sided rib fractures. Review of the MIP images confirms the above findings. IMPRESSION: Extensive bilateral acute pulmonary emboli, right greater than left as described above. Hazy attenuation of posterior lower lobes, likely reflecting atelectasis. Critical Value/emergent results were called by telephone at the time of interpretation on 06/24/2016 at 748 pm to Dr. Virgel Manifold , who verbally acknowledged these results. Electronically Signed: By: Donavan Foil M.D. On: 06/24/2016 19:57     ASSESSMENT / PLAN:  Submassive pulmonary embolism acute deep vein thrombosis involving the distal femoral and the poplteal veins of the right lower extremity. with evidence of right heart strain on CT angiogram. -->s/p EKOS 9/14: PAP 55/33 (42) to 44/29 (35) on 9/15. Catheters now removed.  Plan Start NOAC mobilize Transfer to medsurg Walking oximetry F/u w/ Pulmonary 6-8 weeks F/u w/ her hematologist at [place] ACNP-BC [place] Pager # [phone] OR # [phone] if no answer   ATTENDING NOTE / ATTESTATION NOTE :   I have discussed the case with the resident/APP  Marni Griffon  I agree with the resident/APP's  history, physical examination, assessment, and plans.    I have edited the above note and modified it according to our agreed history, physical examination, assessment and plan.   Briefly, pt admitted after near syncope and recent travel with RLE swelling.  Found to have significant clot burden, submassive PE. Had EKOS on 9/14 and tolerated well. No issues overnight.  Needs NOAC indefinitely. Suggest rpt chest CTA in 3-4 mos as f/u to make sure no chronic PE or Pulm HTN.  Has pain and psych meds which can lower BP > may need to adjust if pts BP is low 2/2 recent PE. Transfer to med surg.  TH as primary on 9/16, PCCM off unless with issues.    Family :Family updated at length today.  Spoke to pt and wife at bedside.    Monica Becton, MD 06/26/2016, 1:06 PM [facility] Pager [phone] After 3 pm or if no answer, call [phone]

## 2016-06-25 NOTE — Sedation Documentation (Signed)
Report given to Joyce Payne [id number]

## 2016-06-25 NOTE — Progress Notes (Addendum)
VASCULAR LAB PRELIMINARY  PRELIMINARY  PRELIMINARY  PRELIMINARY  Bilateral lower extremity venous duplex completed.    Preliminary report:  There is subacute DVT noted in the distal right femoral vein and popliteal vein.  All other veins appear patent.  Gave report to Dr. Corrie Dandy.  Stavroula Rohde, RVT 06/25/2016, 10:09 AM

## 2016-06-25 NOTE — Sedation Documentation (Signed)
Pt is resting at this time

## 2016-06-25 NOTE — Sedation Documentation (Signed)
EKOs machine attached to right femoral vein

## 2016-06-25 NOTE — Progress Notes (Signed)
[facility] Progress Note Patient Name: Joyce Payne DOB: May 26, 1961 MRN: [id number]   Date of Service  06/25/2016  HPI/Events of Note  Patient requesting baclofen, toviaz, part of her home regiment  eICU Interventions  Orders given for one time dose of each tonight.      Intervention Category Intermediate Interventions: Other:  Hicks Feick 06/25/2016, 12:26 AM

## 2016-06-25 NOTE — Sedation Documentation (Signed)
Vital signs stable. No complaints from pt at this time.  

## 2016-06-25 NOTE — Progress Notes (Signed)
ANTICOAGULATION CONSULT NOTE - Follow Up Consult  Pharmacy Consult for Heparin Indication: DVT  Allergies  Allergen Reactions  . Povidone-Iodine Rash    Patient Measurements: Height: 5\' 7"  (170.2 cm) Weight: 234 lb 9.1 oz (106.4 kg) IBW/kg (Calculated) : 61.6 Heparin Dosing Weight: 85.8  Vital Signs: Temp: 97.3 F (36.3 C) (09/14 1134) Temp Source: Oral (09/14 1134) BP: 94/52 (09/14 1200) Pulse Rate: 87 (09/14 1200)  Labs:  Recent Labs  06/24/16 1712 06/25/16 0236 06/25/16 0608  HGB 14.7  --  12.1  HCT 44.8  --  37.5  PLT 190  --  139*  HEPARINUNFRC  --  0.64 0.74*  CREATININE 0.79  --  0.57    Estimated Creatinine Clearance: 99.7 mL/min (by C-G formula based on SCr of 0.57 mg/dL).   Assessment: Joyce Payne being anticoagulated for PE and subacute DVT. D-dimer and troponin elevated.  Going for EKOS thrombolysis today. Confirmatory supratherapeutic. H/H stable, PLTC dropped. No s/sx of bleeding.   Goal of Therapy:  Heparin level 0.3-0.7 units/ml Monitor platelets by anticoagulation protocol: Yes   Plan:  Decrease Heparin to 1250 units/hr 6 hour HL Daily HL and CBC Monitor s/sx of bleeding and anticoagulation plans post-op  Dierdre Harness, Cain Sieve, PharmD Clinical Pharmacy Resident [phone] (Pager) 06/25/2016 1:25 PM

## 2016-06-25 NOTE — Consult Note (Signed)
Chief Complaint: Patient was seen in consultation today for pulmonary embolus thrombolysis Chief Complaint  Patient presents with  . Shortness of Breath  . Chest Pain   at the request of Dr Domingo Mend  Referring Physician(s): Dr Janene Madeira  Supervising Physician: Sandi Mariscal  Patient Status: Inpatient  History of Present Illness: Joyce Payne is a 55 y.o. female   Pt has been suffering worsening shortness of breath x 3-4 days Worsened over last 24 hrs Cannot even move without SOB; and chest heaviness Denies leg pain---although does admit to some B swelling noted while at beach 1 week ago--resolved once returned home  CT 9/13: Positive for acute PE with CT evidence of right heart strain (RV/LV Ratio = 1.95) consistent with at least submassive (intermediate risk) PE. The presence of right heart strain has been associated with an increased risk of morbidity and mortality  BLE doppler being performed now VASCULAR LAB PRELIMINARY  PRELIMINARY  PRELIMINARY  PRELIMINARY Bilateral lower extremity venous duplex completed.   Preliminary report:  There is subacute DVT noted in the distal right femoral vein and popliteal vein.  All other veins appear patent.  Echo not performed yet D Dimer + Troponin + EKG 9/13: Sinus tachycardia IRBBB and LPFB Low voltage with right axis deviation Borderline prolonged QT interval  Pt has hx Hodgkin's Lymphoma and  PE/RLE DVT 2012 Was treated with chemo and coumadin x 1 year. No problems since then  Request for saddle PE EKOS thrombolysis in IR per CCM Dr Annamaria Boots and Dr Pascal Lux have reviewed imaging and approve procedure Now scheduled for same   Past Medical History:  Diagnosis Date  . Anxiety   . Arthritis of knee   . Arthritis of lumbar spine   . Chronic back pain   . Complication of anesthesia    had woken up during surgery, high tolerance to pain meds and anesthesia  . Depression   . Depression with anxiety  02/16/2012  . GERD (gastroesophageal reflux disease)   . H/O gastric bypass 02/15/2012   6/88  . History of DVT (deep vein thrombosis) NOV 2011- RIGHT LOWER EXTREMITY  . 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 Hodgkin's lymphoma, nodular sclerosis ([place]) 02/15/2012   IIA NS Hodgkin's dx 6/11 Rx 6 ABVD [place]  . Hx of pulmonary embolus 02/15/2012   Occurred during chemo for Hodgkin's dis 2011  . Impingement syndrome of left shoulder   . Neuromuscular disorder ([place])   . Recurrent binge eating 02/16/2012   Reason for topomax  . S/P gastric bypass   . Sinus infection     Past Surgical History:  Procedure Laterality Date  . GASTRIC BYPASS  1999  . [place] SURGERY  2009  . ORIF TOE FRACTURE Left 06/27/2015   Procedure: OPEN REDUCTION INTERNAL FIXATION (ORIF) LEFT FOURTH AND FIFTH METATARSAL (TOE) FRACTURE NONUNION;  Surgeon: Wylene Simmer, MD;  Location: [facility];  Service: Orthopedics;  Laterality: Left;  . PATELLA FRACTURE SURGERY    . PORT-A-CATH INSERTION  2011  . SHOULDER CLOSED REDUCTION  01/18/2012   Procedure: CLOSED MANIPULATION SHOULDER;  Surgeon: Johnn Hai, MD;  Location: [facility];  Service: Orthopedics;  Laterality: Left;  . TONSILECTOMY, ADENOIDECTOMY, BILATERAL MYRINGOTOMY  AND TUBES  1985    Allergies: Povidone-iodine  Medications: Prior to Admission medications   Medication Sig Start Date End Date Taking? Authorizing Provider  baclofen (LIORESAL) 10 MG tablet Take 10-20 mg by mouth 3 (three) times daily.   Yes Historical Provider, MD  celecoxib (CELEBREX) 200 MG capsule Take 200 mg by mouth 2 (two) times daily.   Yes Historical Provider, MD  Cyanocobalamin (B-12 PO) Take 1 tablet by mouth every 3 (three) days.   Yes Historical Provider, MD    diazepam (VALIUM) 2 MG tablet Take 2 mg by mouth 3 (three) times daily.    Yes Historical Provider, MD  DULoxetine (CYMBALTA) 60 MG capsule Take 60 mg by mouth daily.   Yes Historical Provider, MD  fesoterodine (TOVIAZ) 8 MG TB24 tablet Take 8 mg by mouth daily.   Yes Historical Provider, MD  fexofenadine (ALLEGRA) 180 MG tablet Take 180 mg by mouth daily.   Yes Historical Provider, MD  fluticasone (FLONASE) 50 MCG/ACT nasal spray Place 2 sprays into both nostrils daily.   Yes Historical Provider, MD  HYDROcodone-acetaminophen (NORCO) 10-325 MG tablet Take 1 tablet by mouth every 6 (six) hours as needed for moderate pain.    Yes Historical Provider, MD  levofloxacin (LEVAQUIN) 500 MG tablet Take 500 mg by mouth daily. STARTED 06/22/16, FOR 10 DAYS ENDING 07/01/16   Yes Historical Provider, MD  lisdexamfetamine (VYVANSE) 70 MG capsule Take 70 mg by mouth daily.   Yes Historical Provider, MD  Misc Natural Products (COLON CLEANSE) CAPS Take 7 capsules by mouth daily.   Yes Historical Provider, MD  Omeprazole-Sodium Bicarbonate (ZEGERID) 20-1100 MG CAPS capsule Take 1 capsule by mouth daily before breakfast.   Yes Historical Provider, MD  pregabalin (LYRICA) 150 MG capsule Take 150 mg by mouth 3 (three) times daily.   Yes Historical Provider, MD  tiZANidine (ZANAFLEX) 4 MG tablet Take 4 mg by mouth every 6 (six) hours as needed for muscle spasms.   Yes Historical Provider, MD  traZODone (DESYREL) 150 MG tablet Take 150 mg by mouth at bedtime.   Yes Historical Provider, MD  oxyCODONE (ROXICODONE) 5 MG immediate release tablet Take 1-2 tablets (5-10 mg total) by mouth every 4 (four) hours as needed for moderate pain or severe pain. Patient not taking: Reported on 06/24/2016 06/27/15   Wylene Simmer, MD     Family History  Problem Relation Age of Onset  . Hypertension Mother   . Hyperlipidemia Mother   . Cancer Father     colon can  . Hypertension Other   . Cancer Other     colon, 1st degree  relative<60    Social History   Social History  . Marital status: Married    Spouse name: N/A  . Number of children: N/A  . Years of education: N/A   Occupational History  . Quarry manager Tobacco   Social History Main Topics  . Smoking status: Former Smoker    Years: 10.00    Types: Cigarettes    Quit date: 10/22/2006  . Smokeless tobacco: Never Used  . Alcohol use No  . Drug use: No  . Sexual activity: Not Currently   Other Topics Concern  . None   Social History Narrative   Regular exercise- NO    Review of Systems: A 12 point ROS discussed and pertinent positives are indicated in the HPI above.  All other systems are negative.  Review of Systems  Constitutional: Positive for appetite change and  fatigue. Negative for fever.  HENT: Negative for nosebleeds.   Eyes: Negative for visual disturbance.  Respiratory: Positive for shortness of breath.   Cardiovascular: Positive for leg swelling.       Chest heaviness   Gastrointestinal: Negative for abdominal pain.  Genitourinary: Negative for hematuria.  Musculoskeletal: Negative for back pain.  Skin: Negative for color change.  Neurological: Positive for weakness.  Psychiatric/Behavioral: Negative for behavioral problems and confusion.    Vital Signs: BP (!) 83/52   Pulse 93   Temp 97.3 F (36.3 C) (Oral)   Resp 19   Ht 5\' 7"  (1.702 m)   Wt 234 lb 9.1 oz (106.4 kg)   LMP 10/25/2012   SpO2 95%   BMI 36.74 kg/m   Physical Exam  Constitutional: She is oriented to person, place, and time.  Cardiovascular: Normal rate, regular rhythm and normal heart sounds.   Pulmonary/Chest: Effort normal and breath sounds normal. No respiratory distress. She has no wheezes.  Abdominal: Soft. Bowel sounds are normal. There is no tenderness.  Musculoskeletal: Normal range of motion. She exhibits no edema or tenderness.  Neurological: She is alert and oriented to person, place, and time.  Skin: Skin is warm and  dry.  Psychiatric: She has a normal mood and affect. Her behavior is normal. Judgment and thought content normal.  Nursing note and vitals reviewed.   Mallampati Score:  MD Evaluation Airway: WNL Heart: WNL Abdomen: WNL Chest/ Lungs: WNL ASA  Classification: 3 Mallampati/Airway Score: One  Imaging: Dg Chest 2 View  Result Date: 06/24/2016 CLINICAL DATA:  Chest pain, shortness of breath over the last 2 days, dizziness EXAM: CHEST  2 VIEW COMPARISON:  Chest x-ray of 06/24/2016 FINDINGS: No active infiltrate or effusion is seen. Mediastinal and hilar contours are unremarkable. The heart is within upper limits of normal. Healed right rib fractures are again noted. IMPRESSION: No active cardiopulmonary disease. Electronically Signed   By: Ivar Drape M.D.   On: 06/24/2016 16:49   Ct Head Wo Contrast  Result Date: 06/24/2016 CLINICAL DATA:  Intense headaches. EXAM: CT HEAD WITHOUT CONTRAST TECHNIQUE: Contiguous axial images were obtained from the base of the skull through the vertex without intravenous contrast. COMPARISON:  None. FINDINGS: Brain: No mass lesion, intraparenchymal hemorrhage or extra-axial collection. No evidence of acute cortical infarct. Brain parenchyma and CSF-containing spaces are normal for age. Vascular: No hyperdense vessel or atherosclerotic calcification. Skull: Normal visualized skull base, calvarium and extracranial soft tissues. Sinuses/Orbits: No sinus fluid levels or advanced mucosal thickening. No mastoid effusion. Normal orbits. IMPRESSION: Normal head CT. Electronically Signed   By: Ulyses Jarred M.D.   On: 06/24/2016 23:25   Ct Angio Chest Pe W And/or Wo Contrast  Addendum Date: 06/24/2016   ADDENDUM REPORT: 06/24/2016 22:09 ADDENDUM: Positive for acute PE with CT evidence of right heart strain (RV/LV Ratio = 1.95) consistent with at least submassive (intermediate risk) PE. The presence of right heart strain has been associated with an increased risk of  morbidity and mortality. Please activate Code PE by paging [phone]. These results were discuss at the time of interpretation on 06/24/2016 at 9:50 Pm with Dr. Elsworth Soho, who verbally acknowledged these results. Electronically Signed   By: Kerby Moors M.D.   On: 06/24/2016 22:09   Result Date: 06/24/2016 CLINICAL DATA:  Chest tightness, dyspnea history of DVT and Hodgkin's lymphoma EXAM: CT ANGIOGRAPHY CHEST WITH CONTRAST TECHNIQUE: Multidetector CT imaging of the chest was performed using the standard protocol during bolus administration  of intravenous contrast. Multiplanar CT image reconstructions and MIPs were obtained to evaluate the vascular anatomy. CONTRAST:  70 cc Isovue 370 COMPARISON:  Radiograph 06/24/2016, CT thorax 02/06/2013 FINDINGS: Cardiovascular: There are extensive filling defect identified within the distal right main pulmonary artery, consistent with acute thrombus. Filling defects extend into the segmental branches of the right upper and middle lobes and segmental and subsegmental branches of the right lower lobe. Additional linear thrombus is visualized within the left main pulmonary artery with thrombus extending into segmental branches of left upper lobe and segmental and subsegmental branches of the left lower lobe. Right pulmonary artery is expanded by the thrombus. Right atrium and right ventricle do not appear enlarged. No pericardial effusion. Thoracic aorta is non aneurysmal. Trace fluid is present in the superior pericardial recess. Mediastinum/Nodes: No significant axillary adenopathy. No pathologically enlarged mediastinal lymph nodes are visualized. Trachea and mainstem bronchi are within normal limits. Lungs/Pleura: No significant pleural effusion. No pulmonary parenchymal nodules. Patchy deep dependent atelectasis. Hazy posterior lower lobe attenuation likely reflects atelectasis. No focal consolidations. Upper Abdomen: Upper abdominal images demonstrate grossly unremarkable  appearance of the liver, spleen, and partially visualized adrenal glands. Musculoskeletal: No acute osseous abnormality. No suspicious bone lesions are evident. Multiple old appearing right-sided rib fractures. Review of the MIP images confirms the above findings. IMPRESSION: Extensive bilateral acute pulmonary emboli, right greater than left as described above. Hazy attenuation of posterior lower lobes, likely reflecting atelectasis. Critical Value/emergent results were called by telephone at the time of interpretation on 06/24/2016 at 748 pm to Dr. Virgel Manifold , who verbally acknowledged these results. Electronically Signed: By: Donavan Foil M.D. On: 06/24/2016 19:57    Labs:  CBC:  Recent Labs  06/24/16 1712 06/25/16 0608  WBC 10.5 7.9  HGB 14.7 12.1  HCT 44.8 37.5  PLT 190 139*    COAGS: No results for input(s): INR, APTT in the last 8760 hours.  BMP:  Recent Labs  06/24/16 1712 06/25/16 0608  NA 140 144  K 4.0 3.7  CL 109 115*  CO2 22 24  GLUCOSE 121* 127*  BUN 26* 14  CALCIUM 9.1 8.2*  CREATININE 0.79 0.57  GFRNONAA >60 >60  GFRAA >60 >60    LIVER FUNCTION TESTS: No results for input(s): BILITOT, AST, ALT, ALKPHOS, PROT, ALBUMIN in the last 8760 hours.  TUMOR MARKERS: No results for input(s): AFPTM, CEA, CA199, CHROMGRNA in the last 8760 hours.  Assessment and Plan:  Saddle Pulmonary Embolus; + Rt heart strain + Shortness of breath and chest heaviness Scheduled for EKOS Thrombolysis in IR today Risks and Benefits discussed with the patient including, but not limited to bleeding, possible life threatening bleeding and need for blood product transfusion, vascular injury, stroke, contrast induced renal failure,  and infection. All of the patient's questions were answered, patient is agreeable to proceed. Consent signed and in chart.  Thank you for this interesting consult.  I greatly enjoyed meeting Joyce Payne and look forward to participating in their  care.  A copy of this report was sent to the requesting provider on this date.  Electronically Signed: Charnette Younkin A 06/25/2016, 10:03 AM   I spent a total of 40 Minutes    in face to face in clinical consultation, greater than 50% of which was counseling/coordinating care for PE/ Ekos Thormbolysis

## 2016-06-25 NOTE — Sedation Documentation (Signed)
Main PAP- 55/33 (42)

## 2016-06-25 NOTE — Sedation Documentation (Signed)
Measuring PA pressures at this time. Pt vitals stable

## 2016-06-25 NOTE — Sedation Documentation (Signed)
Spoke with pharmacy, verified TPA orders. Will be ready for pick up in 15 mins.

## 2016-06-25 NOTE — Progress Notes (Signed)
   [facility] PCCM  Pt's 2Decho showed moderate RV dysfxn. RVSP 47, hypokinesis in RV alteral free wall. Significant clot in RLE BP 80 systolic, HR [id number] On heparin drip.  Plan d/w pt, her wife Joyce Payne, and IR Dr. Pascal Lux. Plan to procede with EKOS.  Extensively d/w pt and wife re: potential side effects and Cx of catheter directed lysis and they are aware and they agree with proceding with EKOS.  IR aware.   Monica Becton, MD 06/25/2016, 1:36 PM [facility] Pager [phone] After 3 pm or if no answer, call [phone]

## 2016-06-25 NOTE — Sedation Documentation (Addendum)
Pt complains of severe back pain in supine position. MD paged. Vitals stable at this time.

## 2016-06-25 NOTE — Progress Notes (Signed)
  Echocardiogram 2D Echocardiogram has been performed.  Donata Clay 06/25/2016, 11:16 AM

## 2016-06-26 ENCOUNTER — Inpatient Hospital Stay (HOSPITAL_COMMUNITY): Payer: BC Managed Care – PPO

## 2016-06-26 ENCOUNTER — Encounter (HOSPITAL_COMMUNITY): Payer: Self-pay

## 2016-06-26 DIAGNOSIS — I2692 Saddle embolus of pulmonary artery without acute cor pulmonale: Principal | ICD-10-CM

## 2016-06-26 HISTORY — PX: IR GENERIC HISTORICAL: IMG1180011

## 2016-06-26 LAB — CBC
HCT: 36.5 % (ref 36.0–46.0)
HCT: 39.4 % (ref 36.0–46.0)
Hemoglobin: 11.3 g/dL — ABNORMAL LOW (ref 12.0–15.0)
Hemoglobin: 12.3 g/dL (ref 12.0–15.0)
MCH: 31.9 pg (ref 26.0–34.0)
MCH: 32.2 pg (ref 26.0–34.0)
MCHC: 31 g/dL (ref 30.0–36.0)
MCHC: 31.2 g/dL (ref 30.0–36.0)
MCV: 103.1 fL — AB (ref 78.0–100.0)
MCV: 103.1 fL — ABNORMAL HIGH (ref 78.0–100.0)
PLATELETS: 121 10*3/uL — AB (ref 150–400)
Platelets: 150 10*3/uL (ref 150–400)
RBC: 3.54 MIL/uL — ABNORMAL LOW (ref 3.87–5.11)
RBC: 3.82 MIL/uL — AB (ref 3.87–5.11)
RDW: 14.3 % (ref 11.5–15.5)
RDW: 14.1 % (ref 11.5–15.5)
WBC: 7.7 10*3/uL (ref 4.0–10.5)
WBC: 9.4 10*3/uL (ref 4.0–10.5)

## 2016-06-26 LAB — HEPARIN LEVEL (UNFRACTIONATED)
HEPARIN UNFRACTIONATED: 0.11 [IU]/mL — AB (ref 0.30–0.70)
Heparin Unfractionated: 0.73 IU/mL — ABNORMAL HIGH (ref 0.30–0.70)

## 2016-06-26 LAB — BASIC METABOLIC PANEL
ANION GAP: 3 — AB (ref 5–15)
BUN: 10 mg/dL (ref 6–20)
CHLORIDE: 116 mmol/L — AB (ref 101–111)
CO2: 24 mmol/L (ref 22–32)
CREATININE: 0.62 mg/dL (ref 0.44–1.00)
Calcium: 8.1 mg/dL — ABNORMAL LOW (ref 8.9–10.3)
GFR calc non Af Amer: >60 mL/min (ref >60)
Glucose, Bld: 126 mg/dL — ABNORMAL HIGH (ref 65–99)
POTASSIUM: 3.5 mmol/L (ref 3.5–5.1)
SODIUM: 143 mmol/L (ref 135–145)

## 2016-06-26 LAB — FIBRINOGEN
Fibrinogen: 422 mg/dL (ref 210–475)
Fibrinogen: 459 mg/dL (ref 210–475)

## 2016-06-26 LAB — PHOSPHORUS: PHOSPHORUS: 3.2 mg/dL (ref 2.5–4.6)

## 2016-06-26 LAB — MAGNESIUM: MAGNESIUM: 1.7 mg/dL (ref 1.7–2.4)

## 2016-06-26 MED ORDER — DEXTROSE-NACL 5-0.9 % IV SOLN: INTRAVENOUS | Status: DC

## 2016-06-26 MED ORDER — RIVAROXABAN 15 MG PO TABS
15.0000 mg | ORAL_TABLET | Freq: Two times a day (BID) | ORAL | Status: DC
  Administered 2016-06-26 – 2016-06-27 (×2): 15 mg via ORAL
  Filled 2016-06-26 (×2): qty 1

## 2016-06-26 NOTE — Procedures (Signed)
Interventional Radiology Procedure Note  Procedure:  Follow up after bilateral PE lysis  Complications: None  Estimated Blood Loss: None  Repeat pulmonary artery pressure measurements show decrease in pressures from 55/33 (42) mm Hg yesterday to 44/29 (35) mm Hg today.  Patient subjectively feels better.  Catheters and sheaths removed and hemostasis obtained with manual compression.  Venetia Night. Kathlene Cote, M.D Pager:  [phone]

## 2016-06-26 NOTE — Progress Notes (Signed)
Follow up pulmonary pressures done by Dr. Kathlene Cote. Pre 55/33 (42). Post 50/28 (36), 44/29 (35). Pt awake and alert. In no distress. Left groin catheter removed. Dressing applied. Site dry and intact.

## 2016-06-26 NOTE — Progress Notes (Signed)
Pt transferred from 83M this evening. Alert, oriented and able to voice needs. No complaints expressed at this time.

## 2016-06-26 NOTE — Progress Notes (Signed)
[place] for heparin/xarelto Indication: pulmonary embolus  Allergies  Allergen Reactions  . Povidone-Iodine Rash    Patient Measurements: Height: 5\' 7"  (170.2 cm) Weight: 236 lb 5.3 oz (107.2 kg) IBW/kg (Calculated) : 61.6 Heparin Dosing Weight: 85 kg   Vital Signs: Temp: 97.6 F (36.4 C) (09/15 1106) Temp Source: Oral (09/15 1106) BP: 123/103 (09/15 1100) Pulse Rate: 95 (09/15 1100)  Labs:  Recent Labs  06/24/16 1712  06/25/16 0608 06/25/16 1500 06/25/16 1600 06/25/16 2209 06/26/16 0222 06/26/16 0400 06/26/16 0900  HGB 14.7  --  12.1 12.1  --  11.9*  --  11.3*  --   HCT 44.8  --  37.5 38.5  --  37.8  --  36.5  --   PLT 190  --  139* 146*  --  126*  --  121*  --   HEPARINUNFRC  --   < > 0.74*  --  1.68*  --  0.11*  --  0.73*  CREATININE 0.79  --  0.57  --   --   --   --  0.62  --   < > = values in this interval not displayed.  Estimated Creatinine Clearance: 100.1 mL/min (by C-G formula based on SCr of 0.62 mg/dL).   Assessment: 55 yo female admitted with dyspnea. Pharmacy consulted to dose heparin for pulmonary embolism. Patient with history of DVT/PE. No current oral AC PTA at this time, although per notes was on coumadin in 2012. She is a candidate for lifelong oral anticoagulation. Heparin level supratherapeutic. H/H and platelets down. No s/sx of bleeding noted s/p EKOS completion at 0329 this morning.    Goal of Therapy:  Heparin level 0.3-0.7 units/ml Monitor platelets by anticoagulation protocol: Yes     Plan:  Heparin 1000 units/hr to stop at 1700 Transition to xarelto 15 bid for 21 days, then 20mg  daily starting on 07/18/16 Monitor s/sx of bleeding, CBC q72 hours  Dierdre Harness, Cain Sieve, PharmD Clinical Pharmacy Resident [phone] (Pager) 06/26/2016 11:46 AM

## 2016-06-26 NOTE — Progress Notes (Signed)
ANTICOAGULATION CONSULT NOTE - Follow Up Consult  Pharmacy Consult for heparin Indication: pulmonary embolus and EKOS  Labs:  Recent Labs  06/24/16 1712  06/25/16 0608 06/25/16 1500 06/25/16 1600 06/25/16 2209 06/26/16 0222  HGB 14.7  --  12.1 12.1  --  11.9*  --   HCT 44.8  --  37.5 38.5  --  37.8  --   PLT 190  --  139* 146*  --  126*  --   HEPARINUNFRC  --   < > 0.74*  --  1.68*  --  0.11*  CREATININE 0.79  --  0.57  --   --   --   --   < > = values in this interval not displayed.    Assessment: 55yo female now subtherapeutic on heparin after rate decrease for very high level; suspect that level was inaccurate.  Goal of Therapy:  Heparin level 0.3-0.7 units/ml   Plan:  Will increase heparin gtt by 2 units/kg/hr to 1100 units/hr and check level in 6hr.  Wynona Neat, PharmD, BCPS  06/26/2016,2:48 AM

## 2016-06-26 NOTE — Progress Notes (Signed)
TPA infusion completed [phone]

## 2016-06-27 MED ORDER — RIVAROXABAN 20 MG PO TABS: 20.0000 mg | ORAL_TABLET | Freq: Every day | ORAL | 6 refills | Status: DC

## 2016-06-27 MED ORDER — RIVAROXABAN (XARELTO) VTE STARTER PACK (15 & 20 MG): ORAL_TABLET | ORAL | 0 refills | Status: DC

## 2016-06-27 NOTE — Progress Notes (Signed)
Per PT pt. Is cleared to go home. Oxygen on room air is 98%.

## 2016-06-27 NOTE — Progress Notes (Signed)
Physical Therapy Evaluation & Discharge Patient Details Name: Joyce Payne MRN: [id number] DOB: June 28, 1961 Today's Date: 06/27/2016   History of Present Illness  55 year old with prior history of Hodgkin's lymphoma and DVT/PE in 2011 admitted with submassive pulmonary embolism  Clinical Impression  Patient presents with feeling of general weakness and deconditioning due to extended hospitalization, using RW for support and security.  Transfers and ambulation over 300 feet with MOD Independence using walker at decreased pace, maintaining 95-97% O2 saturation throughout evaluation.  Balance screen with mild unsteadiness attributed to patient's reported weakness from decreased activity.  Patient without questions, has good support system for home, no clinical need for continued therapy.  Educated patient on exercise/activity dosing for recovery at home.  DC skilled PT services.    Follow Up Recommendations No PT follow up    Equipment Recommendations   (Patient reports will be getting equipment at home)    Recommendations for Other Services       Precautions / Restrictions Precautions Precautions: Fall Restrictions Weight Bearing Restrictions: No      Mobility  Bed Mobility                  Transfers Overall transfer level: Modified independent Equipment used: Rolling walker (2 wheeled)             General transfer comment: Walker for stability, offset for feeling weak  Ambulation/Gait Ambulation/Gait assistance: Modified independent (Device/Increase time) Ambulation Distance (Feet): 300 Feet Assistive device: Rolling walker (2 wheeled) Gait Pattern/deviations: Step-through pattern   Gait velocity interpretation: Below normal speed for age/gender    Stairs            Wheelchair Mobility    Modified Rankin (Stroke Patients Only)       Balance Overall balance assessment: Modified Independent                   Tandem Stance - Right Leg:  10 Tandem Stance - Left Leg: 10 Rhomberg - Eyes Opened: 10 Rhomberg - Eyes Closed: 10                 Pertinent Vitals/Pain Pain Assessment: 0-10 Pain Score: 2  Pain Location: Right groin, (access for procedure) Pain Descriptors / Indicators: Aching Pain Intervention(s): Monitored during session    Home Living Family/patient expects to be discharged to:: Private residence Living Arrangements: Spouse/significant other Available Help at Discharge: Family Type of Home: House Home Access: Level entry     Home Layout: Able to live on main level with bedroom/bathroom Home Equipment: Crutches (Report that they will have walker delivered tonight.)      Prior Function Level of Independence: Independent               Hand Dominance        Extremity/Trunk Assessment   Upper Extremity Assessment: Overall WFL for tasks assessed           Lower Extremity Assessment: Overall WFL for tasks assessed         Communication   Communication: No difficulties  Cognition Arousal/Alertness: Awake/alert Behavior During Therapy: WFL for tasks assessed/performed Overall Cognitive Status: Within Functional Limits for tasks assessed                      General Comments      Exercises     Assessment/Plan    PT Assessment Patent does not need any further PT services  PT Problem List  PT Treatment Interventions      PT Goals (Current goals can be found in the Care Plan section)  Acute Rehab PT Goals Patient Stated Goal: Go home    Frequency     Barriers to discharge        Co-evaluation               End of Session Equipment Utilized During Treatment: Gait belt Activity Tolerance: Patient tolerated treatment well Patient left: with nursing/sitter in room Nurse Communication: Mobility status         Time: 1250-1310 PT Time Calculation (min) (ACUTE ONLY): 20 min   Charges:   PT Evaluation $PT Eval Low Complexity: 1  Procedure     PT G Codes:        Dory Demont L 06-29-16, 1:22 PM

## 2016-06-27 NOTE — Progress Notes (Signed)
Patient ID: Joyce Payne, female   DOB: 18-Aug-1961, 55 y.o.   MRN: [id number]    Referring Physician(s): Dr Janene Madeira  Supervising Physician: Jacqulynn Cadet  Patient Status: inpt  Chief Complaint: PE lysis  Subjective: Patient is doing well.  No SOB, no pain.  Going home today.  Allergies: Povidone-iodine  Medications: Prior to Admission medications   Medication Sig Start Date End Date Taking? Authorizing Provider  baclofen (LIORESAL) 10 MG tablet Take 10-20 mg by mouth 3 (three) times daily.   Yes Historical Provider, MD  celecoxib (CELEBREX) 200 MG capsule Take 200 mg by mouth 2 (two) times daily.   Yes Historical Provider, MD  Cyanocobalamin (B-12 PO) Take 1 tablet by mouth every 3 (three) days.   Yes Historical Provider, MD  diazepam (VALIUM) 2 MG tablet Take 2 mg by mouth 3 (three) times daily.    Yes Historical Provider, MD  DULoxetine (CYMBALTA) 60 MG capsule Take 60 mg by mouth daily.   Yes Historical Provider, MD  fesoterodine (TOVIAZ) 8 MG TB24 tablet Take 8 mg by mouth daily.   Yes Historical Provider, MD  fexofenadine (ALLEGRA) 180 MG tablet Take 180 mg by mouth daily.   Yes Historical Provider, MD  fluticasone (FLONASE) 50 MCG/ACT nasal spray Place 2 sprays into both nostrils daily.   Yes Historical Provider, MD  HYDROcodone-acetaminophen (NORCO) 10-325 MG tablet Take 1 tablet by mouth every 6 (six) hours as needed for moderate pain.    Yes Historical Provider, MD  levofloxacin (LEVAQUIN) 500 MG tablet Take 500 mg by mouth daily. STARTED 06/22/16, FOR 10 DAYS ENDING 07/01/16   Yes Historical Provider, MD  lisdexamfetamine (VYVANSE) 70 MG capsule Take 70 mg by mouth daily.   Yes Historical Provider, MD  Misc Natural Products (COLON CLEANSE) CAPS Take 7 capsules by mouth daily.   Yes Historical Provider, MD  Omeprazole-Sodium Bicarbonate (ZEGERID) 20-1100 MG CAPS capsule Take 1 capsule by mouth daily before breakfast.   Yes Historical Provider, MD    pregabalin (LYRICA) 150 MG capsule Take 150 mg by mouth 3 (three) times daily.   Yes Historical Provider, MD  tiZANidine (ZANAFLEX) 4 MG tablet Take 4 mg by mouth every 6 (six) hours as needed for muscle spasms.   Yes Historical Provider, MD  traZODone (DESYREL) 150 MG tablet Take 150 mg by mouth at bedtime.   Yes Historical Provider, MD  oxyCODONE (ROXICODONE) 5 MG immediate release tablet Take 1-2 tablets (5-10 mg total) by mouth every 4 (four) hours as needed for moderate pain or severe pain. Patient not taking: Reported on 06/24/2016 06/27/15   Wylene Simmer, MD  rivaroxaban (XARELTO) 20 MG TABS tablet Take 1 tablet (20 mg total) by mouth daily with supper. Start on 07/18/16 07/18/16   Ripudeep Krystal Eaton, MD  Rivaroxaban 15 & 20 MG TBPK Take as directed on package: Start with one '15mg'$  tablet by mouth twice a day with food. On Day 22 (07/18/16), switch to one '20mg'$  tablet once a day with food/supper. 06/27/16   Ripudeep Krystal Eaton, MD    Vital Signs: BP 115/61 (BP Location: Left Arm)   Pulse 84   Temp 98.4 F (36.9 C) (Oral)   Resp 18   Ht '5\' 7"'$  (1.702 m)   Wt 236 lb 5.3 oz (107.2 kg)   LMP 10/25/2012   SpO2 94%   BMI 37.02 kg/m   Physical Exam: Lungs: CTAB Skin: right inguinal site is stable.  No hematoma or evidence of  infection.  Imaging: Dg Chest 2 View  Result Date: 06/24/2016 CLINICAL DATA:  Chest pain, shortness of breath over the last 2 days, dizziness EXAM: CHEST  2 VIEW COMPARISON:  Chest x-ray of 06/24/2016 FINDINGS: No active infiltrate or effusion is seen. Mediastinal and hilar contours are unremarkable. The heart is within upper limits of normal. Healed right rib fractures are again noted. IMPRESSION: No active cardiopulmonary disease. Electronically Signed   By: Ivar Drape M.D.   On: 06/24/2016 16:49   Ct Head Wo Contrast  Result Date: 06/24/2016 CLINICAL DATA:  Intense headaches. EXAM: CT HEAD WITHOUT CONTRAST TECHNIQUE: Contiguous axial images were obtained from the base of the  skull through the vertex without intravenous contrast. COMPARISON:  None. FINDINGS: Brain: No mass lesion, intraparenchymal hemorrhage or extra-axial collection. No evidence of acute cortical infarct. Brain parenchyma and CSF-containing spaces are normal for age. Vascular: No hyperdense vessel or atherosclerotic calcification. Skull: Normal visualized skull base, calvarium and extracranial soft tissues. Sinuses/Orbits: No sinus fluid levels or advanced mucosal thickening. No mastoid effusion. Normal orbits. IMPRESSION: Normal head CT. Electronically Signed   By: Ulyses Jarred M.D.   On: 06/24/2016 23:25   Ct Angio Chest Pe W And/or Wo Contrast  Addendum Date: 06/24/2016   ADDENDUM REPORT: 06/24/2016 22:09 ADDENDUM: Positive for acute PE with CT evidence of right heart strain (RV/LV Ratio = 1.95) consistent with at least submassive (intermediate risk) PE. The presence of right heart strain has been associated with an increased risk of morbidity and mortality. Please activate Code PE by paging [phone]. These results were discuss at the time of interpretation on 06/24/2016 at 9:50 Pm with Dr. Elsworth Soho, who verbally acknowledged these results. Electronically Signed   By: Kerby Moors M.D.   On: 06/24/2016 22:09   Result Date: 06/24/2016 CLINICAL DATA:  Chest tightness, dyspnea history of DVT and Hodgkin's lymphoma EXAM: CT ANGIOGRAPHY CHEST WITH CONTRAST TECHNIQUE: Multidetector CT imaging of the chest was performed using the standard protocol during bolus administration of intravenous contrast. Multiplanar CT image reconstructions and MIPs were obtained to evaluate the vascular anatomy. CONTRAST:  70 cc Isovue 370 COMPARISON:  Radiograph 06/24/2016, CT thorax 02/06/2013 FINDINGS: Cardiovascular: There are extensive filling defect identified within the distal right main pulmonary artery, consistent with acute thrombus. Filling defects extend into the segmental branches of the right upper and middle lobes and  segmental and subsegmental branches of the right lower lobe. Additional linear thrombus is visualized within the left main pulmonary artery with thrombus extending into segmental branches of left upper lobe and segmental and subsegmental branches of the left lower lobe. Right pulmonary artery is expanded by the thrombus. Right atrium and right ventricle do not appear enlarged. No pericardial effusion. Thoracic aorta is non aneurysmal. Trace fluid is present in the superior pericardial recess. Mediastinum/Nodes: No significant axillary adenopathy. No pathologically enlarged mediastinal lymph nodes are visualized. Trachea and mainstem bronchi are within normal limits. Lungs/Pleura: No significant pleural effusion. No pulmonary parenchymal nodules. Patchy deep dependent atelectasis. Hazy posterior lower lobe attenuation likely reflects atelectasis. No focal consolidations. Upper Abdomen: Upper abdominal images demonstrate grossly unremarkable appearance of the liver, spleen, and partially visualized adrenal glands. Musculoskeletal: No acute osseous abnormality. No suspicious bone lesions are evident. Multiple old appearing right-sided rib fractures. Review of the MIP images confirms the above findings. IMPRESSION: Extensive bilateral acute pulmonary emboli, right greater than left as described above. Hazy attenuation of posterior lower lobes, likely reflecting atelectasis. Critical Value/emergent results were called by telephone at  the time of interpretation on 06/24/2016 at 748 pm to Dr. Virgel Manifold , who verbally acknowledged these results. Electronically Signed: By: Donavan Foil M.D. On: 06/24/2016 19:57   Ir Angiogram Pulmonary Bilateral Selective  Result Date: 06/25/2016 INDICATION: Sub massive pulmonary embolism with elevated pulmonary arterial pressure and right-sided heart strain EXAM: 1. ULTRASOUND GUIDANCE FOR VENOUS ACCESS X2 2. PULMONARY ARTERIOGRAPHY 3. FLUOROSCOPIC GUIDED PLACEMENT OF BILATERAL  PULMONARY ARTERIAL LYTIC INFUSION CATHETERS COMPARISON:  Chest CTA - 06/24/2026 MEDICATIONS: None ANESTHESIA/SEDATION: Moderate (conscious) sedation was employed during this procedure. A total of Versed 1 mg and Fentanyl 100 mcg was administered intravenously. Moderate Sedation Time: 28 minutes. The patient's level of consciousness and vital signs were monitored continuously by radiology nursing throughout the procedure under my direct supervision. CONTRAST:  20 cc Isovue-300 FLUOROSCOPY TIME:  4 minutes 6 seconds (48 mGy) COMPLICATIONS: None immediate. TECHNIQUE: Informed written consent was obtained from the patient after a discussion of the risks, benefits and alternatives to treatment. Questions regarding the procedure were encouraged and answered. A timeout was performed prior to the initiation of the procedure. Ultrasound scanning was performed of the right groin and demonstrated wide patency of the right common femoral vein. As such, the right common femoral vein was selected venous access. The right groin was prepped and draped in the usual sterile fashion, and a sterile drape was applied covering the operative field. Maximum barrier sterile technique with sterile gowns and gloves were used for the procedure. A timeout was performed prior to the initiation of the procedure. Local anesthesia was provided with 1% lidocaine. Under direct ultrasound guidance, the right common femoral vein was accessed with a micro puncture kit ultimately allowing placement of a 6 French vascular sheath. Slightly cranial to this initial access, the right common femoral was again accessed with an additional micropuncture kit ultimately allowing placement of an additional 6 French vascular sheath. Ultrasound images were saved for procedural documentation purposes. With the use of a Bentson wire, a vertebral catheter was advanced into the main pulmonary artery and a limited central pulmonary arteriogram was performed. Pressure  measurements were then obtained from the main pulmonary artery. The vertebral catheter was advanced into the distal branch of the left lower lobe pulmonary artery. Limited contrast injection confirmed appropriate positioning. Over an exchange length Rosen wire, the vertebral catheter was exchanged for a 105/12 cm multi side-hole EKOS ultrasound assisted infusion catheter. With the use of a glidewire, a vertebral catheter was advanced into a distal branch of the right lower lobe pulmonary artery. Limited contrast injection confirmed appropriate positioning. Over an exchange length Rosen wire, the pigtail catheter was exchanged for a 105/18 cm multi side-hole EKOS ultrasound assisted infusion catheter. A postprocedural fluoroscopic image was obtained to document final catheter positioning. Both vascular sheath were secured at the right neck with an interrupted 0 silk suture. The external catheter tubing was secured at the right thigh and the lytic therapy was initiated. The patient tolerated the procedure well without immediate postprocedural complication. FINDINGS: Central pulmonary arteriogram demonstrates near occlusive thrombus within the bifurcation of the right main pulmonary artery. Acquired pressure measurements: Main  pulmonary artery - 55/33; mean - 42 (normal: < 25/10) Following the procedure, both ultrasound assisted infusion catheter tips terminate within the distal aspects of the bilateral lower lobe sub segmental pulmonary arteries. IMPRESSION: 1. Successful fluoroscopic guided initiation of bilateral ultrasound assisted catheter directed pulmonary arterial lysis for sub massive pulmonary embolism and right-sided heart strain. 2. Elevated pressure measurements within the  main pulmonary artery compatible with critical pulmonary arterial hypertension. Above findings discussed with Dr. Corrie Dandy (CCM) at the time of procedure completion. Electronically Signed   By: Sandi Mariscal M.D.   On: 06/25/2016 16:22    Ir Angiogram Selective Each Additional Vessel  Result Date: 06/25/2016 INDICATION: Sub massive pulmonary embolism with elevated pulmonary arterial pressure and right-sided heart strain EXAM: 1. ULTRASOUND GUIDANCE FOR VENOUS ACCESS X2 2. PULMONARY ARTERIOGRAPHY 3. FLUOROSCOPIC GUIDED PLACEMENT OF BILATERAL PULMONARY ARTERIAL LYTIC INFUSION CATHETERS COMPARISON:  Chest CTA - 06/24/2026 MEDICATIONS: None ANESTHESIA/SEDATION: Moderate (conscious) sedation was employed during this procedure. A total of Versed 1 mg and Fentanyl 100 mcg was administered intravenously. Moderate Sedation Time: 28 minutes. The patient's level of consciousness and vital signs were monitored continuously by radiology nursing throughout the procedure under my direct supervision. CONTRAST:  20 cc Isovue-300 FLUOROSCOPY TIME:  4 minutes 6 seconds (48 mGy) COMPLICATIONS: None immediate. TECHNIQUE: Informed written consent was obtained from the patient after a discussion of the risks, benefits and alternatives to treatment. Questions regarding the procedure were encouraged and answered. A timeout was performed prior to the initiation of the procedure. Ultrasound scanning was performed of the right groin and demonstrated wide patency of the right common femoral vein. As such, the right common femoral vein was selected venous access. The right groin was prepped and draped in the usual sterile fashion, and a sterile drape was applied covering the operative field. Maximum barrier sterile technique with sterile gowns and gloves were used for the procedure. A timeout was performed prior to the initiation of the procedure. Local anesthesia was provided with 1% lidocaine. Under direct ultrasound guidance, the right common femoral vein was accessed with a micro puncture kit ultimately allowing placement of a 6 French vascular sheath. Slightly cranial to this initial access, the right common femoral was again accessed with an additional micropuncture  kit ultimately allowing placement of an additional 6 French vascular sheath. Ultrasound images were saved for procedural documentation purposes. With the use of a Bentson wire, a vertebral catheter was advanced into the main pulmonary artery and a limited central pulmonary arteriogram was performed. Pressure measurements were then obtained from the main pulmonary artery. The vertebral catheter was advanced into the distal branch of the left lower lobe pulmonary artery. Limited contrast injection confirmed appropriate positioning. Over an exchange length Rosen wire, the vertebral catheter was exchanged for a 105/12 cm multi side-hole EKOS ultrasound assisted infusion catheter. With the use of a glidewire, a vertebral catheter was advanced into a distal branch of the right lower lobe pulmonary artery. Limited contrast injection confirmed appropriate positioning. Over an exchange length Rosen wire, the pigtail catheter was exchanged for a 105/18 cm multi side-hole EKOS ultrasound assisted infusion catheter. A postprocedural fluoroscopic image was obtained to document final catheter positioning. Both vascular sheath were secured at the right neck with an interrupted 0 silk suture. The external catheter tubing was secured at the right thigh and the lytic therapy was initiated. The patient tolerated the procedure well without immediate postprocedural complication. FINDINGS: Central pulmonary arteriogram demonstrates near occlusive thrombus within the bifurcation of the right main pulmonary artery. Acquired pressure measurements: Main  pulmonary artery - 55/33; mean - 42 (normal: < 25/10) Following the procedure, both ultrasound assisted infusion catheter tips terminate within the distal aspects of the bilateral lower lobe sub segmental pulmonary arteries. IMPRESSION: 1. Successful fluoroscopic guided initiation of bilateral ultrasound assisted catheter directed pulmonary arterial lysis for sub massive pulmonary embolism  and right-sided heart strain. 2. Elevated pressure measurements within the main pulmonary artery compatible with critical pulmonary arterial hypertension. Above findings discussed with Dr. Corrie Dandy (CCM) at the time of procedure completion. Electronically Signed   By: Sandi Mariscal M.D.   On: 06/25/2016 16:22   Ir Angiogram Selective Each Additional Vessel  Result Date: 06/25/2016 INDICATION: Sub massive pulmonary embolism with elevated pulmonary arterial pressure and right-sided heart strain EXAM: 1. ULTRASOUND GUIDANCE FOR VENOUS ACCESS X2 2. PULMONARY ARTERIOGRAPHY 3. FLUOROSCOPIC GUIDED PLACEMENT OF BILATERAL PULMONARY ARTERIAL LYTIC INFUSION CATHETERS COMPARISON:  Chest CTA - 06/24/2026 MEDICATIONS: None ANESTHESIA/SEDATION: Moderate (conscious) sedation was employed during this procedure. A total of Versed 1 mg and Fentanyl 100 mcg was administered intravenously. Moderate Sedation Time: 28 minutes. The patient's level of consciousness and vital signs were monitored continuously by radiology nursing throughout the procedure under my direct supervision. CONTRAST:  20 cc Isovue-300 FLUOROSCOPY TIME:  4 minutes 6 seconds (48 mGy) COMPLICATIONS: None immediate. TECHNIQUE: Informed written consent was obtained from the patient after a discussion of the risks, benefits and alternatives to treatment. Questions regarding the procedure were encouraged and answered. A timeout was performed prior to the initiation of the procedure. Ultrasound scanning was performed of the right groin and demonstrated wide patency of the right common femoral vein. As such, the right common femoral vein was selected venous access. The right groin was prepped and draped in the usual sterile fashion, and a sterile drape was applied covering the operative field. Maximum barrier sterile technique with sterile gowns and gloves were used for the procedure. A timeout was performed prior to the initiation of the procedure. Local anesthesia was  provided with 1% lidocaine. Under direct ultrasound guidance, the right common femoral vein was accessed with a micro puncture kit ultimately allowing placement of a 6 French vascular sheath. Slightly cranial to this initial access, the right common femoral was again accessed with an additional micropuncture kit ultimately allowing placement of an additional 6 French vascular sheath. Ultrasound images were saved for procedural documentation purposes. With the use of a Bentson wire, a vertebral catheter was advanced into the main pulmonary artery and a limited central pulmonary arteriogram was performed. Pressure measurements were then obtained from the main pulmonary artery. The vertebral catheter was advanced into the distal branch of the left lower lobe pulmonary artery. Limited contrast injection confirmed appropriate positioning. Over an exchange length Rosen wire, the vertebral catheter was exchanged for a 105/12 cm multi side-hole EKOS ultrasound assisted infusion catheter. With the use of a glidewire, a vertebral catheter was advanced into a distal branch of the right lower lobe pulmonary artery. Limited contrast injection confirmed appropriate positioning. Over an exchange length Rosen wire, the pigtail catheter was exchanged for a 105/18 cm multi side-hole EKOS ultrasound assisted infusion catheter. A postprocedural fluoroscopic image was obtained to document final catheter positioning. Both vascular sheath were secured at the right neck with an interrupted 0 silk suture. The external catheter tubing was secured at the right thigh and the lytic therapy was initiated. The patient tolerated the procedure well without immediate postprocedural complication. FINDINGS: Central pulmonary arteriogram demonstrates near occlusive thrombus within the bifurcation of the right main pulmonary artery. Acquired pressure measurements: Main  pulmonary artery - 55/33; mean - 42 (normal: < 25/10) Following the procedure, both  ultrasound assisted infusion catheter tips terminate within the distal aspects of the bilateral lower lobe sub segmental pulmonary arteries. IMPRESSION: 1. Successful fluoroscopic guided initiation of bilateral ultrasound assisted catheter  directed pulmonary arterial lysis for sub massive pulmonary embolism and right-sided heart strain. 2. Elevated pressure measurements within the main pulmonary artery compatible with critical pulmonary arterial hypertension. Above findings discussed with Dr. Corrie Dandy (CCM) at the time of procedure completion. Electronically Signed   By: Sandi Mariscal M.D.   On: 06/25/2016 16:22   Ir US Guide Vasc Access Right  Result Date: 06/25/2016 INDICATION: Sub massive pulmonary embolism with elevated pulmonary arterial pressure and right-sided heart strain EXAM: 1. ULTRASOUND GUIDANCE FOR VENOUS ACCESS X2 2. PULMONARY ARTERIOGRAPHY 3. FLUOROSCOPIC GUIDED PLACEMENT OF BILATERAL PULMONARY ARTERIAL LYTIC INFUSION CATHETERS COMPARISON:  Chest CTA - 06/24/2026 MEDICATIONS: None ANESTHESIA/SEDATION: Moderate (conscious) sedation was employed during this procedure. A total of Versed 1 mg and Fentanyl 100 mcg was administered intravenously. Moderate Sedation Time: 28 minutes. The patient's level of consciousness and vital signs were monitored continuously by radiology nursing throughout the procedure under my direct supervision. CONTRAST:  20 cc Isovue-300 FLUOROSCOPY TIME:  4 minutes 6 seconds (48 mGy) COMPLICATIONS: None immediate. TECHNIQUE: Informed written consent was obtained from the patient after a discussion of the risks, benefits and alternatives to treatment. Questions regarding the procedure were encouraged and answered. A timeout was performed prior to the initiation of the procedure. Ultrasound scanning was performed of the right groin and demonstrated wide patency of the right common femoral vein. As such, the right common femoral vein was selected venous access. The right groin was  prepped and draped in the usual sterile fashion, and a sterile drape was applied covering the operative field. Maximum barrier sterile technique with sterile gowns and gloves were used for the procedure. A timeout was performed prior to the initiation of the procedure. Local anesthesia was provided with 1% lidocaine. Under direct ultrasound guidance, the right common femoral vein was accessed with a micro puncture kit ultimately allowing placement of a 6 French vascular sheath. Slightly cranial to this initial access, the right common femoral was again accessed with an additional micropuncture kit ultimately allowing placement of an additional 6 French vascular sheath. Ultrasound images were saved for procedural documentation purposes. With the use of a Bentson wire, a vertebral catheter was advanced into the main pulmonary artery and a limited central pulmonary arteriogram was performed. Pressure measurements were then obtained from the main pulmonary artery. The vertebral catheter was advanced into the distal branch of the left lower lobe pulmonary artery. Limited contrast injection confirmed appropriate positioning. Over an exchange length Rosen wire, the vertebral catheter was exchanged for a 105/12 cm multi side-hole EKOS ultrasound assisted infusion catheter. With the use of a glidewire, a vertebral catheter was advanced into a distal branch of the right lower lobe pulmonary artery. Limited contrast injection confirmed appropriate positioning. Over an exchange length Rosen wire, the pigtail catheter was exchanged for a 105/18 cm multi side-hole EKOS ultrasound assisted infusion catheter. A postprocedural fluoroscopic image was obtained to document final catheter positioning. Both vascular sheath were secured at the right neck with an interrupted 0 silk suture. The external catheter tubing was secured at the right thigh and the lytic therapy was initiated. The patient tolerated the procedure well without  immediate postprocedural complication. FINDINGS: Central pulmonary arteriogram demonstrates near occlusive thrombus within the bifurcation of the right main pulmonary artery. Acquired pressure measurements: Main  pulmonary artery - 55/33; mean - 42 (normal: < 25/10) Following the procedure, both ultrasound assisted infusion catheter tips terminate within the distal aspects of the bilateral lower lobe sub segmental pulmonary arteries. IMPRESSION: 1.  Successful fluoroscopic guided initiation of bilateral ultrasound assisted catheter directed pulmonary arterial lysis for sub massive pulmonary embolism and right-sided heart strain. 2. Elevated pressure measurements within the main pulmonary artery compatible with critical pulmonary arterial hypertension. Above findings discussed with Dr. Christene Slates (CCM) at the time of procedure completion. Electronically Signed   By: Simonne Come M.D.   On: 06/25/2016 16:22   Ir Infusion Thrombol Arterial Initial (ms)  Result Date: 06/25/2016 INDICATION: Sub massive pulmonary embolism with elevated pulmonary arterial pressure and right-sided heart strain EXAM: 1. ULTRASOUND GUIDANCE FOR VENOUS ACCESS X2 2. PULMONARY ARTERIOGRAPHY 3. FLUOROSCOPIC GUIDED PLACEMENT OF BILATERAL PULMONARY ARTERIAL LYTIC INFUSION CATHETERS COMPARISON:  Chest CTA - 06/24/2026 MEDICATIONS: None ANESTHESIA/SEDATION: Moderate (conscious) sedation was employed during this procedure. A total of Versed 1 mg and Fentanyl 100 mcg was administered intravenously. Moderate Sedation Time: 28 minutes. The patient's level of consciousness and vital signs were monitored continuously by radiology nursing throughout the procedure under my direct supervision. CONTRAST:  20 cc Isovue-300 FLUOROSCOPY TIME:  4 minutes 6 seconds (48 mGy) COMPLICATIONS: None immediate. TECHNIQUE: Informed written consent was obtained from the patient after a discussion of the risks, benefits and alternatives to treatment. Questions regarding the  procedure were encouraged and answered. A timeout was performed prior to the initiation of the procedure. Ultrasound scanning was performed of the right groin and demonstrated wide patency of the right common femoral vein. As such, the right common femoral vein was selected venous access. The right groin was prepped and draped in the usual sterile fashion, and a sterile drape was applied covering the operative field. Maximum barrier sterile technique with sterile gowns and gloves were used for the procedure. A timeout was performed prior to the initiation of the procedure. Local anesthesia was provided with 1% lidocaine. Under direct ultrasound guidance, the right common femoral vein was accessed with a micro puncture kit ultimately allowing placement of a 6 French vascular sheath. Slightly cranial to this initial access, the right common femoral was again accessed with an additional micropuncture kit ultimately allowing placement of an additional 6 French vascular sheath. Ultrasound images were saved for procedural documentation purposes. With the use of a Bentson wire, a vertebral catheter was advanced into the main pulmonary artery and a limited central pulmonary arteriogram was performed. Pressure measurements were then obtained from the main pulmonary artery. The vertebral catheter was advanced into the distal branch of the left lower lobe pulmonary artery. Limited contrast injection confirmed appropriate positioning. Over an exchange length Rosen wire, the vertebral catheter was exchanged for a 105/12 cm multi side-hole EKOS ultrasound assisted infusion catheter. With the use of a glidewire, a vertebral catheter was advanced into a distal branch of the right lower lobe pulmonary artery. Limited contrast injection confirmed appropriate positioning. Over an exchange length Rosen wire, the pigtail catheter was exchanged for a 105/18 cm multi side-hole EKOS ultrasound assisted infusion catheter. A postprocedural  fluoroscopic image was obtained to document final catheter positioning. Both vascular sheath were secured at the right neck with an interrupted 0 silk suture. The external catheter tubing was secured at the right thigh and the lytic therapy was initiated. The patient tolerated the procedure well without immediate postprocedural complication. FINDINGS: Central pulmonary arteriogram demonstrates near occlusive thrombus within the bifurcation of the right main pulmonary artery. Acquired pressure measurements: Main  pulmonary artery - 55/33; mean - 42 (normal: < 25/10) Following the procedure, both ultrasound assisted infusion catheter tips terminate within the distal aspects of the  bilateral lower lobe sub segmental pulmonary arteries. IMPRESSION: 1. Successful fluoroscopic guided initiation of bilateral ultrasound assisted catheter directed pulmonary arterial lysis for sub massive pulmonary embolism and right-sided heart strain. 2. Elevated pressure measurements within the main pulmonary artery compatible with critical pulmonary arterial hypertension. Above findings discussed with Dr. Corrie Dandy (CCM) at the time of procedure completion. Electronically Signed   By: Sandi Mariscal M.D.   On: 06/25/2016 16:22   Ir Infusion Thrombol Arterial Initial (ms)  Result Date: 06/25/2016 INDICATION: Sub massive pulmonary embolism with elevated pulmonary arterial pressure and right-sided heart strain EXAM: 1. ULTRASOUND GUIDANCE FOR VENOUS ACCESS X2 2. PULMONARY ARTERIOGRAPHY 3. FLUOROSCOPIC GUIDED PLACEMENT OF BILATERAL PULMONARY ARTERIAL LYTIC INFUSION CATHETERS COMPARISON:  Chest CTA - 06/24/2026 MEDICATIONS: None ANESTHESIA/SEDATION: Moderate (conscious) sedation was employed during this procedure. A total of Versed 1 mg and Fentanyl 100 mcg was administered intravenously. Moderate Sedation Time: 28 minutes. The patient's level of consciousness and vital signs were monitored continuously by radiology nursing throughout the  procedure under my direct supervision. CONTRAST:  20 cc Isovue-300 FLUOROSCOPY TIME:  4 minutes 6 seconds (48 mGy) COMPLICATIONS: None immediate. TECHNIQUE: Informed written consent was obtained from the patient after a discussion of the risks, benefits and alternatives to treatment. Questions regarding the procedure were encouraged and answered. A timeout was performed prior to the initiation of the procedure. Ultrasound scanning was performed of the right groin and demonstrated wide patency of the right common femoral vein. As such, the right common femoral vein was selected venous access. The right groin was prepped and draped in the usual sterile fashion, and a sterile drape was applied covering the operative field. Maximum barrier sterile technique with sterile gowns and gloves were used for the procedure. A timeout was performed prior to the initiation of the procedure. Local anesthesia was provided with 1% lidocaine. Under direct ultrasound guidance, the right common femoral vein was accessed with a micro puncture kit ultimately allowing placement of a 6 French vascular sheath. Slightly cranial to this initial access, the right common femoral was again accessed with an additional micropuncture kit ultimately allowing placement of an additional 6 French vascular sheath. Ultrasound images were saved for procedural documentation purposes. With the use of a Bentson wire, a vertebral catheter was advanced into the main pulmonary artery and a limited central pulmonary arteriogram was performed. Pressure measurements were then obtained from the main pulmonary artery. The vertebral catheter was advanced into the distal branch of the left lower lobe pulmonary artery. Limited contrast injection confirmed appropriate positioning. Over an exchange length Rosen wire, the vertebral catheter was exchanged for a 105/12 cm multi side-hole EKOS ultrasound assisted infusion catheter. With the use of a glidewire, a vertebral  catheter was advanced into a distal branch of the right lower lobe pulmonary artery. Limited contrast injection confirmed appropriate positioning. Over an exchange length Rosen wire, the pigtail catheter was exchanged for a 105/18 cm multi side-hole EKOS ultrasound assisted infusion catheter. A postprocedural fluoroscopic image was obtained to document final catheter positioning. Both vascular sheath were secured at the right neck with an interrupted 0 silk suture. The external catheter tubing was secured at the right thigh and the lytic therapy was initiated. The patient tolerated the procedure well without immediate postprocedural complication. FINDINGS: Central pulmonary arteriogram demonstrates near occlusive thrombus within the bifurcation of the right main pulmonary artery. Acquired pressure measurements: Main  pulmonary artery - 55/33; mean - 42 (normal: < 25/10) Following the procedure, both ultrasound assisted infusion  catheter tips terminate within the distal aspects of the bilateral lower lobe sub segmental pulmonary arteries. IMPRESSION: 1. Successful fluoroscopic guided initiation of bilateral ultrasound assisted catheter directed pulmonary arterial lysis for sub massive pulmonary embolism and right-sided heart strain. 2. Elevated pressure measurements within the main pulmonary artery compatible with critical pulmonary arterial hypertension. Above findings discussed with Dr. Corrie Dandy (CCM) at the time of procedure completion. Electronically Signed   By: Sandi Mariscal M.D.   On: 06/25/2016 16:22   Ir Jacolyn Reedy F/u Eval Art/ven Final Day (ms)  Result Date: 06/26/2016 INDICATION: Status post initiation of bilateral ultrasound assisted thrombolytic therapy for submassive bilateral pulmonary embolism with right heart strain yesterday. 12 hour infusion of tPA bilaterally via EKOS catheters has been completed and she returns for repeat pulmonary artery pressure measurements and removal of the infusion catheters  and femoral sheaths. EXAM: IR THROMB F/U EVAL ART/VEN FINAL DAY COMPARISON:  06/25/2016 MEDICATIONS: None. ANESTHESIA/SEDATION: None FLUOROSCOPY TIME:  Fluoroscopy Time: 6 seconds.  0.3 mGy. COMPLICATIONS: None. TECHNIQUE: Fluoroscopy was performed to confirm infusion catheter positioning in the pulmonary arteries bilaterally. Pulmonary arterial pressures were obtained. Both infusion catheters and sheaths were removed and hemostasis obtained with manual compression over the right groin. FINDINGS: Repeat pulmonary artery pressure is 44/29 (35) mm Hg. This is compared to pre-procedural pulmonary artery pressure measurement of 55/33 (42) mm Hg. The patient does have a history of prior pulmonary embolism and some degree of pulmonary arterial hypertension may have been present prior the current acute pulmonary embolic event. Due to improvement in pressures, no further thrombolytic infusion will be continued and catheters and sheaths were removed today. IMPRESSION: Decrease in measured pulmonary artery pressures after bilateral ultrasound assisted catheter thrombolytic therapy to treat bilateral pulmonary embolism. Electronically Signed   By: Aletta Edouard M.D.   On: 06/26/2016 16:31    Labs:  CBC:  Recent Labs  06/25/16 1500 06/25/16 2209 06/26/16 0400 06/26/16 1923  WBC 7.4 8.0 7.7 9.4  HGB 12.1 11.9* 11.3* 12.3  HCT 38.5 37.8 36.5 39.4  PLT 146* 126* 121* 150    COAGS: No results for input(s): INR, APTT in the last 8760 hours.  BMP:  Recent Labs  06/24/16 1712 06/25/16 0608 06/26/16 0400  NA 140 144 143  K 4.0 3.7 3.5  CL 109 115* 116*  CO2 '22 24 24  '$ GLUCOSE 121* 127* 126*  BUN 26* 14 10  CALCIUM 9.1 8.2* 8.1*  CREATININE 0.79 0.57 0.62  GFRNONAA >60 >60 >60  GFRAA >60 >60 >60    LIVER FUNCTION TESTS: No results for input(s): BILITOT, AST, ALT, ALKPHOS, PROT, ALBUMIN in the last 8760 hours.  Assessment and Plan: 1. S/p PE lysis 9/14, 9/15 -patient doing well -recommend  follow up with Dr. Alvy Bimler given hx of PE/DVT and lymphoma and new PE. -no further intervention at this time, ok for DC home from our standpoint  Electronically Signed: Mohanad Carsten E 06/27/2016, 12:20 PM   I spent a total of 15 Minutes at the the patient's bedside AND on the patient's hospital floor or unit, greater than 50% of which was counseling/coordinating care for PE, s/p lysis

## 2016-06-27 NOTE — Discharge Instructions (Signed)
Information on my medicine - XARELTO (rivaroxaban)  This medication education was reviewed with me or my healthcare representative as part of my discharge preparation.  The pharmacist that spoke with me during my hospital stay was:  Brain Hilts, [place]? Xarelto was prescribed to treat blood clots that may have been found in the veins of your legs (deep vein thrombosis) or in your lungs (pulmonary embolism) and to reduce the risk of them occurring again.  What do you need to know about Xarelto? The starting dose is one 15 mg tablet taken TWICE daily with food for the FIRST 21 DAYS then on Saturday 07/18/16  the dose is changed to one 20 mg tablet taken ONCE A DAY with your evening meal.  DO NOT stop taking Xarelto without talking to the health care provider who prescribed the medication.  Refill your prescription for 20 mg tablets before you run out.  After discharge, you should have regular check-up appointments with your healthcare provider that is prescribing your Xarelto.  In the future your dose may need to be changed if your kidney function changes by a significant amount.  What do you do if you miss a dose? If you are taking Xarelto TWICE DAILY and you miss a dose, take it as soon as you remember. You may take two 15 mg tablets (total 30 mg) at the same time then resume your regularly scheduled 15 mg twice daily the next day.  If you are taking Xarelto ONCE DAILY and you miss a dose, take it as soon as you remember on the same day then continue your regularly scheduled once daily regimen the next day. Do not take two doses of Xarelto at the same time.   Important Safety Information Xarelto is a blood thinner medicine that can cause bleeding. You should call your healthcare provider right away if you experience any of the following: ? Bleeding from an injury or your nose that does not stop. ? Unusual colored urine (red or dark brown) or  unusual colored stools (red or black). ? Unusual bruising for unknown reasons. ? A serious fall or if you hit your head (even if there is no bleeding).  Some medicines may interact with Xarelto and might increase your risk of bleeding while on Xarelto. To help avoid this, consult your healthcare provider or pharmacist prior to using any new prescription or non-prescription medications, including herbals, vitamins, non-steroidal anti-inflammatory drugs (NSAIDs) and supplements.  This website has more information on Xarelto: [email or web address]

## 2016-06-27 NOTE — Discharge Summary (Signed)
Physician Discharge Summary   Patient ID: Joyce Payne MRN: [id number] DOB/AGE: 07-04-1961 55 y.o.  Admit date: 06/24/2016 Discharge date: 06/27/2016  Primary Care Physician:   Melinda Crutch, MD  Discharge Diagnoses:    . Acute bilateral pulmonary emboli, right greater than left s/p EKOS  (second episode)   Acute right LE DVT   History of Hodgkin lymphoma   Right heart strain, pulmonary hypertension  Consults:   Patient was admitted by pulmonary critical care Interventional radiology  Recommendations for Outpatient Follow-up:  1. Patient is now started on xarelto per PE protocol, recommended indefinitely  2. Recommended CT angiogram of the chest to rule out chronic PE, pulmonary hypertension and 2-D echogram in 3 months    DIET:Heart healthy diet   Allergies:   Allergies  Allergen Reactions  . Povidone-Iodine Rash     DISCHARGE MEDICATIONS: Current Discharge Medication List    START taking these medications   Details  rivaroxaban (XARELTO) 20 MG TABS tablet Take 1 tablet (20 mg total) by mouth daily with supper. Start on 07/18/16 Qty: 30 tablet, Refills: 6    Rivaroxaban 15 & 20 MG TBPK Take as directed on package: Start with one '15mg'$  tablet by mouth twice a day with food. On Day 22 (07/18/16), switch to one '20mg'$  tablet once a day with food/supper. Qty: 51 each, Refills: 0      CONTINUE these medications which have NOT CHANGED   Details  baclofen (LIORESAL) 10 MG tablet Take 10-20 mg by mouth 3 (three) times daily.    celecoxib (CELEBREX) 200 MG capsule Take 200 mg by mouth 2 (two) times daily.    Cyanocobalamin (B-12 PO) Take 1 tablet by mouth every 3 (three) days.    diazepam (VALIUM) 2 MG tablet Take 2 mg by mouth 3 (three) times daily.     DULoxetine (CYMBALTA) 60 MG capsule Take 60 mg by mouth daily.    fesoterodine (TOVIAZ) 8 MG TB24 tablet Take 8 mg by mouth daily.    fexofenadine (ALLEGRA) 180 MG tablet Take 180 mg by mouth daily.    fluticasone  (FLONASE) 50 MCG/ACT nasal spray Place 2 sprays into both nostrils daily.    HYDROcodone-acetaminophen (NORCO) 10-325 MG tablet Take 1 tablet by mouth every 6 (six) hours as needed for moderate pain.     lisdexamfetamine (VYVANSE) 70 MG capsule Take 70 mg by mouth daily.    Misc Natural Products (COLON CLEANSE) CAPS Take 7 capsules by mouth daily.    Omeprazole-Sodium Bicarbonate (ZEGERID) 20-1100 MG CAPS capsule Take 1 capsule by mouth daily before breakfast.    pregabalin (LYRICA) 150 MG capsule Take 150 mg by mouth 3 (three) times daily.    tiZANidine (ZANAFLEX) 4 MG tablet Take 4 mg by mouth every 6 (six) hours as needed for muscle spasms.    traZODone (DESYREL) 150 MG tablet Take 150 mg by mouth at bedtime.      STOP taking these medications     levofloxacin (LEVAQUIN) 500 MG tablet      oxyCODONE (ROXICODONE) 5 MG immediate release tablet          Brief H and P: For complete details please refer to admission H and P, but in brief Patient is a 55 year old female with prior history of Hodgkin lymphoma, DVT and PE in 2011 was admitted by critical care service with submassive pulmonary embolism. She had acute onset dyspnea for 2 days and chest heaviness. D-dimer was positive. CT A of the chest confirmed extensive  bilateral pulmonary emboli. She was hypotensive on arrival with BP low in 80s and responded to the IV fluid. She also reported leg swelling for about a week when she was at the beach.  Per patient, she had a prior history of DVT/PE in 2011 after finishing chemotherapy for Hodgkin's lymphoma. She took Coumadin for about 6 months and did not have any problems. She reported mild headache for the last 3 weeks-which she described as a migraine. She has chronic back pain after back surgery and takes muscle relaxants and pain medications for this. Per patient, she has also has strong family history of blood clots.  Hospital Course:  Submassive bilateral pulmonary some with acute  right lower extremity DVT and right heart strain - Second episode and strong family history. CT angiogram of the chest confirmed extensive bilateral pulmonary emboli with right heart strain. - Patient was admitted by critical care service to intensive care unit. Doppler ultrasound of the lower extremity showed acute DVT involving the distal femoral and popliteal veins of the right lower extremity. She was started on IV heparin drip - 2-D echo showed normal wall motion and systolic function normal, no regional wall motion abnormalities. Right ventricular systolic function moderately reduced with an McConnell sign, hypokinesis of the RV lateral free wall, PA pressure 74m hg. - IR was consulted, patient underwent thrombolysis. The patient did well after thrombolysis and was transferred to regular floor on 9/15. She was placed on xarelto by pulmonology which she has been tolerating well. Patient was given instructions and risks and benefits of anticoagulation in detail by myself. - Patient was recommended indefinite xarelto/anticoagulation. She was also recommended follow-up with her hematologist and pulmonology. She will need CT angiogram of the chest in 3-4 months and 2-D echocardiogram for follow-up. PT evaluation and home O2 evaluation will be done prior to discharge.  History of Hodgkin's lymphoma - Per patient, in remission and follows hematology, Dr GAlvy Bimler   Day of Discharge BP 115/61 (BP Location: Left Arm)   Pulse 84   Temp 98.4 F (36.9 C) (Oral)   Resp 18   Ht '5\' 7"'$  (1.702 m)   Wt 107.2 kg (236 lb 5.3 oz)   LMP 10/25/2012   SpO2 94%   BMI 37.02 kg/m   Physical Exam: General: Alert and awake oriented x3 not in any acute distress. HEENT: anicteric sclera, pupils reactive to light and accommodation CVS: S1-S2 clear no murmur rubs or gallops Chest: clear to auscultation bilaterally, no wheezing rales or rhonchi Abdomen: soft nontender, nondistended, normal bowel  sounds Extremities: no cyanosis, clubbing or edema noted bilaterally Neuro: Cranial nerves II-XII intact, no focal neurological deficits   The results of significant diagnostics from this hospitalization (including imaging, microbiology, ancillary and laboratory) are listed below for reference.    LAB RESULTS: Basic Metabolic Panel:  Recent Labs Lab 06/25/16 0608 06/26/16 0400  NA 144 143  K 3.7 3.5  CL 115* 116*  CO2 24 24  GLUCOSE 127* 126*  BUN 14 10  CREATININE 0.57 0.62  CALCIUM 8.2* 8.1*  MG  --  1.7  PHOS  --  3.2   Liver Function Tests: No results for input(s): AST, ALT, ALKPHOS, BILITOT, PROT, ALBUMIN in the last 168 hours. No results for input(s): LIPASE, AMYLASE in the last 168 hours. No results for input(s): AMMONIA in the last 168 hours. CBC:  Recent Labs Lab 06/26/16 0400 06/26/16 1923  WBC 7.7 9.4  HGB 11.3* 12.3  HCT 36.5 39.4  MCV 103.1* 103.1*  PLT 121* 150   Cardiac Enzymes: No results for input(s): CKTOTAL, CKMB, CKMBINDEX, TROPONINI in the last 168 hours. BNP: Invalid input(s): POCBNP CBG:  Recent Labs Lab 06/25/16 0105  GLUCAP 123*    Significant Diagnostic Studies:  Dg Chest 2 View  Result Date: 06/24/2016 CLINICAL DATA:  Chest pain, shortness of breath over the last 2 days, dizziness EXAM: CHEST  2 VIEW COMPARISON:  Chest x-ray of 06/24/2016 FINDINGS: No active infiltrate or effusion is seen. Mediastinal and hilar contours are unremarkable. The heart is within upper limits of normal. Healed right rib fractures are again noted. IMPRESSION: No active cardiopulmonary disease. Electronically Signed   By: Ivar Drape M.D.   On: 06/24/2016 16:49   Ct Head Wo Contrast  Result Date: 06/24/2016 CLINICAL DATA:  Intense headaches. EXAM: CT HEAD WITHOUT CONTRAST TECHNIQUE: Contiguous axial images were obtained from the base of the skull through the vertex without intravenous contrast. COMPARISON:  None. FINDINGS: Brain: No mass lesion,  intraparenchymal hemorrhage or extra-axial collection. No evidence of acute cortical infarct. Brain parenchyma and CSF-containing spaces are normal for age. Vascular: No hyperdense vessel or atherosclerotic calcification. Skull: Normal visualized skull base, calvarium and extracranial soft tissues. Sinuses/Orbits: No sinus fluid levels or advanced mucosal thickening. No mastoid effusion. Normal orbits. IMPRESSION: Normal head CT. Electronically Signed   By: Ulyses Jarred M.D.   On: 06/24/2016 23:25   Ct Angio Chest Pe W And/or Wo Contrast  Addendum Date: 06/24/2016   ADDENDUM REPORT: 06/24/2016 22:09 ADDENDUM: Positive for acute PE with CT evidence of right heart strain (RV/LV Ratio = 1.95) consistent with at least submassive (intermediate risk) PE. The presence of right heart strain has been associated with an increased risk of morbidity and mortality. Please activate Code PE by paging [phone]. These results were discuss at the time of interpretation on 06/24/2016 at 9:50 Pm with Dr. Elsworth Soho, who verbally acknowledged these results. Electronically Signed   By: Kerby Moors M.D.   On: 06/24/2016 22:09   Result Date: 06/24/2016 CLINICAL DATA:  Chest tightness, dyspnea history of DVT and Hodgkin's lymphoma EXAM: CT ANGIOGRAPHY CHEST WITH CONTRAST TECHNIQUE: Multidetector CT imaging of the chest was performed using the standard protocol during bolus administration of intravenous contrast. Multiplanar CT image reconstructions and MIPs were obtained to evaluate the vascular anatomy. CONTRAST:  70 cc Isovue 370 COMPARISON:  Radiograph 06/24/2016, CT thorax 02/06/2013 FINDINGS: Cardiovascular: There are extensive filling defect identified within the distal right main pulmonary artery, consistent with acute thrombus. Filling defects extend into the segmental branches of the right upper and middle lobes and segmental and subsegmental branches of the right lower lobe. Additional linear thrombus is visualized within  the left main pulmonary artery with thrombus extending into segmental branches of left upper lobe and segmental and subsegmental branches of the left lower lobe. Right pulmonary artery is expanded by the thrombus. Right atrium and right ventricle do not appear enlarged. No pericardial effusion. Thoracic aorta is non aneurysmal. Trace fluid is present in the superior pericardial recess. Mediastinum/Nodes: No significant axillary adenopathy. No pathologically enlarged mediastinal lymph nodes are visualized. Trachea and mainstem bronchi are within normal limits. Lungs/Pleura: No significant pleural effusion. No pulmonary parenchymal nodules. Patchy deep dependent atelectasis. Hazy posterior lower lobe attenuation likely reflects atelectasis. No focal consolidations. Upper Abdomen: Upper abdominal images demonstrate grossly unremarkable appearance of the liver, spleen, and partially visualized adrenal glands. Musculoskeletal: No acute osseous abnormality. No suspicious bone lesions are evident. Multiple old appearing  right-sided rib fractures. Review of the MIP images confirms the above findings. IMPRESSION: Extensive bilateral acute pulmonary emboli, right greater than left as described above. Hazy attenuation of posterior lower lobes, likely reflecting atelectasis. Critical Value/emergent results were called by telephone at the time of interpretation on 06/24/2016 at 748 pm to Dr. Virgel Manifold , who verbally acknowledged these results. Electronically Signed: By: Donavan Foil M.D. On: 06/24/2016 19:57   Ir Angiogram Pulmonary Bilateral Selective  Result Date: 06/25/2016 INDICATION: Sub massive pulmonary embolism with elevated pulmonary arterial pressure and right-sided heart strain EXAM: 1. ULTRASOUND GUIDANCE FOR VENOUS ACCESS X2 2. PULMONARY ARTERIOGRAPHY 3. FLUOROSCOPIC GUIDED PLACEMENT OF BILATERAL PULMONARY ARTERIAL LYTIC INFUSION CATHETERS COMPARISON:  Chest CTA - 06/24/2026 MEDICATIONS: None  ANESTHESIA/SEDATION: Moderate (conscious) sedation was employed during this procedure. A total of Versed 1 mg and Fentanyl 100 mcg was administered intravenously. Moderate Sedation Time: 28 minutes. The patient's level of consciousness and vital signs were monitored continuously by radiology nursing throughout the procedure under my direct supervision. CONTRAST:  20 cc Isovue-300 FLUOROSCOPY TIME:  4 minutes 6 seconds (48 mGy) COMPLICATIONS: None immediate. TECHNIQUE: Informed written consent was obtained from the patient after a discussion of the risks, benefits and alternatives to treatment. Questions regarding the procedure were encouraged and answered. A timeout was performed prior to the initiation of the procedure. Ultrasound scanning was performed of the right groin and demonstrated wide patency of the right common femoral vein. As such, the right common femoral vein was selected venous access. The right groin was prepped and draped in the usual sterile fashion, and a sterile drape was applied covering the operative field. Maximum barrier sterile technique with sterile gowns and gloves were used for the procedure. A timeout was performed prior to the initiation of the procedure. Local anesthesia was provided with 1% lidocaine. Under direct ultrasound guidance, the right common femoral vein was accessed with a micro puncture kit ultimately allowing placement of a 6 French vascular sheath. Slightly cranial to this initial access, the right common femoral was again accessed with an additional micropuncture kit ultimately allowing placement of an additional 6 French vascular sheath. Ultrasound images were saved for procedural documentation purposes. With the use of a Bentson wire, a vertebral catheter was advanced into the main pulmonary artery and a limited central pulmonary arteriogram was performed. Pressure measurements were then obtained from the main pulmonary artery. The vertebral catheter was advanced  into the distal branch of the left lower lobe pulmonary artery. Limited contrast injection confirmed appropriate positioning. Over an exchange length Rosen wire, the vertebral catheter was exchanged for a 105/12 cm multi side-hole EKOS ultrasound assisted infusion catheter. With the use of a glidewire, a vertebral catheter was advanced into a distal branch of the right lower lobe pulmonary artery. Limited contrast injection confirmed appropriate positioning. Over an exchange length Rosen wire, the pigtail catheter was exchanged for a 105/18 cm multi side-hole EKOS ultrasound assisted infusion catheter. A postprocedural fluoroscopic image was obtained to document final catheter positioning. Both vascular sheath were secured at the right neck with an interrupted 0 silk suture. The external catheter tubing was secured at the right thigh and the lytic therapy was initiated. The patient tolerated the procedure well without immediate postprocedural complication. FINDINGS: Central pulmonary arteriogram demonstrates near occlusive thrombus within the bifurcation of the right main pulmonary artery. Acquired pressure measurements: Main  pulmonary artery - 55/33; mean - 42 (normal: < 25/10) Following the procedure, both ultrasound assisted infusion catheter tips terminate within  the distal aspects of the bilateral lower lobe sub segmental pulmonary arteries. IMPRESSION: 1. Successful fluoroscopic guided initiation of bilateral ultrasound assisted catheter directed pulmonary arterial lysis for sub massive pulmonary embolism and right-sided heart strain. 2. Elevated pressure measurements within the main pulmonary artery compatible with critical pulmonary arterial hypertension. Above findings discussed with Dr. Corrie Dandy (CCM) at the time of procedure completion. Electronically Signed   By: Sandi Mariscal M.D.   On: 06/25/2016 16:22   Ir Angiogram Selective Each Additional Vessel  Result Date: 06/25/2016 INDICATION: Sub massive  pulmonary embolism with elevated pulmonary arterial pressure and right-sided heart strain EXAM: 1. ULTRASOUND GUIDANCE FOR VENOUS ACCESS X2 2. PULMONARY ARTERIOGRAPHY 3. FLUOROSCOPIC GUIDED PLACEMENT OF BILATERAL PULMONARY ARTERIAL LYTIC INFUSION CATHETERS COMPARISON:  Chest CTA - 06/24/2026 MEDICATIONS: None ANESTHESIA/SEDATION: Moderate (conscious) sedation was employed during this procedure. A total of Versed 1 mg and Fentanyl 100 mcg was administered intravenously. Moderate Sedation Time: 28 minutes. The patient's level of consciousness and vital signs were monitored continuously by radiology nursing throughout the procedure under my direct supervision. CONTRAST:  20 cc Isovue-300 FLUOROSCOPY TIME:  4 minutes 6 seconds (48 mGy) COMPLICATIONS: None immediate. TECHNIQUE: Informed written consent was obtained from the patient after a discussion of the risks, benefits and alternatives to treatment. Questions regarding the procedure were encouraged and answered. A timeout was performed prior to the initiation of the procedure. Ultrasound scanning was performed of the right groin and demonstrated wide patency of the right common femoral vein. As such, the right common femoral vein was selected venous access. The right groin was prepped and draped in the usual sterile fashion, and a sterile drape was applied covering the operative field. Maximum barrier sterile technique with sterile gowns and gloves were used for the procedure. A timeout was performed prior to the initiation of the procedure. Local anesthesia was provided with 1% lidocaine. Under direct ultrasound guidance, the right common femoral vein was accessed with a micro puncture kit ultimately allowing placement of a 6 French vascular sheath. Slightly cranial to this initial access, the right common femoral was again accessed with an additional micropuncture kit ultimately allowing placement of an additional 6 French vascular sheath. Ultrasound images were  saved for procedural documentation purposes. With the use of a Bentson wire, a vertebral catheter was advanced into the main pulmonary artery and a limited central pulmonary arteriogram was performed. Pressure measurements were then obtained from the main pulmonary artery. The vertebral catheter was advanced into the distal branch of the left lower lobe pulmonary artery. Limited contrast injection confirmed appropriate positioning. Over an exchange length Rosen wire, the vertebral catheter was exchanged for a 105/12 cm multi side-hole EKOS ultrasound assisted infusion catheter. With the use of a glidewire, a vertebral catheter was advanced into a distal branch of the right lower lobe pulmonary artery. Limited contrast injection confirmed appropriate positioning. Over an exchange length Rosen wire, the pigtail catheter was exchanged for a 105/18 cm multi side-hole EKOS ultrasound assisted infusion catheter. A postprocedural fluoroscopic image was obtained to document final catheter positioning. Both vascular sheath were secured at the right neck with an interrupted 0 silk suture. The external catheter tubing was secured at the right thigh and the lytic therapy was initiated. The patient tolerated the procedure well without immediate postprocedural complication. FINDINGS: Central pulmonary arteriogram demonstrates near occlusive thrombus within the bifurcation of the right main pulmonary artery. Acquired pressure measurements: Main  pulmonary artery - 55/33; mean - 42 (normal: < 25/10) Following the  procedure, both ultrasound assisted infusion catheter tips terminate within the distal aspects of the bilateral lower lobe sub segmental pulmonary arteries. IMPRESSION: 1. Successful fluoroscopic guided initiation of bilateral ultrasound assisted catheter directed pulmonary arterial lysis for sub massive pulmonary embolism and right-sided heart strain. 2. Elevated pressure measurements within the main pulmonary artery  compatible with critical pulmonary arterial hypertension. Above findings discussed with Dr. Corrie Dandy (CCM) at the time of procedure completion. Electronically Signed   By: Sandi Mariscal M.D.   On: 06/25/2016 16:22   Ir Angiogram Selective Each Additional Vessel  Result Date: 06/25/2016 INDICATION: Sub massive pulmonary embolism with elevated pulmonary arterial pressure and right-sided heart strain EXAM: 1. ULTRASOUND GUIDANCE FOR VENOUS ACCESS X2 2. PULMONARY ARTERIOGRAPHY 3. FLUOROSCOPIC GUIDED PLACEMENT OF BILATERAL PULMONARY ARTERIAL LYTIC INFUSION CATHETERS COMPARISON:  Chest CTA - 06/24/2026 MEDICATIONS: None ANESTHESIA/SEDATION: Moderate (conscious) sedation was employed during this procedure. A total of Versed 1 mg and Fentanyl 100 mcg was administered intravenously. Moderate Sedation Time: 28 minutes. The patient's level of consciousness and vital signs were monitored continuously by radiology nursing throughout the procedure under my direct supervision. CONTRAST:  20 cc Isovue-300 FLUOROSCOPY TIME:  4 minutes 6 seconds (48 mGy) COMPLICATIONS: None immediate. TECHNIQUE: Informed written consent was obtained from the patient after a discussion of the risks, benefits and alternatives to treatment. Questions regarding the procedure were encouraged and answered. A timeout was performed prior to the initiation of the procedure. Ultrasound scanning was performed of the right groin and demonstrated wide patency of the right common femoral vein. As such, the right common femoral vein was selected venous access. The right groin was prepped and draped in the usual sterile fashion, and a sterile drape was applied covering the operative field. Maximum barrier sterile technique with sterile gowns and gloves were used for the procedure. A timeout was performed prior to the initiation of the procedure. Local anesthesia was provided with 1% lidocaine. Under direct ultrasound guidance, the right common femoral vein was  accessed with a micro puncture kit ultimately allowing placement of a 6 French vascular sheath. Slightly cranial to this initial access, the right common femoral was again accessed with an additional micropuncture kit ultimately allowing placement of an additional 6 French vascular sheath. Ultrasound images were saved for procedural documentation purposes. With the use of a Bentson wire, a vertebral catheter was advanced into the main pulmonary artery and a limited central pulmonary arteriogram was performed. Pressure measurements were then obtained from the main pulmonary artery. The vertebral catheter was advanced into the distal branch of the left lower lobe pulmonary artery. Limited contrast injection confirmed appropriate positioning. Over an exchange length Rosen wire, the vertebral catheter was exchanged for a 105/12 cm multi side-hole EKOS ultrasound assisted infusion catheter. With the use of a glidewire, a vertebral catheter was advanced into a distal branch of the right lower lobe pulmonary artery. Limited contrast injection confirmed appropriate positioning. Over an exchange length Rosen wire, the pigtail catheter was exchanged for a 105/18 cm multi side-hole EKOS ultrasound assisted infusion catheter. A postprocedural fluoroscopic image was obtained to document final catheter positioning. Both vascular sheath were secured at the right neck with an interrupted 0 silk suture. The external catheter tubing was secured at the right thigh and the lytic therapy was initiated. The patient tolerated the procedure well without immediate postprocedural complication. FINDINGS: Central pulmonary arteriogram demonstrates near occlusive thrombus within the bifurcation of the right main pulmonary artery. Acquired pressure measurements: Main  pulmonary artery -  55/33; mean - 42 (normal: < 25/10) Following the procedure, both ultrasound assisted infusion catheter tips terminate within the distal aspects of the bilateral  lower lobe sub segmental pulmonary arteries. IMPRESSION: 1. Successful fluoroscopic guided initiation of bilateral ultrasound assisted catheter directed pulmonary arterial lysis for sub massive pulmonary embolism and right-sided heart strain. 2. Elevated pressure measurements within the main pulmonary artery compatible with critical pulmonary arterial hypertension. Above findings discussed with Dr. Corrie Dandy (CCM) at the time of procedure completion. Electronically Signed   By: Sandi Mariscal M.D.   On: 06/25/2016 16:22   Ir US Guide Vasc Access Right  Result Date: 06/25/2016 INDICATION: Sub massive pulmonary embolism with elevated pulmonary arterial pressure and right-sided heart strain EXAM: 1. ULTRASOUND GUIDANCE FOR VENOUS ACCESS X2 2. PULMONARY ARTERIOGRAPHY 3. FLUOROSCOPIC GUIDED PLACEMENT OF BILATERAL PULMONARY ARTERIAL LYTIC INFUSION CATHETERS COMPARISON:  Chest CTA - 06/24/2026 MEDICATIONS: None ANESTHESIA/SEDATION: Moderate (conscious) sedation was employed during this procedure. A total of Versed 1 mg and Fentanyl 100 mcg was administered intravenously. Moderate Sedation Time: 28 minutes. The patient's level of consciousness and vital signs were monitored continuously by radiology nursing throughout the procedure under my direct supervision. CONTRAST:  20 cc Isovue-300 FLUOROSCOPY TIME:  4 minutes 6 seconds (48 mGy) COMPLICATIONS: None immediate. TECHNIQUE: Informed written consent was obtained from the patient after a discussion of the risks, benefits and alternatives to treatment. Questions regarding the procedure were encouraged and answered. A timeout was performed prior to the initiation of the procedure. Ultrasound scanning was performed of the right groin and demonstrated wide patency of the right common femoral vein. As such, the right common femoral vein was selected venous access. The right groin was prepped and draped in the usual sterile fashion, and a sterile drape was applied covering the  operative field. Maximum barrier sterile technique with sterile gowns and gloves were used for the procedure. A timeout was performed prior to the initiation of the procedure. Local anesthesia was provided with 1% lidocaine. Under direct ultrasound guidance, the right common femoral vein was accessed with a micro puncture kit ultimately allowing placement of a 6 French vascular sheath. Slightly cranial to this initial access, the right common femoral was again accessed with an additional micropuncture kit ultimately allowing placement of an additional 6 French vascular sheath. Ultrasound images were saved for procedural documentation purposes. With the use of a Bentson wire, a vertebral catheter was advanced into the main pulmonary artery and a limited central pulmonary arteriogram was performed. Pressure measurements were then obtained from the main pulmonary artery. The vertebral catheter was advanced into the distal branch of the left lower lobe pulmonary artery. Limited contrast injection confirmed appropriate positioning. Over an exchange length Rosen wire, the vertebral catheter was exchanged for a 105/12 cm multi side-hole EKOS ultrasound assisted infusion catheter. With the use of a glidewire, a vertebral catheter was advanced into a distal branch of the right lower lobe pulmonary artery. Limited contrast injection confirmed appropriate positioning. Over an exchange length Rosen wire, the pigtail catheter was exchanged for a 105/18 cm multi side-hole EKOS ultrasound assisted infusion catheter. A postprocedural fluoroscopic image was obtained to document final catheter positioning. Both vascular sheath were secured at the right neck with an interrupted 0 silk suture. The external catheter tubing was secured at the right thigh and the lytic therapy was initiated. The patient tolerated the procedure well without immediate postprocedural complication. FINDINGS: Central pulmonary arteriogram demonstrates near  occlusive thrombus within the bifurcation of the right main  pulmonary artery. Acquired pressure measurements: Main  pulmonary artery - 55/33; mean - 42 (normal: < 25/10) Following the procedure, both ultrasound assisted infusion catheter tips terminate within the distal aspects of the bilateral lower lobe sub segmental pulmonary arteries. IMPRESSION: 1. Successful fluoroscopic guided initiation of bilateral ultrasound assisted catheter directed pulmonary arterial lysis for sub massive pulmonary embolism and right-sided heart strain. 2. Elevated pressure measurements within the main pulmonary artery compatible with critical pulmonary arterial hypertension. Above findings discussed with Dr. Corrie Dandy (CCM) at the time of procedure completion. Electronically Signed   By: Sandi Mariscal M.D.   On: 06/25/2016 16:22   Ir Infusion Thrombol Arterial Initial (ms)  Result Date: 06/25/2016 INDICATION: Sub massive pulmonary embolism with elevated pulmonary arterial pressure and right-sided heart strain EXAM: 1. ULTRASOUND GUIDANCE FOR VENOUS ACCESS X2 2. PULMONARY ARTERIOGRAPHY 3. FLUOROSCOPIC GUIDED PLACEMENT OF BILATERAL PULMONARY ARTERIAL LYTIC INFUSION CATHETERS COMPARISON:  Chest CTA - 06/24/2026 MEDICATIONS: None ANESTHESIA/SEDATION: Moderate (conscious) sedation was employed during this procedure. A total of Versed 1 mg and Fentanyl 100 mcg was administered intravenously. Moderate Sedation Time: 28 minutes. The patient's level of consciousness and vital signs were monitored continuously by radiology nursing throughout the procedure under my direct supervision. CONTRAST:  20 cc Isovue-300 FLUOROSCOPY TIME:  4 minutes 6 seconds (48 mGy) COMPLICATIONS: None immediate. TECHNIQUE: Informed written consent was obtained from the patient after a discussion of the risks, benefits and alternatives to treatment. Questions regarding the procedure were encouraged and answered. A timeout was performed prior to the initiation of the  procedure. Ultrasound scanning was performed of the right groin and demonstrated wide patency of the right common femoral vein. As such, the right common femoral vein was selected venous access. The right groin was prepped and draped in the usual sterile fashion, and a sterile drape was applied covering the operative field. Maximum barrier sterile technique with sterile gowns and gloves were used for the procedure. A timeout was performed prior to the initiation of the procedure. Local anesthesia was provided with 1% lidocaine. Under direct ultrasound guidance, the right common femoral vein was accessed with a micro puncture kit ultimately allowing placement of a 6 French vascular sheath. Slightly cranial to this initial access, the right common femoral was again accessed with an additional micropuncture kit ultimately allowing placement of an additional 6 French vascular sheath. Ultrasound images were saved for procedural documentation purposes. With the use of a Bentson wire, a vertebral catheter was advanced into the main pulmonary artery and a limited central pulmonary arteriogram was performed. Pressure measurements were then obtained from the main pulmonary artery. The vertebral catheter was advanced into the distal branch of the left lower lobe pulmonary artery. Limited contrast injection confirmed appropriate positioning. Over an exchange length Rosen wire, the vertebral catheter was exchanged for a 105/12 cm multi side-hole EKOS ultrasound assisted infusion catheter. With the use of a glidewire, a vertebral catheter was advanced into a distal branch of the right lower lobe pulmonary artery. Limited contrast injection confirmed appropriate positioning. Over an exchange length Rosen wire, the pigtail catheter was exchanged for a 105/18 cm multi side-hole EKOS ultrasound assisted infusion catheter. A postprocedural fluoroscopic image was obtained to document final catheter positioning. Both vascular sheath  were secured at the right neck with an interrupted 0 silk suture. The external catheter tubing was secured at the right thigh and the lytic therapy was initiated. The patient tolerated the procedure well without immediate postprocedural complication. FINDINGS: Central pulmonary arteriogram demonstrates near  occlusive thrombus within the bifurcation of the right main pulmonary artery. Acquired pressure measurements: Main  pulmonary artery - 55/33; mean - 42 (normal: < 25/10) Following the procedure, both ultrasound assisted infusion catheter tips terminate within the distal aspects of the bilateral lower lobe sub segmental pulmonary arteries. IMPRESSION: 1. Successful fluoroscopic guided initiation of bilateral ultrasound assisted catheter directed pulmonary arterial lysis for sub massive pulmonary embolism and right-sided heart strain. 2. Elevated pressure measurements within the main pulmonary artery compatible with critical pulmonary arterial hypertension. Above findings discussed with Dr. Corrie Dandy (CCM) at the time of procedure completion. Electronically Signed   By: Sandi Mariscal M.D.   On: 06/25/2016 16:22   Ir Infusion Thrombol Arterial Initial (ms)  Result Date: 06/25/2016 INDICATION: Sub massive pulmonary embolism with elevated pulmonary arterial pressure and right-sided heart strain EXAM: 1. ULTRASOUND GUIDANCE FOR VENOUS ACCESS X2 2. PULMONARY ARTERIOGRAPHY 3. FLUOROSCOPIC GUIDED PLACEMENT OF BILATERAL PULMONARY ARTERIAL LYTIC INFUSION CATHETERS COMPARISON:  Chest CTA - 06/24/2026 MEDICATIONS: None ANESTHESIA/SEDATION: Moderate (conscious) sedation was employed during this procedure. A total of Versed 1 mg and Fentanyl 100 mcg was administered intravenously. Moderate Sedation Time: 28 minutes. The patient's level of consciousness and vital signs were monitored continuously by radiology nursing throughout the procedure under my direct supervision. CONTRAST:  20 cc Isovue-300 FLUOROSCOPY TIME:  4 minutes 6  seconds (48 mGy) COMPLICATIONS: None immediate. TECHNIQUE: Informed written consent was obtained from the patient after a discussion of the risks, benefits and alternatives to treatment. Questions regarding the procedure were encouraged and answered. A timeout was performed prior to the initiation of the procedure. Ultrasound scanning was performed of the right groin and demonstrated wide patency of the right common femoral vein. As such, the right common femoral vein was selected venous access. The right groin was prepped and draped in the usual sterile fashion, and a sterile drape was applied covering the operative field. Maximum barrier sterile technique with sterile gowns and gloves were used for the procedure. A timeout was performed prior to the initiation of the procedure. Local anesthesia was provided with 1% lidocaine. Under direct ultrasound guidance, the right common femoral vein was accessed with a micro puncture kit ultimately allowing placement of a 6 French vascular sheath. Slightly cranial to this initial access, the right common femoral was again accessed with an additional micropuncture kit ultimately allowing placement of an additional 6 French vascular sheath. Ultrasound images were saved for procedural documentation purposes. With the use of a Bentson wire, a vertebral catheter was advanced into the main pulmonary artery and a limited central pulmonary arteriogram was performed. Pressure measurements were then obtained from the main pulmonary artery. The vertebral catheter was advanced into the distal branch of the left lower lobe pulmonary artery. Limited contrast injection confirmed appropriate positioning. Over an exchange length Rosen wire, the vertebral catheter was exchanged for a 105/12 cm multi side-hole EKOS ultrasound assisted infusion catheter. With the use of a glidewire, a vertebral catheter was advanced into a distal branch of the right lower lobe pulmonary artery. Limited contrast  injection confirmed appropriate positioning. Over an exchange length Rosen wire, the pigtail catheter was exchanged for a 105/18 cm multi side-hole EKOS ultrasound assisted infusion catheter. A postprocedural fluoroscopic image was obtained to document final catheter positioning. Both vascular sheath were secured at the right neck with an interrupted 0 silk suture. The external catheter tubing was secured at the right thigh and the lytic therapy was initiated. The patient tolerated the procedure well without  immediate postprocedural complication. FINDINGS: Central pulmonary arteriogram demonstrates near occlusive thrombus within the bifurcation of the right main pulmonary artery. Acquired pressure measurements: Main  pulmonary artery - 55/33; mean - 42 (normal: < 25/10) Following the procedure, both ultrasound assisted infusion catheter tips terminate within the distal aspects of the bilateral lower lobe sub segmental pulmonary arteries. IMPRESSION: 1. Successful fluoroscopic guided initiation of bilateral ultrasound assisted catheter directed pulmonary arterial lysis for sub massive pulmonary embolism and right-sided heart strain. 2. Elevated pressure measurements within the main pulmonary artery compatible with critical pulmonary arterial hypertension. Above findings discussed with Dr. Corrie Dandy (CCM) at the time of procedure completion. Electronically Signed   By: Sandi Mariscal M.D.   On: 06/25/2016 16:22    2D ECHO: Study Conclusions  - Left ventricle: The cavity size was normal. There was mild   concentric hypertrophy. Systolic function was normal. Wall motion   was normal; there were no regional wall motion abnormalities. - Ventricular septum: The contour showed diastolic flattening. - Right ventricle: The cavity size was mildly dilated. Wall   thickness was normal. Systolic function was moderately reduced.   McConnell&'s sign is present, consistent with pulmonary embolism.   Hypokinesis of the RV  lateral free wall. - Pulmonary arteries: Systolic pressure was moderately increased.   PA peak pressure: 47 mm Hg (S).  Impressions:  - Findings are consistent with moderate to large pulmonary   embolism.  Disposition and Follow-up: Discharge Instructions    Diet - low sodium heart healthy    Complete by:  As directed    Increase activity slowly    Complete by:  As directed        DISPOSITION: home    [place], MD Follow up on 08/10/2016.   Specialty:  Pulmonary Disease Why:  3pm  Contact information: [place] [address] [phone]         Melinda Crutch, MD. Schedule an appointment as soon as possible for a visit in 2 week(s).   Specialty:  Family Medicine Contact information: [place] [address] [phone]        [facility], [place], MD. Schedule an appointment as soon as possible for a visit in 2 week(s).   Specialty:  Hematology and Oncology Contact information: [place] [address] [phone]            Time spent on Discharge: 35 mins   Signed:   RAI,RIPUDEEP M.D. [facility] 06/27/2016, 10:07 AM Pager: [phone]

## 2016-06-27 NOTE — Progress Notes (Signed)
D/C papers gone over with pt. Prescriptions given to pt. IV taken out. PT. States no questions/complaints. Pt. D/c'd successfully via w/c.

## 2016-07-13 ENCOUNTER — Encounter: Payer: Self-pay | Admitting: Hematology and Oncology

## 2016-07-13 ENCOUNTER — Telehealth: Payer: Self-pay | Admitting: Hematology and Oncology

## 2016-07-13 ENCOUNTER — Ambulatory Visit (HOSPITAL_BASED_OUTPATIENT_CLINIC_OR_DEPARTMENT_OTHER): Payer: BC Managed Care – PPO | Admitting: Hematology and Oncology

## 2016-07-13 VITALS — BP 110/58 | HR 110 | Temp 98.1°F | Resp 19 | Wt 226.3 lb

## 2016-07-13 DIAGNOSIS — I2692 Saddle embolus of pulmonary artery without acute cor pulmonale: Secondary | ICD-10-CM | POA: Diagnosis not present

## 2016-07-13 DIAGNOSIS — Z86718 Personal history of other venous thrombosis and embolism: Secondary | ICD-10-CM | POA: Diagnosis not present

## 2016-07-13 DIAGNOSIS — Z8572 Personal history of non-Hodgkin lymphomas: Secondary | ICD-10-CM | POA: Diagnosis not present

## 2016-07-13 DIAGNOSIS — Z8579 Personal history of other malignant neoplasms of lymphoid, hematopoietic and related tissues: Secondary | ICD-10-CM

## 2016-07-13 NOTE — Progress Notes (Signed)
[place] OFFICE PROGRESS NOTE  Patient Care Team: Lona Kettle, MD as PCP - General (Family Medicine) Heath Lark, MD as Consulting Physician (Hematology and Oncology)  SUMMARY OF ONCOLOGIC HISTORY:  Joyce Payne was transferred to my care after her prior physician has left.  I reviewed the patient's records extensive and collaborated the history with the patient. Summary of her history is as follows: This is a woman with stage IIA Hodgkin's lymphoma in remission.  She presented in mid-June of 2011 with a palpable lymph node mass in the right neck and supraclavicular area. She was evaluated by Dr. Nonda Lou at the [facility] in [place]. CT scan showed additional adenopathy in the right paratracheal region. Apparently, PET scanning also showed an abnormal activity in the left hip. An initial staging bone marrow biopsy was negative for lymphoma. It is really not clear whether or not the abnormality in the left hip was really a focus of involvement with Hodgkin's disease. I personally tend to doubt this. She was treated with 6 cycles of ABVD chemotherapy. Due to decline in her oxygen exchange, the bleomycin had to be deleted from the last 2 cycles.  She developed an extensive cellulitis of her right calf and thigh in November of 2011. Although lower extremity Dopplers were negative for clot, it was presumed that this was where the clot started. She was admitted to the hospital. Scanning incidentally showed pulmonary emboli at that time (08/25/10). She was anticoagulated for 1 year.  She completed all planned chemotherapy approximately 09/30/10. Interim scanning after 4 cycles showed a complete response. Subsequent studies done at time she moved to [facility] in June 2012 showed ongoing complete remission. Scans done 02/06/2013 show no evidence for new disease. An asymptomatic lucent lesion in the anterior left femoral head is stable. The patient was discharged from the  clinic in 2016. She was admitted to the hospital with acute bilateral pulmonary emboli, right greater than left with acute right LE DVT WITH EVIDENCE OF RIGHT HEART STRAIN. IR was consulted, patient underwent thrombolysis. The patient did well after thrombolysis and was transferred to regular floor on 9/15. She was placed on xarelto by pulmonology which she has been tolerating well.  INTERVAL HISTORY: Please see below for problem oriented charting. She is doing well with less shortness of breath. She denies chest pain. Her leg swelling has resolved. As I inquired further, the patient had long distance travel between 06/12/2016 to 06/20/2016. She drove to the beach with her wife. In the middle the week of her vacation, the patient has significant bilateral lower extremity edema, right greater than left. Her last trip in April 2017 was not associated with leg swelling or shortness of breath. The patient denies any recent signs or symptoms of bleeding such as spontaneous epistaxis, hematuria or hematochezia.  REVIEW OF SYSTEMS:   Constitutional: Denies fevers, chills or abnormal weight loss Eyes: Denies blurriness of vision Ears, nose, mouth, throat, and face: Denies mucositis or sore throat Cardiovascular: Denies palpitation, chest discomfort or lower extremity swelling Gastrointestinal:  Denies nausea, heartburn or change in bowel habits Skin: Denies abnormal skin rashes Lymphatics: Denies new lymphadenopathy or easy bruising Neurological:Denies numbness, tingling or new weaknesses Behavioral/Psych: Mood is stable, no new changes  All other systems were reviewed with the patient and are negative.  I have reviewed the past medical history, past surgical history, social history and family history with the patient and they are unchanged from previous note.  ALLERGIES:  is allergic  to povidone-iodine.  MEDICATIONS:  Current Outpatient Prescriptions  Medication Sig Dispense Refill  . baclofen  (LIORESAL) 10 MG tablet Take 10-20 mg by mouth 3 (three) times daily.    . celecoxib (CELEBREX) 200 MG capsule Take 200 mg by mouth 2 (two) times daily.    . Cyanocobalamin (B-12 PO) Take 1 tablet by mouth every 3 (three) days.    . diazepam (VALIUM) 2 MG tablet Take 2 mg by mouth 3 (three) times daily.     . DULoxetine (CYMBALTA) 60 MG capsule Take 60 mg by mouth daily.    . fesoterodine (TOVIAZ) 8 MG TB24 tablet Take 8 mg by mouth daily.    . fexofenadine (ALLEGRA) 180 MG tablet Take 180 mg by mouth daily.    . fluticasone (FLONASE) 50 MCG/ACT nasal spray Place 2 sprays into both nostrils daily.    Marland Kitchen HYDROcodone-acetaminophen (NORCO) 10-325 MG tablet Take 1 tablet by mouth every 6 (six) hours as needed for moderate pain.     Marland Kitchen lisdexamfetamine (VYVANSE) 70 MG capsule Take 70 mg by mouth daily.    . Misc Natural Products (COLON CLEANSE) CAPS Take 7 capsules by mouth daily.    [name] Bicarbonate (ZEGERID) 20-1100 MG CAPS capsule Take 1 capsule by mouth daily before breakfast.    . pregabalin (LYRICA) 150 MG capsule Take 150 mg by mouth 3 (three) times daily.    Derrill Memo ON 07/18/2016] rivaroxaban (XARELTO) 20 MG TABS tablet Take 1 tablet (20 mg total) by mouth daily with supper. Start on 07/18/16 30 tablet 6  . Rivaroxaban 15 & 20 MG TBPK Take as directed on package: Start with one '15mg'$  tablet by mouth twice a day with food. On Day 22 (07/18/16), switch to one '20mg'$  tablet once a day with food/supper. 51 each 0  . tiZANidine (ZANAFLEX) 4 MG tablet Take 4 mg by mouth every 6 (six) hours as needed for muscle spasms.    . traZODone (DESYREL) 150 MG tablet Take 150 mg by mouth at bedtime.     No current facility-administered medications for this visit.     PHYSICAL EXAMINATION: ECOG PERFORMANCE STATUS: 1 - Symptomatic but completely ambulatory  Vitals:   07/13/16 1417  BP: (!) 110/58  Pulse: (!) 110  Resp: 19  Temp: 98.1 F (36.7 C)   Filed Weights   07/13/16 1417  Weight: 226  lb 4.8 oz (102.6 kg)    GENERAL:alert, no distress and comfortable. She is mildly obese SKIN: skin color, texture, turgor are normal, no rashes or significant lesions EYES: normal, Conjunctiva are pink and non-injected, sclera clear OROPHARYNX:no exudate, no erythema and lips, buccal mucosa, and tongue normal  NECK: supple, thyroid normal size, non-tender, without nodularity LYMPH:  no palpable lymphadenopathy in the cervical, axillary or inguinal LUNGS: clear to auscultation and percussion with normal breathing effort HEART: regular rate & rhythm and no murmurs and no lower extremity edema ABDOMEN:abdomen soft, non-tender and normal bowel sounds Musculoskeletal:no cyanosis of digits and no clubbing  NEURO: alert & oriented x 3 with fluent speech, no focal motor/sensory deficits  LABORATORY DATA:  I have reviewed the data as listed    Component Value Date/Time   NA 143 06/26/2016 0400   NA 143 05/23/2015 1432   K 3.5 06/26/2016 0400   K 4.2 05/23/2015 1432   CL 116 (H) 06/26/2016 0400   CL 108 (H) 02/06/2013 1022   CO2 24 06/26/2016 0400   CO2 27 05/23/2015 1432   GLUCOSE 126 (H)  06/26/2016 0400   GLUCOSE 98 05/23/2015 1432   GLUCOSE 108 (H) 02/06/2013 1022   BUN 10 06/26/2016 0400   BUN 21.5 05/23/2015 1432   CREATININE 0.62 06/26/2016 0400   CREATININE 0.9 05/23/2015 1432   CALCIUM 8.1 (L) 06/26/2016 0400   CALCIUM 8.6 05/23/2015 1432   PROT 6.3 (L) 05/23/2015 1432   ALBUMIN 3.8 05/23/2015 1432   AST 15 05/23/2015 1432   ALT 20 05/23/2015 1432   ALKPHOS 90 05/23/2015 1432   BILITOT 0.30 05/23/2015 1432   GFRNONAA >60 06/26/2016 0400   GFRAA >60 06/26/2016 0400    No results found for: SPEP, UPEP  Lab Results  Component Value Date   WBC 9.4 06/26/2016   NEUTROABS 4.4 05/23/2015   HGB 12.3 06/26/2016   HCT 39.4 06/26/2016   MCV 103.1 (H) 06/26/2016   PLT 150 06/26/2016      Chemistry      Component Value Date/Time   NA 143 06/26/2016 0400   NA 143  05/23/2015 1432   K 3.5 06/26/2016 0400   K 4.2 05/23/2015 1432   CL 116 (H) 06/26/2016 0400   CL 108 (H) 02/06/2013 1022   CO2 24 06/26/2016 0400   CO2 27 05/23/2015 1432   BUN 10 06/26/2016 0400   BUN 21.5 05/23/2015 1432   CREATININE 0.62 06/26/2016 0400   CREATININE 0.9 05/23/2015 1432      Component Value Date/Time   CALCIUM 8.1 (L) 06/26/2016 0400   CALCIUM 8.6 05/23/2015 1432   ALKPHOS 90 05/23/2015 1432   AST 15 05/23/2015 1432   ALT 20 05/23/2015 1432   BILITOT 0.30 05/23/2015 1432      ASSESSMENT & PLAN:  History of lymphoma Clinically, she has no evidence to suggest recurrence. I told the patient that is no benefit for routine imaging study unless there are signs and symptoms suspicious disease recurrence.  Acute saddle pulmonary embolism without acute cor pulmonale (HCC) 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  I do not see a reason to order excessive testing to screen for thrombophilia disorder as it would not change our management.  Even though she has recurrent provoked blood clots, with recent significant saddle PE with cor pulmonale, I recommend she continues anticoagulation therapy lifelong.   We discussed about various options of anticoagulation therapies including warfarin, low molecular weight heparin such as Lovenox or newer agents such as Rivaroxaban. Some of the risks and benefits discussed including costs involved, the need for monitoring, risks of life-threatening bleeding/hospitalization, reversibility of each agent in the event of bleeding or overdose, safety profile of each drug and taking into account other social issues such as ease of administration of medications, etc. Ultimately, we have made an informed decision for  the patient to continue treatment with Xarelto.  I recommend the patient to use elastic compression stockings at 20-30 mmHg to reduce risks of chronic thrombophlebitis. Finally, at the end of our consultation today, I reinforced the importance of preventive strategies such as avoiding hormonal supplement, avoiding cigarette smoking, keeping up-to-date with screening programs for early cancer detection, frequent ambulation for long distance travel and aggressive DVT prophylaxis in all surgical settings.  I plan to see her back in 6 months for repeat blood work, history, examination and echocardiogram.  Orders Placed This Encounter  Procedures  . CBC with Differential/Platelet    Standing Status:   Future    Standing Expiration Date:   10/13/2017  . Basic metabolic panel    Standing Status:   Future    Standing Expiration Date:   10/13/2017  . ECHOCARDIOGRAM COMPLETE    Standing Status:   Future    Standing Expiration Date:   10/13/2017    Order Specific Question:   Where should this test be performed    Answer:   [facility]    Order Specific Question:   Complete or Limited study?    Answer:   Complete    Order Specific Question:   Does the patient have a known history of hypersensitivity to Perflutren (Chief Technology Officer for echocardiograms - CHECK ALLERGIES)    Answer:   No    Order Specific Question:   ADMINISTER PERFLUTERN    Answer:   ADMINISTER PERFLUTREN    Comments:   no need    Order Specific Question:   Expected Date:    Answer:   6 months    Order Specific Question:   Reason for exam-Echo    Answer:   Pulmonary Embolus  415.19 / I26.99   All questions were answered. The patient knows to call the clinic with any problems, questions or concerns. No barriers to learning was detected. I spent 30 minutes counseling the patient face to face. The total time spent in the appointment was 40 minutes and more than 50% was on counseling and review of test results     Heath Lark, MD 07/13/2016 3:35 PM

## 2016-07-13 NOTE — Assessment & Plan Note (Signed)
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  I do not see a reason to order excessive testing to screen for thrombophilia disorder as it would not change our management.  Even though she has recurrent provoked blood clots, with recent significant saddle PE with cor pulmonale, I recommend she continues anticoagulation therapy lifelong.   We discussed about various options of anticoagulation therapies including warfarin, low molecular weight heparin such as Lovenox or newer agents such as Rivaroxaban. Some of the risks and benefits discussed including costs involved, the need for monitoring, risks of life-threatening bleeding/hospitalization, reversibility of each agent in the event of bleeding or overdose, safety profile of each drug and taking into account other social issues such as ease of administration of medications, etc. Ultimately, we have made an informed decision for the patient to continue treatment with Xarelto.  I recommend the patient to use elastic compression stockings at 20-30 mmHg to reduce risks of chronic thrombophlebitis. Finally, at the end of our consultation today, I reinforced the importance of preventive strategies such as avoiding hormonal supplement, avoiding cigarette smoking, keeping up-to-date with screening programs for early cancer detection, frequent ambulation for long distance travel and aggressive DVT prophylaxis in all surgical settings.  I plan to see her back in 6 months for repeat blood work, history, examination and echocardiogram.

## 2016-07-13 NOTE — Assessment & Plan Note (Signed)
Clinically, she has no evidence to suggest recurrence. I told the patient that is no benefit for routine imaging study unless there are signs and symptoms suspicious disease recurrence.

## 2016-07-13 NOTE — Telephone Encounter (Signed)
GAVE PATIENT AVS REPORT AND APPOINTMENTS FOR April. MESSAGE TO MANAGED CARE (DC) RE ECHO AUTH.

## 2016-07-17 NOTE — Telephone Encounter (Signed)
Spoke with patient re echo at [facility] 01/11/17 @ 10 am. Per managed care [id number]

## 2016-07-25 ENCOUNTER — Emergency Department (HOSPITAL_COMMUNITY): Payer: BC Managed Care – PPO

## 2016-07-25 ENCOUNTER — Emergency Department (HOSPITAL_COMMUNITY)
Admission: EM | Admit: 2016-07-25 | Discharge: 2016-07-26 | Disposition: A | Discharge status: Home / Self Care | Payer: BC Managed Care – PPO | Attending: Emergency Medicine | Admitting: Emergency Medicine

## 2016-07-25 ENCOUNTER — Encounter (HOSPITAL_COMMUNITY): Payer: Self-pay | Admitting: Emergency Medicine

## 2016-07-25 DIAGNOSIS — Z7901 Long term (current) use of anticoagulants: Secondary | ICD-10-CM | POA: Insufficient documentation

## 2016-07-25 DIAGNOSIS — Z87891 Personal history of nicotine dependence: Secondary | ICD-10-CM | POA: Insufficient documentation

## 2016-07-25 DIAGNOSIS — Z8571 Personal history of Hodgkin lymphoma: Secondary | ICD-10-CM | POA: Insufficient documentation

## 2016-07-25 DIAGNOSIS — R51 Headache: Secondary | ICD-10-CM | POA: Insufficient documentation

## 2016-07-25 DIAGNOSIS — Z79899 Other long term (current) drug therapy: Secondary | ICD-10-CM | POA: Diagnosis not present

## 2016-07-25 DIAGNOSIS — R519 Headache, unspecified: Secondary | ICD-10-CM

## 2016-07-25 LAB — CBC WITH DIFFERENTIAL/PLATELET
BASOS PCT: 0 %
Basophils Absolute: 0 10*3/uL (ref 0.0–0.1)
EOS ABS: 0 10*3/uL (ref 0.0–0.7)
EOS PCT: 0 %
HCT: 40.8 % (ref 36.0–46.0)
HEMOGLOBIN: 13.4 g/dL (ref 12.0–15.0)
LYMPHS ABS: 1.5 10*3/uL (ref 0.7–4.0)
Lymphocytes Relative: 18 %
MCH: 32.1 pg (ref 26.0–34.0)
MCHC: 32.8 g/dL (ref 30.0–36.0)
MCV: 97.8 fL (ref 78.0–100.0)
Monocytes Absolute: 0.5 10*3/uL (ref 0.1–1.0)
Monocytes Relative: 6 %
NEUTROS PCT: 76 %
Neutro Abs: 6.6 10*3/uL (ref 1.7–7.7)
PLATELETS: 216 10*3/uL (ref 150–400)
RBC: 4.17 MIL/uL (ref 3.87–5.11)
RDW: 12.6 % (ref 11.5–15.5)
WBC: 8.6 10*3/uL (ref 4.0–10.5)

## 2016-07-25 LAB — BASIC METABOLIC PANEL
Anion gap: 7 (ref 5–15)
BUN: 28 mg/dL — AB (ref 6–20)
CO2: 27 mmol/L (ref 22–32)
CREATININE: 0.58 mg/dL (ref 0.44–1.00)
Calcium: 8.8 mg/dL — ABNORMAL LOW (ref 8.9–10.3)
Chloride: 101 mmol/L (ref 101–111)
Glucose, Bld: 99 mg/dL (ref 65–99)
POTASSIUM: 3.8 mmol/L (ref 3.5–5.1)
SODIUM: 135 mmol/L (ref 135–145)

## 2016-07-25 LAB — APTT: aPTT: 29 seconds (ref 24–36)

## 2016-07-25 LAB — PROTIME-INR
INR: 1.37
Prothrombin Time: 16.9 seconds — ABNORMAL HIGH (ref 11.4–15.2)

## 2016-07-25 MED ORDER — SODIUM CHLORIDE 0.9 % IV BOLUS (SEPSIS)
1000.0000 mL | Freq: Once | INTRAVENOUS | Status: AC
Start: 2016-07-25 — End: 2016-07-26
  Administered 2016-07-25: 1000 mL via INTRAVENOUS

## 2016-07-25 MED ORDER — KETOROLAC TROMETHAMINE 30 MG/ML IJ SOLN
30.0000 mg | Freq: Once | INTRAMUSCULAR | Status: AC
  Administered 2016-07-25: 30 mg via INTRAVENOUS
  Filled 2016-07-25: qty 1

## 2016-07-25 MED ORDER — METOCLOPRAMIDE HCL 5 MG/ML IJ SOLN
10.0000 mg | Freq: Once | INTRAMUSCULAR | Status: AC
  Administered 2016-07-25: 10 mg via INTRAVENOUS
  Filled 2016-07-25: qty 2

## 2016-07-25 MED ORDER — DIPHENHYDRAMINE HCL 50 MG/ML IJ SOLN
25.0000 mg | Freq: Once | INTRAMUSCULAR | Status: AC
  Administered 2016-07-25: 25 mg via INTRAVENOUS
  Filled 2016-07-25: qty 1

## 2016-07-25 MED ORDER — PROCHLORPERAZINE EDISYLATE 5 MG/ML IJ SOLN
10.0000 mg | Freq: Once | INTRAMUSCULAR | Status: AC
  Administered 2016-07-25: 10 mg via INTRAVENOUS
  Filled 2016-07-25: qty 2

## 2016-07-25 NOTE — ED Notes (Signed)
Low BP reported to Dr.Lui

## 2016-07-25 NOTE — ED Notes (Signed)
Patient transported to CT 

## 2016-07-25 NOTE — ED Provider Notes (Signed)
[facility] DEPT Provider Note   CSN: [id number] Arrival date & time: 07/25/16  1720     History   Chief Complaint Chief Complaint  Patient presents with  . Headache  . Nausea  . Gait Problem    HPI Joyce Payne is a 55 y.o. female.  The history is provided by the patient and medical records.  Headache   Associated symptoms include nausea.    55 year old female with history of anxiety, chronic back pain, depression, GERD, neuromuscular disorder, neuropathy, recent saddle embolus with heart strain as well as lower extremity DVT diagnosed 06/24/16 and now on xarelto presenting to the ED with headache.  Patient states prior to discovering her PE, she did have what she thought was a migraine about 2 weeks prior to this in early September. States it was severe throbbing headache with photophobia. She took one of her friends home migraine medications with resolution. States the next day she was completely back to normal. Since being home from the hospital, states she has overall been doing well. Yesterday morning she developed a headache, localized to her frontal region, throbbing in nature. States since onset, headache has been gradually worsening. She reports associated photophobia, nausea, dizziness, difficulty walking, and hallucinations (feels she is reading book and turning page or shaking hands with someone and she is actually just sitting on the couch). Wife, Dannelle, also reports she has been somewhat confused/forgetful and when she walks she seems to be swaying to the left.  She has tried her home medications including norco which she takes for neuropathy without relief.  She has had little oral intake the past 2 days due to pain.  States she has been sleeping to "block out" the pain but does not seem to be getting any better.  No chest pain or SOB.  No fever, chills, or neck pain.  No head injuries or trauma.  Has been taking all meds as directed.  Past Medical History:  Diagnosis  Date  . Anxiety   . Arthritis of knee   . 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 Chronic back pain   . Complication of anesthesia    had woken up during surgery, high tolerance to pain meds and anesthesia  . Depression   . Depression with anxiety 02/16/2012  . GERD (gastroesophageal reflux disease)   . H/O gastric bypass 02/15/2012   6/88  . History of DVT (deep vein thrombosis) NOV 2011- RIGHT LOWER EXTREMITY  . 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 Hx of pulmonary embolus 02/15/2012   Occurred during chemo for Hodgkin's dis 2011  . Impingement syndrome of left shoulder   . Neuromuscular disorder ([place])   . Recurrent binge eating 02/16/2012   Reason for topomax  . S/P gastric bypass   . Sinus infection     Patient Active Problem List   Diagnosis Date Noted  . Demand ischemia ([place])   . Acute saddle pulmonary embolism without acute cor pulmonale ([place]) 06/24/2016  . Iron deficiency 10/20/2012  . Routine general medical examination at a health care facility 07/26/2012  . Other screening mammogram 07/26/2012  . OAB (overactive bladder) 04/28/2012  .  Depression with anxiety 02/16/2012  . History of lymphoma 02/15/2012  . H/O gastric bypass 02/15/2012  . Allergic rhinitis, cause unspecified 01/14/2012  . ANXIETY 11/11/2010  . DEPRESSION 11/11/2010  . Other chronic pain 11/11/2010  . GERD 11/11/2010  . 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 LOW BACK PAIN 11/11/2010    Past Surgical History:  Procedure Laterality Date  . GASTRIC BYPASS  1999  . IR GENERIC HISTORICAL  06/25/2016   IR US GUIDE VASC ACCESS RIGHT 06/25/2016 Sandi Mariscal, MD [facility]  . IR GENERIC HISTORICAL  06/25/2016   IR ANGIOGRAM  SELECTIVE EACH ADDITIONAL VESSEL 06/25/2016 Sandi Mariscal, MD [facility]  . IR GENERIC HISTORICAL  06/25/2016   IR ANGIOGRAM SELECTIVE EACH ADDITIONAL VESSEL 06/25/2016 Sandi Mariscal, MD [facility]  . IR GENERIC HISTORICAL  06/25/2016   IR ANGIOGRAM PULMONARY BILATERAL SELECTIVE 06/25/2016 Sandi Mariscal, MD [facility]  . IR GENERIC HISTORICAL  06/25/2016   IR INFUSION THROMBOL ARTERIAL INITIAL (MS) 06/25/2016 Sandi Mariscal, MD [facility]  . IR GENERIC HISTORICAL  06/25/2016   IR INFUSION THROMBOL ARTERIAL INITIAL (MS) 06/25/2016 Sandi Mariscal, MD [facility]  . IR GENERIC HISTORICAL  06/26/2016   IR THROMB F/U EVAL ART/VEN FINAL DAY (MS) 06/26/2016 Aletta Edouard, MD [facility]  . [place] SURGERY  2009  . ORIF TOE FRACTURE Left 06/27/2015   Procedure: OPEN REDUCTION INTERNAL FIXATION (ORIF) LEFT FOURTH AND FIFTH METATARSAL (TOE) FRACTURE NONUNION;  Surgeon: Wylene Simmer, MD;  Location: [facility];  Service: Orthopedics;  Laterality: Left;  . PATELLA FRACTURE SURGERY    . PORT-A-CATH INSERTION  2011  . SHOULDER CLOSED REDUCTION  01/18/2012   Procedure: CLOSED MANIPULATION SHOULDER;  Surgeon: Johnn Hai, MD;  Location: [facility];  Service: Orthopedics;  Laterality: Left;  . TONSILECTOMY, ADENOIDECTOMY, BILATERAL MYRINGOTOMY AND TUBES  1985    OB History    No data available       Home Medications    Prior to Admission medications   Medication Sig Start Date End Date Taking? Authorizing Provider  baclofen (LIORESAL) 10 MG tablet Take 10-20 mg by mouth 3 (three) times daily.    Historical Provider, MD  celecoxib (CELEBREX) 200 MG capsule Take 200 mg by mouth 2 (two) times daily.    Historical Provider, MD  Cyanocobalamin (B-12 PO) Take 1 tablet by mouth every 3 (three) days.    Historical Provider, MD  diazepam (VALIUM) 2 MG tablet Take 2 mg by mouth 3 (three) times daily.     Historical Provider, MD  DULoxetine (CYMBALTA) 60 MG capsule Take 60 mg by mouth  daily.    Historical Provider, MD  fesoterodine (TOVIAZ) 8 MG TB24 tablet Take 8 mg by mouth daily.    Historical Provider, MD  fexofenadine (ALLEGRA) 180 MG tablet Take 180 mg by mouth daily.    Historical Provider, MD  fluticasone (FLONASE) 50 MCG/ACT nasal spray Place 2 sprays into both nostrils daily.    Historical Provider, MD  HYDROcodone-acetaminophen (NORCO) 10-325 MG tablet Take 1 tablet by mouth every 6 (six) hours as needed for moderate pain.     Historical Provider, MD  lisdexamfetamine (VYVANSE) 70 MG capsule Take 70 mg by mouth daily.    Historical Provider, MD  Misc Natural Products (COLON CLEANSE) CAPS Take 7 capsules by mouth daily.    Historical Provider, MD  Omeprazole-Sodium Bicarbonate (ZEGERID) 20-1100 MG CAPS capsule Take 1 capsule by mouth daily before breakfast.    Historical Provider,  MD  pregabalin (LYRICA) 150 MG capsule Take 150 mg by mouth 3 (three) times daily.    Historical Provider, MD  rivaroxaban (XARELTO) 20 MG TABS tablet Take 1 tablet (20 mg total) by mouth daily with supper. Start on 07/18/16 07/18/16   Ripudeep Krystal Eaton, MD  Rivaroxaban 15 & 20 MG TBPK Take as directed on package: Start with one 15mg  tablet by mouth twice a day with food. On Day 22 (07/18/16), switch to one 20mg  tablet once a day with food/supper. 06/27/16   Ripudeep Krystal Eaton, MD  tiZANidine (ZANAFLEX) 4 MG tablet Take 4 mg by mouth every 6 (six) hours as needed for muscle spasms.    Historical Provider, MD  traZODone (DESYREL) 150 MG tablet Take 150 mg by mouth at bedtime.    Historical Provider, MD    Family History Family History  Problem Relation Age of Onset  . Hypertension Mother   . Hyperlipidemia Mother   . Cancer Father     colon can  . Hypertension Other   . Cancer Other     colon, 1st degree relative<60    Social History Social History  Substance Use Topics  . Smoking status: Former Smoker    Years: 10.00    Types: Cigarettes    Quit date: 10/22/2006  . Smokeless tobacco:  Never Used  . Alcohol use No     Allergies   Povidone-iodine   Review of Systems Review of Systems  Constitutional: Positive for appetite change.  Eyes: Positive for photophobia.  Gastrointestinal: Positive for nausea.  Musculoskeletal: Positive for gait problem.  Neurological: Positive for headaches.  All other systems reviewed and are negative.    Physical Exam Updated Vital Signs BP (!) 102/54   Pulse 86   Temp 97.4 F (36.3 C) (Oral)   Resp 13   Ht 5\' 7"  (1.702 m)   Wt 101.2 kg   LMP 10/25/2012   SpO2 95%   BMI 34.93 kg/m   Physical Exam  Constitutional: She is oriented to person, place, and time. She appears well-developed and well-nourished. No distress.  HENT:  Head: Normocephalic and atraumatic.  Right Ear: External ear normal.  Left Ear: External ear normal.  Mouth/Throat: Oropharynx is clear and moist.  Eyes: Conjunctivae and EOM are normal. Pupils are equal, round, and reactive to light.  EOMs fully intact, no nystagmus, normal confrontation  Neck: Normal range of motion and full passive range of motion without pain. Neck supple. No neck rigidity.  No rigidity, no meningismus  Cardiovascular: Normal rate, regular rhythm and normal heart sounds.   No murmur heard. Pulmonary/Chest: Effort normal and breath sounds normal. No respiratory distress. She has no wheezes. She has no rhonchi.  Abdominal: Soft. Bowel sounds are normal. There is no tenderness. There is no guarding.  Musculoskeletal: Normal range of motion. She exhibits no edema.  Neurological: She is alert and oriented to person, place, and time. She has normal strength. She displays no tremor. No cranial nerve deficit or sensory deficit. She displays no seizure activity.  AAOx3, answering questions and following commands appropriately; equal strength UE and LE bilaterally; CN grossly intact; moves all extremities appropriately without ataxia; no pronator drift; no focal neuro deficits or facial  asymmetry appreciated; speech clear and goal oriented  Skin: Skin is warm and dry. No rash noted. She is not diaphoretic.  Psychiatric: She has a normal mood and affect. Her behavior is normal. Thought content normal.  Nursing note and vitals reviewed.  ED Treatments / Results  Labs (all labs ordered are listed, but only abnormal results are displayed) Labs Reviewed  BASIC METABOLIC PANEL - Abnormal; Notable for the following:       Result Value   BUN 28 (*)    Calcium 8.8 (*)    All other components within normal limits  PROTIME-INR - Abnormal; Notable for the following:    Prothrombin Time 16.9 (*)    All other components within normal limits  CBC WITH DIFFERENTIAL/PLATELET  APTT    EKG  EKG Interpretation None       Radiology Ct Head Wo Contrast  Result Date: 07/25/2016 CLINICAL DATA:  Severe headache and confusion. On blood thinner. History of lymphoma. EXAM: CT HEAD WITHOUT CONTRAST TECHNIQUE: Contiguous axial images were obtained from the base of the skull through the vertex without intravenous contrast. COMPARISON:  CT head 06/24/2016 FINDINGS: Brain: Ventricle size normal. Subtle hypodensity left occipital lobe is unchanged from the prior study. No other abnormality. No acute hemorrhage or infarction. Vascular: No hyperdense vessel or unexpected calcification. Skull: Negative Sinuses/Orbits: Negative Other: None IMPRESSION: Subtle hypodensity left occipital lobe unchanged from the recent CT. This is questionably seen on a CTA neck 08/03/2011 and probably represents area of chronic ischemia. If the patient has symptoms retro bullous area, MRI of the brain without with contrast suggested. Negative for hemorrhage.  No acute infarct. Electronically Signed   By: Franchot Gallo M.D.   On: 07/25/2016 21:16    Procedures Procedures (including critical care time)  Medications Ordered in ED Medications  diphenhydrAMINE (BENADRYL) injection 25 mg (25 mg Intravenous Given  07/25/16 2019)  metoCLOPramide (REGLAN) injection 10 mg (10 mg Intravenous Given 07/25/16 2021)  sodium chloride 0.9 % bolus 1,000 mL (1,000 mLs Intravenous New Bag/Given 07/25/16 2018)  ketorolac (TORADOL) 30 MG/ML injection 30 mg (30 mg Intravenous Given 07/25/16 2152)  prochlorperazine (COMPAZINE) injection 10 mg (10 mg Intravenous Given 07/25/16 2158)  gadobenate dimeglumine (MULTIHANCE) injection 20 mL (20 mLs Intravenous Contrast Given 07/26/16 0045)     Initial Impression / Assessment and Plan / ED Course  I have reviewed the triage vital signs and the nursing notes.  Pertinent labs & imaging results that were available during my care of the patient were reviewed by me and considered in my medical decision making (see chart for details).  Clinical Course   55 year old female here with headache, onset yesterday. This was not thunderclap in onset, not worst headache of her life. States he has been progressively worsening since onset. She does have some atypical symptoms associated (hallucinations) as well as dizziness, photophobia, nausea, and gait disturbance.  Here she is afebrile and nontoxic. Her neurologic exam is nonfocal. She does not have any clinical signs or symptoms concerning for meningitis. Patient with recent saddle embolus and DVT of the leg, now on xarelto.  Will obtain labs, CT scan.    CT negative for acute findings, however there is noted hypodensity of left occipital lobe unchanged from most recent CT.  Discussed with neurology, Dr. Shon Hale-- feels it would be reasonable to obtain MRI/MRV given her recent history.  If negative, no apparent need for hospitalization and may follow-up as an outpatient.  12:59 AM MRI pending, care signed out to oncoming provider.  If negative, patient can be discharged home to follow-up as an outpatient.  She has a neurologist that she sees in [place] per her wife.  Final Clinical Impressions(s) / ED Diagnoses   Final diagnoses:    Headache  New Prescriptions New Prescriptions   No medications on file     Larene Pickett, PA-C 07/26/16 0101    Forde Dandy, MD 07/26/16 [phone]

## 2016-07-25 NOTE — ED Triage Notes (Signed)
Pt c/o severe headache onset yesterday. Pt was admitted to hospital about 3 weeks ago and had a procedure done at that time. Family reports that pt has been unable to do her normal daily activities. Pt reports headache is causing blurred vision and light sensitivity.

## 2016-07-25 NOTE — ED Notes (Signed)
RN attempted to start IV; 2nd RN to start IV  

## 2016-07-25 NOTE — ED Notes (Signed)
EDP at bedside  

## 2016-07-26 MED ORDER — BUTALBITAL-APAP-CAFFEINE 50-325-40 MG PO TABS: 1.0000 | ORAL_TABLET | Freq: Three times a day (TID) | ORAL | 0 refills | Status: DC | PRN

## 2016-07-26 MED ORDER — GADOBENATE DIMEGLUMINE 529 MG/ML IV SOLN
20.0000 mL | Freq: Once | INTRAVENOUS | Status: AC | PRN
  Administered 2016-07-26: 20 mL via INTRAVENOUS

## 2016-07-26 NOTE — ED Provider Notes (Signed)
3:05 AM Patient care assumed from Quincy Carnes at change of shift with MRI and MRV pending. These imaging studies show no acute abnormalities. Patient eating a sandwich and drinking soda without difficulty. She states that she feels better. She is relieved regarding the results of her imaging studies. The patient has a neurologist with whom she can follow-up on an outpatient basis. I have advised primary care follow-up in the interim. Return precautions discussed and provided. Patient discharged in stable condition with no unaddressed concerns.   Vitals:   07/26/16 0119 07/26/16 0130 07/26/16 0202 07/26/16 0230  BP: 103/58 (!) 107/52 97/61 94/60   Pulse: 74 72 75 73  Resp: 15     Temp:      TempSrc:      SpO2: 98% 94% 95% 95%  Weight:      Height:        Mr Jeri Cos [id number] Contrast  Result Date: 07/26/2016 CLINICAL DATA:  55 y/o  F; severe headache with onset yesterday. EXAM: MRI HEAD WITHOUT AND WITH CONTRAST MRV HEAD WITHOUT AND WITH CONTRAST TECHNIQUE: Multiplanar, multiecho pulse sequences of the brain and surrounding structures were obtained without and with intravenous contrast. Angiographic images of the intracranial venous structures were obtained using MRV technique with and without intravenous contrast. COMPARISON:  07/25/2016 CT head. CONTRAST:  55mL MULTIHANCE GADOBENATE DIMEGLUMINE 529 MG/ML IV SOLN FINDINGS: MR HEAD FINDINGS Brain: No acute infarction, hemorrhage, hydrocephalus, extra-axial collection or mass lesion. Vascular: Normal flow voids. Skull and upper cervical spine: Normal marrow signal. Sinuses/Orbits: Negative. Other: None. MRV HEAD FINDINGS Superior sagittal sinus, straight sinus, internal cerebral veins, basal veins of Rosenthal, large cortical veins, transverse sinuses, sigmoid sinuses, and upper internal jugular veins are patent. Diminished flow related signal within the left transverse sinus is likely to represent slow flow. There is normal pre and post enhancement. Small  transverse arachnoid granulations. IMPRESSION: 1. No acute intracranial abnormality is identified. Unremarkable MRI of the brain for age. 2. Patent dural venous sinuses.  No evidence for thrombosis. Electronically Signed   By: Kristine Garbe M.D.   On: 07/26/2016 01:28   Mr Mrv Miguel Dibble [id number] Cm  Result Date: 07/26/2016 CLINICAL DATA:  55 y/o  F; severe headache with onset yesterday. EXAM: MRI HEAD WITHOUT AND WITH CONTRAST MRV HEAD WITHOUT AND WITH CONTRAST TECHNIQUE: Multiplanar, multiecho pulse sequences of the brain and surrounding structures were obtained without and with intravenous contrast. Angiographic images of the intracranial venous structures were obtained using MRV technique with and without intravenous contrast. COMPARISON:  07/25/2016 CT head. CONTRAST:  71mL MULTIHANCE GADOBENATE DIMEGLUMINE 529 MG/ML IV SOLN FINDINGS: MR HEAD FINDINGS Brain: No acute infarction, hemorrhage, hydrocephalus, extra-axial collection or mass lesion. Vascular: Normal flow voids. Skull and upper cervical spine: Normal marrow signal. Sinuses/Orbits: Negative. Other: None. MRV HEAD FINDINGS Superior sagittal sinus, straight sinus, internal cerebral veins, basal veins of Rosenthal, large cortical veins, transverse sinuses, sigmoid sinuses, and upper internal jugular veins are patent. Diminished flow related signal within the left transverse sinus is likely to represent slow flow. There is normal pre and post enhancement. Small transverse arachnoid granulations. IMPRESSION: 1. No acute intracranial abnormality is identified. Unremarkable MRI of the brain for age. 2. Patent dural venous sinuses.  No evidence for thrombosis. Electronically Signed   By: Kristine Garbe M.D.   On: 07/26/2016 01:28     Antonietta Breach, PA-C 07/26/16 [place], DO 07/26/16 [phone]

## 2016-07-26 NOTE — ED Notes (Signed)
Pt understood dc material. NAD noted. Script given at dc  

## 2016-08-10 ENCOUNTER — Other Ambulatory Visit (INDEPENDENT_AMBULATORY_CARE_PROVIDER_SITE_OTHER): Payer: BC Managed Care – PPO

## 2016-08-10 ENCOUNTER — Ambulatory Visit (INDEPENDENT_AMBULATORY_CARE_PROVIDER_SITE_OTHER): Payer: BC Managed Care – PPO | Admitting: Pulmonary Disease

## 2016-08-10 ENCOUNTER — Encounter: Payer: Self-pay | Admitting: Pulmonary Disease

## 2016-08-10 VITALS — BP 128/70 | HR 95 | Ht 67.0 in | Wt 228.0 lb

## 2016-08-10 DIAGNOSIS — I2692 Saddle embolus of pulmonary artery without acute cor pulmonale: Secondary | ICD-10-CM

## 2016-08-10 DIAGNOSIS — R5383 Other fatigue: Secondary | ICD-10-CM | POA: Diagnosis not present

## 2016-08-10 LAB — HEMOGLOBIN: Hemoglobin: 15.8 g/dL — ABNORMAL HIGH (ref 12.0–15.0)

## 2016-08-10 NOTE — Assessment & Plan Note (Signed)
Joyce Payne is progressing as most people would expect post hospitalization for large PE. She still has some dyspnea but in general she feels better than previously. At this point she likely has still some degree of RV strain but deconditioning are probably also contributing significantly to her dyspnea.  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  Headache, I worry that this may be related to the Xarelto though this is not commonly reported. Incidentally, I have seen a number of patients with significant myalgias and arthralgias on Xarelto that resolved which we stopped the medicine.  Plan: Continue Xarelto as prescribed Exercise regularly, gradually increase activity However, if headaches don't improve in 2-4 weeks with the new regimen recommended by her neurologist then we could consider changing to Eliquis Repeat echocardiogram 6 months, sooner if dyspnea worsens  She was given the following instructions: We will call you with the results of the bloodwork Exercise regularly, I recommend that you increase your exercise gradually over the next 2-4 weeks I agree with the plan to repeat an echocardiogram in 6 months We will have you follow-up with either myself or the nurse practitioner in 2-4 weeks, if you're still having headaches at that point then we will consider changing to Eliquis.

## 2016-08-10 NOTE — Progress Notes (Signed)
Subjective:    Patient ID: Joyce Payne, female    DOB: 09/25/1961, 55 y.o.   MRN: [id number]  Synopsis hospitalized in September 2017 for large pulmonary embolism requiring catheter directed thromboliysis. Had a history of DVT/PE in 2011 while she had Hodgkin's lymphoma. Echocardiogram during her September 2017 hospitalization showed RVSP of 47 mmHg.  HPI Chief Complaint  Patient presents with  . Advice Only    Referred by Joyce Payne for P.E.     Joyce Payne" is here to see me for a follow up from her recent hospitalization for a PE at [facility] where she had a large PE and was treated with catheter directed thrombolysis. [place] the beach (4 [place] and developed right ankle swelling and fatigued.  She treated it with a compression stocking and eventually ended up in the ER with severe dyspnea several days later.    Since coming home from the hospital she says that she is still dyspneic with exertion and still feels fatigued.  She has been taking the Xarelto regularly since the discharge.  She has been walking regularly, started with 10 minutes, then 15 minutes a day after that.  She has noticed a twitch in her right eye and has been having headaches ever since she had the clot.  She has been struggling with the headaches for quite sometime.  She actually went back o the ER for this and had an MRI that didn't show a mass, clot or bleeding.  She saw her neurologist last week and has been told to take topamax and a new migraine breaking medicine.  She just started taking this in the last few days.     She has lost 50-60 pounds since last year with a diet routine.     Past Medical History:  Diagnosis Date  . Anxiety   . Arthritis of knee   . 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 Chronic back pain   . Complication of anesthesia    had woken up during surgery, high tolerance to pain meds and anesthesia  . Depression   . Depression with anxiety 02/16/2012  . GERD  (gastroesophageal reflux disease)   . H/O gastric bypass 02/15/2012   6/88  . History of DVT (deep vein thrombosis) NOV 2011- RIGHT LOWER EXTREMITY  . 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 Hodgkin lymphoma ([place]) STAGE II  VERSUS IVa  ------ ONCOLOGIST- DR Joyce Payne   S/P CHEMOTHERAPY---   IN REMISSION FOR 15 MONTHS  . 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 Hx of pulmonary embolus 02/15/2012   Occurred during chemo for Hodgkin's dis 2011  . Impingement syndrome of left shoulder   . Neuromuscular disorder ([place])   . Recurrent binge eating 02/16/2012   Reason for topomax  . S/P gastric bypass   . Sinus infection      Family History  Problem Relation Age of Onset  . Hypertension Mother   . Hyperlipidemia Mother   . Cancer Father     colon can  . Hypertension Other   . Cancer Other     colon, 1st degree relative<60     Social History   Social History  . Marital status: Married    Spouse name: N/A  . Number of children: N/A  . Years of education: N/A   Occupational  History  . Quarry manager Tobacco   Social History Main Topics  . Smoking status: Former Smoker    Packs/day: 1.50    Years: 17.00    Types: Cigarettes    Quit date: 10/22/2006  . Smokeless tobacco: Former Systems developer  . Alcohol use No  . Drug use: No  . Sexual activity: Not Currently   Other Topics Concern  . Not on file   Social History Narrative   Regular exercise- NO     Allergies  Allergen Reactions  . Povidone-Iodine Rash     Outpatient Medications Prior to Visit  Medication Sig Dispense Refill  . baclofen (LIORESAL) 10 MG tablet Take 20 mg by mouth at bedtime.     . butalbital-acetaminophen-caffeine (FIORICET, ESGIC) 50-325-40 MG tablet Take 1-2 tablets by mouth every 8 (eight) hours as needed for headache. 20 tablet 0  . Cholecalciferol (VITAMIN D) 2000 units  CAPS Take 2,000 Units by mouth daily.    . Cyanocobalamin (B-12 PO) Take 1 tablet by mouth every 3 (three) days.    . diazepam (VALIUM) 5 MG tablet Take 5 mg by mouth 3 (three) times daily as needed.   0  . DULoxetine (CYMBALTA) 60 MG capsule Take 60 mg by mouth daily.    . fesoterodine (TOVIAZ) 8 MG TB24 tablet Take 8 mg by mouth daily.    . fexofenadine (ALLEGRA) 180 MG tablet Take 180 mg by mouth daily.    . fluticasone (FLONASE) 50 MCG/ACT nasal spray Place 2 sprays into both nostrils daily.    Marland Kitchen HYDROcodone-acetaminophen (NORCO) 10-325 MG tablet Take 1 tablet by mouth every 6 (six) hours as needed for moderate pain.     Marland Kitchen lisdexamfetamine (VYVANSE) 70 MG capsule Take 70 mg by mouth daily.    . mirabegron ER (MYRBETRIQ) 50 MG TB24 tablet Take 50 mg by mouth daily.    . Misc Natural Products (COLON CLEANSE) CAPS Take 7 capsules by mouth daily.    [name] Bicarbonate (ZEGERID) 20-1100 MG CAPS capsule Take 1 capsule by mouth daily before breakfast.    . pregabalin (LYRICA) 150 MG capsule Take 150 mg by mouth 3 (three) times daily.    . rivaroxaban (XARELTO) 20 MG TABS tablet Take 1 tablet (20 mg total) by mouth daily with supper. Start on 07/18/16 30 tablet 6  . tiZANidine (ZANAFLEX) 4 MG tablet Take 4 mg by mouth every 6 (six) hours as needed for muscle spasms.    . traZODone (DESYREL) 150 MG tablet Take 150 mg by mouth at bedtime.    . cephALEXin (KEFLEX) 500 MG capsule Take 500 mg by mouth 3 (three) times daily.  0  . Rivaroxaban 15 & 20 MG TBPK Take as directed on package: Start with one 15mg  tablet by mouth twice a day with food. On Day 22 (07/18/16), switch to one 20mg  tablet once a day with food/supper. (Patient not taking: Reported on 07/25/2016) 51 each 0   No facility-administered medications prior to visit.       Review of Systems  Constitutional: Negative for fever and unexpected weight change.  HENT: Positive for congestion and postnasal drip. Negative for dental  problem, ear pain, nosebleeds, rhinorrhea, sinus pressure, sneezing, sore throat and trouble swallowing.   Eyes: Negative for redness and itching.  Respiratory: Positive for chest tightness and shortness of breath. Negative for cough and wheezing.   Cardiovascular: Negative for palpitations and leg swelling.  Gastrointestinal: Negative for nausea and vomiting.  Genitourinary: Negative for dysuria.  Musculoskeletal: Negative for joint swelling.  Skin: Negative for rash.  Neurological: Negative for headaches.  Hematological: Does not bruise/bleed easily.  Psychiatric/Behavioral: Negative for dysphoric mood. The patient is not nervous/anxious.        Objective:   Physical Exam Vitals:   08/10/16 1513  BP: 128/70  Pulse: 95  SpO2: 95%  Weight: 228 lb (103.4 kg)  Height: 5\' 7"  (1.702 m)    Gen: obese but well appearing, no acute distress HENT: NCAT, OP clear, neck supple without masses Eyes: PERRL, EOMi Lymph: no cervical lymphadenopathy PULM: CTA B CV: tachy, regular, no mgr, no JVD GI: BS+, soft, nontender, no hsm Derm: no rash or skin breakdown MSK: normal bulk and tone Neuro: A&Ox4, CN II-XII intact, strength 5/5 in all 4 extremities Psyche: normal mood and affect   September 2017 hospital discharge summary reviewed were she was treated for pulmonary embolism and discharged on Xarelto. September 2017 echocardiogram reviewed showing normal LVEF, PA pressure estimate 47 mmHg, moderate reduction of RV function, mild RV dilatation     Assessment & Plan:  Fatigue She complains of fatigue which I think is directly related to deconditioning and the recent PE. However, considering the fact that she is taking Xarelto it's worth checking to ensure she's not anemic.  Plan: Exercise regularly Check CBC  Acute saddle pulmonary embolism without acute cor pulmonale (HCC) Joyce Payne is progressing as most people would expect post hospitalization for large PE. She still has some dyspnea  but in general she feels better than previously. At this point she likely has still some degree of RV strain but deconditioning are probably also contributing significantly to her dyspnea.  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  Headache, I worry that this may be related to the Xarelto though this is not commonly reported. Incidentally, I have seen a number of patients with significant myalgias and arthralgias on Xarelto that resolved which we stopped the medicine.  Plan: Continue Xarelto as prescribed Exercise regularly, gradually increase activity However, if headaches don't improve in 2-4 weeks with the new regimen recommended by her neurologist then we could consider changing to Eliquis Repeat echocardiogram 6 months, sooner if dyspnea worsens  She was given the following instructions: We will call you with the results of the bloodwork Exercise regularly, I recommend that you increase your exercise gradually over the next 2-4 weeks I agree with the plan to repeat an echocardiogram in 6 months We will have you follow-up with either myself or the nurse practitioner in 2-4 weeks, if you're still having headaches at that point then we will consider changing to Eliquis.      Current Outpatient Prescriptions:  .  baclofen (LIORESAL) 10 MG tablet, Take 20 mg by mouth at bedtime. , Disp: , Rfl:  .  butalbital-acetaminophen-caffeine (FIORICET, ESGIC) 50-325-40 MG tablet, Take 1-2 tablets by mouth every 8 (eight) hours as needed for headache., Disp: 20 tablet, Rfl: 0 .  Cholecalciferol (VITAMIN D) 2000 units CAPS, Take 2,000 Units by mouth daily., Disp: , Rfl:  .  Cyanocobalamin (B-12 PO), Take 1 tablet by mouth every 3 (three) days., Disp: , Rfl:  .  diazepam (VALIUM) 5 MG tablet, Take 5 mg by mouth 3 (three) times daily as needed. , Disp: , Rfl: 0 .  DULoxetine (CYMBALTA) 60 MG capsule, Take 60 mg by mouth daily., Disp: ,  Rfl:  .  fesoterodine (TOVIAZ) 8 MG TB24 tablet, Take 8  mg by mouth daily., Disp: , Rfl:  .  fexofenadine (ALLEGRA) 180 MG tablet, Take 180 mg by mouth daily., Disp: , Rfl:  .  fluticasone (FLONASE) 50 MCG/ACT nasal spray, Place 2 sprays into both nostrils daily., Disp: , Rfl:  .  HYDROcodone-acetaminophen (NORCO) 10-325 MG tablet, Take 1 tablet by mouth every 6 (six) hours as needed for moderate pain. , Disp: , Rfl:  .  lisdexamfetamine (VYVANSE) 70 MG capsule, Take 70 mg by mouth daily., Disp: , Rfl:  .  mirabegron ER (MYRBETRIQ) 50 MG TB24 tablet, Take 50 mg by mouth daily., Disp: , Rfl:  .  Misc Natural Products (COLON CLEANSE) CAPS, Take 7 capsules by mouth daily., Disp: , Rfl:  .  Omeprazole-Sodium Bicarbonate (ZEGERID) 20-1100 MG CAPS capsule, Take 1 capsule by mouth daily before breakfast., Disp: , Rfl:  .  pregabalin (LYRICA) 150 MG capsule, Take 150 mg by mouth 3 (three) times daily., Disp: , Rfl:  .  rivaroxaban (XARELTO) 20 MG TABS tablet, Take 1 tablet (20 mg total) by mouth daily with supper. Start on 07/18/16, Disp: 30 tablet, Rfl: 6 .  tiZANidine (ZANAFLEX) 4 MG tablet, Take 4 mg by mouth every 6 (six) hours as needed for muscle spasms., Disp: , Rfl:  .  traZODone (DESYREL) 150 MG tablet, Take 150 mg by mouth at bedtime., Disp: , Rfl:

## 2016-08-10 NOTE — Patient Instructions (Signed)
We will call you with the results of the bloodwork Exercise regularly, I recommend that you increase your exercise gradually over the next 2-4 weeks I agree with the plan to repeat an echocardiogram in 6 months We will have you follow-up with either myself or the nurse practitioner in 2-4 weeks, if you're still having headaches at that point then we will consider changing to Eliquis.

## 2016-08-10 NOTE — Assessment & Plan Note (Signed)
She complains of fatigue which I think is directly related to deconditioning and the recent PE. However, considering the fact that she is taking Xarelto it's worth checking to ensure she's not anemic.  Plan: Exercise regularly Check CBC

## 2016-08-25 ENCOUNTER — Encounter: Payer: Self-pay | Admitting: Pulmonary Disease

## 2016-08-25 ENCOUNTER — Ambulatory Visit (INDEPENDENT_AMBULATORY_CARE_PROVIDER_SITE_OTHER): Payer: BC Managed Care – PPO | Admitting: Pulmonary Disease

## 2016-08-25 VITALS — BP 126/70 | HR 90 | Ht 67.0 in | Wt 228.4 lb

## 2016-08-25 DIAGNOSIS — R5383 Other fatigue: Secondary | ICD-10-CM

## 2016-08-25 DIAGNOSIS — I2692 Saddle embolus of pulmonary artery without acute cor pulmonale: Secondary | ICD-10-CM

## 2016-08-25 DIAGNOSIS — I2602 Saddle embolus of pulmonary artery with acute cor pulmonale: Secondary | ICD-10-CM

## 2016-08-25 MED ORDER — APIXABAN 2.5 MG PO TABS: 5.0000 mg | ORAL_TABLET | Freq: Two times a day (BID) | ORAL | Status: DC

## 2016-08-25 NOTE — Progress Notes (Signed)
Subjective:    Patient ID: Joyce Payne, female    DOB: 12/23/1960, 55 y.o.   MRN: [id number]  Synopsis hospitalized in September 2017 for large pulmonary embolism requiring catheter directed thromboliysis. Had a history of DVT/PE in 2011 while she had Hodgkin's lymphoma. Echocardiogram during her September 2017 hospitalization showed RVSP of 47 mmHg.  HPI Chief Complaint  Patient presents with  . Follow-up    pt states fatigue, headaches are improved slightly but still present.     [name] is still having headaches. She saw a new neurologist and talked about the migraine headaches and she was prescribed a new migraine medicine and was given topamax. Despite these measure she continues to have the headaches.    She still has some fatigue as well.  She is trying to exercise more every day.  She hasn't exercised much yet, maybe just exercising every three days.  Yesterday she did a lot of house work for a long period of time.  This lead to a lot of fatigue.  She feels like her lungs are "tired" after working hard like that.    She continues to take Xarelto daily.   She is supposed to have a home sleep study with Dr. Maxwell Caul in a few weeks to investigate her daytime fatigue.     Past Medical History:  Diagnosis Date  . Anxiety   . Arthritis of knee   . Arthritis of lumbar spine ([place])   . Chronic back pain   . Complication of anesthesia    had woken up during surgery, high tolerance to pain meds and anesthesia  . Depression   . Depression with anxiety 02/16/2012  . GERD (gastroesophageal reflux disease)   . H/O gastric bypass 02/15/2012   6/88  . History of DVT (deep vein thrombosis) NOV 2011- RIGHT LOWER EXTREMITY  . 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 Hodgkin's lymphoma, nodular sclerosis ([place])  02/15/2012   IIA NS Hodgkin's dx 6/11 Rx 6 ABVD in [place]  . Hx of pulmonary embolus 02/15/2012   Occurred during chemo for Hodgkin's dis 2011  . Impingement syndrome of left shoulder   . Neuromuscular disorder ([place])   . Recurrent binge eating 02/16/2012   Reason for topomax  . S/P gastric bypass   . Sinus infection         Review of Systems  Constitutional: Negative for fever and unexpected weight change.  HENT: Negative for congestion, dental problem, ear pain, nosebleeds, postnasal drip, rhinorrhea, sinus pressure, sneezing, sore throat and trouble swallowing.   Eyes: Negative for redness and itching.  Respiratory: Positive for chest tightness and shortness of breath. Negative for cough and wheezing.   Cardiovascular: Negative for palpitations and leg swelling.       Objective:   Physical Exam Vitals:   08/25/16 1604  BP: 126/70  Pulse: 90  SpO2: 100%  Weight: 228 lb 6.4 oz (103.6 kg)  Height: 5\' 7"  (1.702 m)    RA  Gen: obese but well appearing HENT: OP clear, TM's clear, neck supple PULM: CTA B, normal percussion CV: RRR, no mgr, trace edema GI: BS+, soft, nontender Derm: no cyanosis or rash Psyche: normal mood and affect   CBC    Component Value Date/Time   WBC 8.6 07/25/2016  1945   RBC 4.17 07/25/2016 1945   HGB 15.8 (H) 08/10/2016 1612   HGB 14.5 05/23/2015 1432   HCT 40.8 07/25/2016 1945   HCT 43.0 05/23/2015 1432   PLT 216 07/25/2016 1945   PLT 221 05/23/2015 1432   MCV 97.8 07/25/2016 1945   MCV 92.3 05/23/2015 1432   MCH 32.1 07/25/2016 1945   MCHC 32.8 07/25/2016 1945   RDW 12.6 07/25/2016 1945   RDW 13.2 05/23/2015 1432   LYMPHSABS 1.5 07/25/2016 1945   LYMPHSABS 2.1 05/23/2015 1432   MONOABS 0.5 07/25/2016 1945   MONOABS 0.5 05/23/2015 1432   EOSABS 0.0 07/25/2016 1945   EOSABS 0.1 05/23/2015 1432   BASOSABS 0.0 07/25/2016 1945   BASOSABS 0.0 05/23/2015 1432        Assessment & Plan:  Acute saddle pulmonary embolism without  acute cor pulmonale (HCC) She has been doing well and still has some dyspnea and fatigue which is to be expected after a large PE of the size. However, she continues to have headaches which I worry may be related to the Xarelto as they have increased in frequency since taking the Xarelto. She was seen by neurology about 2 weeks ago who has been treating her migraines.  Plan: Repeat echocardiogram in March or April 2018 and follow-up with me after that Stop Xarelto now Start Eliquis 10 mg twice a day for 7 days followed by 5 mg twice a day until she follows up with me. 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 We did discuss the risks of taking blood thinning medicines today and I explained that while the overall risk of a massive bleeding event on Eliquis is low she still need to be cognizant of the fact that her risk of bleeding overall is increased while taking a blood thinning medicine. She voiced understanding and is willing to proceed.  Fatigue  Agree with plan for sleep study     Current Outpatient Prescriptions:  .  baclofen (LIORESAL) 10 MG tablet, Take 20 mg by mouth at bedtime. , Disp: , Rfl:  .  butalbital-acetaminophen-caffeine (FIORICET, ESGIC) 50-325-40 MG tablet, Take 1-2 tablets by mouth every 8 (eight) hours as needed for headache., Disp: 20 tablet, Rfl: 0 .  Cholecalciferol (VITAMIN D) 2000 units CAPS, Take 2,000 Units by mouth daily., Disp: , Rfl:  .  Cyanocobalamin (B-12 PO), Take 1 tablet by mouth every 3 (three) days., Disp: , Rfl:  .  diazepam (VALIUM) 5 MG tablet, Take 5 mg by mouth 3 (three) times daily as needed. , Disp: , Rfl: 0 .  DULoxetine (CYMBALTA) 60 MG capsule, Take 60 mg by mouth daily., Disp: , Rfl:  .  fesoterodine (TOVIAZ) 8 MG TB24 tablet, Take 8 mg by mouth daily., Disp: , Rfl:  .  fexofenadine (ALLEGRA) 180 MG tablet, Take 180 mg by mouth daily., Disp: , Rfl:  .  fluticasone (FLONASE) 50 MCG/ACT nasal spray, Place 2 sprays  into both nostrils daily., Disp: , Rfl:  .  HYDROcodone-acetaminophen (NORCO) 10-325 MG tablet, Take 1 tablet by mouth every 6 (six) hours as needed for moderate pain. , Disp: , Rfl:  .  lisdexamfetamine (VYVANSE) 70 MG capsule, Take 70 mg by mouth daily., Disp: , Rfl:  .  mirabegron ER (MYRBETRIQ) 50 MG TB24 tablet, Take 50 mg by mouth daily., Disp: , Rfl:  .  Misc Natural Products (COLON CLEANSE) CAPS, Take 7 capsules by mouth daily., Disp: , Rfl:  .  Omeprazole-Sodium Bicarbonate (ZEGERID) 20-1100 MG CAPS capsule, Take 1 capsule by mouth daily before breakfast., Disp: , Rfl:  .  pregabalin (LYRICA) 150 MG capsule, Take 150 mg by mouth 3 (three) times daily., Disp: , Rfl:  .  tiZANidine (ZANAFLEX) 4 MG tablet, Take 4 mg by mouth every 6 (six) hours as needed for muscle spasms., Disp: , Rfl:  .  traZODone (DESYREL) 150 MG tablet, Take 150 mg by mouth at bedtime., Disp: , Rfl:   Current Facility-Administered Medications:  .  apixaban (ELIQUIS) tablet 5 mg, 5 mg, Oral, BID, Juanito Doom, MD

## 2016-08-25 NOTE — Patient Instructions (Signed)
Stopped taking Xarelto tonight Tonight start taking Eliquis 7 mg twice a day for 7 days, then take 5 mg twice a day for 7 days We will see you back in April after the echocardiogram Let us know if you have any trouble with headache or bleeding after switching to Eliquis.

## 2016-08-25 NOTE — Assessment & Plan Note (Signed)
She has been doing well and still has some dyspnea and fatigue which is to be expected after a large PE of the size. However, she continues to have headaches which I worry may be related to the Xarelto as they have increased in frequency since taking the Xarelto. She was seen by neurology about 2 weeks ago who has been treating her migraines.  Plan: Repeat echocardiogram in March or April 2018 and follow-up with me after that Stop Xarelto now Start Eliquis 10 mg twice a day for 7 days followed by 5 mg twice a day until she follows up with me. 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 We did discuss the risks of taking blood thinning medicines today and I explained that while the overall risk of a massive bleeding event on Eliquis is low she still need to be cognizant of the fact that her risk of bleeding overall is increased while taking a blood thinning medicine. She voiced understanding and is willing to proceed.

## 2016-08-25 NOTE — Assessment & Plan Note (Signed)
  Agree with plan for sleep study

## 2016-08-26 ENCOUNTER — Telehealth: Payer: Self-pay | Admitting: Pulmonary Disease

## 2016-08-26 NOTE — Telephone Encounter (Signed)
Called and spoke to pt. Pt states the Eliquis was not called into pharmacy on 11.14.17. The pt's instructions and the plan in the pt's OV note read differently. Pt states she will not have time to pick this up tonight she will get it tomorrow 11.16.17. Will clarify rx with BQ then send in rx, pt aware this will be done tomorrow 11.16.17.   Dr. Lake Bells please advise which strength of Eliquis you would like. Thanks.    -------------------------------------------------------- Plan: Repeat echocardiogram in March or April 2018 and follow-up with me after that Stop Xarelto now Start Eliquis 10 mg twice a day for 7 days followed by 5 mg twice a day until she follows up with me. 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 We did discuss the risks of taking blood thinning medicines today and I explained that while the overall risk of a massive bleeding event on Eliquis is low she still need to be cognizant of the fact that her risk of bleeding overall is increased while taking a blood thinning medicine. She voiced understanding and is willing to proceed.   OV instructions: Stopped taking Xarelto tonight Tonight start taking Eliquis 7 mg twice a day for 7 days, then take 5 mg twice a day for 7 days We will see you back in April after the echocardiogram Let us know if you have any trouble with headache or bleeding after switching to Eliquis

## 2016-08-27 MED ORDER — APIXABAN 5 MG PO TABS: 5.0000 mg | ORAL_TABLET | Freq: Two times a day (BID) | ORAL | 11 refills | Status: DC

## 2016-08-27 NOTE — Telephone Encounter (Signed)
Patient called to see if RX has been sent.  She spoke with pharmacy around 12:15 pm and they do not have the RX yet.  [facility] [address]. Please call when this has been sent over. CB is [phone].

## 2016-08-27 NOTE — Telephone Encounter (Signed)
I sent it electronically.  Typo instructions.  Instructions should read 10mg  bid x 7 days, then 5mg  bid after that indefinitely Thanks for that

## 2016-08-27 NOTE — Telephone Encounter (Signed)
Per pt's chart, the Rx was sent as a clinic administered medication, rather than outpatient Rx [facility] and spoke with pharmacist Suezanne Jacquet - verbal given for Eliquis 5mg  > 2 tabs twice daily x7 days, then 1 tab BID thereafter.  #60 with 11 refills.  Med list updated.  Called spoke with patient, apologized for the confusion and discussed the above with her.  Pt okay with this and voiced her understanding.  Nothing further needed at this time; will sign off.

## 2016-09-24 ENCOUNTER — Telehealth: Payer: Self-pay | Admitting: Pulmonary Disease

## 2016-09-24 NOTE — Telephone Encounter (Signed)
Spoke with the pt and notified of recs per BQ  She verbalized understanding  Nothing further needed 

## 2016-09-24 NOTE — Telephone Encounter (Signed)
Spoke with pt, states X3 weeks she has noticed hair loss- hair will come out in clumps as she is washing or brushing it, will notice excessive fallout throughout the day on her shirt.  Pt is unsure if this could be coming from Eliquis.    BQ please advise.  Thanks.

## 2016-09-24 NOTE — Telephone Encounter (Signed)
Not sure, have her check with her pharmacist

## 2016-09-27 ENCOUNTER — Encounter (HOSPITAL_COMMUNITY): Payer: Self-pay | Admitting: *Deleted

## 2016-09-27 ENCOUNTER — Emergency Department (HOSPITAL_COMMUNITY): Payer: BC Managed Care – PPO

## 2016-09-27 ENCOUNTER — Observation Stay (HOSPITAL_COMMUNITY)
Admission: EM | Admit: 2016-09-27 | Discharge: 2016-09-28 | Disposition: A | Discharge status: Home / Self Care | Payer: BC Managed Care – PPO | Attending: Internal Medicine | Admitting: Internal Medicine

## 2016-09-27 DIAGNOSIS — Z9221 Personal history of antineoplastic chemotherapy: Secondary | ICD-10-CM | POA: Diagnosis not present

## 2016-09-27 DIAGNOSIS — F418 Other specified anxiety disorders: Secondary | ICD-10-CM | POA: Diagnosis not present

## 2016-09-27 DIAGNOSIS — Z7901 Long term (current) use of anticoagulants: Secondary | ICD-10-CM | POA: Diagnosis not present

## 2016-09-27 DIAGNOSIS — Z8572 Personal history of non-Hodgkin lymphomas: Secondary | ICD-10-CM | POA: Diagnosis not present

## 2016-09-27 DIAGNOSIS — I2782 Chronic pulmonary embolism: Secondary | ICD-10-CM | POA: Diagnosis not present

## 2016-09-27 DIAGNOSIS — R4182 Altered mental status, unspecified: Secondary | ICD-10-CM | POA: Diagnosis present

## 2016-09-27 DIAGNOSIS — G9340 Encephalopathy, unspecified: Secondary | ICD-10-CM | POA: Diagnosis not present

## 2016-09-27 DIAGNOSIS — Z86718 Personal history of other venous thrombosis and embolism: Secondary | ICD-10-CM | POA: Insufficient documentation

## 2016-09-27 DIAGNOSIS — R7989 Other specified abnormal findings of blood chemistry: Secondary | ICD-10-CM

## 2016-09-27 DIAGNOSIS — Z86711 Personal history of pulmonary embolism: Secondary | ICD-10-CM | POA: Insufficient documentation

## 2016-09-27 DIAGNOSIS — K219 Gastro-esophageal reflux disease without esophagitis: Secondary | ICD-10-CM | POA: Diagnosis not present

## 2016-09-27 DIAGNOSIS — Z9884 Bariatric surgery status: Secondary | ICD-10-CM | POA: Diagnosis not present

## 2016-09-27 DIAGNOSIS — G8929 Other chronic pain: Secondary | ICD-10-CM | POA: Diagnosis not present

## 2016-09-27 DIAGNOSIS — M549 Dorsalgia, unspecified: Secondary | ICD-10-CM | POA: Diagnosis not present

## 2016-09-27 DIAGNOSIS — R5383 Other fatigue: Secondary | ICD-10-CM

## 2016-09-27 DIAGNOSIS — Z8571 Personal history of Hodgkin lymphoma: Secondary | ICD-10-CM | POA: Diagnosis not present

## 2016-09-27 DIAGNOSIS — Z87891 Personal history of nicotine dependence: Secondary | ICD-10-CM | POA: Diagnosis not present

## 2016-09-27 HISTORY — DX: Other reaction to spinal and lumbar puncture: G97.1

## 2016-09-27 LAB — BLOOD GAS, ARTERIAL
Acid-base deficit: 1.1 mmol/L (ref 0.0–2.0)
Bicarbonate: 23.2 mmol/L (ref 20.0–28.0)
DRAWN BY: 283381
FIO2: 0.21
O2 Saturation: 96.8 %
PATIENT TEMPERATURE: 98.6
PO2 ART: 87.7 mmHg (ref 83.0–108.0)
pCO2 arterial: 39.4 mmHg (ref 32.0–48.0)
pH, Arterial: 7.387 (ref 7.350–7.450)

## 2016-09-27 LAB — I-STAT ARTERIAL BLOOD GAS, ED
ACID-BASE DEFICIT: 5 mmol/L — AB (ref 0.0–2.0)
Bicarbonate: 22.9 mmol/L (ref 20.0–28.0)
O2 SAT: 96 %
PO2 ART: 92 mmHg (ref 83.0–108.0)
Patient temperature: 97.8
TCO2: 25 mmol/L (ref 0–100)
pCO2 arterial: 51.9 mmHg — ABNORMAL HIGH (ref 32.0–48.0)
pH, Arterial: 7.251 — ABNORMAL LOW (ref 7.350–7.450)

## 2016-09-27 LAB — COMPREHENSIVE METABOLIC PANEL
ALBUMIN: 3.7 g/dL (ref 3.5–5.0)
ALT: 21 U/L (ref 14–54)
ANION GAP: 8 (ref 5–15)
AST: 30 U/L (ref 15–41)
Alkaline Phosphatase: 69 U/L (ref 38–126)
BUN: 32 mg/dL — ABNORMAL HIGH (ref 6–20)
CO2: 22 mmol/L (ref 22–32)
Calcium: 9.2 mg/dL (ref 8.9–10.3)
Chloride: 112 mmol/L — ABNORMAL HIGH (ref 101–111)
Creatinine, Ser: 1.02 mg/dL — ABNORMAL HIGH (ref 0.44–1.00)
GFR calc non Af Amer: >60 mL/min (ref >60)
Glucose, Bld: 99 mg/dL (ref 65–99)
POTASSIUM: 4 mmol/L (ref 3.5–5.1)
SODIUM: 142 mmol/L (ref 135–145)
Total Bilirubin: 0.4 mg/dL (ref 0.3–1.2)
Total Protein: 6 g/dL — ABNORMAL LOW (ref 6.5–8.1)

## 2016-09-27 LAB — URINALYSIS, ROUTINE W REFLEX MICROSCOPIC
Bilirubin Urine: NEGATIVE
GLUCOSE, UA: NEGATIVE mg/dL
Hgb urine dipstick: NEGATIVE
KETONES UR: NEGATIVE mg/dL
LEUKOCYTES UA: NEGATIVE
NITRITE: NEGATIVE
PH: 5 (ref 5.0–8.0)
PROTEIN: NEGATIVE mg/dL
Specific Gravity, Urine: 1.009 (ref 1.005–1.030)

## 2016-09-27 LAB — CBC WITH DIFFERENTIAL/PLATELET
BASOS PCT: 0 %
Basophils Absolute: 0 10*3/uL (ref 0.0–0.1)
EOS ABS: 0.1 10*3/uL (ref 0.0–0.7)
EOS PCT: 1 %
HCT: 40.5 % (ref 36.0–46.0)
Hemoglobin: 13.4 g/dL (ref 12.0–15.0)
LYMPHS ABS: 1.8 10*3/uL (ref 0.7–4.0)
Lymphocytes Relative: 24 %
MCH: 31.3 pg (ref 26.0–34.0)
MCHC: 33.1 g/dL (ref 30.0–36.0)
MCV: 94.6 fL (ref 78.0–100.0)
MONO ABS: 0.5 10*3/uL (ref 0.1–1.0)
MONOS PCT: 6 %
NEUTROS PCT: 69 %
Neutro Abs: 5 10*3/uL (ref 1.7–7.7)
PLATELETS: 216 10*3/uL (ref 150–400)
RBC: 4.28 MIL/uL (ref 3.87–5.11)
RDW: 13.7 % (ref 11.5–15.5)
WBC: 7.4 10*3/uL (ref 4.0–10.5)

## 2016-09-27 LAB — TROPONIN I

## 2016-09-27 LAB — VITAMIN B12: Vitamin B-12: 2224 pg/mL — ABNORMAL HIGH (ref 180–914)

## 2016-09-27 LAB — RAPID URINE DRUG SCREEN, HOSP PERFORMED
Amphetamines: POSITIVE — AB
BARBITURATES: NOT DETECTED
Benzodiazepines: POSITIVE — AB
COCAINE: NOT DETECTED
OPIATES: POSITIVE — AB
TETRAHYDROCANNABINOL: NOT DETECTED

## 2016-09-27 LAB — FOLATE: FOLATE: 27.4 ng/mL (ref >5.9)

## 2016-09-27 LAB — IRON AND TIBC
IRON: 45 ug/dL (ref 28–170)
Saturation Ratios: 17 % (ref 10.4–31.8)
TIBC: 272 ug/dL (ref 250–450)
UIBC: 227 ug/dL

## 2016-09-27 LAB — AMMONIA: Ammonia: 58 umol/L — ABNORMAL HIGH (ref 9–35)

## 2016-09-27 LAB — SALICYLATE LEVEL: Salicylate Lvl: <7 mg/dL (ref 2.8–30.0)

## 2016-09-27 LAB — RETICULOCYTES
RBC.: 4.08 MIL/uL (ref 3.87–5.11)
RETIC COUNT ABSOLUTE: 73.4 10*3/uL (ref 19.0–186.0)
Retic Ct Pct: 1.8 % (ref 0.4–3.1)

## 2016-09-27 LAB — ACETAMINOPHEN LEVEL

## 2016-09-27 LAB — CBG MONITORING, ED: Glucose-Capillary: 78 mg/dL (ref 65–99)

## 2016-09-27 LAB — FERRITIN: Ferritin: 25 ng/mL (ref 11–307)

## 2016-09-27 LAB — ETHANOL: Alcohol, Ethyl (B): <5 mg/dL (ref <5)

## 2016-09-27 LAB — CK: CK TOTAL: 257 U/L — AB (ref 38–234)

## 2016-09-27 LAB — TSH: TSH: 0.996 u[IU]/mL (ref 0.350–4.500)

## 2016-09-27 LAB — I-STAT CG4 LACTIC ACID, ED: Lactic Acid, Venous: 1.32 mmol/L (ref 0.5–1.9)

## 2016-09-27 MED ORDER — TRAZODONE HCL 50 MG PO TABS: 150.0000 mg | ORAL_TABLET | Freq: Every day | ORAL | Status: DC

## 2016-09-27 MED ORDER — LACTULOSE 10 GM/15ML PO SOLN
20.0000 g | Freq: Two times a day (BID) | ORAL | Status: DC
  Administered 2016-09-27 – 2016-09-28 (×3): 20 g via ORAL
  Filled 2016-09-27 (×3): qty 30

## 2016-09-27 MED ORDER — FLUTICASONE PROPIONATE 50 MCG/ACT NA SUSP: 2.0000 | Freq: Every day | NASAL | Status: DC | PRN

## 2016-09-27 MED ORDER — NALOXONE HCL 0.4 MG/ML IJ SOLN
0.4000 mg | Freq: Once | INTRAMUSCULAR | Status: AC
  Administered 2016-09-27: 0.4 mg via INTRAVENOUS
  Filled 2016-09-27: qty 1

## 2016-09-27 MED ORDER — FUROSEMIDE 10 MG/ML IJ SOLN
20.0000 mg | Freq: Once | INTRAMUSCULAR | Status: AC
  Administered 2016-09-27: 20 mg via INTRAVENOUS
  Filled 2016-09-27: qty 2

## 2016-09-27 MED ORDER — SODIUM CHLORIDE 0.9 % IV BOLUS (SEPSIS)
1000.0000 mL | Freq: Once | INTRAVENOUS | Status: AC
  Administered 2016-09-27: 1000 mL via INTRAVENOUS

## 2016-09-27 MED ORDER — PANTOPRAZOLE SODIUM 40 MG PO TBEC
80.0000 mg | DELAYED_RELEASE_TABLET | Freq: Every day | ORAL | Status: DC
  Administered 2016-09-27 – 2016-09-28 (×2): 80 mg via ORAL
  Filled 2016-09-27 (×2): qty 2

## 2016-09-27 MED ORDER — MIRABEGRON ER 25 MG PO TB24
50.0000 mg | ORAL_TABLET | Freq: Every day | ORAL | Status: DC
  Administered 2016-09-27 – 2016-09-28 (×2): 50 mg via ORAL
  Filled 2016-09-27 (×2): qty 2

## 2016-09-27 MED ORDER — APIXABAN 5 MG PO TABS
5.0000 mg | ORAL_TABLET | Freq: Two times a day (BID) | ORAL | Status: DC
  Administered 2016-09-27 – 2016-09-28 (×3): 5 mg via ORAL
  Filled 2016-09-27 (×3): qty 1

## 2016-09-27 MED ORDER — CELECOXIB 200 MG PO CAPS
200.0000 mg | ORAL_CAPSULE | Freq: Two times a day (BID) | ORAL | Status: DC
  Administered 2016-09-27 – 2016-09-28 (×3): 200 mg via ORAL
  Filled 2016-09-27 (×3): qty 1

## 2016-09-27 MED ORDER — FESOTERODINE FUMARATE ER 8 MG PO TB24
8.0000 mg | ORAL_TABLET | Freq: Every day | ORAL | Status: DC
  Administered 2016-09-27 – 2016-09-28 (×2): 8 mg via ORAL
  Filled 2016-09-27 (×2): qty 1

## 2016-09-27 MED ORDER — TOPIRAMATE 25 MG PO TABS: 50.0000 mg | ORAL_TABLET | Freq: Every day | ORAL | Status: DC

## 2016-09-27 MED ORDER — PREGABALIN 75 MG PO CAPS: 150.0000 mg | ORAL_CAPSULE | Freq: Three times a day (TID) | ORAL | Status: DC

## 2016-09-27 MED ORDER — DULOXETINE HCL 60 MG PO CPEP: 60.0000 mg | ORAL_CAPSULE | Freq: Every day | ORAL | Status: DC

## 2016-09-27 MED ORDER — VITAMIN D 1000 UNITS PO TABS
2000.0000 [IU] | ORAL_TABLET | Freq: Every day | ORAL | Status: DC
  Administered 2016-09-27 – 2016-09-28 (×2): 2000 [IU] via ORAL
  Filled 2016-09-27 (×2): qty 2

## 2016-09-27 MED ORDER — INFLUENZA VAC SPLIT QUAD 0.5 ML IM SUSY
0.5000 mL | PREFILLED_SYRINGE | INTRAMUSCULAR | Status: AC
  Administered 2016-09-28: 0.5 mL via INTRAMUSCULAR
  Filled 2016-09-27: qty 0.5

## 2016-09-27 MED ORDER — BACLOFEN 5 MG HALF TABLET: 20.0000 mg | ORAL_TABLET | Freq: Every day | ORAL | Status: DC

## 2016-09-27 NOTE — ED Triage Notes (Signed)
Pt from home via [facility]. Per EMS pt wife stated pt fell off toilet. No injuries noted. Family reports pt has been lethargic since yesterday and slow 2 respond. Pt A&O x4 on arrival, but still lethargic. No c/o pain @ this time.

## 2016-09-27 NOTE — ED Notes (Signed)
Pt transported to CT ?

## 2016-09-27 NOTE — Progress Notes (Signed)
Patient seen and examined this morning, H&P reviewed, agree with A/P  55 y.o. female with medical history significant of anxiety, depression, chronic back pain, patient on numerous sedating meds at baseline.  Patient brought in to ED with lethargy and altered mental status x 2 days. She states that she is taking Eliquis regularly. EMS report states that pt fell from toilet while seated. Pt c/o neck pain and confirms taking mood altering medications. She denies headache, chest pain, back pain, medication overdose, Hx of heart problems and fever.  Patient admitted with acute encephalopathy in the setting of polypharmacy at home. She is improving today. She was slightly acidotic and hypercarbic on admission, will repeat an ABG now that she is more awake. She was just diagnosed with OSA and is supposed to received her CPAP in 2-3 days. She had elevated ammonia level without known liver disease, will give lactulose, perhaps due to narcotics. Obtain RUQ [place] to evaluate liver texture. She is not a drinker.   Also complains of hair loss, will check TSH and iron studies.   Discussed extensively at bedside with patient and her wife, advised to check with patient's PCP to discuss her medications and see if there are ways to cut back on benzodiazepine/narcotic/muscle relaxer/stimulants combo.   Anticipate d/c home tomorrow if mental status returns to baseline  Costin M. Cruzita Lederer, MD [facility] [phone]

## 2016-09-27 NOTE — ED Provider Notes (Signed)
[facility] DEPT Provider Note   CSN: [id number] Arrival date & time: 09/27/16  [id number]   By signing my name below, I, Eunice Blase, attest that this documentation has been prepared under the direction and in the presence of Ezequiel Essex, MD. Electronically signed, Eunice Blase, ED Scribe. 09/27/16. 1:06 AM.   History   Chief Complaint Chief Complaint  Patient presents with  . Fall  . Fatigue  LEVEL 5 CAVEAT DUE TO ALTERED MENTAL STATUS  The history is provided by the patient, the EMS personnel and medical records. The history is limited by the condition of the patient. No language interpreter was used.    HPI Comments: Joyce Payne is a 55 y.o. female brought in by EMS who presents to the Emergency Department with lethargy and altered mental status x 2 days. She states that she is taking Eliquis regularly. EMS report states that pt fell from toilet while seated. Pt c/o neck pain and confirms taking mood altering medications. She denies headache, chest pain, back pain, medication overdose, Hx of heart problems and fever.   Past Medical History:  Diagnosis Date  . Anxiety   . Arthritis of knee   . 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 Chronic back pain   . Complication of anesthesia    had woken up during surgery, high tolerance to pain meds and anesthesia  . Depression   . Depression with anxiety 02/16/2012  . GERD (gastroesophageal reflux disease)   . H/O gastric bypass 02/15/2012   6/88  . History of DVT (deep vein thrombosis) NOV 2011- RIGHT LOWER EXTREMITY  . 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 Hx of pulmonary embolus 02/15/2012   Occurred during chemo for Hodgkin's dis  2011  . Impingement syndrome of left shoulder   . Neuromuscular disorder ([place])   . Recurrent binge eating 02/16/2012   Reason for topomax  . S/P gastric bypass   . Sinus infection     Patient Active Problem List   Diagnosis Date Noted  . Fatigue 08/10/2016  . Demand ischemia ([place])   . Acute saddle pulmonary embolism without acute cor pulmonale ([place]) 06/24/2016  . Iron deficiency 10/20/2012  . Routine general medical examination at a health care facility 07/26/2012  . Other screening mammogram 07/26/2012  . OAB (overactive bladder) 04/28/2012  . Depression with anxiety 02/16/2012  . History of lymphoma 02/15/2012  . H/O gastric bypass 02/15/2012  . Allergic rhinitis, cause unspecified 01/14/2012  . ANXIETY 11/11/2010  . DEPRESSION 11/11/2010  . Other chronic pain 11/11/2010  . GERD 11/11/2010  . 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 LOW BACK PAIN 11/11/2010    Past Surgical History:  Procedure Laterality Date  . GASTRIC BYPASS  1999  . IR GENERIC HISTORICAL  06/25/2016   IR US GUIDE VASC ACCESS RIGHT 06/25/2016 Sandi Mariscal, MD [facility]  . IR GENERIC HISTORICAL  06/25/2016   IR ANGIOGRAM SELECTIVE EACH ADDITIONAL VESSEL 06/25/2016 Sandi Mariscal, MD [facility]  . IR GENERIC HISTORICAL  06/25/2016   IR ANGIOGRAM SELECTIVE EACH ADDITIONAL VESSEL 06/25/2016 Sandi Mariscal, MD [facility]  . IR GENERIC HISTORICAL  06/25/2016   IR ANGIOGRAM  PULMONARY BILATERAL SELECTIVE 06/25/2016 Sandi Mariscal, MD [facility]  . IR GENERIC HISTORICAL  06/25/2016   IR INFUSION THROMBOL ARTERIAL INITIAL (MS) 06/25/2016 Sandi Mariscal, MD [facility]  . IR GENERIC HISTORICAL  06/25/2016   IR INFUSION THROMBOL ARTERIAL INITIAL (MS) 06/25/2016 Sandi Mariscal, MD [facility]  . IR GENERIC HISTORICAL  06/26/2016   IR THROMB F/U EVAL ART/VEN FINAL DAY (MS) 06/26/2016 Aletta Edouard, MD [facility]  . [place] SURGERY  2009  . ORIF TOE FRACTURE Left 06/27/2015   Procedure: OPEN REDUCTION INTERNAL FIXATION (ORIF) LEFT FOURTH  AND FIFTH METATARSAL (TOE) FRACTURE NONUNION;  Surgeon: Wylene Simmer, MD;  Location: [facility];  Service: Orthopedics;  Laterality: Left;  . PATELLA FRACTURE SURGERY    . PORT-A-CATH INSERTION  2011  . SHOULDER CLOSED REDUCTION  01/18/2012   Procedure: CLOSED MANIPULATION SHOULDER;  Surgeon: Johnn Hai, MD;  [facility];  Service: Orthopedics;  Laterality: Left;  . TONSILECTOMY, ADENOIDECTOMY, BILATERAL MYRINGOTOMY AND TUBES  1985    OB History    No data available       Home Medications    Prior to Admission medications   Medication Sig Start Date End Date Taking? Authorizing Provider  apixaban (ELIQUIS) 5 MG TABS tablet Take 1 tablet (5 mg total) by mouth 2 (two) times daily. 08/27/16   Juanito Doom, MD  baclofen (LIORESAL) 10 MG tablet Take 20 mg by mouth at bedtime.     Historical Provider, MD  butalbital-acetaminophen-caffeine (FIORICET, ESGIC) 50-325-40 MG tablet Take 1-2 tablets by mouth every 8 (eight) hours as needed for headache. 07/26/16 07/26/17  Antonietta Breach, PA-C  Cholecalciferol (VITAMIN D) 2000 units CAPS Take 2,000 Units by mouth daily.    Historical Provider, MD  Cyanocobalamin (B-12 PO) Take 1 tablet by mouth every 3 (three) days.    Historical Provider, MD  diazepam (VALIUM) 5 MG tablet Take 5 mg by mouth 3 (three) times daily as needed.  07/22/16   Historical Provider, MD  DULoxetine (CYMBALTA) 60 MG capsule Take 60 mg by mouth daily.    Historical Provider, MD  fesoterodine (TOVIAZ) 8 MG TB24 tablet Take 8 mg by mouth daily.    Historical Provider, MD  fexofenadine (ALLEGRA) 180 MG tablet Take 180 mg by mouth daily.    Historical Provider, MD  fluticasone (FLONASE) 50 MCG/ACT nasal spray Place 2 sprays into both nostrils daily.    Historical Provider, MD  HYDROcodone-acetaminophen (NORCO) 10-325 MG tablet Take 1 tablet by mouth every 6 (six) hours as needed for moderate pain.     Historical Provider, MD    lisdexamfetamine (VYVANSE) 70 MG capsule Take 70 mg by mouth daily.    Historical Provider, MD  mirabegron ER (MYRBETRIQ) 50 MG TB24 tablet Take 50 mg by mouth daily.    Historical Provider, MD  Misc Natural Products (COLON CLEANSE) CAPS Take 7 capsules by mouth daily.    Historical Provider, MD  Omeprazole-Sodium Bicarbonate (ZEGERID) 20-1100 MG CAPS capsule Take 1 capsule by mouth daily before breakfast.    Historical Provider, MD  pregabalin (LYRICA) 150 MG capsule Take 150 mg by mouth 3 (three) times daily.    Historical Provider, MD  tiZANidine (ZANAFLEX) 4 MG tablet Take 4 mg by mouth every 6 (six) hours as needed for muscle spasms.    Historical Provider, MD  traZODone (DESYREL) 150 MG tablet Take 150 mg by mouth at bedtime.    Historical Provider, MD    [facility]  History Family History  Problem Relation Age of Onset  . Hypertension Mother   . Hyperlipidemia Mother   . Cancer Father     colon can  . Hypertension Other   . Cancer Other     colon, 1st degree relative<60    Social History Social History  Substance Use Topics  . Smoking status: Former Smoker    Packs/day: 1.50    Years: 17.00    Types: Cigarettes    Quit date: 10/22/2006  . Smokeless tobacco: Former Systems developer  . Alcohol use No     Allergies   Povidone-iodine   Review of Systems Review of Systems  Unable to perform ROS: Mental status change  All other systems reviewed and are negative.    Physical Exam Updated Vital Signs BP (!) 105/47 (BP Location: Right Arm)   Pulse 78   Temp 97.6 F (36.4 C) (Oral)   Resp 18   Ht 5\' 7"  (1.702 m)   Wt 233 lb 4.8 oz (105.8 kg)   LMP 10/25/2012   SpO2 100%   BMI 36.54 kg/m   Physical Exam  Constitutional: She is oriented to person, place, and time. She appears well-developed and well-nourished. No distress.  Sleepy, arouses to voice. Answers questions appropriately  HENT:  Head: Normocephalic and atraumatic.  Mouth/Throat: Oropharynx is clear and moist. No  oropharyngeal exudate.  Eyes: Conjunctivae and EOM are normal. Pupils are equal, round, and reactive to light.  Neck: Normal range of motion. Neck supple.  No meningismus.  Cardiovascular: Normal rate, regular rhythm, normal heart sounds and intact distal pulses.   No murmur heard. Pulmonary/Chest: Effort normal and breath sounds normal. No respiratory distress.  Abdominal: Soft. There is no tenderness. There is no rebound and no guarding.  Musculoskeletal: Normal range of motion. She exhibits no edema or tenderness.  Neurological: She is alert and oriented to person, place, and time. She exhibits normal muscle tone. Coordination normal.   5/5 strength throughout. CN 2-12 intact.Equal grip strength. Speech slow, deliberate and mildly slurred. Gait not tested. No pronator drift. Pupils 62mm and sluggish. Diffuse paraspinal c-spine pain.  Skin: Skin is warm. Capillary refill takes less than 2 seconds.  Psychiatric: She has a normal mood and affect. Her behavior is normal.  Nursing note and vitals reviewed.    ED Treatments / Results  DIAGNOSTIC STUDIES: Oxygen Saturation is 98% on RA, adequate by my interpretation.    COORDINATION OF CARE: 1:05 AM Discussed treatment plan with pt at bedside and pt agreed to plan.  Labs (all labs ordered are listed, but only abnormal results are displayed) Labs Reviewed  COMPREHENSIVE METABOLIC PANEL - Abnormal; Notable for the following:       Result Value   Chloride 112 (*)    BUN 32 (*)    Creatinine, Ser 1.02 (*)    Total Protein 6.0 (*)    All other components within normal limits  ACETAMINOPHEN LEVEL - Abnormal; Notable for the following:    Acetaminophen (Tylenol), Serum <10 (*)    All other components within normal limits  RAPID URINE DRUG SCREEN, HOSP PERFORMED - Abnormal; Notable for the following:    Opiates POSITIVE (*)    Benzodiazepines POSITIVE (*)    Amphetamines POSITIVE (*)    All other components within normal limits  AMMONIA -  Abnormal; Notable for the following:    Ammonia 58 (*)    All other components within normal limits  CK - Abnormal; Notable for the following:  Total CK 257 (*)    All other components within normal limits  VITAMIN B12 - Abnormal; Notable for the following:    Vitamin B-12 2,224 (*)    All other components within normal limits  I-STAT ARTERIAL BLOOD GAS, ED - Abnormal; Notable for the following:    pH, Arterial 7.251 (*)    pCO2 arterial 51.9 (*)    Acid-base deficit 5.0 (*)    All other components within normal limits  CBC WITH DIFFERENTIAL/PLATELET  TROPONIN I  ETHANOL  SALICYLATE LEVEL  URINALYSIS, ROUTINE W REFLEX MICROSCOPIC  TSH  BLOOD GAS, ARTERIAL  FOLATE  IRON AND TIBC  FERRITIN  RETICULOCYTES  AMMONIA  CBC  BASIC METABOLIC PANEL  CBG MONITORING, ED  CBG MONITORING, ED  I-STAT CG4 LACTIC ACID, ED  I-STAT CG4 LACTIC ACID, ED    EKG  EKG Interpretation None       Radiology Dg Chest 1 View  Result Date: 09/27/2016 CLINICAL DATA:  Two days of altered mental status. On Eliquis, history of pulmonary embolism. EXAM: CHEST 1 VIEW COMPARISON:  Chest radiograph June 24, 2016 FINDINGS: The cardiac silhouette is mildly enlarged, mediastinal silhouette is nonsuspicious. Mild pulmonary vascular congestion without pleural effusion or focal consolidation. Low inspiratory examination. Trachea projects midline and there is no pneumothorax. Included soft tissue planes and osseous structures are non-suspicious. Old RIGHT rib fractures. IMPRESSION: Mild cardiomegaly and pulmonary vascular congestion. Electronically Signed   By: Elon Alas M.D.   On: 09/27/2016 03:14   Ct Head Wo Contrast  Result Date: 09/27/2016 CLINICAL DATA:  Altered mental status. History of pulmonary emboli, on Eliquis. History of non-Hodgkin's lymphoma. EXAM: CT HEAD WITHOUT CONTRAST CT CERVICAL SPINE WITHOUT CONTRAST TECHNIQUE: Multidetector CT imaging of the head and cervical spine was  performed following the standard protocol without intravenous contrast. Multiplanar CT image reconstructions of the cervical spine were also generated. COMPARISON:  MRI of the head July 25, 2016. FINDINGS: CT HEAD FINDINGS BRAIN: The ventricles and sulci are normal for age. No intraparenchymal hemorrhage, mass effect nor midline shift. No acute large vascular territory infarcts. No abnormal extra-axial fluid collections. Basal cisterns are patent. VASCULAR: Minimal calcific atherosclerosis of the carotid siphons. SKULL: No skull fracture. Moderate temporomandibular osteoarthrosis. No significant scalp soft tissue swelling. SINUSES/ORBITS: The mastoid air-cells and included paranasal sinuses are well-aerated.The included ocular globes and orbital contents are non-suspicious. OTHER: None. CT CERVICAL SPINE FINDINGS- Large body habitus results in overall noisy image quality. ALIGNMENT: Straightened lordosis. Vertebral bodies in alignment. SKULL BASE AND VERTEBRAE: Cervical vertebral bodies and posterior elements are intact. Moderate C6-7 disc height loss with uncovertebral hypertrophy. No destructive bony lesions. C1-2 articulation maintained. Multilevel mild facet arthropathy. SOFT TISSUES AND SPINAL CANAL: Normal. DISC LEVELS: No significant osseous canal stenosis. Moderate LEFT C6-7 neural foraminal narrowing. UPPER CHEST: Mild suspected pulmonary edema with interlobular septal thickening. OTHER: None. IMPRESSION: CT HEAD: No acute intracranial process; negative CT HEAD for age. CT CERVICAL SPINE: No acute fracture or malalignment. Mild suspected pulmonary edema, incompletely assessed. Moderate LEFT C6-7 neural foraminal narrowing. Electronically Signed   By: Elon Alas M.D.   On: 09/27/2016 03:20   Ct Cervical Spine Wo Contrast  Result Date: 09/27/2016 CLINICAL DATA:  Altered mental status. History of pulmonary emboli, on Eliquis. History of non-Hodgkin's lymphoma. EXAM: CT HEAD WITHOUT CONTRAST CT  CERVICAL SPINE WITHOUT CONTRAST TECHNIQUE: Multidetector CT imaging of the head and cervical spine was performed following the standard protocol without intravenous contrast. Multiplanar CT image reconstructions of  the cervical spine were also generated. COMPARISON:  MRI of the head July 25, 2016. FINDINGS: CT HEAD FINDINGS BRAIN: The ventricles and sulci are normal for age. No intraparenchymal hemorrhage, mass effect nor midline shift. No acute large vascular territory infarcts. No abnormal extra-axial fluid collections. Basal cisterns are patent. VASCULAR: Minimal calcific atherosclerosis of the carotid siphons. SKULL: No skull fracture. Moderate temporomandibular osteoarthrosis. No significant scalp soft tissue swelling. SINUSES/ORBITS: The mastoid air-cells and included paranasal sinuses are well-aerated.The included ocular globes and orbital contents are non-suspicious. OTHER: None. CT CERVICAL SPINE FINDINGS- Large body habitus results in overall noisy image quality. ALIGNMENT: Straightened lordosis. Vertebral bodies in alignment. SKULL BASE AND VERTEBRAE: Cervical vertebral bodies and posterior elements are intact. Moderate C6-7 disc height loss with uncovertebral hypertrophy. No destructive bony lesions. C1-2 articulation maintained. Multilevel mild facet arthropathy. SOFT TISSUES AND SPINAL CANAL: Normal. DISC LEVELS: No significant osseous canal stenosis. Moderate LEFT C6-7 neural foraminal narrowing. UPPER CHEST: Mild suspected pulmonary edema with interlobular septal thickening. OTHER: None. IMPRESSION: CT HEAD: No acute intracranial process; negative CT HEAD for age. CT CERVICAL SPINE: No acute fracture or malalignment. Mild suspected pulmonary edema, incompletely assessed. Moderate LEFT C6-7 neural foraminal narrowing. Electronically Signed   By: Elon Alas M.D.   On: 09/27/2016 03:20    Procedures Procedures (including critical care time)  Medications Ordered in ED Medications - No  data to display   Initial Impression / Assessment and Plan / ED Course  I have reviewed the triage vital signs and the nursing notes.  Pertinent labs & imaging results that were available during my care of the patient were reviewed by me and considered in my medical decision making (see chart for details).  Clinical Course   patient with 2 days of decreased mental status and somnolence.  No fever or recent illness. No new meds. She is on multiple sedating medications. Wife denies any overdose. On eliquis for massive PE in September.  CT head and C spine negative. Tox labs negative.  Ammonia mildly elevated. Mild CO2 retention on ABG.  Initial hypotension resolved with fluids. No improvement in mental status with narcan. No evidence of infection.  Suspect AMS due to polypharmacy.   Observation admission d/w Dr. Alcario Drought.  Final Clinical Impressions(s) / ED Diagnoses   Final diagnoses:  Altered mental status  Increased ammonia level    New Prescriptions New Prescriptions   No medications on file  I personally performed the services described in this documentation, which was scribed in my presence. The recorded information has been reviewed and is accurate.    Ezequiel Essex, MD 09/27/16 [phone]

## 2016-09-27 NOTE — ED Notes (Signed)
Pt cleaned up before transport.

## 2016-09-27 NOTE — H&P (Signed)
History and Physical    BRONWYN Payne [id number] DOB: [birth year] DOA: 09/27/2016   PCP: Melinda Crutch, MD Chief Complaint:  Chief Complaint  Patient presents with  . Fall  . Fatigue    HPI: Joyce Payne is a 55 y.o. female with medical history significant of anxiety, depression, chronic back pain, patient on numerous sedating meds at baseline.  Patient brought in to ED with lethargy and altered mental status x 2 days. She states that she is taking Eliquis regularly. EMS report states that pt fell from toilet while seated. Pt c/o neck pain and confirms taking mood altering medications. She denies headache, chest pain, back pain, medication overdose, Hx of heart problems and fever.  ED Course: Narcan had no effect.  Sedating meds finally wore off and patient now awake.  Review of Systems: As per HPI otherwise 10 point review of systems negative.    Past Medical History:  Diagnosis Date  . Anxiety   . Arthritis of knee   . 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 Chronic back pain   . Complication of anesthesia    had woken up during surgery, high tolerance to pain meds and anesthesia  . Depression   . Depression with anxiety 02/16/2012  . GERD (gastroesophageal reflux disease)   . H/O gastric bypass 02/15/2012   6/88  . History of DVT (deep vein thrombosis) NOV 2011- RIGHT LOWER EXTREMITY  . 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 Hx of pulmonary embolus 02/15/2012   Occurred during chemo for Hodgkin's dis 2011  . Impingement syndrome of left shoulder   . Neuromuscular disorder ([place])   . Recurrent binge eating 02/16/2012   Reason for topomax  . S/P gastric bypass   . Sinus infection      Past Surgical History:  Procedure Laterality Date  . GASTRIC BYPASS  1999  . IR GENERIC HISTORICAL  06/25/2016   IR US GUIDE VASC ACCESS RIGHT 06/25/2016 Sandi Mariscal, MD [facility]  . IR GENERIC HISTORICAL  06/25/2016   IR ANGIOGRAM SELECTIVE EACH ADDITIONAL VESSEL 06/25/2016 Sandi Mariscal, MD [facility]  . IR GENERIC HISTORICAL  06/25/2016   IR ANGIOGRAM SELECTIVE EACH ADDITIONAL VESSEL 06/25/2016 Sandi Mariscal, MD [facility]  . IR GENERIC HISTORICAL  06/25/2016   IR ANGIOGRAM PULMONARY BILATERAL SELECTIVE 06/25/2016 Sandi Mariscal, MD [facility]  . IR GENERIC HISTORICAL  06/25/2016   IR INFUSION THROMBOL ARTERIAL INITIAL (MS) 06/25/2016 Sandi Mariscal, MD [facility]  . IR GENERIC HISTORICAL  06/25/2016   IR INFUSION THROMBOL ARTERIAL INITIAL (MS) 06/25/2016 Sandi Mariscal, MD [facility]  . IR GENERIC HISTORICAL  06/26/2016   IR THROMB F/U EVAL ART/VEN FINAL DAY (MS) 06/26/2016 Aletta Edouard, MD [facility]  . [place] SURGERY  2009  . ORIF TOE FRACTURE Left 06/27/2015   Procedure: OPEN REDUCTION INTERNAL FIXATION (ORIF) LEFT FOURTH AND FIFTH METATARSAL (TOE) FRACTURE NONUNION;  Surgeon: Wylene Simmer, MD;  Location: [facility];  Service: Orthopedics;  Laterality: Left;  . PATELLA FRACTURE SURGERY    . PORT-A-CATH INSERTION  2011  . SHOULDER CLOSED REDUCTION  01/18/2012   Procedure: CLOSED MANIPULATION SHOULDER;  Surgeon: Johnn Hai, MD;  Location: [facility];  Service: Orthopedics;  Laterality: Left;  . TONSILECTOMY, ADENOIDECTOMY, BILATERAL MYRINGOTOMY AND TUBES  1985     reports that she quit smoking about 9 years ago. Her smoking use included Cigarettes. She has a 25.50 pack-year smoking history. She has quit using smokeless tobacco. She reports that she does not drink alcohol or use drugs.  Allergies  Allergen Reactions  . Tape Other (See Comments)    TEARS THE SKIN (SKIN IS THIN!!)  . Latex Rash  . Povidone-Iodine Rash    Family History   Problem Relation Age of Onset  . Hypertension Mother   . Hyperlipidemia Mother   . Cancer Father     colon can  . Hypertension Other   . Cancer Other     colon, 1st degree relative<60      Prior to Admission medications   Medication Sig Start Date End Date Taking? Authorizing Provider  apixaban (ELIQUIS) 5 MG TABS tablet Take 1 tablet (5 mg total) by mouth 2 (two) times daily. 08/27/16  Yes Juanito Doom, MD  baclofen (LIORESAL) 10 MG tablet Take 20 mg by mouth at bedtime.    Yes Historical Provider, MD  butalbital-acetaminophen-caffeine (FIORICET, ESGIC) 50-325-40 MG tablet Take 1-2 tablets by mouth every 8 (eight) hours as needed for headache. 07/26/16 07/26/17 Yes Antonietta Breach, PA-C  celecoxib (CELEBREX) 200 MG capsule Take 200 mg by mouth 2 (two) times daily. 09/15/16  Yes Historical Provider, MD  Cholecalciferol (VITAMIN D) 2000 units CAPS Take 2,000 Units by mouth daily.   Yes Historical Provider, MD  Cyanocobalamin (B-12 PO) Take 1 tablet by mouth every 3 (three) days.   Yes Historical Provider, MD  diazepam (VALIUM) 5 MG tablet Take 5 mg by mouth 3 (three) times daily as needed for anxiety.  07/22/16  Yes Historical Provider, MD  DULoxetine (CYMBALTA) 60 MG capsule Take 60 mg by mouth daily.   Yes Historical Provider, MD  fesoterodine (TOVIAZ) 8 MG TB24 tablet Take 8 mg by mouth daily.   Yes Historical Provider, MD  fexofenadine (ALLEGRA) 180 MG tablet Take 180 mg by mouth daily.   Yes Historical Provider, MD  fluticasone (FLONASE) 50 MCG/ACT nasal spray Place 2 sprays into both nostrils daily as needed for allergies.    Yes Historical Provider, MD  HYDROcodone-acetaminophen (NORCO) 10-325 MG tablet Take 1 tablet by mouth every 6 (six) hours as needed for moderate pain.    Yes Historical Provider, MD  lisdexamfetamine (VYVANSE) 70 MG capsule Take 70 mg by mouth daily.   Yes Historical Provider, MD  mirabegron ER (MYRBETRIQ) 50 MG TB24 tablet Take 50 mg by mouth daily.   Yes  Historical Provider, MD  Omeprazole-Sodium Bicarbonate (ZEGERID) 20-1100 MG CAPS capsule Take 1 capsule by mouth daily before breakfast.   Yes Historical Provider, MD  pregabalin (LYRICA) 150 MG capsule Take 150 mg by mouth 3 (three) times daily.   Yes Historical Provider, MD  topiramate (TOPAMAX) 50 MG tablet Take 50 mg by mouth daily. 09/09/16  Yes Historical Provider, MD  traZODone (DESYREL) 150 MG tablet Take 150 mg by mouth at bedtime.   Yes Historical Provider, MD    Physical Exam: Vitals:   09/27/16 0058 09/27/16 0130 09/27/16 0327 09/27/16 0330  BP: (!) 88/37 90/71 108/61 106/61  Pulse:  81 88 89  Resp: 10 17  14   Temp: 97.8 F (36.6 C)     TempSrc: Oral  SpO2:  99% 100% 99%      Constitutional: NAD, calm, comfortable Eyes: PERRL, lids and conjunctivae normal ENMT: Mucous membranes are moist. Posterior pharynx clear of any exudate or lesions.Normal dentition.  Neck: normal, supple, no masses, no thyromegaly Respiratory: clear to auscultation bilaterally, no wheezing, no crackles. Normal respiratory effort. No accessory muscle use.  Cardiovascular: Regular rate and rhythm, no murmurs / rubs / gallops. No extremity edema. 2+ pedal pulses. No carotid bruits.  Abdomen: no tenderness, no masses palpated. No hepatosplenomegaly. Bowel sounds positive.  Musculoskeletal: no clubbing / cyanosis. No joint deformity upper and lower extremities. Good ROM, no contractures. Normal muscle tone.  Skin: no rashes, lesions, ulcers. No induration Neurologic: CN 2-12 grossly intact. Sensation intact, DTR normal. Strength 5/5 in all 4.  Psychiatric: Normal judgment and insight. Alert and oriented x 3. Normal mood.    Labs on Admission: I have personally reviewed following labs and imaging studies  CBC:  Recent Labs Lab 09/27/16 0155  WBC 7.4  NEUTROABS 5.0  HGB 13.4  HCT 40.5  MCV 94.6  PLT 123XX123   Basic Metabolic Panel:  Recent Labs Lab 09/27/16 0155  NA 142  K 4.0  CL 112*   CO2 22  GLUCOSE 99  BUN 32*  CREATININE 1.02*  CALCIUM 9.2   GFR: CrCl cannot be calculated (Unknown ideal weight.). Liver Function Tests:  Recent Labs Lab 09/27/16 0155  AST 30  ALT 21  ALKPHOS 69  BILITOT 0.4  PROT 6.0*  ALBUMIN 3.7   No results for input(s): LIPASE, AMYLASE in the last 168 hours.  Recent Labs Lab 09/27/16 0155  AMMONIA 58*   Coagulation Profile: No results for input(s): INR, PROTIME in the last 168 hours. Cardiac Enzymes:  Recent Labs Lab 09/27/16 0155  CKTOTAL 35*  TROPONINI <0.03   BNP (last 3 results) No results for input(s): PROBNP in the last 8760 hours. HbA1C: No results for input(s): HGBA1C in the last 72 hours. CBG:  Recent Labs Lab 09/27/16 0055  GLUCAP 78   Lipid Profile: No results for input(s): CHOL, HDL, LDLCALC, TRIG, CHOLHDL, LDLDIRECT in the last 72 hours. Thyroid Function Tests: No results for input(s): TSH, T4TOTAL, FREET4, T3FREE, THYROIDAB in the last 72 hours. Anemia Panel: No results for input(s): VITAMINB12, FOLATE, FERRITIN, TIBC, IRON, RETICCTPCT in the last 72 hours. Urine analysis:    Component Value Date/Time   COLORURINE LT. YELLOW 09/09/2011 1356   APPEARANCEUR CLEAR 09/09/2011 1356   LABSPEC <=1.005 09/09/2011 1356   PHURINE 6.5 09/09/2011 1356   GLUCOSEU NEGATIVE 09/09/2011 1356   HGBUR NEGATIVE 09/09/2011 [place] 09/09/2011 1356   KETONESUR NEGATIVE 09/09/2011 1356   UROBILINOGEN 0.2 09/09/2011 1356   NITRITE NEGATIVE 09/09/2011 1356   LEUKOCYTESUR NEGATIVE 09/09/2011 1356   Sepsis Labs: @LABRCNTIP (procalcitonin:4,lacticidven:4) )No results found for this or any previous visit (from the past 240 hour(s)).   Radiological Exams on Admission: Dg Chest 1 View  Result Date: 09/27/2016 CLINICAL DATA:  Two days of altered mental status. On Eliquis, history of pulmonary embolism. EXAM: CHEST 1 VIEW COMPARISON:  Chest radiograph June 24, 2016 FINDINGS: The cardiac  silhouette is mildly enlarged, mediastinal silhouette is nonsuspicious. Mild pulmonary vascular congestion without pleural effusion or focal consolidation. Low inspiratory examination. Trachea projects midline and there is no pneumothorax. Included soft tissue planes and osseous structures are non-suspicious. Old RIGHT rib fractures. IMPRESSION: Mild cardiomegaly and pulmonary vascular congestion. Electronically Signed   By: Elon Alas M.D.   On:  09/27/2016 03:14   Ct Head Wo Contrast  Result Date: 09/27/2016 CLINICAL DATA:  Altered mental status. History of pulmonary emboli, on Eliquis. History of non-Hodgkin's lymphoma. EXAM: CT HEAD WITHOUT CONTRAST CT CERVICAL SPINE WITHOUT CONTRAST TECHNIQUE: Multidetector CT imaging of the head and cervical spine was performed following the standard protocol without intravenous contrast. Multiplanar CT image reconstructions of the cervical spine were also generated. COMPARISON:  MRI of the head July 25, 2016. FINDINGS: CT HEAD FINDINGS BRAIN: The ventricles and sulci are normal for age. No intraparenchymal hemorrhage, mass effect nor midline shift. No acute large vascular territory infarcts. No abnormal extra-axial fluid collections. Basal cisterns are patent. VASCULAR: Minimal calcific atherosclerosis of the carotid siphons. SKULL: No skull fracture. Moderate temporomandibular osteoarthrosis. No significant scalp soft tissue swelling. SINUSES/ORBITS: The mastoid air-cells and included paranasal sinuses are well-aerated.The included ocular globes and orbital contents are non-suspicious. OTHER: None. CT CERVICAL SPINE FINDINGS- Large body habitus results in overall noisy image quality. ALIGNMENT: Straightened lordosis. Vertebral bodies in alignment. SKULL BASE AND VERTEBRAE: Cervical vertebral bodies and posterior elements are intact. Moderate C6-7 disc height loss with uncovertebral hypertrophy. No destructive bony lesions. C1-2 articulation maintained.  Multilevel mild facet arthropathy. SOFT TISSUES AND SPINAL CANAL: Normal. DISC LEVELS: No significant osseous canal stenosis. Moderate LEFT C6-7 neural foraminal narrowing. UPPER CHEST: Mild suspected pulmonary edema with interlobular septal thickening. OTHER: None. IMPRESSION: CT HEAD: No acute intracranial process; negative CT HEAD for age. CT CERVICAL SPINE: No acute fracture or malalignment. Mild suspected pulmonary edema, incompletely assessed. Moderate LEFT C6-7 neural foraminal narrowing. Electronically Signed   By: Elon Alas M.D.   On: 09/27/2016 03:20   Ct Cervical Spine Wo Contrast  Result Date: 09/27/2016 CLINICAL DATA:  Altered mental status. History of pulmonary emboli, on Eliquis. History of non-Hodgkin's lymphoma. EXAM: CT HEAD WITHOUT CONTRAST CT CERVICAL SPINE WITHOUT CONTRAST TECHNIQUE: Multidetector CT imaging of the head and cervical spine was performed following the standard protocol without intravenous contrast. Multiplanar CT image reconstructions of the cervical spine were also generated. COMPARISON:  MRI of the head July 25, 2016. FINDINGS: CT HEAD FINDINGS BRAIN: The ventricles and sulci are normal for age. No intraparenchymal hemorrhage, mass effect nor midline shift. No acute large vascular territory infarcts. No abnormal extra-axial fluid collections. Basal cisterns are patent. VASCULAR: Minimal calcific atherosclerosis of the carotid siphons. SKULL: No skull fracture. Moderate temporomandibular osteoarthrosis. No significant scalp soft tissue swelling. SINUSES/ORBITS: The mastoid air-cells and included paranasal sinuses are well-aerated.The included ocular globes and orbital contents are non-suspicious. OTHER: None. CT CERVICAL SPINE FINDINGS- Large body habitus results in overall noisy image quality. ALIGNMENT: Straightened lordosis. Vertebral bodies in alignment. SKULL BASE AND VERTEBRAE: Cervical vertebral bodies and posterior elements are intact. Moderate C6-7 disc  height loss with uncovertebral hypertrophy. No destructive bony lesions. C1-2 articulation maintained. Multilevel mild facet arthropathy. SOFT TISSUES AND SPINAL CANAL: Normal. DISC LEVELS: No significant osseous canal stenosis. Moderate LEFT C6-7 neural foraminal narrowing. UPPER CHEST: Mild suspected pulmonary edema with interlobular septal thickening. OTHER: None. IMPRESSION: CT HEAD: No acute intracranial process; negative CT HEAD for age. CT CERVICAL SPINE: No acute fracture or malalignment. Mild suspected pulmonary edema, incompletely assessed. Moderate LEFT C6-7 neural foraminal narrowing. Electronically Signed   By: Elon Alas M.D.   On: 09/27/2016 03:20    EKG: Independently reviewed.  Assessment/Plan Principal Problem:   Lethargy    1. Lethargy - 1. Suspect polypharmacy to blame 2. Holding all sedating meds at this time 3. Will put  in for observation   DVT prophylaxis: Eliquis Code Status: Full Family Communication: Wife at bedside Consults called: None Admission status: Admit to obs   G[place], [facility] Pager [phone] from 7PM-7AM  If 7AM-7PM, please contact the day physician for the patient [email or web address] Password [email or web address]  09/27/2016, 4:21 AM

## 2016-09-27 NOTE — ED Notes (Signed)
Need blood for istat lactic acid,  Pt not in room at this time.

## 2016-09-27 NOTE — ED Notes (Addendum)
Annalina, pt's wife, [phone] (cell) Please call with updates

## 2016-09-28 ENCOUNTER — Observation Stay (HOSPITAL_COMMUNITY): Payer: BC Managed Care – PPO

## 2016-09-28 DIAGNOSIS — R5383 Other fatigue: Secondary | ICD-10-CM | POA: Diagnosis not present

## 2016-09-28 LAB — BASIC METABOLIC PANEL
ANION GAP: 6 (ref 5–15)
BUN: 24 mg/dL — ABNORMAL HIGH (ref 6–20)
CHLORIDE: 112 mmol/L — AB (ref 101–111)
CO2: 25 mmol/L (ref 22–32)
Calcium: 8.5 mg/dL — ABNORMAL LOW (ref 8.9–10.3)
Creatinine, Ser: 0.82 mg/dL (ref 0.44–1.00)
GFR calc non Af Amer: >60 mL/min (ref >60)
Glucose, Bld: 90 mg/dL (ref 65–99)
POTASSIUM: 3.5 mmol/L (ref 3.5–5.1)
Sodium: 143 mmol/L (ref 135–145)

## 2016-09-28 LAB — CBC
HCT: 37.6 % (ref 36.0–46.0)
HEMOGLOBIN: 12.4 g/dL (ref 12.0–15.0)
MCH: 31.4 pg (ref 26.0–34.0)
MCHC: 33 g/dL (ref 30.0–36.0)
MCV: 95.2 fL (ref 78.0–100.0)
Platelets: 216 10*3/uL (ref 150–400)
RBC: 3.95 MIL/uL (ref 3.87–5.11)
RDW: 14 % (ref 11.5–15.5)
WBC: 7.2 10*3/uL (ref 4.0–10.5)

## 2016-09-28 LAB — AMMONIA: Ammonia: 45 umol/L — ABNORMAL HIGH (ref 9–35)

## 2016-09-28 MED ORDER — TRAZODONE HCL 150 MG PO TABS: 75.0000 mg | ORAL_TABLET | Freq: Every evening | ORAL | Status: DC | PRN

## 2016-09-28 MED ORDER — HYDROCODONE-ACETAMINOPHEN 10-325 MG PO TABS: 1.0000 | ORAL_TABLET | Freq: Two times a day (BID) | ORAL | 0 refills | Status: DC | PRN

## 2016-09-28 MED ORDER — DIAZEPAM 5 MG PO TABS: 2.5000 mg | ORAL_TABLET | Freq: Two times a day (BID) | ORAL | 0 refills | Status: DC | PRN

## 2016-09-28 MED ORDER — FERROUS GLUCONATE 324 (38 FE) MG PO TABS: 324.0000 mg | ORAL_TABLET | Freq: Two times a day (BID) | ORAL | 0 refills | Status: DC

## 2016-09-28 MED ORDER — ALUM & MAG HYDROXIDE-SIMETH 200-200-20 MG/5ML PO SUSP
30.0000 mL | ORAL | Status: DC | PRN
  Administered 2016-09-28: 30 mL via ORAL
  Filled 2016-09-28: qty 30

## 2016-09-28 MED ORDER — PREGABALIN 150 MG PO CAPS: 150.0000 mg | ORAL_CAPSULE | Freq: Every day | ORAL | Status: DC

## 2016-09-28 NOTE — Discharge Summary (Signed)
Physician Discharge Summary  Joyce Payne [id number] DOB: [birth year] DOA: 09/27/2016  PCP: Melinda Crutch, MD  Admit date: 09/27/2016 Discharge date: 09/28/2016  Admitted From: home Disposition:  home  Recommendations for Outpatient Follow-up:  1. Follow up with PCP in 1-2 weeks 2. Patient to start CPAP for OSA  Home Health: none Equipment/Devices: none  Discharge Condition: stable CODE STATUS: Full Diet recommendation: regular  HPI: per Dr. Alcario Drought, Joyce Payne is a 55 y.o. female with medical history significant of anxiety, depression, chronic back pain, patient on numerous sedating meds at baseline.  Patient brought in to ED with lethargy and altered mental status x 2 days. She states that she is taking Eliquis regularly. EMS report states that pt fell from toilet while seated. Pt c/o neck pain and confirms taking mood altering medications. She denies headache, chest pain, back pain, medication overdose, Hx of heart problems and fever. ED Course: Narcan had no effect.  Sedating meds finally wore off and patient now awake.  Hospital Course: Discharge Diagnoses:  Principal Problem:   Lethargy  Acute encephalopathy - Patient admitted with acute encephalopathy in the setting of polypharmacy at home, her offending medications were held and she slowly recovered. She was slightly acidotic and hypercarbic on admission, repeat ABG after mental status is improved was within normal limits. She was recently diagnosed with sleep apnea and she is to receive her CPAP in couple of days at home. She was also found to have an elevated ammonia level, which is probably due to her home narcotic use. She underwent a right upper quadrant ultrasound which showed slightly echogenic liver, likely fatty infiltration without clear-cut cirrhosis changes, unlikely contributory to her elevated ammonia levels. She was also complaining of hair loss, TSH was normal, iron studies were done and iron / ferritin  was on the low side of normal, we'll try iron supplementation to see if this helps. We'll recommend to repeat iron panel in about couple months. Discussed extensively at bedside with patient and her wife, advised to check with patient's PCP to discuss her medications and see if there are ways to cut back on benzodiazepine/narcotic/muscle relaxer/stimulants combo. For now will recommend to cut from home medications in half Recent PE - status post lysis, she is ion Eliquis, continue Her other medical problems were stable and no other medication changes were made to her chronic regimen  Discharge Instructions  Allergies as of 09/28/2016      Reactions   Tape Other (See Comments)   TEARS THE SKIN (SKIN IS THIN!!)   Latex Rash   Povidone-iodine Rash      Medication List    STOP taking these medications   baclofen 10 MG tablet Commonly known as:  LIORESAL     TAKE these medications   apixaban 5 MG Tabs tablet Commonly known as:  ELIQUIS Take 1 tablet (5 mg total) by mouth 2 (two) times daily.   B-12 PO Take 1 tablet by mouth every 3 (three) days.   butalbital-acetaminophen-caffeine 50-325-40 MG tablet Commonly known as:  FIORICET, ESGIC Take 1-2 tablets by mouth every 8 (eight) hours as needed for headache.   celecoxib 200 MG capsule Commonly known as:  CELEBREX Take 200 mg by mouth 2 (two) times daily.   diazepam 5 MG tablet Commonly known as:  VALIUM Take 0.5 tablets (2.5 mg total) by mouth every 12 (twelve) hours as needed for anxiety. What changed:  how much to take  when to take this  DULoxetine 60 MG capsule Commonly known as:  CYMBALTA Take 60 mg by mouth daily.   ferrous gluconate 324 MG tablet Commonly known as:  FERGON Take 1 tablet (324 mg total) by mouth 2 (two) times daily with a meal.   fexofenadine 180 MG tablet Commonly known as:  ALLEGRA Take 180 mg by mouth daily.   fluticasone 50 MCG/ACT nasal spray Commonly known as:  FLONASE Place 2 sprays  into both nostrils daily as needed for allergies.   HYDROcodone-acetaminophen 10-325 MG tablet Commonly known as:  NORCO Take 1 tablet by mouth every 12 (twelve) hours as needed for moderate pain. What changed:  when to take this   lisdexamfetamine 70 MG capsule Commonly known as:  VYVANSE Take 70 mg by mouth daily.   MYRBETRIQ 50 MG Tb24 tablet Generic drug:  mirabegron ER Take 50 mg by mouth daily.   Omeprazole-Sodium Bicarbonate 20-1100 MG Caps capsule Commonly known as:  ZEGERID Take 1 capsule by mouth daily before breakfast.   pregabalin 150 MG capsule Commonly known as:  LYRICA Take 1 capsule (150 mg total) by mouth daily. What changed:  when to take this   topiramate 50 MG tablet Commonly known as:  TOPAMAX Take 50 mg by mouth daily.   TOVIAZ 8 MG Tb24 tablet Generic drug:  fesoterodine Take 8 mg by mouth daily.   traZODone 150 MG tablet Commonly known as:  DESYREL Take 0.5 tablets (75 mg total) by mouth at bedtime as needed for sleep. What changed:  how much to take  when to take this  reasons to take this   Vitamin D 2000 units Caps Take 2,000 Units by mouth daily.       Allergies  Allergen Reactions  . Tape Other (See Comments)    TEARS THE SKIN (SKIN IS THIN!!)  . Latex Rash  . Povidone-Iodine Rash    Consultations: None  Procedures/Studies:  Dg Chest 1 View  Result Date: 09/27/2016 CLINICAL DATA:  Two days of altered mental status. On Eliquis, history of pulmonary embolism. EXAM: CHEST 1 VIEW COMPARISON:  Chest radiograph June 24, 2016 FINDINGS: The cardiac silhouette is mildly enlarged, mediastinal silhouette is nonsuspicious. Mild pulmonary vascular congestion without pleural effusion or focal consolidation. Low inspiratory examination. Trachea projects midline and there is no pneumothorax. Included soft tissue planes and osseous structures are non-suspicious. Old RIGHT rib fractures. IMPRESSION: Mild cardiomegaly and pulmonary  vascular congestion. Electronically Signed   By: Elon Alas M.D.   On: 09/27/2016 03:14   Ct Head Wo Contrast  Result Date: 09/27/2016 CLINICAL DATA:  Altered mental status. History of pulmonary emboli, on Eliquis. History of non-Hodgkin's lymphoma. EXAM: CT HEAD WITHOUT CONTRAST CT CERVICAL SPINE WITHOUT CONTRAST TECHNIQUE: Multidetector CT imaging of the head and cervical spine was performed following the standard protocol without intravenous contrast. Multiplanar CT image reconstructions of the cervical spine were also generated. COMPARISON:  MRI of the head July 25, 2016. FINDINGS: CT HEAD FINDINGS BRAIN: The ventricles and sulci are normal for age. No intraparenchymal hemorrhage, mass effect nor midline shift. No acute large vascular territory infarcts. No abnormal extra-axial fluid collections. Basal cisterns are patent. VASCULAR: Minimal calcific atherosclerosis of the carotid siphons. SKULL: No skull fracture. Moderate temporomandibular osteoarthrosis. No significant scalp soft tissue swelling. SINUSES/ORBITS: The mastoid air-cells and included paranasal sinuses are well-aerated.The included ocular globes and orbital contents are non-suspicious. OTHER: None. CT CERVICAL SPINE FINDINGS- Large body habitus results in overall noisy image quality. ALIGNMENT: Straightened lordosis. Vertebral bodies in  alignment. SKULL BASE AND VERTEBRAE: Cervical vertebral bodies and posterior elements are intact. Moderate C6-7 disc height loss with uncovertebral hypertrophy. No destructive bony lesions. C1-2 articulation maintained. Multilevel mild facet arthropathy. SOFT TISSUES AND SPINAL CANAL: Normal. DISC LEVELS: No significant osseous canal stenosis. Moderate LEFT C6-7 neural foraminal narrowing. UPPER CHEST: Mild suspected pulmonary edema with interlobular septal thickening. OTHER: None. IMPRESSION: CT HEAD: No acute intracranial process; negative CT HEAD for age. CT CERVICAL SPINE: No acute fracture or  malalignment. Mild suspected pulmonary edema, incompletely assessed. Moderate LEFT C6-7 neural foraminal narrowing. Electronically Signed   By: Elon Alas M.D.   On: 09/27/2016 03:20   Ct Cervical Spine Wo Contrast  Result Date: 09/27/2016 CLINICAL DATA:  Altered mental status. History of pulmonary emboli, on Eliquis. History of non-Hodgkin's lymphoma. EXAM: CT HEAD WITHOUT CONTRAST CT CERVICAL SPINE WITHOUT CONTRAST TECHNIQUE: Multidetector CT imaging of the head and cervical spine was performed following the standard protocol without intravenous contrast. Multiplanar CT image reconstructions of the cervical spine were also generated. COMPARISON:  MRI of the head July 25, 2016. FINDINGS: CT HEAD FINDINGS BRAIN: The ventricles and sulci are normal for age. No intraparenchymal hemorrhage, mass effect nor midline shift. No acute large vascular territory infarcts. No abnormal extra-axial fluid collections. Basal cisterns are patent. VASCULAR: Minimal calcific atherosclerosis of the carotid siphons. SKULL: No skull fracture. Moderate temporomandibular osteoarthrosis. No significant scalp soft tissue swelling. SINUSES/ORBITS: The mastoid air-cells and included paranasal sinuses are well-aerated.The included ocular globes and orbital contents are non-suspicious. OTHER: None. CT CERVICAL SPINE FINDINGS- Large body habitus results in overall noisy image quality. ALIGNMENT: Straightened lordosis. Vertebral bodies in alignment. SKULL BASE AND VERTEBRAE: Cervical vertebral bodies and posterior elements are intact. Moderate C6-7 disc height loss with uncovertebral hypertrophy. No destructive bony lesions. C1-2 articulation maintained. Multilevel mild facet arthropathy. SOFT TISSUES AND SPINAL CANAL: Normal. DISC LEVELS: No significant osseous canal stenosis. Moderate LEFT C6-7 neural foraminal narrowing. UPPER CHEST: Mild suspected pulmonary edema with interlobular septal thickening. OTHER: None. IMPRESSION: CT  HEAD: No acute intracranial process; negative CT HEAD for age. CT CERVICAL SPINE: No acute fracture or malalignment. Mild suspected pulmonary edema, incompletely assessed. Moderate LEFT C6-7 neural foraminal narrowing. Electronically Signed   By: Elon Alas M.D.   On: 09/27/2016 03:20   US Abdomen Limited Ruq  Result Date: 09/28/2016 CLINICAL DATA:  Elevated ammonia levels. EXAM: US ABDOMEN LIMITED - RIGHT UPPER QUADRANT COMPARISON:  CT 02/06/2013 . FINDINGS: Gallbladder: No gallstones or wall thickening visualized. No sonographic Murphy sign noted by sonographer. Common bile duct: Diameter: 2.7 mm Liver: Liver is slightly echogenic. Fatty infiltration and/or hepatocellular disease cannot be excluded. No focal hepatic abnormality identified. IMPRESSION: Liver is slightly echogenic. Fatty infiltration and/or hepatocellular disease cannot be excluded. No focal hepatic abnormality identified. No gallstones or biliary distention. Electronically Signed   By: Marcello Moores  Register   On: 09/28/2016 07:12      Subjective: - no chest pain, shortness of breath, no abdominal pain, nausea or vomiting.   Discharge Exam: Vitals:   09/28/16 0219 09/28/16 0645  BP: (!) 88/34 (!) 99/54  Pulse: 88 70  Resp:  18  Temp:  97.7 F (36.5 C)   Vitals:   09/27/16 2319 09/28/16 0217 09/28/16 0219 09/28/16 0645  BP: (!) 94/48 (!) 100/40 (!) 88/34 (!) 99/54  Pulse: 81 82 88 70  Resp:    18  Temp: 98.2 F (36.8 C) 98.6 F (37 C)  97.7 F (36.5 C)  TempSrc: Oral  Oral  Oral  SpO2: 95% 96% 96% 98%  Weight:      Height:        General: Pt is alert, awake, not in acute distress Cardiovascular: RRR, S1/S2 +, no rubs, no gallops Respiratory: CTA bilaterally, no wheezing, no rhonchi Abdominal: Soft, NT, ND, bowel sounds + Extremities: no edema, no cyanosis    The results of significant diagnostics from this hospitalization (including imaging, microbiology, ancillary and laboratory) are listed below for  reference.     Microbiology: No results found for this or any previous visit (from the past 240 hour(s)).   Labs: BNP (last 3 results)  Recent Labs  06/24/16 1604  BNP 0000000*   Basic Metabolic Panel:  Recent Labs Lab 09/27/16 0155 09/28/16 0405  NA 142 143  K 4.0 3.5  CL 112* 112*  CO2 22 25  GLUCOSE 99 90  BUN 32* 24*  CREATININE 1.02* 0.82  CALCIUM 9.2 8.5*   Liver Function Tests:  Recent Labs Lab 09/27/16 0155  AST 30  ALT 21  ALKPHOS 69  BILITOT 0.4  PROT 6.0*  ALBUMIN 3.7   No results for input(s): LIPASE, AMYLASE in the last 168 hours.  Recent Labs Lab 09/27/16 0155 09/28/16 0405  AMMONIA 58* 45*   CBC:  Recent Labs Lab 09/27/16 0155 09/28/16 0405  WBC 7.4 7.2  NEUTROABS 5.0  --   HGB 13.4 12.4  HCT 40.5 37.6  MCV 94.6 95.2  PLT 216 216   Cardiac Enzymes:  Recent Labs Lab 09/27/16 0155  CKTOTAL 98*  TROPONINI <0.03   BNP: Invalid input(s): POCBNP CBG:  Recent Labs Lab 09/27/16 0055  GLUCAP 78   D-Dimer No results for input(s): DDIMER in the last 72 hours. Hgb A1c No results for input(s): HGBA1C in the last 72 hours. Lipid Profile No results for input(s): CHOL, HDL, LDLCALC, TRIG, CHOLHDL, LDLDIRECT in the last 72 hours. Thyroid function studies  Recent Labs  09/27/16 1505  TSH 0.996   Anemia work up  Recent Labs  09/27/16 1505  VITAMINB12 2,224*  FOLATE 27.4  FERRITIN 25  TIBC 272  IRON 45  RETICCTPCT 1.8   Urinalysis    Component Value Date/Time   COLORURINE YELLOW 09/27/2016 0424   APPEARANCEUR CLEAR 09/27/2016 0424   LABSPEC 1.009 09/27/2016 0424   PHURINE 5.0 09/27/2016 0424   GLUCOSEU NEGATIVE 09/27/2016 0424   GLUCOSEU NEGATIVE 09/09/2011 1356   HGBUR NEGATIVE 09/27/2016 0424   BILIRUBINUR NEGATIVE 09/27/2016 0424   KETONESUR NEGATIVE 09/27/2016 0424   PROTEINUR NEGATIVE 09/27/2016 0424   UROBILINOGEN 0.2 09/09/2011 1356   NITRITE NEGATIVE 09/27/2016 0424   LEUKOCYTESUR NEGATIVE  09/27/2016 0424   Sepsis Labs Invalid input(s): PROCALCITONIN,  WBC,  LACTICIDVEN Microbiology No results found for this or any previous visit (from the past 240 hour(s)).   Time coordinating discharge: Over 30 minutes  SIGNED:  Marzetta Board, MD  [facility] 09/28/2016, 2:41 PM Pager [phone]  If 7PM-7AM, please contact night-coverage [email or web address] Password [email or web address]

## 2016-09-28 NOTE — Progress Notes (Signed)
Patient was discharged home by MD order; discharged instructions  review and give to patient with care notes and prescription; IV DIC; skin intact; patient will be escorted to the car by a volunteer via wheelchair.  

## 2016-09-28 NOTE — Care Management Note (Signed)
Case Management Note  Patient Details  Name: Joyce Payne MRN: [id number] Date of Birth: 1960-10-21  Subjective/Objective:       Presents with lethargy and AMS,  history of anxiety, depression, chronic back pain, patient on numerous sedating meds at baseline.  Independent with ADL's, and no DME usage PTA. Pt awaiting CPAP machine from [place] which pt states will be delivered to her home  on 12/201/2017.      Hlee Dawes (Significant other)     [phone]       PCP: Lona Kettle  Action/Plan: Plan is to d/c to home today.  Expected Discharge Date:       09/28/2016           Expected Discharge Plan:  Home/Self Care  In-House Referral:     Discharge planning Services  CM Consult  Post Acute Care Choice:    Choice offered to:     DME Arranged:    DME Agency:     HH Arranged:    HH Agency:     Status of Service:  Completed, signed off  If discussed at [employer] of Stay Meetings, dates discussed:    Additional Comments:  Sharin Mons, RN 09/28/2016, 10:05 AM

## 2016-11-16 ENCOUNTER — Telehealth: Payer: Self-pay | Admitting: Pulmonary Disease

## 2016-11-16 ENCOUNTER — Telehealth: Payer: Self-pay

## 2016-11-16 NOTE — Telephone Encounter (Signed)
Patient called for advice from Dr. Alvy Bimler. Requesting advice on impact of medication. Has discontinued Valium, and heart rate is elevated. Has been elevated 100 to 120 for the last month. Danger of stopping on heart/ lung condition (pulmonary embolism).  Should she be worried? Restart medication back?

## 2016-11-16 NOTE — Telephone Encounter (Signed)
lmtcb for pt.  

## 2016-11-16 NOTE — Telephone Encounter (Signed)
Called and instructed patient of below note from Dr. Alvy Bimler. Verbalized understanding.

## 2016-11-16 NOTE — Telephone Encounter (Signed)
I believed it was stopped due to recent altered mental status (she was hospitalized last month). I think she should discuss this with her primary care doctor whether her elevated heart rate was related to her heart or anxiety

## 2016-11-17 NOTE — Telephone Encounter (Signed)
Spoke with pt and per ov note 08/25/16 Pt needs to be seen in April 2018 after her ECho.  PT scheduled to See Dr Lake Bells on 01/18/17 at 3:15.  Nothing further needed at this time.

## 2016-11-27 ENCOUNTER — Other Ambulatory Visit: Payer: Self-pay | Admitting: Cardiology

## 2016-11-27 DIAGNOSIS — R0602 Shortness of breath: Secondary | ICD-10-CM

## 2016-11-27 DIAGNOSIS — R000 Tachycardia, unspecified: Secondary | ICD-10-CM

## 2016-11-28 ENCOUNTER — Other Ambulatory Visit: Payer: Self-pay | Admitting: Cardiology

## 2016-12-04 ENCOUNTER — Ambulatory Visit (HOSPITAL_COMMUNITY)
Admission: RE | Admit: 2016-12-04 | Discharge: 2016-12-04 | Disposition: A | Discharge status: Home / Self Care | Payer: BC Managed Care – PPO | Source: Ambulatory Visit | Attending: Cardiology | Admitting: Cardiology

## 2016-12-04 DIAGNOSIS — R000 Tachycardia, unspecified: Secondary | ICD-10-CM | POA: Diagnosis present

## 2016-12-04 MED ORDER — TECHNETIUM TC 99M DIETHYLENETRIAME-PENTAACETIC ACID
32.4000 | Freq: Once | INTRAVENOUS | Status: AC | PRN
  Administered 2016-12-04: 32.4 via RESPIRATORY_TRACT

## 2016-12-04 MED ORDER — TECHNETIUM TO 99M ALBUMIN AGGREGATED
4.2700 | Freq: Once | INTRAVENOUS | Status: AC | PRN
  Administered 2016-12-04: 4.27 via INTRAVENOUS

## 2016-12-07 ENCOUNTER — Ambulatory Visit (HOSPITAL_COMMUNITY)
Admission: RE | Admit: 2016-12-07 | Discharge: 2016-12-07 | Disposition: A | Discharge status: Home / Self Care | Payer: BC Managed Care – PPO | Source: Ambulatory Visit | Attending: Family Medicine | Admitting: Family Medicine

## 2016-12-07 DIAGNOSIS — R0602 Shortness of breath: Secondary | ICD-10-CM | POA: Insufficient documentation

## 2016-12-07 DIAGNOSIS — I110 Hypertensive heart disease with heart failure: Secondary | ICD-10-CM | POA: Diagnosis not present

## 2016-12-07 DIAGNOSIS — E119 Type 2 diabetes mellitus without complications: Secondary | ICD-10-CM | POA: Diagnosis not present

## 2016-12-07 DIAGNOSIS — J449 Chronic obstructive pulmonary disease, unspecified: Secondary | ICD-10-CM | POA: Insufficient documentation

## 2016-12-07 DIAGNOSIS — R079 Chest pain, unspecified: Secondary | ICD-10-CM | POA: Insufficient documentation

## 2016-12-07 DIAGNOSIS — Z87891 Personal history of nicotine dependence: Secondary | ICD-10-CM | POA: Insufficient documentation

## 2016-12-07 DIAGNOSIS — I252 Old myocardial infarction: Secondary | ICD-10-CM | POA: Diagnosis not present

## 2016-12-07 DIAGNOSIS — I2510 Atherosclerotic heart disease of native coronary artery without angina pectoris: Secondary | ICD-10-CM | POA: Diagnosis not present

## 2016-12-07 DIAGNOSIS — I509 Heart failure, unspecified: Secondary | ICD-10-CM | POA: Diagnosis not present

## 2016-12-07 NOTE — Progress Notes (Signed)
  Echocardiogram 2D Echocardiogram has been performed.  Jennette Dubin 12/07/2016, 9:16 AM

## 2016-12-29 ENCOUNTER — Ambulatory Visit (HOSPITAL_COMMUNITY): Payer: BC Managed Care – PPO | Attending: Cardiology

## 2016-12-29 DIAGNOSIS — R0602 Shortness of breath: Secondary | ICD-10-CM | POA: Diagnosis not present

## 2016-12-30 DIAGNOSIS — R0602 Shortness of breath: Secondary | ICD-10-CM | POA: Diagnosis not present

## 2017-01-11 ENCOUNTER — Other Ambulatory Visit (HOSPITAL_COMMUNITY): Payer: BC Managed Care – PPO

## 2017-01-11 ENCOUNTER — Other Ambulatory Visit (HOSPITAL_BASED_OUTPATIENT_CLINIC_OR_DEPARTMENT_OTHER): Payer: BC Managed Care – PPO

## 2017-01-11 DIAGNOSIS — C8110 Nodular sclerosis classical Hodgkin lymphoma, unspecified site: Secondary | ICD-10-CM | POA: Diagnosis not present

## 2017-01-11 DIAGNOSIS — I2692 Saddle embolus of pulmonary artery without acute cor pulmonale: Secondary | ICD-10-CM

## 2017-01-11 LAB — BASIC METABOLIC PANEL
ANION GAP: 8 meq/L (ref 3–11)
BUN: 14.4 mg/dL (ref 7.0–26.0)
CALCIUM: 9.6 mg/dL (ref 8.4–10.4)
CO2: 28 mEq/L (ref 22–29)
Chloride: 109 mEq/L (ref 98–109)
Creatinine: 0.8 mg/dL (ref 0.6–1.1)
EGFR: 84 mL/min/{1.73_m2} — ABNORMAL LOW (ref >90)
GLUCOSE: 94 mg/dL (ref 70–140)
POTASSIUM: 4.7 meq/L (ref 3.5–5.1)
Sodium: 145 mEq/L (ref 136–145)

## 2017-01-11 LAB — CBC WITH DIFFERENTIAL/PLATELET
BASO%: 0.3 % (ref 0.0–2.0)
BASOS ABS: 0 10*3/uL (ref 0.0–0.1)
EOS%: 0.3 % (ref 0.0–7.0)
Eosinophils Absolute: 0 10*3/uL (ref 0.0–0.5)
HEMATOCRIT: 41.5 % (ref 34.8–46.6)
HGB: 14 g/dL (ref 11.6–15.9)
LYMPH#: 1.6 10*3/uL (ref 0.9–3.3)
LYMPH%: 23 % (ref 14.0–49.7)
MCH: 31.4 pg (ref 25.1–34.0)
MCHC: 33.7 g/dL (ref 31.5–36.0)
MCV: 93.2 fL (ref 79.5–101.0)
MONO#: 0.5 10*3/uL (ref 0.1–0.9)
MONO%: 7 % (ref 0.0–14.0)
NEUT#: 4.8 10*3/uL (ref 1.5–6.5)
NEUT%: 69.4 % (ref 38.4–76.8)
Platelets: 241 10*3/uL (ref 145–400)
RBC: 4.45 10*6/uL (ref 3.70–5.45)
RDW: 13.5 % (ref 11.2–14.5)
WBC: 7 10*3/uL (ref 3.9–10.3)

## 2017-01-12 ENCOUNTER — Encounter: Payer: Self-pay | Admitting: Hematology and Oncology

## 2017-01-12 ENCOUNTER — Ambulatory Visit (HOSPITAL_BASED_OUTPATIENT_CLINIC_OR_DEPARTMENT_OTHER): Payer: BC Managed Care – PPO | Admitting: Hematology and Oncology

## 2017-01-12 DIAGNOSIS — Z8579 Personal history of other malignant neoplasms of lymphoid, hematopoietic and related tissues: Secondary | ICD-10-CM

## 2017-01-12 DIAGNOSIS — I2692 Saddle embolus of pulmonary artery without acute cor pulmonale: Secondary | ICD-10-CM

## 2017-01-12 NOTE — Assessment & Plan Note (Signed)
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  I do not see a reason to order excessive testing to screen for thrombophilia disorder as it would not change our management.  Even though she has recurrent provoked blood clots, with recent significant saddle PE with cor pulmonale, I recommend she continues anticoagulation therapy lifelong.  Recent echocardiogram show no evidence of cor pulmonale She is doing well on apixaban without bleeding complication I recommend twice a year blood count monitoring through her primary care doctor's office to monitor creatinine function and CBC to exclude bleeding She does not need long-term follow-up here

## 2017-01-12 NOTE — Assessment & Plan Note (Addendum)
Clinically, she has no evidence to suggest recurrence. I told the patient that is no benefit for routine imaging study unless there are signs and symptoms suspicious disease recurrence. Since that she is a long-term cancer survivor, she is being discharged She is educated to watch for signs and symptoms of cancer recurrence

## 2017-01-12 NOTE — Progress Notes (Signed)
[name] OFFICE PROGRESS NOTE  Patient Care Team: Lawerance Cruel, MD as PCP - General (Family Medicine) Heath Lark, MD as Consulting Physician (Hematology and Oncology)  SUMMARY OF ONCOLOGIC HISTORY:  Joyce Payne was transferred to my care after her prior physician has left.  I reviewed the patient's records extensive and collaborated the history with the patient. Summary of her history is as follows: This is a woman with stage IIA Hodgkin's lymphoma in remission.  She presented in mid-June of 2011 with a palpable lymph node mass in the right neck and supraclavicular area. She was evaluated by Dr. Nonda Lou at the [facility] in [place]. CT scan showed additional adenopathy in the right paratracheal region. Apparently, PET scanning also showed an abnormal activity in the left hip. An initial staging bone marrow biopsy was negative for lymphoma. It is really not clear whether or not the abnormality in the left hip was really a focus of involvement with Hodgkin's disease. I personally tend to doubt this. She was treated with 6 cycles of ABVD chemotherapy. Due to decline in her oxygen exchange, the bleomycin had to be deleted from the last 2 cycles.  She developed an extensive cellulitis of her right calf and thigh in November of 2011. Although lower extremity Dopplers were negative for clot, it was presumed that this was where the clot started. She was admitted to the hospital. Scanning incidentally showed pulmonary emboli at that time (08/25/10). She was anticoagulated for 1 year.  She completed all planned chemotherapy approximately 09/30/10. Interim scanning after 4 cycles showed a complete response. Subsequent studies done at time she moved to [facility] in June 2012 showed ongoing complete remission. Scans done 02/06/2013 show no evidence for new disease. An asymptomatic lucent lesion in the anterior left femoral head is stable. The patient was discharged  from the clinic in 2016. She was admitted to the hospital with acute bilateral pulmonary emboli, right greater than left with acute right LE DVT WITH EVIDENCE OF RIGHT HEART STRAIN. IR was consulted, patient underwent thrombolysis. The patient did well after thrombolysis and was transferred to regular floor on 9/15. She was placed on xarelto by pulmonology which she has been tolerating well. Xarelto was subsequently changed to Eliquis due to insurance issue Repeat echocardiogram in February 2018 show no evidence of cor pulmonale or cardiomyopathy INTERVAL HISTORY: Please see below for problem oriented charting. She feels well Denies recent infection No recent lymphadenopathy Denies leg swelling, chest pain or shortness of breath The patient denies any recent signs or symptoms of bleeding such as spontaneous epistaxis, hematuria or hematochezia.   REVIEW OF SYSTEMS:   Constitutional: Denies fevers, chills or abnormal weight loss Eyes: Denies blurriness of vision Ears, nose, mouth, throat, and face: Denies mucositis or sore throat Respiratory: Denies cough, dyspnea or wheezes Cardiovascular: Denies palpitation, chest discomfort or lower extremity swelling Gastrointestinal:  Denies nausea, heartburn or change in bowel habits Skin: Denies abnormal skin rashes Lymphatics: Denies new lymphadenopathy or easy bruising Neurological:Denies numbness, tingling or new weaknesses Behavioral/Psych: Mood is stable, no new changes  All other systems were reviewed with the patient and are negative.  I have reviewed the past medical history, past surgical history, social history and family history with the patient and they are unchanged from previous note.  ALLERGIES:  is allergic to tape; latex; and povidone-iodine.  MEDICATIONS:  Current Outpatient Prescriptions  Medication Sig Dispense Refill  . apixaban (ELIQUIS) 5 MG TABS tablet Take 1 tablet (5  mg total) by mouth 2 (two) times daily. 60 tablet 11   . celecoxib (CELEBREX) 200 MG capsule Take 200 mg by mouth 2 (two) times daily.  9  . diazepam (VALIUM) 5 MG tablet Take 0.5 tablets (2.5 mg total) by mouth every 12 (twelve) hours as needed for anxiety. 30 tablet 0  . DULoxetine (CYMBALTA) 60 MG capsule Take 60 mg by mouth daily.    Marland Kitchen DYMISTA 137-50 MCG/ACT SUSP SHAKE LQ AND U 1 SPR IEN BID  3  . ferrous gluconate (FERGON) 324 MG tablet Take 1 tablet (324 mg total) by mouth 2 (two) times daily with a meal. 60 tablet 0  . fesoterodine (TOVIAZ) 8 MG TB24 tablet Take 8 mg by mouth daily.    Marland Kitchen levocetirizine (XYZAL) 5 MG tablet TK 1 T PO QD IN THE EVE  3  . lisdexamfetamine (VYVANSE) 70 MG capsule Take 70 mg by mouth daily.    . montelukast (SINGULAIR) 10 MG tablet TK 1 T PO QD IN THE EVE  3  . Multiple Vitamin (MULTIVITAMIN) capsule Take 1 capsule by mouth daily.    [name] Bicarbonate (ZEGERID) 20-1100 MG CAPS capsule Take 1 capsule by mouth daily before breakfast.    . pregabalin (LYRICA) 150 MG capsule Take 1 capsule (150 mg total) by mouth daily.    . traZODone (DESYREL) 100 MG tablet   1   No current facility-administered medications for this visit.     PHYSICAL EXAMINATION: ECOG PERFORMANCE STATUS: 0 - Asymptomatic  Vitals:   01/12/17 1303  BP: 117/72  Pulse: (!) 106  Resp: 20  Temp: 98.3 F (36.8 C)   Filed Weights   01/12/17 1303  Weight: 211 lb 11.2 oz (96 kg)    GENERAL:alert, no distress and comfortable SKIN: skin color, texture, turgor are normal, no rashes or significant lesions EYES: normal, Conjunctiva are pink and non-injected, sclera clear OROPHARYNX:no exudate, no erythema and lips, buccal mucosa, and tongue normal  NECK: supple, thyroid normal size, non-tender, without nodularity LYMPH:  no palpable lymphadenopathy in the cervical, axillary or inguinal LUNGS: clear to auscultation and percussion with normal breathing effort HEART: regular rate & rhythm and no murmurs and no lower extremity  edema ABDOMEN:abdomen soft, non-tender and normal bowel sounds Musculoskeletal:no cyanosis of digits and no clubbing  NEURO: alert & oriented x 3 with fluent speech, no focal motor/sensory deficits  LABORATORY DATA:  I have reviewed the data as listed    Component Value Date/Time   NA 145 01/11/2017 1114   K 4.7 01/11/2017 1114   CL 112 (H) 09/28/2016 0405   CL 108 (H) 02/06/2013 1022   CO2 28 01/11/2017 1114   GLUCOSE 94 01/11/2017 1114   GLUCOSE 108 (H) 02/06/2013 1022   BUN 14.4 01/11/2017 1114   CREATININE 0.8 01/11/2017 1114   CALCIUM 9.6 01/11/2017 1114   PROT 6.0 (L) 09/27/2016 0155   PROT 6.3 (L) 05/23/2015 1432   ALBUMIN 3.7 09/27/2016 0155   ALBUMIN 3.8 05/23/2015 1432   AST 30 09/27/2016 0155   AST 15 05/23/2015 1432   ALT 21 09/27/2016 0155   ALT 20 05/23/2015 1432   ALKPHOS 69 09/27/2016 0155   ALKPHOS 90 05/23/2015 1432   BILITOT 0.4 09/27/2016 0155   BILITOT 0.30 05/23/2015 1432   GFRNONAA >60 09/28/2016 0405   GFRAA >60 09/28/2016 0405    No results found for: SPEP, UPEP  Lab Results  Component Value Date   WBC 7.0 01/11/2017   NEUTROABS  4.8 01/11/2017   HGB 14.0 01/11/2017   HCT 41.5 01/11/2017   MCV 93.2 01/11/2017   PLT 241 01/11/2017      Chemistry      Component Value Date/Time   NA 145 01/11/2017 1114   K 4.7 01/11/2017 1114   CL 112 (H) 09/28/2016 0405   CL 108 (H) 02/06/2013 1022   CO2 28 01/11/2017 1114   BUN 14.4 01/11/2017 1114   CREATININE 0.8 01/11/2017 1114      Component Value Date/Time   CALCIUM 9.6 01/11/2017 1114   ALKPHOS 69 09/27/2016 0155   ALKPHOS 90 05/23/2015 1432   AST 30 09/27/2016 0155   AST 15 05/23/2015 1432   ALT 21 09/27/2016 0155   ALT 20 05/23/2015 1432   BILITOT 0.4 09/27/2016 0155   BILITOT 0.30 05/23/2015 1432      ASSESSMENT & PLAN:  History of lymphoma Clinically, she has no evidence to suggest recurrence. I told the patient that is no benefit for routine imaging study unless there are  signs and symptoms suspicious disease recurrence. Since that she is a long-term cancer survivor, she is being discharged She is educated to watch for signs and symptoms of cancer recurrence  Acute saddle pulmonary embolism without acute cor pulmonale ([place]) 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  I do not see a reason to order excessive testing to screen for thrombophilia disorder as it would not change our management.  Even though she has recurrent provoked blood clots, with recent significant saddle PE with cor pulmonale, I recommend she continues anticoagulation therapy lifelong.  Recent echocardiogram show no evidence of cor pulmonale She is doing well on apixaban without bleeding complication I recommend twice a year blood count monitoring through her primary care doctor's office to monitor creatinine function and CBC to exclude bleeding She does not need long-term follow-up here   No orders of the defined types were placed in this encounter.  All questions were answered. The patient knows to call the clinic with any problems, questions or concerns. No barriers to learning was detected. I spent 15 minutes counseling the patient face to face. The total time spent in the appointment was 20 minutes and more than 50% was on counseling and review of test results     Heath Lark, MD 01/12/2017 2:00 PM

## 2017-01-18 ENCOUNTER — Ambulatory Visit: Payer: BC Managed Care – PPO | Admitting: Pulmonary Disease

## 2017-01-19 ENCOUNTER — Encounter: Payer: Self-pay | Admitting: Pulmonary Disease

## 2017-01-19 ENCOUNTER — Ambulatory Visit (INDEPENDENT_AMBULATORY_CARE_PROVIDER_SITE_OTHER): Payer: BC Managed Care – PPO | Admitting: Pulmonary Disease

## 2017-01-19 DIAGNOSIS — I2692 Saddle embolus of pulmonary artery without acute cor pulmonale: Secondary | ICD-10-CM | POA: Diagnosis not present

## 2017-01-19 NOTE — Assessment & Plan Note (Signed)
Her echocardiogram showed no evidence of residual pulmonary hypertension. She still has some fatigue which is to be expected and the sinus tachycardia she had been experiencing in early 2018 has now resolved. It seems that most of this was related to changes in her psych medications.  At this time because of a history of 2 lifetime DVTs I agree with her hematologist that we need to continue lifelong therapy. I will consult with them to see if they feel that it would be okay to decrease the dose of Eliquis to 2.5 mg twice a day now that we are 6 months out from the most recent PE.  She was reminded today the bleeding risk of Eliquis and the fact that we need to continue monitoring her basic metabolic panel for renal function and CBC at least twice a year.  I agree this could be handled by her PCP.

## 2017-01-19 NOTE — Patient Instructions (Signed)
Keep taking your medicines as you are doing for now I will call Dr. Beryle Beams and Dr. Alvy Bimler and discuss your case We will see you back in 6 months or sooner if needed

## 2017-01-19 NOTE — Progress Notes (Signed)
Subjective:    Patient ID: Joyce Payne, female    DOB: 1961-08-08, 56 y.o.   MRN: [id number]  Synopsis hospitalized in September 2017 for large pulmonary embolism requiring catheter directed thromboliysis. Had a history of DVT/PE in 2011 while she had Hodgkin's lymphoma. Echocardiogram during her September 2017 hospitalization showed RVSP of 47 mmHg.  HPI Chief Complaint  Patient presents with  . Follow-up    review echo and stress test.    In mid December she had a bad reaction to some of her medicines and she had to be hospitalized.  It sounds as if she was profoudnly weak at the time.  She was sick for a while after this as she had to stop taking a lot of her medicines.    Her breathing has been OK during this time. She says that walking up a flight of stairs will make her short winded.  She has been working hard to build her strength and has been walking a mile at a time.  She has lost 75 pounds in the last year and at least 20 pounds in the last few months.  She saw Dr. Einar Gip in January and February because her pulse was so high.  He asked that I follow up with her after a repeat echo.    Past Medical History:  Diagnosis Date  . Anxiety   . Arthritis of knee   . 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 Chronic back pain   . Depression   . Depression with anxiety 02/16/2012  . GERD (gastroesophageal reflux disease)   . H/O gastric bypass 02/15/2012   6/88  . History of DVT (deep vein thrombosis) NOV 2011- RIGHT LOWER EXTREMITY  . 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 Hodgkin lymphoma ([place]) STAGE II  VERSUS IVa  ------ ONCOLOGIST- DR Beryle Beams   S/P CHEMOTHERAPY---   IN REMISSION FOR 15 MONTHS  . 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 Hx of pulmonary embolus 02/15/2012   Occurred during chemo for Hodgkin's dis 2011  . Impingement syndrome of left shoulder     . Neuromuscular disorder ([place])   . Peripheral vascular disease ([place])   . Pneumonia   . Recurrent binge eating 02/16/2012   Reason for topomax  . S/P gastric bypass   . Sinus infection   . Sleep apnea   . Spinal headache         Review of Systems  Constitutional: Negative for fever and unexpected weight change.  HENT: Negative for congestion, dental problem, ear pain, nosebleeds, postnasal drip, rhinorrhea, sinus pressure, sneezing, sore throat and trouble swallowing.   Eyes: Negative for redness and itching.  Respiratory: Positive for chest tightness and shortness of breath. Negative for cough and wheezing.   Cardiovascular: Negative for palpitations and leg swelling.       Objective:   Physical Exam Vitals:   01/19/17 1450  BP: 124/66  Pulse: 93  SpO2: 96%  Weight: 209 lb 3.2 oz (94.9 kg)  Height: 5\' 7"  (1.702 m)    RA  Gen: well appearing HENT: OP clear, TM's clear, neck supple PULM: CTA B, normal percussion CV: RRR, no mgr, trace edema GI: BS+, soft, nontender Derm: no cyanosis or rash Psyche: normal mood and affect    CBC    Component Value Date/Time   WBC 7.0  01/11/2017 1114   WBC 7.2 09/28/2016 0405   RBC 4.45 01/11/2017 1114   RBC 3.95 09/28/2016 0405   HGB 14.0 01/11/2017 1114   HCT 41.5 01/11/2017 1114   PLT 241 01/11/2017 1114   MCV 93.2 01/11/2017 1114   MCH 31.4 01/11/2017 1114   MCH 31.4 09/28/2016 0405   MCHC 33.7 01/11/2017 1114   MCHC 33.0 09/28/2016 0405   RDW 13.5 01/11/2017 1114   LYMPHSABS 1.6 01/11/2017 1114   MONOABS 0.5 01/11/2017 1114   EOSABS 0.0 01/11/2017 1114   BASOSABS 0.0 01/11/2017 1114    BMET    Component Value Date/Time   NA 145 01/11/2017 1114   K 4.7 01/11/2017 1114   CL 112 (H) 09/28/2016 0405   CL 108 (H) 02/06/2013 1022   CO2 28 01/11/2017 1114   GLUCOSE 94 01/11/2017 1114   GLUCOSE 108 (H) 02/06/2013 1022   BUN 14.4 01/11/2017 1114   CREATININE 0.8 01/11/2017 1114   CALCIUM 9.6 01/11/2017 1114    GFRNONAA >60 09/28/2016 0405   GFRAA >60 09/28/2016 0405       Assessment & Plan:  Acute saddle pulmonary embolism without acute cor pulmonale ([facility]) Her echocardiogram showed no evidence of residual pulmonary hypertension. She still has some fatigue which is to be expected and the sinus tachycardia she had been experiencing in early 2018 has now resolved. It seems that most of this was related to changes in her psych medications.  At this time because of a history of 2 lifetime DVTs I agree with her hematologist that we need to continue lifelong therapy. I will consult with them to see if they feel that it would be okay to decrease the dose of Eliquis to 2.5 mg twice a day now that we are 6 months out from the most recent PE.  She was reminded today the bleeding risk of Eliquis and the fact that we need to continue monitoring her basic metabolic panel for renal function and CBC at least twice a year.  I agree this could be handled by her PCP.    Current Outpatient Prescriptions:  .  apixaban (ELIQUIS) 5 MG TABS tablet, Take 1 tablet (5 mg total) by mouth 2 (two) times daily., Disp: 60 tablet, Rfl: 11 .  celecoxib (CELEBREX) 200 MG capsule, Take 200 mg by mouth 2 (two) times daily., Disp: , Rfl: 9 .  Cholecalciferol (VITAMIN D3) 2400 UNIT/ML LIQD, Take 2,500 Units by mouth daily., Disp: , Rfl:  .  diazepam (VALIUM) 5 MG tablet, Take 0.5 tablets (2.5 mg total) by mouth every 12 (twelve) hours as needed for anxiety., Disp: 30 tablet, Rfl: 0 .  DULoxetine (CYMBALTA) 60 MG capsule, Take 60 mg by mouth daily., Disp: , Rfl:  .  DYMISTA 137-50 MCG/ACT SUSP, SHAKE LQ AND U 1 SPR IEN BID, Disp: , Rfl: 3 .  ferrous gluconate (FERGON) 324 MG tablet, Take 1 tablet (324 mg total) by mouth 2 (two) times daily with a meal., Disp: 60 tablet, Rfl: 0 .  fesoterodine (TOVIAZ) 8 MG TB24 tablet, Take 8 mg by mouth daily., Disp: , Rfl:  .  levocetirizine (XYZAL) 5 MG tablet, TK 1 T PO QD IN THE EVE, Disp: , Rfl:  3 .  lisdexamfetamine (VYVANSE) 70 MG capsule, Take 70 mg by mouth daily., Disp: , Rfl:  .  montelukast (SINGULAIR) 10 MG tablet, TK 1 T PO QD IN THE EVE, Disp: , Rfl: 3 .  Multiple Vitamin (MULTIVITAMIN) capsule, Take 1 capsule  by mouth daily., Disp: , Rfl:  .  Omeprazole-Sodium Bicarbonate (ZEGERID) 20-1100 MG CAPS capsule, Take 1 capsule by mouth daily before breakfast., Disp: , Rfl:  .  pregabalin (LYRICA) 150 MG capsule, Take 1 capsule (150 mg total) by mouth daily., Disp: , Rfl:  .  traZODone (DESYREL) 100 MG tablet, , Disp: , Rfl: 1

## 2017-01-22 ENCOUNTER — Telehealth: Payer: Self-pay

## 2017-01-22 NOTE — Telephone Encounter (Signed)
-----   Message from Juanito Doom, MD sent at 01/21/2017  5:04 PM EDT ----- A, Please let her know that Dr. Beryle Beams recommended that we keep her on the same dose of Eliquis because she is low risk for bleeding. Thanks B

## 2017-01-22 NOTE — Telephone Encounter (Signed)
Pt returning call and can be reached @ [phone].Joyce Payne

## 2017-01-22 NOTE — Telephone Encounter (Signed)
lmtcb X1 to make pt aware of recs.  

## 2017-01-22 NOTE — Telephone Encounter (Signed)
Called and spoke with pt and she is aware of BQ recs. Nothing further is needed.  

## 2017-09-14 ENCOUNTER — Other Ambulatory Visit: Payer: Self-pay

## 2017-09-14 MED ORDER — APIXABAN 5 MG PO TABS: 5.0000 mg | ORAL_TABLET | Freq: Two times a day (BID) | ORAL | 1 refills | Status: DC

## 2017-12-21 ENCOUNTER — Encounter: Payer: Self-pay | Admitting: Neurology

## 2017-12-23 ENCOUNTER — Encounter: Payer: Self-pay | Admitting: Hematology and Oncology

## 2017-12-24 ENCOUNTER — Telehealth: Payer: Self-pay | Admitting: Hematology and Oncology

## 2017-12-24 NOTE — Telephone Encounter (Signed)
Left message for patient regarding upcoming march appointments per 3/15 sch message

## 2017-12-24 NOTE — Telephone Encounter (Signed)
Left message for patient regarding upcoming march appointments per 3/15 sch message.

## 2017-12-29 ENCOUNTER — Ambulatory Visit: Payer: BC Managed Care – PPO | Admitting: Hematology and Oncology

## 2017-12-29 ENCOUNTER — Other Ambulatory Visit: Payer: Self-pay | Admitting: Family Medicine

## 2017-12-29 DIAGNOSIS — M5412 Radiculopathy, cervical region: Secondary | ICD-10-CM

## 2017-12-29 DIAGNOSIS — R410 Disorientation, unspecified: Secondary | ICD-10-CM

## 2017-12-29 DIAGNOSIS — H538 Other visual disturbances: Secondary | ICD-10-CM

## 2017-12-31 ENCOUNTER — Other Ambulatory Visit: Payer: Self-pay

## 2017-12-31 ENCOUNTER — Emergency Department (HOSPITAL_COMMUNITY): Payer: BC Managed Care – PPO

## 2017-12-31 ENCOUNTER — Encounter (HOSPITAL_COMMUNITY): Payer: Self-pay | Admitting: Radiology

## 2017-12-31 ENCOUNTER — Inpatient Hospital Stay (HOSPITAL_COMMUNITY)
Admission: EM | Admit: 2017-12-31 | Discharge: 2018-01-04 | DRG: 418 | Disposition: A | Discharge status: Home / Self Care | Payer: BC Managed Care – PPO | Attending: Emergency Medicine | Admitting: Emergency Medicine

## 2017-12-31 DIAGNOSIS — Z888 Allergy status to other drugs, medicaments and biological substances status: Secondary | ICD-10-CM

## 2017-12-31 DIAGNOSIS — G4730 Sleep apnea, unspecified: Secondary | ICD-10-CM | POA: Diagnosis present

## 2017-12-31 DIAGNOSIS — G8929 Other chronic pain: Secondary | ICD-10-CM | POA: Diagnosis present

## 2017-12-31 DIAGNOSIS — K8000 Calculus of gallbladder with acute cholecystitis without obstruction: Secondary | ICD-10-CM | POA: Diagnosis present

## 2017-12-31 DIAGNOSIS — Z6834 Body mass index (BMI) 34.0-34.9, adult: Secondary | ICD-10-CM

## 2017-12-31 DIAGNOSIS — K5909 Other constipation: Secondary | ICD-10-CM | POA: Diagnosis present

## 2017-12-31 DIAGNOSIS — R000 Tachycardia, unspecified: Secondary | ICD-10-CM | POA: Diagnosis present

## 2017-12-31 DIAGNOSIS — F418 Other specified anxiety disorders: Secondary | ICD-10-CM | POA: Diagnosis present

## 2017-12-31 DIAGNOSIS — K810 Acute cholecystitis: Secondary | ICD-10-CM | POA: Diagnosis not present

## 2017-12-31 DIAGNOSIS — M7542 Impingement syndrome of left shoulder: Secondary | ICD-10-CM | POA: Diagnosis present

## 2017-12-31 DIAGNOSIS — Z91040 Latex allergy status: Secondary | ICD-10-CM

## 2017-12-31 DIAGNOSIS — I2609 Other pulmonary embolism with acute cor pulmonale: Secondary | ICD-10-CM | POA: Diagnosis not present

## 2017-12-31 DIAGNOSIS — Z8249 Family history of ischemic heart disease and other diseases of the circulatory system: Secondary | ICD-10-CM

## 2017-12-31 DIAGNOSIS — I82409 Acute embolism and thrombosis of unspecified deep veins of unspecified lower extremity: Secondary | ICD-10-CM

## 2017-12-31 DIAGNOSIS — R1011 Right upper quadrant pain: Secondary | ICD-10-CM | POA: Diagnosis not present

## 2017-12-31 DIAGNOSIS — Z9089 Acquired absence of other organs: Secondary | ICD-10-CM

## 2017-12-31 DIAGNOSIS — F17290 Nicotine dependence, other tobacco product, uncomplicated: Secondary | ICD-10-CM | POA: Diagnosis present

## 2017-12-31 DIAGNOSIS — K819 Cholecystitis, unspecified: Secondary | ICD-10-CM

## 2017-12-31 DIAGNOSIS — Z79899 Other long term (current) drug therapy: Secondary | ICD-10-CM

## 2017-12-31 DIAGNOSIS — R51 Headache: Secondary | ICD-10-CM | POA: Diagnosis present

## 2017-12-31 DIAGNOSIS — Z86711 Personal history of pulmonary embolism: Secondary | ICD-10-CM | POA: Diagnosis not present

## 2017-12-31 DIAGNOSIS — R1084 Generalized abdominal pain: Secondary | ICD-10-CM | POA: Diagnosis present

## 2017-12-31 DIAGNOSIS — E611 Iron deficiency: Secondary | ICD-10-CM | POA: Diagnosis present

## 2017-12-31 DIAGNOSIS — Z9221 Personal history of antineoplastic chemotherapy: Secondary | ICD-10-CM | POA: Diagnosis not present

## 2017-12-31 DIAGNOSIS — K219 Gastro-esophageal reflux disease without esophagitis: Secondary | ICD-10-CM | POA: Diagnosis present

## 2017-12-31 DIAGNOSIS — M1712 Unilateral primary osteoarthritis, left knee: Secondary | ICD-10-CM | POA: Diagnosis present

## 2017-12-31 DIAGNOSIS — M542 Cervicalgia: Secondary | ICD-10-CM | POA: Diagnosis present

## 2017-12-31 DIAGNOSIS — Z91048 Other nonmedicinal substance allergy status: Secondary | ICD-10-CM

## 2017-12-31 DIAGNOSIS — I2782 Chronic pulmonary embolism: Secondary | ICD-10-CM | POA: Diagnosis not present

## 2017-12-31 DIAGNOSIS — Z800 Family history of malignant neoplasm of digestive organs: Secondary | ICD-10-CM

## 2017-12-31 DIAGNOSIS — R32 Unspecified urinary incontinence: Secondary | ICD-10-CM | POA: Diagnosis not present

## 2017-12-31 DIAGNOSIS — J309 Allergic rhinitis, unspecified: Secondary | ICD-10-CM | POA: Diagnosis present

## 2017-12-31 DIAGNOSIS — Z86718 Personal history of other venous thrombosis and embolism: Secondary | ICD-10-CM | POA: Diagnosis not present

## 2017-12-31 DIAGNOSIS — C8190 Hodgkin lymphoma, unspecified, unspecified site: Secondary | ICD-10-CM | POA: Diagnosis present

## 2017-12-31 DIAGNOSIS — N3281 Overactive bladder: Secondary | ICD-10-CM | POA: Diagnosis present

## 2017-12-31 DIAGNOSIS — Z9884 Bariatric surgery status: Secondary | ICD-10-CM

## 2017-12-31 DIAGNOSIS — K660 Peritoneal adhesions (postprocedural) (postinfection): Secondary | ICD-10-CM | POA: Diagnosis present

## 2017-12-31 DIAGNOSIS — R1909 Other intra-abdominal and pelvic swelling, mass and lump: Secondary | ICD-10-CM | POA: Diagnosis present

## 2017-12-31 DIAGNOSIS — Z8349 Family history of other endocrine, nutritional and metabolic diseases: Secondary | ICD-10-CM

## 2017-12-31 DIAGNOSIS — Z7901 Long term (current) use of anticoagulants: Secondary | ICD-10-CM

## 2017-12-31 DIAGNOSIS — E669 Obesity, unspecified: Secondary | ICD-10-CM | POA: Diagnosis present

## 2017-12-31 LAB — COMPREHENSIVE METABOLIC PANEL
ALBUMIN: 3.5 g/dL (ref 3.5–5.0)
ALK PHOS: 107 U/L (ref 38–126)
ALT: 27 U/L (ref 14–54)
ANION GAP: 9 (ref 5–15)
AST: 24 U/L (ref 15–41)
BUN: 21 mg/dL — ABNORMAL HIGH (ref 6–20)
CALCIUM: 8.6 mg/dL — AB (ref 8.9–10.3)
CHLORIDE: 101 mmol/L (ref 101–111)
CO2: 25 mmol/L (ref 22–32)
Creatinine, Ser: 0.67 mg/dL (ref 0.44–1.00)
GFR calc non Af Amer: >60 mL/min (ref >60)
GLUCOSE: 146 mg/dL — AB (ref 65–99)
POTASSIUM: 4.2 mmol/L (ref 3.5–5.1)
SODIUM: 135 mmol/L (ref 135–145)
Total Bilirubin: 1 mg/dL (ref 0.3–1.2)
Total Protein: 6.3 g/dL — ABNORMAL LOW (ref 6.5–8.1)

## 2017-12-31 LAB — CBC WITH DIFFERENTIAL/PLATELET
Basophils Absolute: 0 10*3/uL (ref 0.0–0.1)
Basophils Relative: 0 %
Eosinophils Absolute: 0 10*3/uL (ref 0.0–0.7)
Eosinophils Relative: 0 %
HEMATOCRIT: 39.6 % (ref 36.0–46.0)
HEMOGLOBIN: 13.4 g/dL (ref 12.0–15.0)
LYMPHS ABS: 1 10*3/uL (ref 0.7–4.0)
LYMPHS PCT: 6 %
MCH: 31.8 pg (ref 26.0–34.0)
MCHC: 33.8 g/dL (ref 30.0–36.0)
MCV: 93.8 fL (ref 78.0–100.0)
MONO ABS: 1.6 10*3/uL — AB (ref 0.1–1.0)
MONOS PCT: 10 %
NEUTROS ABS: 13.5 10*3/uL — AB (ref 1.7–7.7)
NEUTROS PCT: 84 %
Platelets: 216 10*3/uL (ref 150–400)
RBC: 4.22 MIL/uL (ref 3.87–5.11)
RDW: 12.8 % (ref 11.5–15.5)
WBC: 16.1 10*3/uL — ABNORMAL HIGH (ref 4.0–10.5)

## 2017-12-31 LAB — URINALYSIS, ROUTINE W REFLEX MICROSCOPIC
Bilirubin Urine: NEGATIVE
GLUCOSE, UA: NEGATIVE mg/dL
Hgb urine dipstick: NEGATIVE
Ketones, ur: 5 mg/dL — AB
Leukocytes, UA: NEGATIVE
NITRITE: NEGATIVE
PROTEIN: 100 mg/dL — AB
Specific Gravity, Urine: 1.025 (ref 1.005–1.030)
pH: 5 (ref 5.0–8.0)

## 2017-12-31 LAB — LIPASE, BLOOD: LIPASE: 25 U/L (ref 11–51)

## 2017-12-31 LAB — I-STAT CG4 LACTIC ACID, ED: LACTIC ACID, VENOUS: 1.01 mmol/L (ref 0.5–1.9)

## 2017-12-31 MED ORDER — LISDEXAMFETAMINE DIMESYLATE 50 MG PO CAPS
50.0000 mg | ORAL_CAPSULE | Freq: Every day | ORAL | Status: DC
  Administered 2018-01-01 – 2018-01-04 (×4): 50 mg via ORAL
  Filled 2017-12-31 (×4): qty 1

## 2017-12-31 MED ORDER — SODIUM CHLORIDE 0.9 % IV BOLUS (SEPSIS)
1000.0000 mL | Freq: Once | INTRAVENOUS | Status: AC
  Administered 2017-12-31: 1000 mL via INTRAVENOUS

## 2017-12-31 MED ORDER — SODIUM CHLORIDE 0.9 % IJ SOLN
INTRAMUSCULAR | Status: AC
  Filled 2017-12-31: qty 50

## 2017-12-31 MED ORDER — FESOTERODINE FUMARATE ER 8 MG PO TB24
8.0000 mg | ORAL_TABLET | Freq: Every day | ORAL | Status: DC
  Administered 2017-12-31 – 2018-01-03 (×4): 8 mg via ORAL
  Filled 2017-12-31 (×4): qty 1

## 2017-12-31 MED ORDER — ACETAMINOPHEN 650 MG RE SUPP: 650.0000 mg | Freq: Four times a day (QID) | RECTAL | Status: DC | PRN

## 2017-12-31 MED ORDER — PANTOPRAZOLE SODIUM 40 MG IV SOLR
40.0000 mg | Freq: Every day | INTRAVENOUS | Status: DC
  Administered 2017-12-31 – 2018-01-03 (×4): 40 mg via INTRAVENOUS
  Filled 2017-12-31 (×4): qty 40

## 2017-12-31 MED ORDER — METOPROLOL TARTRATE 5 MG/5ML IV SOLN: 5.0000 mg | Freq: Four times a day (QID) | INTRAVENOUS | Status: DC | PRN

## 2017-12-31 MED ORDER — DULOXETINE HCL 60 MG PO CPEP
90.0000 mg | ORAL_CAPSULE | Freq: Every day | ORAL | Status: DC
  Administered 2018-01-01 – 2018-01-04 (×4): 90 mg via ORAL
  Filled 2017-12-31 (×4): qty 1

## 2017-12-31 MED ORDER — DIAZEPAM 5 MG PO TABS
2.5000 mg | ORAL_TABLET | Freq: Two times a day (BID) | ORAL | Status: DC | PRN
  Administered 2017-12-31 – 2018-01-03 (×4): 2.5 mg via ORAL
  Filled 2017-12-31 (×4): qty 1

## 2017-12-31 MED ORDER — METRONIDAZOLE IN NACL 5-0.79 MG/ML-% IV SOLN
500.0000 mg | Freq: Once | INTRAVENOUS | Status: DC
Start: 2017-12-31 — End: 2017-12-31
  Filled 2017-12-31: qty 100

## 2017-12-31 MED ORDER — ORAL CARE MOUTH RINSE
15.0000 mL | Freq: Two times a day (BID) | OROMUCOSAL | Status: DC
  Administered 2017-12-31 – 2018-01-04 (×5): 15 mL via OROMUCOSAL

## 2017-12-31 MED ORDER — HEPARIN (PORCINE) IN NACL 100-0.45 UNIT/ML-% IJ SOLN: 12.0000 [IU]/kg/h | INTRAMUSCULAR | Status: DC

## 2017-12-31 MED ORDER — SODIUM CHLORIDE 0.9 % IV SOLN
1.0000 g | Freq: Once | INTRAVENOUS | Status: AC
  Administered 2017-12-31: 1 g via INTRAVENOUS
  Filled 2017-12-31: qty 10

## 2017-12-31 MED ORDER — SENNA 8.6 MG PO TABS
1.0000 | ORAL_TABLET | Freq: Two times a day (BID) | ORAL | Status: DC
  Administered 2017-12-31 – 2018-01-04 (×9): 8.6 mg via ORAL
  Filled 2017-12-31 (×9): qty 1

## 2017-12-31 MED ORDER — ONDANSETRON HCL 4 MG/2ML IJ SOLN
4.0000 mg | Freq: Four times a day (QID) | INTRAMUSCULAR | Status: DC | PRN
  Administered 2018-01-01: 4 mg via INTRAVENOUS
  Filled 2017-12-31: qty 2

## 2017-12-31 MED ORDER — TRAMADOL HCL 50 MG PO TABS
100.0000 mg | ORAL_TABLET | Freq: Four times a day (QID) | ORAL | Status: DC | PRN
Start: 2017-12-31 — End: 2018-01-04
  Administered 2018-01-01 – 2018-01-04 (×4): 100 mg via ORAL
  Filled 2017-12-31 (×4): qty 2

## 2017-12-31 MED ORDER — IOPAMIDOL (ISOVUE-300) INJECTION 61%
INTRAVENOUS | Status: AC
  Filled 2017-12-31: qty 100

## 2017-12-31 MED ORDER — PIPERACILLIN-TAZOBACTAM 3.375 G IVPB
3.3750 g | Freq: Three times a day (TID) | INTRAVENOUS | Status: DC
  Administered 2017-12-31 – 2018-01-04 (×11): 3.375 g via INTRAVENOUS
  Filled 2017-12-31 (×12): qty 50

## 2017-12-31 MED ORDER — ADULT MULTIVITAMIN W/MINERALS CH
1.0000 | ORAL_TABLET | Freq: Every day | ORAL | Status: DC
  Administered 2017-12-31 – 2018-01-04 (×5): 1 via ORAL
  Filled 2017-12-31 (×5): qty 1

## 2017-12-31 MED ORDER — ACETAMINOPHEN 325 MG PO TABS
650.0000 mg | ORAL_TABLET | Freq: Once | ORAL | Status: AC
  Administered 2017-12-31: 650 mg via ORAL
  Filled 2017-12-31: qty 2

## 2017-12-31 MED ORDER — IOPAMIDOL (ISOVUE-300) INJECTION 61%
100.0000 mL | Freq: Once | INTRAVENOUS | Status: AC | PRN
  Administered 2017-12-31: 100 mL via INTRAVENOUS

## 2017-12-31 MED ORDER — KETOROLAC TROMETHAMINE 30 MG/ML IJ SOLN: 30.0000 mg | Freq: Four times a day (QID) | INTRAMUSCULAR | Status: DC | PRN

## 2017-12-31 MED ORDER — TRAZODONE HCL 50 MG PO TABS
125.0000 mg | ORAL_TABLET | Freq: Every evening | ORAL | Status: DC | PRN
  Administered 2018-01-01: 125 mg via ORAL
  Filled 2017-12-31: qty 1

## 2017-12-31 MED ORDER — KCL IN DEXTROSE-NACL 20-5-0.45 MEQ/L-%-% IV SOLN
INTRAVENOUS | Status: DC
  Administered 2017-12-31 – 2018-01-03 (×3): via INTRAVENOUS
  Filled 2017-12-31 (×3): qty 1000

## 2017-12-31 MED ORDER — PREGABALIN 50 MG PO CAPS
50.0000 mg | ORAL_CAPSULE | Freq: Two times a day (BID) | ORAL | Status: DC
  Administered 2017-12-31 – 2018-01-04 (×8): 50 mg via ORAL
  Filled 2017-12-31 (×8): qty 1

## 2017-12-31 MED ORDER — HEPARIN (PORCINE) IN NACL 100-0.45 UNIT/ML-% IJ SOLN
1400.0000 [IU]/h | INTRAMUSCULAR | Status: AC
  Administered 2017-12-31 – 2018-01-01 (×2): 1400 [IU]/h via INTRAVENOUS
  Filled 2017-12-31 (×2): qty 250

## 2017-12-31 MED ORDER — MULTIVITAMINS PO CAPS: 1.0000 | ORAL_CAPSULE | Freq: Every day | ORAL | Status: DC

## 2017-12-31 MED ORDER — POLYETHYLENE GLYCOL 3350 17 G PO PACK: 17.0000 g | PACK | Freq: Every day | ORAL | Status: DC | PRN

## 2017-12-31 MED ORDER — ACETAMINOPHEN 325 MG PO TABS
650.0000 mg | ORAL_TABLET | Freq: Four times a day (QID) | ORAL | Status: DC | PRN
  Administered 2017-12-31 – 2018-01-01 (×2): 650 mg via ORAL
  Filled 2017-12-31 (×2): qty 2

## 2017-12-31 MED ORDER — CHLORHEXIDINE GLUCONATE 0.12 % MT SOLN
15.0000 mL | Freq: Two times a day (BID) | OROMUCOSAL | Status: DC
  Administered 2017-12-31 – 2018-01-04 (×8): 15 mL via OROMUCOSAL
  Filled 2017-12-31 (×8): qty 15

## 2017-12-31 MED ORDER — HYDROMORPHONE HCL 1 MG/ML IJ SOLN
1.0000 mg | INTRAMUSCULAR | Status: DC | PRN
  Administered 2017-12-31 – 2018-01-02 (×3): 2 mg via INTRAVENOUS
  Filled 2017-12-31 (×3): qty 2

## 2017-12-31 MED ORDER — MORPHINE SULFATE (PF) 4 MG/ML IV SOLN
4.0000 mg | Freq: Once | INTRAVENOUS | Status: AC
Start: 2017-12-31 — End: 2017-12-31
  Administered 2017-12-31: 4 mg via INTRAVENOUS
  Filled 2017-12-31: qty 1

## 2017-12-31 MED ORDER — OXYCODONE HCL 5 MG PO TABS
5.0000 mg | ORAL_TABLET | ORAL | Status: DC | PRN
  Administered 2018-01-01: 10 mg via ORAL
  Filled 2017-12-31: qty 2

## 2017-12-31 MED ORDER — ONDANSETRON 4 MG PO TBDP
4.0000 mg | ORAL_TABLET | Freq: Four times a day (QID) | ORAL | Status: DC | PRN
  Administered 2018-01-02: 4 mg via ORAL
  Filled 2017-12-31: qty 1

## 2017-12-31 NOTE — ED Notes (Signed)
Pt has been stuck with no success and has a hx of being a difficult stick.

## 2017-12-31 NOTE — H&P (Signed)
Medical Consultation   Joyce Payne  ZOX:[id number]  DOB: 1961-03-20  DOA: 12/31/2017  PCP: Joyce Cruel, MD      Requesting physician: General surgery  Reason for consultation: Medical management as patient is on anticoagulation   History of Present Illness: Joyce Payne is an 57 y.o. 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  Patient started having high fevers and feeling of malaise earlier in the week when she called paramedics.  At that time it was thought she was having some flu type of symptoms therefore was asked to treated with supportive care at home but even following day she started having generalized abdominal pain with continuous fever.  She ended up calling her PCP who advised her to come to the ER for further evaluation.  Patient waited until today to come.  During this time she has had poor oral intake and sharp abdominal pain which is gradually moved to the right upper quadrant.  Her fevers been as high as 105.5.   She was diagnosed of DVT back in 2013 when she was treated with anticoagulation for a year but then she developed large bilateral pulmonary embolism in 2017 requiring thrombolysis.  At first she was placed on Xarelto which was transitioned to Eliquis.  She follows with Joyce Payne.  For the most part she reports of medication compliance and her last dose was today morning around 5 AM.  In the ER she was noted to have elevated white count of 16.1.  Her gallbladder ultrasound is suggestive of acute cholecystitis. Medicine team consulted by general surgery As patient is on anticoagulation.     Review of Systems:  ROS As per HPI otherwise 10 point review of systems negative.   HEENT/EYES = negative for pain, redness, loss of vision, double vision, blurred vision, loss of hearing,  sore throat, hoarseness, dysphagia Cardiovascular= negative for chest pain, palpitation, murmurs, lower extremity swelling Respiratory/lungs= negative for shortness of breath, cough, hemoptysis, wheezing, mucus production Gastrointestinal= negative for melena, hematemesis Genitourinary= negative for Dysuria, Hematuria, Change in Urinary Frequency MSK = Negative for arthralgia, myalgias, Back Pain, Joint swelling  Neurology= Negative for headache, seizures, numbness, tingling  Psychiatry= Negative for anxiety, depression, suicidal and homocidal ideation Allergy/Immunology= Medication/Food allergy as listed  Skin= Negative for Rash, lesions, ulcers, itching   Past Medical History: Past Medical History:  Diagnosis Date  . Anxiety   . Arthritis of knee   . Arthritis of lumbar spine   . Chronic back pain   . Depression   . Depression with anxiety 02/16/2012  . GERD (gastroesophageal reflux disease)   . H/O gastric bypass 02/15/2012   6/88  . History of DVT (deep vein thrombosis) NOV 2011- RIGHT LOWER EXTREMITY  . 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 Hodgkin lymphoma ([place]) STAGE II  VERSUS IVa  ------ ONCOLOGIST- DR Beryle Beams   S/P CHEMOTHERAPY---   IN REMISSION FOR 15 MONTHS  . 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 Hx of pulmonary embolus 02/15/2012   Occurred during chemo for Hodgkin's dis 2011  . Impingement syndrome of left shoulder   . Neuromuscular disorder ([place])   .  Peripheral vascular disease ([place])   . Pneumonia   . Recurrent binge eating 02/16/2012   Reason for topomax  . S/P gastric bypass   . Sinus infection   . Sleep apnea   . Spinal headache     Past Surgical History: Past Surgical History:  Procedure Laterality Date  . GASTRIC BYPASS  1999  . IR GENERIC HISTORICAL  06/25/2016   IR US GUIDE VASC ACCESS RIGHT 06/25/2016 Joyce Mariscal, MD  [facility]  . IR GENERIC HISTORICAL  06/25/2016   IR ANGIOGRAM SELECTIVE EACH ADDITIONAL VESSEL 06/25/2016 Joyce Mariscal, MD [facility]  . IR GENERIC HISTORICAL  06/25/2016   IR ANGIOGRAM SELECTIVE EACH ADDITIONAL VESSEL 06/25/2016 Joyce Mariscal, MD [facility]  . IR GENERIC HISTORICAL  06/25/2016   IR ANGIOGRAM PULMONARY BILATERAL SELECTIVE 06/25/2016 Joyce Mariscal, MD [facility]  . IR GENERIC HISTORICAL  06/25/2016   IR INFUSION THROMBOL ARTERIAL INITIAL (MS) 06/25/2016 Joyce Mariscal, MD [facility]  . IR GENERIC HISTORICAL  06/25/2016   IR INFUSION THROMBOL ARTERIAL INITIAL (MS) 06/25/2016 Joyce Mariscal, MD [facility]  . IR GENERIC HISTORICAL  06/26/2016   IR THROMB F/U EVAL ART/VEN FINAL DAY (MS) 06/26/2016 Aletta Edouard, MD [facility]  . [place] SURGERY  2009  . ORIF TOE FRACTURE Left 06/27/2015   Procedure: OPEN REDUCTION INTERNAL FIXATION (ORIF) LEFT FOURTH AND FIFTH METATARSAL (TOE) FRACTURE NONUNION;  Surgeon: Wylene Simmer, MD;  Location: [place];  Service: Orthopedics;  Laterality: Left;  . PATELLA FRACTURE SURGERY    . PORT-A-CATH INSERTION  2011  . SHOULDER CLOSED REDUCTION  01/18/2012   Procedure: CLOSED MANIPULATION SHOULDER;  Surgeon: Johnn Hai, MD;  Location: [facility];  Service: Orthopedics;  Laterality: Left;  . TONSILECTOMY, ADENOIDECTOMY, BILATERAL MYRINGOTOMY AND TUBES  1985     Allergies:   Allergies  Allergen Reactions  . Tape Other (See Comments)    TEARS THE SKIN (SKIN IS THIN!!)  . Latex Rash  . Povidone-Iodine Rash     Social History:  reports that she quit smoking about 11 years ago. Her smoking use included cigarettes. She has a 25.50 pack-year smoking history. She has quit using smokeless tobacco. She reports that she has current or past drug history. Drugs: Amphetamines, Benzodiazepines, Opium, and Hydrocodone. She reports that she does not drink alcohol.   Family History: Family History  Problem Relation Age of  Onset  . Hypertension Mother   . Hyperlipidemia Mother   . Cancer Father        colon can  . Hypertension Other   . Cancer Other        colon, 1st degree relative<60     Patient also admits that her siblings and mother has DVTs.   Physical Exam: Vitals:   12/31/17 0845 12/31/17 0848 12/31/17 1210  BP:  102/66 126/76  Pulse:  (!) 110 100  Resp:  16 (!) 25  Temp:  (!) 101 F (38.3 C)   TempSrc:  Oral   SpO2: 98% 93% 94%  Weight:  97.1 kg (214 lb)   Height:  5\' 6"  (1.676 m)     Constitutional: She is in mild distress due to abdominal pain Eyes: PERLA, EOMI, irises appear normal, anicteric sclera,  ENMT: external ears and nose appear normal, normal hearing or hard of hearing            Lips appears normal, oropharynx mucosa, tongue, posterior pharynx appear normal  Neck: neck appears normal, no masses,  normal ROM, no thyromegaly, no JVD  CVS: S1-S2 clear, no murmur rubs or gallops, no LE edema, normal pedal pulses  Respiratory:  clear to auscultation bilaterally, no wheezing, rales or rhonchi. Respiratory effort normal. No accessory muscle use.  Abdomen: Abdominal tenderness to deep palpation, positive bowel sounds.  Previous abdominal scar noted Musculoskeletal: : no cyanosis, clubbing or edema noted bilaterally         Neuro: Cranial nerves II-XII intact, strength, sensation, reflexes Psych: judgement and insight appear normal, stable mood and affect, mental status Skin: no rashes or lesions or ulcers, no induration or nodules    Data reviewed:  I have personally reviewed following labs and imaging studies Labs:  CBC: Recent Labs  Lab 12/31/17 1045  WBC 16.1*  NEUTROABS 13.5*  HGB 13.4  HCT 39.6  MCV 93.8  PLT 329    Basic Metabolic Panel: Recent Labs  Lab 12/31/17 1045  NA 135  K 4.2  CL 101  CO2 25  GLUCOSE 146*  BUN 21*  CREATININE 0.67  CALCIUM 8.6*   GFR Estimated Creatinine Clearance: 92.2 mL/min (by C-G formula based on SCr of 0.67  mg/dL). Liver Function Tests: Recent Labs  Lab 12/31/17 1045  AST 24  ALT 27  ALKPHOS 107  BILITOT 1.0  PROT 6.3*  ALBUMIN 3.5   Recent Labs  Lab 12/31/17 1045  LIPASE 25   No results for input(s): AMMONIA in the last 168 hours. Coagulation profile No results for input(s): INR, PROTIME in the last 168 hours.  Cardiac Enzymes: No results for input(s): CKTOTAL, CKMB, CKMBINDEX, TROPONINI in the last 168 hours. BNP: Invalid input(s): POCBNP CBG: No results for input(s): GLUCAP in the last 168 hours. D-Dimer No results for input(s): DDIMER in the last 72 hours. Hgb A1c No results for input(s): HGBA1C in the last 72 hours. Lipid Profile No results for input(s): CHOL, HDL, LDLCALC, TRIG, CHOLHDL, LDLDIRECT in the last 72 hours. Thyroid function studies No results for input(s): TSH, T4TOTAL, T3FREE, THYROIDAB in the last 72 hours.  Invalid input(s): FREET3 Anemia work up No results for input(s): VITAMINB12, FOLATE, FERRITIN, TIBC, IRON, RETICCTPCT in the last 72 hours. Urinalysis    Component Value Date/Time   COLORURINE AMBER (A) 12/31/2017 1038   APPEARANCEUR CLEAR 12/31/2017 1038   LABSPEC 1.025 12/31/2017 1038   PHURINE 5.0 12/31/2017 1038   GLUCOSEU NEGATIVE 12/31/2017 1038   GLUCOSEU NEGATIVE 09/09/2011 1356   HGBUR NEGATIVE 12/31/2017 1038   BILIRUBINUR NEGATIVE 12/31/2017 1038   KETONESUR 5 (A) 12/31/2017 1038   PROTEINUR 100 (A) 12/31/2017 1038   UROBILINOGEN 0.2 09/09/2011 1356   NITRITE NEGATIVE 12/31/2017 1038   LEUKOCYTESUR NEGATIVE 12/31/2017 1038     Microbiology No results found for this or any previous visit (from the past 240 hour(s)).     Inpatient Medications:   Scheduled Meds: . [START ON 01/01/2018] DULoxetine  90 mg Oral Daily  . fesoterodine  8 mg Oral QHS  . iopamidol      . lisdexamfetamine  50 mg Oral Daily  . multivitamin with minerals  1 tablet Oral Daily  . pantoprazole (PROTONIX) IV  40 mg Intravenous QHS  . pregabalin  50  mg Oral BID  . senna  1 tablet Oral BID  . sodium chloride       Continuous Infusions: . dextrose 5 % and 0.45 % NaCl with KCl 20 mEq/L    . piperacillin-tazobactam (ZOSYN)  IV 3.375 g (12/31/17 1412)     Radiological Exams  on Admission: Dg Chest 2 View  Result Date: 12/31/2017 CLINICAL DATA:  Fever of 103.1.  Abdominal pain. EXAM: CHEST - 2 VIEW COMPARISON:  12/04/2016. FINDINGS: Poor inspiration. Grossly stable normal sized heart. Interval linear and patchy density in the left lower lobe and probable small left pleural effusion. Clear right lung. Multiple old, healed right rib fractures. IMPRESSION: Interval left lower lobe atelectasis and possible pneumonia with a probable small left pleural effusion. Electronically Signed   By: Claudie Revering M.D.   On: 12/31/2017 10:18   Ct Abdomen Pelvis W Contrast  Result Date: 12/31/2017 CLINICAL DATA:  Acute abdominal pain, fever, history of lymphoma now in remission, elevated white count EXAM: CT ABDOMEN AND PELVIS WITH CONTRAST TECHNIQUE: Multidetector CT imaging of the abdomen and pelvis was performed using the standard protocol following bolus administration of intravenous contrast. CONTRAST:  162mL ISOVUE-300 IOPAMIDOL (ISOVUE-300) INJECTION 61% COMPARISON:  02/02/2017 FINDINGS: Lower chest: Bandlike bibasilar atelectasis, worse on the left. No significant pleural effusion. Normal heart size. No pericardial effusion. Hepatobiliary: Small left hepatic dome hypodense cysts, unchanged. Mild central biliary prominence. No other focal hepatic abnormality. Hepatic and portal veins remain patent. Gallbladder is distended with mucosal enhancement mild surrounding inflammatory changes, and trace pericholecystic fluid. Appearance compatible with acute cholecystitis. Adjacent inflammatory reactive changes noted along the duodenum, pancreatic head, and adjacent colon. No fluid collection or abscess. Pancreas: No focal abnormality or ductal dilatation. Spleen: Normal  in size without focal abnormality. Adrenals/Urinary Tract: Adrenal glands are unremarkable. Kidneys are normal, without renal calculi, focal lesion, or hydronephrosis. Bladder is unremarkable. Stomach/Bowel: postop changes from gastric bypass. Reactive inflammatory changes around duodenum in the right upper quadrant to cholecystitis. Negative for bowel obstruction, significant dilatation, ileus, free air. Appendix is unremarkable. Vascular/Lymphatic: Aortic atherosclerosis. Negative for aneurysm. Adenopathy. Reproductive: Midline pelvic complex partially cystic mass measuring 7 x 9 cm, image 67 series 2. Consider follow-up evaluation with transvaginal pelvic ultrasound further characterization. 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 Other: No inguinal or abdominal wall hernia. No significant ascites. Musculoskeletal: Lower lumbar degenerative changes noted. No acute compression fracture. IMPRESSION: Distended gallbladder with mucosal enhancement surrounding inflammatory changes, and trace pericholecystic fluid compatible with acute cholecystitis. These results were called by telephone at the time of interpretation on 12/31/2017 at 12:06 pm to Dr. Aletta Edouard , who verbally acknowledged these results. No associated biliary obstruction or dilatation. 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 This recommendation follows ACR consensus guidelines: White Paper of the ACR Incidental Findings Committee II on Adnexal Findings. J Am Coll Radiol [phone]. Electronically Signed   By: Jerilynn Mages.  Shick M.D.   On: 12/31/2017 12:07   US Abdomen Limited  Result Date: 12/31/2017 CLINICAL DATA:  Cholecystitis by CT EXAM: ULTRASOUND ABDOMEN LIMITED RIGHT UPPER QUADRANT  COMPARISON:  12/31/2017 FINDINGS: Gallbladder: Moderate gallbladder distention with mild wall thickening measuring 3.3 mm. Trace pericholecystic fluid. Murphys sign is elicited over the gallbladder during the exam. Gallbladder sludge noted. Findings compatible with cholecystitis. Common bile duct: Diameter: 6 mm. Liver: No focal abnormality. Normal echogenicity. Mild diffuse intrahepatic biliary dilatation. Portal vein is patent on color Doppler imaging with normal direction of blood flow towards the liver. IMPRESSION: Distended gallbladder with mild wall thickening, ultrasound findings suggesting acute cholecystitis as above. Mild intrahepatic biliary dilatation Electronically Signed   By: Jerilynn Mages.  Shick M.D.   On: 12/31/2017 14:17    Impression/Recommendations Active Problems:   Acute cholecystitis  Acute cholecystitis  right-sided abdominal pain with nausea -Patient admitted to general surgery team -Trend LFTs, would continue IV antibiotics.  Patient is currently on Zosyn - Provide supportive care, antiemetics, pain control -Rest of the plan per general surgery.  Her last dose of Eliquis was this morning.  History of bilateral pulmonary embolism History of DVT -At this point would recommend starting patient on heparin drip and let Eliquis wear off.  Would recommend stopping heparin drip about 4-6 hours prior to her surgery and then restarting once cleared by surgery.  Previously it has been determined the patient will be on anticoagulation for lifelong -Pharmacy consult order placed to start patient on heparin drip without bolus. -Currently she is not having any symptoms of shortness of breath, chest pain  Hodgkin's lymphoma in remission -She is status post chemotherapy.  Currently in remission.  - Thank you for this consultation.  Our [facility] hospitalist team will follow the patient with you.   Time Spent: 40 mins  Eban Weick Arsenio Loader M.D. [facility] 12/31/2017, 2:35 PM

## 2017-12-31 NOTE — ED Notes (Signed)
Main lab called to attempt blood collection

## 2017-12-31 NOTE — Progress Notes (Signed)
Clarita for IV heparin Indication: Hx VTE on Eliquis; perioperative bridging  Allergies  Allergen Reactions  . Tape Other (See Comments)    TEARS THE SKIN (SKIN IS THIN!!)  . Latex Rash  . Povidone-Iodine Rash    Patient Measurements: Height: 5\' 6"  (167.6 cm) Weight: 214 lb (97.1 kg) IBW/kg (Calculated) : 59.3 Heparin Dosing Weight: 81  Vital Signs: Temp: 98.3 F (36.8 C) (03/22 1525) Temp Source: Oral (03/22 1525) BP: 126/76 (03/22 1210) Pulse Rate: 100 (03/22 1210)  Labs: Recent Labs    12/31/17 1045  HGB 13.4  HCT 39.6  PLT 216  CREATININE 0.67    Estimated Creatinine Clearance: 92.2 mL/min (by C-G formula based on SCr of 0.67 mg/dL).   Medical History: Past Medical History:  Diagnosis Date  . Anxiety   . Arthritis of knee   . Arthritis of lumbar spine   . Chronic back pain   . Depression   . Depression with anxiety 02/16/2012  . GERD (gastroesophageal reflux disease)   . H/O gastric bypass 02/15/2012   6/88  . History of DVT (deep vein thrombosis) NOV 2011- RIGHT LOWER EXTREMITY  . 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 Hodgkin lymphoma ([place]) STAGE II  VERSUS IVa  ------ ONCOLOGIST- DR Beryle Beams   S/P CHEMOTHERAPY---   IN REMISSION FOR 15 MONTHS  . Hodgkin's lymphoma, nodular sclerosis ([place]) 02/15/2012   IIA NS Hodgkin's dx 6/11 [place] 6 ABVD [place]  . Hx of pulmonary embolus 02/15/2012   Occurred during chemo for Hodgkin's dis 2011  . Impingement syndrome of left shoulder   . Neuromuscular disorder ([place])   . Peripheral vascular disease ([place])   . Pneumonia   . Recurrent binge eating 02/16/2012   Reason for topomax  . S/P gastric bypass   . Sinus infection   . Sleep apnea   . Spinal headache     Medications:   (Not in a hospital admission) Scheduled:  . [START ON 01/01/2018] DULoxetine  90 mg Oral Daily  . fesoterodine  8 mg Oral QHS  . iopamidol       . lisdexamfetamine  50 mg Oral Daily  . multivitamin with minerals  1 tablet Oral Daily  . pantoprazole (PROTONIX) IV  40 mg Intravenous QHS  . pregabalin  50 mg Oral BID  . senna  1 tablet Oral BID  . sodium chloride       Infusions:  . dextrose 5 % and 0.45 % NaCl with KCl 20 mEq/L    . heparin    . piperacillin-tazobactam (ZOSYN)  IV 3.375 g (12/31/17 1412)    Assessment: 69 yoF with PMH Hodgkin's Lymphoma in remission, recurrent PE/DVT on Eliquis, GERD, gastric bypass '99, admitted for acute cholecystitis. Pharmacy consulted to dose heparin while off Eliquis perioperatively. Tentative plan is for Lap choley 48 hrs after last Eliquis if conservative management fails.   Baseline INR, aPTT: not done  Prior anticoagulation: Eliquis 5 mg bid, LD 3/22 at 0500  Significant events:  Today, 12/31/2017:  CBC: WNL  No bleeding or infusion issues per nursing  CrCl: 92 ml/min  Goal of Therapy: Heparin level 0.3-0.7 units/ml Monitor platelets by anticoagulation protocol: Yes  Plan:  No bolus following Eliquis  Heparin 1400 units/hr IV infusion  Check heparin level 6 hrs after start  Daily CBC, daily heparin level once stable  Monitor for signs of bleeding or thrombosis  Reuel Boom, PharmD, BCPS [phone] 12/31/2017, 3:41 PM

## 2017-12-31 NOTE — ED Notes (Signed)
ED TO INPATIENT HANDOFF REPORT  Name/Age/Gender Joyce Payne 57 y.o. female  Code Status    Code Status Orders  (From admission, onward)        Start     Ordered   12/31/17 1340  Full code  Continuous     12/31/17 1343    Code Status History    Date Active Date Inactive Code Status Order ID Comments User Context   09/27/2016 0421 09/28/2016 1510 Full Code [id number]  Etta Quill, DO ED   06/24/2016 2201 06/27/2016 1626 Full Code [id number]  Rigoberto Noel, MD ED      Home/SNF/Other Home  Chief Complaint D  Level of Care/Admitting Diagnosis ED Disposition    ED Disposition Condition [facility] Area: [facility] [[id number]]  Level of Care: Med-Surg [16]  Diagnosis: Acute cholecystitis [575.0.ICD-9-CM]  Admitting Physician: Armandina Gemma Lorry.Blower  Attending Physician: CCS, MD [[id number]]  Estimated length of stay: past midnight tomorrow  Certification:: I certify this patient will need inpatient services for at least 2 midnights  Bed request comments: 5W  PT Class (Do Not Modify): Inpatient [101]  PT Acc Code (Do Not Modify): Private [1]       Medical History Past Medical History:  Diagnosis Date  . Anxiety   . Arthritis of knee   . Arthritis of lumbar spine   . Chronic back pain   . Depression   . Depression with anxiety 02/16/2012  . GERD (gastroesophageal reflux disease)   . H/O gastric bypass 02/15/2012   6/88  . History of DVT (deep vein thrombosis) NOV 2011- RIGHT LOWER EXTREMITY  . 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 Hx of pulmonary embolus 02/15/2012   Occurred during chemo for Hodgkin's dis 2011  . Impingement syndrome of left  shoulder   . Neuromuscular disorder ([place])   . Peripheral vascular disease ([place])   . Pneumonia   . Recurrent binge eating 02/16/2012   Reason for topomax  . S/P gastric bypass   . Sinus infection   . Sleep apnea   . Spinal headache     Allergies Allergies  Allergen Reactions  . Tape Other (See Comments)    TEARS THE SKIN (SKIN IS THIN!!)  . Latex Rash  . Povidone-Iodine Rash    IV Location/Drains/Wounds Patient Lines/Drains/Airways Status   Active Line/Drains/Airways    Name:   Placement date:   Placement time:   Site:   Days:   Peripheral IV 12/31/17 Right;Lateral Forearm   12/31/17    1057    Forearm   less than 1          Labs/Imaging Results for orders placed or performed during the hospital encounter of 12/31/17 (from the past 48 hour(s))  Urinalysis, Routine w reflex microscopic     Status: Abnormal   Collection Time: 12/31/17 10:38 AM  Result Value Ref Range   Color, Urine AMBER (A) YELLOW    Comment: BIOCHEMICALS MAY BE AFFECTED BY COLOR   APPearance CLEAR CLEAR   Specific Gravity, Urine 1.025 1.005 - 1.030   pH 5.0 5.0 - 8.0   Glucose, UA NEGATIVE  NEGATIVE mg/dL   Hgb urine dipstick NEGATIVE NEGATIVE   Bilirubin Urine NEGATIVE NEGATIVE   Ketones, ur 5 (A) NEGATIVE mg/dL   Protein, ur 100 (A) NEGATIVE mg/dL   Nitrite NEGATIVE NEGATIVE   Leukocytes, UA NEGATIVE NEGATIVE   RBC / HPF 6-30 0 - 5 RBC/hpf   WBC, UA 0-5 0 - 5 WBC/hpf   Bacteria, UA FEW (A) NONE SEEN   Squamous Epithelial / LPF 0-5 (A) NONE SEEN   Mucus PRESENT    Hyaline Casts, UA PRESENT     Comment: Performed at [facility], [place] [address], [place], [place] [address]  Comprehensive metabolic panel     Status: Abnormal   Collection Time: 12/31/17 10:45 AM  Result Value Ref Range   Sodium 135 135 - 145 mmol/L   Potassium 4.2 3.5 - 5.1 mmol/L   Chloride 101 101 - 111 mmol/L   CO2 25 22 - 32 mmol/L   Glucose, Bld 146 (H) 65 - 99 mg/dL   BUN 21 (H) 6 - 20 mg/dL    Creatinine, Ser 0.67 0.44 - 1.00 mg/dL   Calcium 8.6 (L) 8.9 - 10.3 mg/dL   Total Protein 6.3 (L) 6.5 - 8.1 g/dL   Albumin 3.5 3.5 - 5.0 g/dL   AST 24 15 - 41 U/L   ALT 27 14 - 54 U/L   Alkaline Phosphatase 107 38 - 126 U/L   Total Bilirubin 1.0 0.3 - 1.2 mg/dL   GFR calc non Af Amer >60 >60 mL/min   GFR calc Af Amer >60 >60 mL/min    Comment: (NOTE) The eGFR has been calculated using the CKD EPI equation. This calculation has not been validated in all clinical situations. eGFR's persistently <60 mL/min signify possible Chronic Kidney Disease.    Anion gap 9 5 - 15    Comment: Performed at [facility], [place] [address] [place], [place] [address]  CBC WITH DIFFERENTIAL     Status: Abnormal   Collection Time: 12/31/17 10:45 AM  Result Value Ref Range   WBC 16.1 (H) 4.0 - 10.5 K/uL   RBC 4.22 3.87 - 5.11 MIL/uL   Hemoglobin 13.4 12.0 - 15.0 g/dL   HCT 39.6 36.0 - 46.0 %   MCV 93.8 78.0 - 100.0 fL   MCH 31.8 26.0 - 34.0 pg   MCHC 33.8 30.0 - 36.0 g/dL   RDW 12.8 11.5 - 15.5 %   Platelets 216 150 - 400 K/uL   Neutrophils Relative % 84 %   Neutro Abs 13.5 (H) 1.7 - 7.7 K/uL   Lymphocytes Relative 6 %   Lymphs Abs 1.0 0.7 - 4.0 K/uL   Monocytes Relative 10 %   Monocytes Absolute 1.6 (H) 0.1 - 1.0 K/uL   Eosinophils Relative 0 %   Eosinophils Absolute 0.0 0.0 - 0.7 K/uL   Basophils Relative 0 %   Basophils Absolute 0.0 0.0 - 0.1 K/uL    Comment: Performed at [facility], [place] [address] [place], [place] [address]  Lipase, blood     Status: None   Collection Time: 12/31/17 10:45 AM  Result Value Ref Range   Lipase 25 11 - 51 U/L    Comment: Performed at [facility], [place] [address], [place], [place] [address]  I-Stat CG4 Lactic Acid, ED  (not at  [facility])     Status: None   Collection Time: 12/31/17 10:48 AM  Result Value Ref Range   Lactic Acid,  Venous 1.01 0.5 - 1.9 mmol/L   Dg Chest 2 View  Result Date:  12/31/2017 CLINICAL DATA:  Fever of 103.1.  Abdominal pain. EXAM: CHEST - 2 VIEW COMPARISON:  12/04/2016. FINDINGS: Poor inspiration. Grossly stable normal sized heart. Interval linear and patchy density in the left lower lobe and probable small left pleural effusion. Clear right lung. Multiple old, healed right rib fractures. IMPRESSION: Interval left lower lobe atelectasis and possible pneumonia with a probable small left pleural effusion. Electronically Signed   By: Claudie Revering M.D.   On: 12/31/2017 10:18   Ct Abdomen Pelvis W Contrast  Result Date: 12/31/2017 CLINICAL DATA:  Acute abdominal pain, fever, history of lymphoma now in remission, elevated white count EXAM: CT ABDOMEN AND PELVIS WITH CONTRAST TECHNIQUE: Multidetector CT imaging of the abdomen and pelvis was performed using the standard protocol following bolus administration of intravenous contrast. CONTRAST:  164m ISOVUE-300 IOPAMIDOL (ISOVUE-300) INJECTION 61% COMPARISON:  02/02/2017 FINDINGS: Lower chest: Bandlike bibasilar atelectasis, worse on the left. No significant pleural effusion. Normal heart size. No pericardial effusion. Hepatobiliary: Small left hepatic dome hypodense cysts, unchanged. Mild central biliary prominence. No other focal hepatic abnormality. Hepatic and portal veins remain patent. Gallbladder is distended with mucosal enhancement mild surrounding inflammatory changes, and trace pericholecystic fluid. Appearance compatible with acute cholecystitis. Adjacent inflammatory reactive changes noted along the duodenum, pancreatic head, and adjacent colon. No fluid collection or abscess. Pancreas: No focal abnormality or ductal dilatation. Spleen: Normal in size without focal abnormality. Adrenals/Urinary Tract: Adrenal glands are unremarkable. Kidneys are normal, without renal calculi, focal lesion, or hydronephrosis. Bladder is unremarkable. Stomach/Bowel: postop changes from gastric bypass. Reactive inflammatory changes around  duodenum in the right upper quadrant to cholecystitis. Negative for bowel obstruction, significant dilatation, ileus, free air. Appendix is unremarkable. Vascular/Lymphatic: Aortic atherosclerosis. Negative for aneurysm. Adenopathy. Reproductive: Midline pelvic complex partially cystic mass measuring 7 x 9 cm, image 67 series 2. Consider follow-up evaluation with transvaginal pelvic ultrasound further characterization. 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 Other: No inguinal or abdominal wall hernia. No significant ascites. Musculoskeletal: Lower lumbar degenerative changes noted. No acute compression fracture. IMPRESSION: Distended gallbladder with mucosal enhancement surrounding inflammatory changes, and trace pericholecystic fluid compatible with acute cholecystitis. These results were called by telephone at the time of interpretation on 12/31/2017 at 12:06 pm to Dr. MAletta Edouard, who verbally acknowledged these results. No associated biliary obstruction or dilatation. 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 This recommendation follows ACR consensus guidelines: White Paper of the ACR Incidental Findings Committee II on Adnexal Findings. J Am Coll Radiol [phone] Electronically Signed   By: MJerilynn Mages  Shick M.D.   On: 12/31/2017 12:07   UKoreaAbdomen Limited  Result Date: 12/31/2017 CLINICAL DATA:  Cholecystitis by CT EXAM: ULTRASOUND ABDOMEN LIMITED RIGHT UPPER QUADRANT COMPARISON:  12/31/2017 FINDINGS: Gallbladder: Moderate gallbladder distention with mild wall thickening measuring 3.3 mm. Trace pericholecystic fluid. Murphys sign is elicited over the gallbladder during the exam. Gallbladder sludge noted. Findings compatible with cholecystitis.  Common bile duct: Diameter: 6 mm. Liver: No focal abnormality. Normal echogenicity. Mild diffuse intrahepatic biliary dilatation. Portal vein is patent on color Doppler imaging with normal direction of blood flow towards the liver. IMPRESSION: Distended gallbladder with mild wall thickening, ultrasound findings suggesting acute cholecystitis as above. Mild intrahepatic biliary  dilatation Electronically Signed   By: Jerilynn Mages.  Shick M.D.   On: 12/31/2017 14:17    Pending Labs Unresulted Labs (From admission, onward)   Start     Ordered   01/01/18 0500  Comprehensive metabolic panel  Tomorrow morning,   R     12/31/17 1343   01/01/18 0500  CBC  Tomorrow morning,   R     12/31/17 1343   12/31/17 1340  HIV antibody (Routine Testing)  Once,   R     12/31/17 1343   12/31/17 0913  Blood Culture (routine x 2)  BLOOD CULTURE X 2,   STAT     12/31/17 0916      Vitals/Pain Today's Vitals   12/31/17 0845 12/31/17 0848 12/31/17 0859 12/31/17 1210  BP:  102/66  126/76  Pulse:  (!) 110  100  Resp:  16  (!) 25  Temp:  (!) 101 F (38.3 C)    TempSrc:  Oral    SpO2: 98% 93%  94%  Weight:  214 lb (97.1 kg)    Height:  5' 6" (1.676 m)    PainSc:   7      Isolation Precautions No active isolations  Medications Medications  iopamidol (ISOVUE-300) 61 % injection (has no administration in time range)  sodium chloride 0.9 % injection (has no administration in time range)  piperacillin-tazobactam (ZOSYN) IVPB 3.375 g (3.375 g Intravenous New Bag/Given 12/31/17 1412)  dextrose 5 % and 0.45 % NaCl with KCl 20 mEq/L infusion (has no administration in time range)  acetaminophen (TYLENOL) tablet 650 mg (has no administration in time range)    Or  acetaminophen (TYLENOL) suppository 650 mg (has no administration in time range)  ketorolac (TORADOL) 30 MG/ML injection 30 mg (has no administration in time range)  oxyCODONE (Oxy IR/ROXICODONE) immediate release tablet 5-10 mg (has no administration in time range)   HYDROmorphone (DILAUDID) injection 1-2 mg (has no administration in time range)  senna (SENOKOT) tablet 8.6 mg (has no administration in time range)  polyethylene glycol (MIRALAX / GLYCOLAX) packet 17 g (has no administration in time range)  ondansetron (ZOFRAN-ODT) disintegrating tablet 4 mg (has no administration in time range)    Or  ondansetron (ZOFRAN) injection 4 mg (has no administration in time range)  pantoprazole (PROTONIX) injection 40 mg (has no administration in time range)  metoprolol tartrate (LOPRESSOR) injection 5 mg (has no administration in time range)  diazepam (VALIUM) tablet 2.5 mg (has no administration in time range)  DULoxetine (CYMBALTA) DR capsule 90 mg (has no administration in time range)  fesoterodine (TOVIAZ) tablet 8 mg (has no administration in time range)  lisdexamfetamine (VYVANSE) capsule 50 mg (has no administration in time range)  pregabalin (LYRICA) capsule 50 mg (has no administration in time range)  traZODone (DESYREL) tablet 125 mg (has no administration in time range)  traMADol (ULTRAM) tablet 100 mg (has no administration in time range)  multivitamin with minerals tablet 1 tablet (has no administration in time range)  sodium chloride 0.9 % bolus 1,000 mL (0 mLs Intravenous Stopped 12/31/17 1227)  acetaminophen (TYLENOL) tablet 650 mg (650 mg Oral Given 12/31/17 1013)  iopamidol (ISOVUE-300) 61 % injection 100 mL (100 mLs Intravenous Contrast Given 12/31/17 1123)  cefTRIAXone (ROCEPHIN) 1 g in sodium chloride 0.9 % 100 mL IVPB (0 g Intravenous Stopped 12/31/17 1345)  sodium chloride 0.9 % bolus 1,000 mL (0 mLs Intravenous Stopped 12/31/17 1413)  morphine 4 MG/ML injection 4 mg (4 mg Intravenous  Given 12/31/17 1300)    Mobility walks with person assist

## 2017-12-31 NOTE — ED Notes (Signed)
Blood draw attempt unsuccessful 

## 2017-12-31 NOTE — H&P (Signed)
[facility] Admission Note  Joyce Payne [birth year]  [id number].    Requesting MD: Melina Copa Chief Complaint/Reason for Consult: abdominal pain and fevers HPI:  Patient is a 57 year old female who presented to [facility] with 4 days of fevers, abdominal pain, and nausea. Pain is sharp in nature and mostly located in the upper abdomen, slightly worse on the right side. Patient reports fevers as high as 103-105 intermittently at home. Patient denies vomiting, diarrhea, chest pain, palpitations. PMH significant for PE in 2013 and again in 2017 with current anticoagulation on Eliquis. Her last dose of eliquis was this AM. She also has a hx of Hodgkin's Lymphoma which was treated with chemotherapy in 2013. 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 Surgical history significant for gastric bypass in 1999. Patient denies allergies to medications. Denies alcohol, illicit drug or tobacco use. Does report that she vapes.  Patient's wife present at bedside and assisted with patient history.   ROS: Review of Systems  Constitutional: Positive for chills, fever and malaise/fatigue.  Respiratory: Negative for shortness of breath (pain with deep inspiration ).   Cardiovascular: Negative for chest pain and palpitations.  Gastrointestinal: Positive for abdominal pain, constipation (chronic) and nausea. Negative for blood in stool, diarrhea, melena and vomiting.  Musculoskeletal: Positive for back pain, joint pain, myalgias and neck pain.    Family History  Problem Relation Age of Onset  . Hypertension Mother   . Hyperlipidemia Mother   . Cancer Father        colon can  . Hypertension Other   . Cancer Other        colon, 1st degree relative<60    Past Medical History:  Diagnosis Date  . Anxiety   . Arthritis of knee   . Arthritis of lumbar spine   . Chronic back pain   . Depression   . Depression with anxiety 02/16/2012  .  GERD (gastroesophageal reflux disease)   . H/O gastric bypass 02/15/2012   6/88  . History of DVT (deep vein thrombosis) NOV 2011- RIGHT LOWER EXTREMITY  . 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 Hx of pulmonary embolus 02/15/2012   Occurred during chemo for Hodgkin's dis 2011  . Impingement syndrome of left shoulder   . Neuromuscular disorder ([place])   . Peripheral vascular disease ([place])   . Pneumonia   . Recurrent binge eating 02/16/2012   Reason for topomax  . S/P gastric bypass   . Sinus infection   . Sleep apnea   . Spinal headache     Past Surgical History:  Procedure Laterality Date  . GASTRIC BYPASS  1999  . IR GENERIC HISTORICAL  06/25/2016   IR US GUIDE VASC ACCESS RIGHT 06/25/2016 Sandi Mariscal, MD [facility]  . IR GENERIC HISTORICAL  06/25/2016   IR ANGIOGRAM SELECTIVE EACH ADDITIONAL VESSEL 06/25/2016 Sandi Mariscal, MD [facility]  . IR GENERIC HISTORICAL  06/25/2016   IR ANGIOGRAM SELECTIVE EACH ADDITIONAL VESSEL 06/25/2016 Sandi Mariscal, MD [facility]  . IR GENERIC HISTORICAL  06/25/2016   IR ANGIOGRAM PULMONARY BILATERAL SELECTIVE 06/25/2016 Sandi Mariscal, MD [facility]  .  IR GENERIC HISTORICAL  06/25/2016   IR INFUSION THROMBOL ARTERIAL INITIAL (MS) 06/25/2016 Sandi Mariscal, MD [facility]  . IR GENERIC HISTORICAL  06/25/2016   IR INFUSION THROMBOL ARTERIAL INITIAL (MS) 06/25/2016 Sandi Mariscal, MD [facility]  . IR GENERIC HISTORICAL  06/26/2016   IR THROMB F/U EVAL ART/VEN FINAL DAY (MS) 06/26/2016 Aletta Edouard, MD [facility]  . [place] [facility]ERY  2009  . ORIF TOE FRACTURE Left 06/27/2015   Procedure: OPEN REDUCTION INTERNAL FIXATION (ORIF) LEFT FOURTH AND FIFTH METATARSAL (TOE)  FRACTURE NONUNION;  Surgeon: Wylene Simmer, MD;  Location: [facility];  Service: Orthopedics;  Laterality: Left;  . PATELLA FRACTURE SURGERY    . PORT-A-CATH INSERTION  2011  . SHOULDER CLOSED REDUCTION  01/18/2012   Procedure: CLOSED MANIPULATION SHOULDER;  Surgeon: Johnn Hai, MD;  Location: [facility];  Service: Orthopedics;  Laterality: Left;  . TONSILECTOMY, ADENOIDECTOMY, BILATERAL MYRINGOTOMY AND TUBES  1985    Social History:  reports that she quit smoking about 11 years ago. Her smoking use included cigarettes. She has a 25.50 pack-year smoking history. She has quit using smokeless tobacco. She reports that she has current or past drug history. Drugs: Amphetamines, Benzodiazepines, Opium, and Hydrocodone. She reports that she does not drink alcohol.  Allergies:  Allergies  Allergen Reactions  . Tape Other (See Comments)    TEARS THE SKIN (SKIN IS THIN!!)  . Latex Rash  . Povidone-Iodine Rash     (Not in a hospital admission)  Blood pressure 126/76, pulse 100, temperature (!) 101 F (38.3 C), temperature source Oral, resp. rate (!) 25, height '5\' 6"'$  (1.676 m), weight 97.1 kg (214 lb), last menstrual period 10/25/2012, SpO2 94 %. Physical Exam: Physical Exam  Constitutional: She is oriented to person, place, and time. She appears well-developed. She is cooperative. She appears ill. No distress. Nasal cannula in place.  HENT:  Head: Normocephalic and atraumatic.  Right Ear: External ear normal.  Left Ear: External ear normal.  Nose: Nose normal.  Mouth/Throat: Mucous membranes are normal.  Eyes: Conjunctivae, EOM and lids are normal. No scleral icterus.  Pupils equal and round  Neck: Normal range of motion and phonation normal. Neck supple.  Cardiovascular: Regular rhythm. Tachycardia present.  Pulses:      Radial pulses are 2+ on the right side, and 2+ on the left side.       Dorsalis pedis pulses are 2+ on the right side, and 2+ on the  left side.  No LE edema bilaterally   Pulmonary/Chest: Effort normal and breath sounds normal. She has no wheezes. She has no rhonchi. She has no rales.  Abdominal: Soft. She exhibits no distension. Bowel sounds are decreased. There is no hepatosplenomegaly. There is tenderness in the right upper quadrant, epigastric area and left upper quadrant. There is positive Murphy's sign. There is no rigidity, no rebound and no guarding. No hernia.  Musculoskeletal:  ROM grossly intact in bilateral upper and lower extremities  Neurological: She is alert and oriented to person, place, and time.  Skin: Skin is warm, dry and intact. No rash noted. She is not diaphoretic.  Psychiatric: She has a normal mood and affect. Her speech is normal and behavior is normal.    Results for orders placed or performed during the hospital encounter of 12/31/17 (from the past 48 hour(s))  Urinalysis, Routine w reflex microscopic     Status: Abnormal   Collection Time: 12/31/17 10:38 AM  Result Value  Ref Range   Color, Urine AMBER (A) YELLOW    Comment: BIOCHEMICALS MAY BE AFFECTED BY COLOR   APPearance CLEAR CLEAR   Specific Gravity, Urine 1.025 1.005 - 1.030   pH 5.0 5.0 - 8.0   Glucose, UA NEGATIVE NEGATIVE mg/dL   Hgb urine dipstick NEGATIVE NEGATIVE   Bilirubin Urine NEGATIVE NEGATIVE   Ketones, ur 5 (A) NEGATIVE mg/dL   Protein, ur 100 (A) NEGATIVE mg/dL   Nitrite NEGATIVE NEGATIVE   Leukocytes, UA NEGATIVE NEGATIVE   RBC / HPF 6-30 0 - 5 RBC/hpf   WBC, UA 0-5 0 - 5 WBC/hpf   Bacteria, UA FEW (A) NONE SEEN   Squamous Epithelial / LPF 0-5 (A) NONE SEEN   Mucus PRESENT    Hyaline Casts, UA PRESENT     Comment: Performed at [facility], [place] [address] [place], [place] [address]  Comprehensive metabolic panel     Status: Abnormal   Collection Time: 12/31/17 10:45 AM  Result Value Ref Range   Sodium 135 135 - 145 mmol/L   Potassium 4.2 3.5 - 5.1 mmol/L   Chloride 101 101 - 111 mmol/L    CO2 25 22 - 32 mmol/L   Glucose, Bld 146 (H) 65 - 99 mg/dL   BUN 21 (H) 6 - 20 mg/dL   Creatinine, Ser 0.67 0.44 - 1.00 mg/dL   Calcium 8.6 (L) 8.9 - 10.3 mg/dL   Total Protein 6.3 (L) 6.5 - 8.1 g/dL   Albumin 3.5 3.5 - 5.0 g/dL   AST 24 15 - 41 U/L   ALT 27 14 - 54 U/L   Alkaline Phosphatase 107 38 - 126 U/L   Total Bilirubin 1.0 0.3 - 1.2 mg/dL   GFR calc non Af Amer >60 >60 mL/min   GFR calc Af Amer >60 >60 mL/min    Comment: (NOTE) The eGFR has been calculated using the CKD EPI equation. This calculation has not been validated in all clinical situations. eGFR's persistently <60 mL/min signify possible Chronic Kidney Disease.    Anion gap 9 5 - 15    Comment: Performed at [facility], [place] [address], [place], [place] [address]  CBC WITH DIFFERENTIAL     Status: Abnormal   Collection Time: 12/31/17 10:45 AM  Result Value Ref Range   WBC 16.1 (H) 4.0 - 10.5 K/uL   RBC 4.22 3.87 - 5.11 MIL/uL   Hemoglobin 13.4 12.0 - 15.0 g/dL   HCT 39.6 36.0 - 46.0 %   MCV 93.8 78.0 - 100.0 fL   MCH 31.8 26.0 - 34.0 pg   MCHC 33.8 30.0 - 36.0 g/dL   RDW 12.8 11.5 - 15.5 %   Platelets 216 150 - 400 K/uL   Neutrophils Relative % 84 %   Neutro Abs 13.5 (H) 1.7 - 7.7 K/uL   Lymphocytes Relative 6 %   Lymphs Abs 1.0 0.7 - 4.0 K/uL   Monocytes Relative 10 %   Monocytes Absolute 1.6 (H) 0.1 - 1.0 K/uL   Eosinophils Relative 0 %   Eosinophils Absolute 0.0 0.0 - 0.7 K/uL   Basophils Relative 0 %   Basophils Absolute 0.0 0.0 - 0.1 K/uL    Comment: Performed at [facility], [place] [address], [place], [place] [address]  Lipase, blood     Status: None   Collection Time: 12/31/17 10:45 AM  Result Value Ref Range   Lipase 25 11 - 51 U/L    Comment: Performed  at [facility], [place] [address], [place], [place] [address]  I-Stat CG4 Lactic Acid, ED  (not at  [facility])     Status: None   Collection Time: 12/31/17 10:48 AM  Result Value Ref Range    Lactic Acid, Venous 1.01 0.5 - 1.9 mmol/L   Dg Chest 2 View  Result Date: 12/31/2017 CLINICAL DATA:  Fever of 103.1.  Abdominal pain. EXAM: CHEST - 2 VIEW COMPARISON:  12/04/2016. FINDINGS: Poor inspiration. Grossly stable normal sized heart. Interval linear and patchy density in the left lower lobe and probable small left pleural effusion. Clear right lung. Multiple old, healed right rib fractures. IMPRESSION: Interval left lower lobe atelectasis and possible pneumonia with a probable small left pleural effusion. Electronically Signed   By: Claudie Revering M.D.   On: 12/31/2017 10:18   Ct Abdomen Pelvis W Contrast  Result Date: 12/31/2017 CLINICAL DATA:  Acute abdominal pain, fever, history of lymphoma now in remission, elevated white count EXAM: CT ABDOMEN AND PELVIS WITH CONTRAST TECHNIQUE: Multidetector CT imaging of the abdomen and pelvis was performed using the standard protocol following bolus administration of intravenous contrast. CONTRAST:  122m ISOVUE-300 IOPAMIDOL (ISOVUE-300) INJECTION 61% COMPARISON:  02/02/2017 FINDINGS: Lower chest: Bandlike bibasilar atelectasis, worse on the left. No significant pleural effusion. Normal heart size. No pericardial effusion. Hepatobiliary: Small left hepatic dome hypodense cysts, unchanged. Mild central biliary prominence. No other focal hepatic abnormality. Hepatic and portal veins remain patent. Gallbladder is distended with mucosal enhancement mild surrounding inflammatory changes, and trace pericholecystic fluid. Appearance compatible with acute cholecystitis. Adjacent inflammatory reactive changes noted along the duodenum, pancreatic head, and adjacent colon. No fluid collection or abscess. Pancreas: No focal abnormality or ductal dilatation. Spleen: Normal in size without focal abnormality. Adrenals/Urinary Tract: Adrenal glands are unremarkable. Kidneys are normal, without renal calculi, focal lesion, or hydronephrosis. Bladder is unremarkable.  Stomach/Bowel: postop changes from gastric bypass. Reactive inflammatory changes around duodenum in the right upper quadrant to cholecystitis. Negative for bowel obstruction, significant dilatation, ileus, free air. Appendix is unremarkable. Vascular/Lymphatic: Aortic atherosclerosis. Negative for aneurysm. Adenopathy. Reproductive: Midline pelvic complex partially cystic mass measuring 7 x 9 cm, image 67 series 2. Consider follow-up evaluation with transvaginal pelvic ultrasound further characterization. 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 Other: No inguinal or abdominal wall hernia. No significant ascites. Musculoskeletal: Lower lumbar degenerative changes noted. No acute compression fracture. IMPRESSION: Distended gallbladder with mucosal enhancement surrounding inflammatory changes, and trace pericholecystic fluid compatible with acute cholecystitis. These results were called by telephone at the time of interpretation on 12/31/2017 at 12:06 pm to Dr. MAletta Edouard, who verbally acknowledged these results. No associated biliary obstruction or dilatation. 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 This recommendation follows ACR consensus guidelines: White Paper of the ACR Incidental Findings Committee II on Adnexal Findings. J Am Coll Radiol [id number] Electronically Signed   By: MJerilynn Mages  Shick M.D.   On: 12/31/2017 12:07      Assessment/Plan Hx of Hodgkin's Lymphoma - finished chemo 2011, followed by Dr. GAlvy BimlerOveractive bladder - home meds ordered Depression/anxiety - home meds  Hx of PE - 2017, on Eliquis; last dose 0500 this AM - internal medicine to consult and assist in anticoagulation Acute cholecystitis - CT: Distended gallbladder  with mucosal enhancement surrounding inflammatory changes, and  trace pericholecystic fluid compatible with acute cholecystitis - RUQ [place] pending - WBC 16, mildly tachycardic, febrile at 101 - Tbili 1.0, AST/ALT and alk phos normal - admit for IV abx, nausea/pain control - will need time for Eliquis to wear off (48-72 h) prior to laparoscopic cholecystectomy - if patient acutely worsens, may need to consider percutaneous drainage as a temporizing measure Complex adnexal mass - noted on CT  FEN: CLD, IVF VTE: SCDs, likely heparin gtt (medicine to order) ID: rocephin/flagyl 3/22; IV zosyn 3/22>>  Brigid Re, [facility] Surgery 12/31/2017, 1:52 PM Pager: [phone] Consults: [phone] Mon-Fri 7:00 am-4:30 pm Sat-Sun 7:00 am-11:30 am

## 2017-12-31 NOTE — ED Provider Notes (Signed)
[facility] DEPT Provider Note   CSN: [id number] Arrival date & time: 12/31/17  2122     History   Chief Complaint No chief complaint on file.   HPI Joyce Payne is a 57 y.o. female.  She has a remote history of Hodgkin's lymphoma is now in remission.  She is not on any active chemotherapy.  She is complaining of feeling sick since Monday with fevers as high as 105.  It is associated with some myalgias and arthralgias and since Wednesday she has had severe diffuse abdominal pain.  She has been nauseous but there is been no vomiting or diarrhea.  No urinary symptoms.  She has her history of some migratory arthralgias and some cognitive decline and she has been under the care of ENT, chiropractory, and neurology.  She has history of gastric bypass remote.  No other abdominal surgeries.  She has been taking Tylenol with some improvement of her fever.  The history is provided by the patient.  Abdominal Pain   This is a new problem. The current episode started more than 2 days ago. The problem occurs constantly. The problem has not changed since onset.The pain is associated with an unknown factor. The pain is located in the generalized abdominal region. The quality of the pain is sharp. The pain is severe. Associated symptoms include fever, nausea, headaches, arthralgias and myalgias. Pertinent negatives include diarrhea, hematochezia, melena, vomiting, constipation, dysuria, frequency and hematuria. Nothing aggravates the symptoms. Nothing relieves the symptoms.    Past Medical History:  Diagnosis Date  . Anxiety   . Arthritis of knee   . 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 Chronic back pain   . Depression   . Depression with anxiety 02/16/2012  . GERD (gastroesophageal reflux disease)   . H/O gastric bypass 02/15/2012   6/88  . History of DVT (deep vein thrombosis) NOV 2011- RIGHT LOWER EXTREMITY  . 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 Hodgkin lymphoma ([place]) STAGE II  VERSUS IVa  ------ ONCOLOGIST- DR Beryle Beams   S/P CHEMOTHERAPY---   IN REMISSION FOR 15 MONTHS  . 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 Hx of pulmonary embolus 02/15/2012   Occurred during chemo for Hodgkin's dis 2011  . Impingement syndrome of left shoulder   . Neuromuscular disorder ([place])   . Peripheral vascular disease ([place])   . Pneumonia   . Recurrent binge eating 02/16/2012   Reason for topomax  . S/P gastric bypass   . Sinus infection   . Sleep apnea   . Spinal headache     Patient Active Problem List   Diagnosis Date Noted  . Lethargy 09/27/2016  . Fatigue 08/10/2016  . Demand ischemia ([place])   . Acute saddle pulmonary embolism without acute cor pulmonale ([place]) 06/24/2016  . Iron deficiency 10/20/2012  . Routine general medical examination at a health care facility 07/26/2012  . Other screening mammogram 07/26/2012  . OAB (overactive bladder) 04/28/2012  . Depression with anxiety 02/16/2012  . History of lymphoma 02/15/2012  . H/O gastric bypass 02/15/2012  . Allergic rhinitis, cause unspecified 01/14/2012  . ANXIETY 11/11/2010  . DEPRESSION 11/11/2010  . Other chronic pain 11/11/2010  . GERD 11/11/2010  . 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 LOW BACK PAIN 11/11/2010    Past Surgical History:  Procedure Laterality Date  .  GASTRIC BYPASS  1999  . IR GENERIC HISTORICAL  06/25/2016   IR US GUIDE VASC ACCESS RIGHT 06/25/2016 Sandi Mariscal, MD [facility]  . IR GENERIC HISTORICAL  06/25/2016   IR ANGIOGRAM SELECTIVE EACH ADDITIONAL VESSEL 06/25/2016 Sandi Mariscal, MD [facility]  . IR GENERIC HISTORICAL  06/25/2016   IR ANGIOGRAM SELECTIVE EACH ADDITIONAL VESSEL 06/25/2016 Sandi Mariscal, MD [facility]  . IR GENERIC HISTORICAL  06/25/2016   IR ANGIOGRAM PULMONARY BILATERAL SELECTIVE 06/25/2016 Sandi Mariscal, MD [facility]  . IR GENERIC HISTORICAL   06/25/2016   IR INFUSION THROMBOL ARTERIAL INITIAL (MS) 06/25/2016 Sandi Mariscal, MD [facility]  . IR GENERIC HISTORICAL  06/25/2016   IR INFUSION THROMBOL ARTERIAL INITIAL (MS) 06/25/2016 Sandi Mariscal, MD [facility]  . IR GENERIC HISTORICAL  06/26/2016   IR THROMB F/U EVAL ART/VEN FINAL DAY (MS) 06/26/2016 Aletta Edouard, MD [facility]  . [place] [facility]  2009  . ORIF TOE FRACTURE Left 06/27/2015   Procedure: OPEN REDUCTION INTERNAL FIXATION (ORIF) LEFT FOURTH AND FIFTH METATARSAL (TOE) FRACTURE NONUNION;  Surgeon: Wylene Simmer, MD;  Location: [facility];  Service: Orthopedics;  Laterality: Left;  . PATELLA FRACTURE SURGERY    . PORT-A-CATH INSERTION  2011  . SHOULDER CLOSED REDUCTION  01/18/2012   Procedure: CLOSED MANIPULATION SHOULDER;  Surgeon: Johnn Hai, MD;  Location: [facility];  Service: Orthopedics;  Laterality: Left;  . TONSILECTOMY, ADENOIDECTOMY, BILATERAL MYRINGOTOMY AND TUBES  1985    OB History   None      Home Medications    Prior to Admission medications   Medication Sig Start Date End Date Taking? Authorizing Provider  apixaban (ELIQUIS) 5 MG TABS tablet Take 1 tablet (5 mg total) by mouth 2 (two) times daily. 09/14/17   Juanito Doom, MD  celecoxib (CELEBREX) 200 MG capsule Take 200 mg by mouth 2 (two) times daily. 09/15/16   [provider]  Cholecalciferol (VITAMIN D3) 2400 UNIT/ML LIQD Take 2,500 Units by mouth daily.    [provider]  diazepam (VALIUM) 5 MG tablet Take 0.5 tablets (2.5 mg total) by mouth every 12 (twelve) hours as needed for anxiety. 09/28/16   Caren Griffins, MD  DULoxetine (CYMBALTA) 60 MG capsule Take 60 mg by mouth daily.    [provider]  DYMISTA 137-50 MCG/ACT SUSP SHAKE LQ AND U 1 SPR IEN BID 12/30/16   [provider]  ferrous gluconate (FERGON) 324 MG tablet Take 1 tablet (324 mg total) by mouth 2 (two) times daily with a meal. 09/28/16   Gherghe, Vella Redhead,  MD  fesoterodine (TOVIAZ) 8 MG TB24 tablet Take 8 mg by mouth daily.    [provider]  levocetirizine (XYZAL) 5 MG tablet TK 1 T PO QD IN THE EVE 12/16/16   [provider]  lisdexamfetamine (VYVANSE) 70 MG capsule Take 70 mg by mouth daily.    [provider]  montelukast (SINGULAIR) 10 MG tablet TK 1 T PO QD IN THE EVE 12/30/16   [provider]  Multiple Vitamin (MULTIVITAMIN) capsule Take 1 capsule by mouth daily.    [provider]  Omeprazole-Sodium Bicarbonate (ZEGERID) 20-1100 MG CAPS capsule Take 1 capsule by mouth daily before breakfast.    [provider]  pregabalin (LYRICA) 150 MG capsule Take 1 capsule (150 mg total) by mouth daily. 09/28/16   Caren Griffins, MD  traZODone (DESYREL) 100 MG tablet  12/14/16   [provider]  Family History Family History  Problem Relation Age of Onset  . Hypertension Mother   . Hyperlipidemia Mother   . Cancer Father        colon can  . Hypertension Other   . Cancer Other        colon, 1st degree relative<60    Social History Social History   Tobacco Use  . Smoking status: Former Smoker    Packs/day: 1.50    Years: 17.00    Pack years: 25.50    Types: Cigarettes    Last attempt to quit: 10/22/2006    Years since quitting: 11.2  . Smokeless tobacco: Former Network engineer Use Topics  . Alcohol use: No  . Drug use: Yes    Types: Amphetamines, Benzodiazepines, Opium, Hydrocodone     Allergies   Tape; Latex; and Povidone-iodine   Review of Systems Review of Systems  Constitutional: Positive for fever. Negative for chills.  HENT: Negative for ear pain and sore throat.   Eyes: Negative for pain and visual disturbance.  Respiratory: Negative for cough and shortness of breath.   Cardiovascular: Negative for chest pain and palpitations.  Gastrointestinal: Positive for abdominal pain and nausea. Negative for constipation, diarrhea, hematochezia, melena and  vomiting.  Genitourinary: Negative for dysuria, frequency and hematuria.  Musculoskeletal: Positive for arthralgias, myalgias and neck pain. Negative for back pain.  Skin: Negative for color change and rash.  Neurological: Positive for headaches. Negative for seizures and syncope.  All other systems reviewed and are negative.    Physical Exam Updated Vital Signs BP 102/66 (BP Location: Left Arm)   Pulse (!) 110   Temp (!) 101 F (38.3 C) (Oral)   Resp 16   Ht 5\' 6"  (1.676 m)   Wt 97.1 kg (214 lb)   LMP 10/25/2012   SpO2 93%   BMI 34.54 kg/m   Physical Exam  Constitutional: She appears well-developed and well-nourished. No distress.  HENT:  Head: Normocephalic and atraumatic.  Mouth/Throat: Oropharynx is clear and moist.  Eyes: Conjunctivae are normal.  Neck: Neck supple.  Cardiovascular: Regular rhythm and intact distal pulses. Tachycardia present.  No murmur heard. Pulmonary/Chest: Effort normal and breath sounds normal. No stridor. No respiratory distress. She has no wheezes.  Abdominal: Soft. There is generalized tenderness (More upper than lower.). There is no rigidity and no guarding.  Musculoskeletal: Normal range of motion. She exhibits no edema or tenderness.  Neurological: She is alert. GCS eye subscore is 4. GCS verbal subscore is 5. GCS motor subscore is 6.  Skin: Skin is warm and dry.  Psychiatric: She has a normal mood and affect.  Nursing note and vitals reviewed.    ED Treatments / Results  Labs (all labs ordered are listed, but only abnormal results are displayed) Labs Reviewed  COMPREHENSIVE METABOLIC PANEL - Abnormal; Notable for the following components:      Result Value   Glucose, Bld 146 (*)    BUN 21 (*)    Calcium 8.6 (*)    Total Protein 6.3 (*)    All other components within normal limits  CBC WITH DIFFERENTIAL/PLATELET - Abnormal; Notable for the following components:   WBC 16.1 (*)    Neutro Abs 13.5 (*)    Monocytes Absolute 1.6 (*)     All other components within normal limits  URINALYSIS, ROUTINE W REFLEX MICROSCOPIC - Abnormal; Notable for the following components:   Color, Urine AMBER (*)    Ketones, ur 5 (*)  Protein, ur 100 (*)    Bacteria, UA FEW (*)    Squamous Epithelial / LPF 0-5 (*)    All other components within normal limits  CULTURE, BLOOD (ROUTINE X 2)  CULTURE, BLOOD (ROUTINE X 2)  LIPASE, BLOOD  HIV ANTIBODY (ROUTINE TESTING)  HEPARIN LEVEL (UNFRACTIONATED)  APTT  COMPREHENSIVE METABOLIC PANEL  CBC  HEPARIN LEVEL (UNFRACTIONATED)  I-STAT CG4 LACTIC ACID, ED    EKG  EKG Interpretation None       EKG Interpretation  Date/Time:  Friday December 31 2017 08:55:58 EDT Ventricular Rate:  113 PR Interval:    QRS Duration: 110 QT Interval:  334 QTC Calculation: 458 R Axis:   -30 Text Interpretation:  Sinus tachycardia Left axis deviation RSR' in V1 or V2, probably normal variant improved t wave inversions from prior 9/17 Confirmed by Aletta Edouard [phone]) on 12/31/2017 9:23:13 AM        Radiology Dg Chest 2 View  Result Date: 12/31/2017 CLINICAL DATA:  Fever of 103.1.  Abdominal pain. EXAM: CHEST - 2 VIEW COMPARISON:  12/04/2016. FINDINGS: Poor inspiration. Grossly stable normal sized heart. Interval linear and patchy density in the left lower lobe and probable small left pleural effusion. Clear right lung. Multiple old, healed right rib fractures. IMPRESSION: Interval left lower lobe atelectasis and possible pneumonia with a probable small left pleural effusion. Electronically Signed   By: Claudie Revering M.D.   On: 12/31/2017 10:18   Ct Abdomen Pelvis W Contrast  Result Date: 12/31/2017 CLINICAL DATA:  Acute abdominal pain, fever, history of lymphoma now in remission, elevated white count EXAM: CT ABDOMEN AND PELVIS WITH CONTRAST TECHNIQUE: Multidetector CT imaging of the abdomen and pelvis was performed using the standard protocol following bolus administration of intravenous contrast.  CONTRAST:  161mL ISOVUE-300 IOPAMIDOL (ISOVUE-300) INJECTION 61% COMPARISON:  02/02/2017 FINDINGS: Lower chest: Bandlike bibasilar atelectasis, worse on the left. No significant pleural effusion. Normal heart size. No pericardial effusion. Hepatobiliary: Small left hepatic dome hypodense cysts, unchanged. Mild central biliary prominence. No other focal hepatic abnormality. Hepatic and portal veins remain patent. Gallbladder is distended with mucosal enhancement mild surrounding inflammatory changes, and trace pericholecystic fluid. Appearance compatible with acute cholecystitis. Adjacent inflammatory reactive changes noted along the duodenum, pancreatic head, and adjacent colon. No fluid collection or abscess. Pancreas: No focal abnormality or ductal dilatation. Spleen: Normal in size without focal abnormality. Adrenals/Urinary Tract: Adrenal glands are unremarkable. Kidneys are normal, without renal calculi, focal lesion, or hydronephrosis. Bladder is unremarkable. Stomach/Bowel: postop changes from gastric bypass. Reactive inflammatory changes around duodenum in the right upper quadrant to cholecystitis. Negative for bowel obstruction, significant dilatation, ileus, free air. Appendix is unremarkable. Vascular/Lymphatic: Aortic atherosclerosis. Negative for aneurysm. Adenopathy. Reproductive: Midline pelvic complex partially cystic mass measuring 7 x 9 cm, image 67 series 2. Consider follow-up evaluation with transvaginal pelvic ultrasound further characterization. 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 Other: No inguinal or abdominal wall hernia. No significant ascites. Musculoskeletal: Lower lumbar degenerative changes noted. No acute compression fracture. IMPRESSION: Distended gallbladder with mucosal enhancement surrounding inflammatory changes, and trace pericholecystic fluid compatible with acute cholecystitis. These results were called by telephone at the time of interpretation on  12/31/2017 at 12:06 pm to Dr. Aletta Edouard , who verbally acknowledged these results. No associated biliary obstruction or dilatation. 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 This recommendation follows ACR consensus guidelines: White Paper of the ACR Incidental Findings Committee II on Adnexal Findings. J Am Coll Radiol [phone]. Electronically Signed   By: Jerilynn Mages.  Shick M.D.   On: 12/31/2017 12:07   US Abdomen Limited  Result Date: 12/31/2017 CLINICAL DATA:  Cholecystitis by CT EXAM: ULTRASOUND ABDOMEN LIMITED RIGHT UPPER QUADRANT COMPARISON:  12/31/2017 FINDINGS: Gallbladder: Moderate gallbladder distention with mild wall thickening measuring 3.3 mm. Trace pericholecystic fluid. Murphys sign is elicited over the gallbladder during the exam. Gallbladder sludge noted. Findings compatible with cholecystitis. Common bile duct: Diameter: 6 mm. Liver: No focal abnormality. Normal echogenicity. Mild diffuse intrahepatic biliary dilatation. Portal vein is patent on color Doppler imaging with normal direction of blood flow towards the liver. IMPRESSION: Distended gallbladder with mild wall thickening, ultrasound findings suggesting acute cholecystitis as above. Mild intrahepatic biliary dilatation Electronically Signed   By: Jerilynn Mages.  Shick M.D.   On: 12/31/2017 14:17    Procedures Procedures (including critical care time)  Medications Ordered in ED Medications  iopamidol (ISOVUE-300) 61 % injection (has no administration in time range)  sodium chloride 0.9 % injection (has no administration in time range)  piperacillin-tazobactam (ZOSYN) IVPB 3.375 g (0 g Intravenous Stopped 12/31/17 1849)  dextrose 5 % and 0.45 % NaCl with KCl 20 mEq/L infusion ( Intravenous Stopped 12/31/17  1903)  acetaminophen (TYLENOL) tablet 650 mg (650 mg Oral Given 12/31/17 1911)    Or  acetaminophen (TYLENOL) suppository 650 mg ( Rectal See Alternative 12/31/17 1911)  ketorolac (TORADOL) 30 MG/ML injection 30 mg (has no administration in time range)  oxyCODONE (Oxy IR/ROXICODONE) immediate release tablet 5-10 mg (has no administration in time range)  HYDROmorphone (DILAUDID) injection 1-2 mg (2 mg Intravenous Given 12/31/17 1641)  senna (SENOKOT) tablet 8.6 mg (8.6 mg Oral Given 12/31/17 1639)  polyethylene glycol (MIRALAX / GLYCOLAX) packet 17 g (has no administration in time range)  ondansetron (ZOFRAN-ODT) disintegrating tablet 4 mg (has no administration in time range)    Or  ondansetron (ZOFRAN) injection 4 mg (has no administration in time range)  pantoprazole (PROTONIX) injection 40 mg (has no administration in time range)  metoprolol tartrate (LOPRESSOR) injection 5 mg (has no administration in time range)  diazepam (VALIUM) tablet 2.5 mg (has no administration in time range)  DULoxetine (CYMBALTA) DR capsule 90 mg (has no administration in time range)  fesoterodine (TOVIAZ) tablet 8 mg (has no administration in time range)  lisdexamfetamine (VYVANSE) capsule 50 mg (has no administration in time range)  pregabalin (LYRICA) capsule 50 mg (has no administration in time range)  traZODone (DESYREL) tablet 125 mg (has no administration in time range)  traMADol (ULTRAM) tablet 100 mg (has no administration in time range)  multivitamin with minerals tablet 1 tablet (1 tablet Oral Given 12/31/17 1640)  heparin ADULT infusion 100 units/mL (25000 units/229mL sodium chloride 0.45%) (0 Units/hr Intravenous Stopped 12/31/17 1903)  chlorhexidine (PERIDEX) 0.12 % solution 15 mL (has no administration in time range)  MEDLINE mouth rinse (15 mLs Mouth Rinse Given 12/31/17 1705)  sodium chloride 0.9 % bolus 1,000 mL (0 mLs Intravenous Stopped 12/31/17 1227)  acetaminophen (TYLENOL) tablet 650 mg (650 mg  Oral Given 12/31/17 1013)  iopamidol (ISOVUE-300) 61 % injection 100 mL (100 mLs Intravenous Contrast Given 12/31/17 1123)  cefTRIAXone (ROCEPHIN) 1 g in sodium chloride 0.9 % 100 mL IVPB (0 g Intravenous Stopped 12/31/17 1345)  sodium chloride 0.9 % bolus 1,000 mL (0 mLs Intravenous Stopped 12/31/17  1413)  morphine 4 MG/ML injection 4 mg (4 mg Intravenous Given 12/31/17 1300)     Initial Impression / Assessment and Plan / ED Course  I have reviewed the triage vital signs and the nursing notes.  Pertinent labs & imaging results that were available during my care of the patient were reviewed by me and considered in my medical decision making (see chart for details).  Clinical Course as of Dec 31 1913  Fri Dec 31, 2017  1158 Got called from radiology who states that she looks like she might have acute cholecystitis.  We will start her on some antibiotics and order her a right upper quadrant ultrasound.   [MB]  1220 Discussed with general surgery on-call and the PA for the team will come down and evaluate the patient in the ED.   [MB]    Clinical Course User Index [MB] Hayden Rasmussen, MD    To be admitted by surgery.   Final Clinical Impressions(s) / ED Diagnoses   Final diagnoses:  Cholecystitis, acute    ED Discharge Orders    None       Hayden Rasmussen, MD 12/31/17 [phone]

## 2017-12-31 NOTE — ED Notes (Signed)
Bed: ZV47 Expected date:  Expected time:  Means of arrival:  Comments: Fever/ Abd pain

## 2017-12-31 NOTE — ED Triage Notes (Signed)
Per EMS, pt is coming from home with complaints abdominal x4 days with a fever of 103.1 and has been consistent. Pt reports pain 6/10. Pt reports no hx of GI issues. No n/v/d. No cough or flu symptoms. EMS attempted to administer 100 mcg of fentanyl but the peripheral iv infiltrated. Pt has a hx of hodgekins in remission since 2011.

## 2018-01-01 LAB — COMPREHENSIVE METABOLIC PANEL
ALBUMIN: 2.5 g/dL — AB (ref 3.5–5.0)
ALK PHOS: 93 U/L (ref 38–126)
ALK PHOS: 111 U/L (ref 38–126)
ALT: 24 U/L (ref 14–54)
ALT: 23 U/L (ref 14–54)
AST: 23 U/L (ref 15–41)
AST: 26 U/L (ref 15–41)
Albumin: 1 g/dL — ABNORMAL LOW (ref 3.5–5.0)
Anion gap: 8 (ref 5–15)
Anion gap: 9 (ref 5–15)
BILIRUBIN TOTAL: 0.8 mg/dL (ref 0.3–1.2)
BUN: 19 mg/dL (ref 6–20)
BUN: 19 mg/dL (ref 6–20)
CALCIUM: 8.2 mg/dL — AB (ref 8.9–10.3)
CO2: 25 mmol/L (ref 22–32)
CO2: 24 mmol/L (ref 22–32)
CREATININE: 0.68 mg/dL (ref 0.44–1.00)
CREATININE: 0.63 mg/dL (ref 0.44–1.00)
Calcium: 8.1 mg/dL — ABNORMAL LOW (ref 8.9–10.3)
Chloride: 106 mmol/L (ref 101–111)
Chloride: 103 mmol/L (ref 101–111)
GFR calc Af Amer: >60 mL/min (ref >60)
GFR calc non Af Amer: >60 mL/min (ref >60)
GFR calc non Af Amer: >60 mL/min (ref >60)
GLUCOSE: 124 mg/dL — AB (ref 65–99)
Glucose, Bld: 169 mg/dL — ABNORMAL HIGH (ref 65–99)
POTASSIUM: 4.1 mmol/L (ref 3.5–5.1)
Potassium: 3.6 mmol/L (ref 3.5–5.1)
SODIUM: 136 mmol/L (ref 135–145)
Sodium: 139 mmol/L (ref 135–145)
TOTAL PROTEIN: 5.4 g/dL — AB (ref 6.5–8.1)
TOTAL PROTEIN: 5.3 g/dL — AB (ref 6.5–8.1)
Total Bilirubin: 1 mg/dL (ref 0.3–1.2)

## 2018-01-01 LAB — HEPARIN LEVEL (UNFRACTIONATED): Heparin Unfractionated: 1.8 IU/mL — ABNORMAL HIGH (ref 0.30–0.70)

## 2018-01-01 LAB — CBC
HCT: 35.1 % — ABNORMAL LOW (ref 36.0–46.0)
Hemoglobin: 11.5 g/dL — ABNORMAL LOW (ref 12.0–15.0)
MCH: 30.9 pg (ref 26.0–34.0)
MCHC: 32.8 g/dL (ref 30.0–36.0)
MCV: 94.4 fL (ref 78.0–100.0)
PLATELETS: 157 10*3/uL (ref 150–400)
RBC: 3.72 MIL/uL — ABNORMAL LOW (ref 3.87–5.11)
RDW: 12.9 % (ref 11.5–15.5)
WBC: 14.2 10*3/uL — ABNORMAL HIGH (ref 4.0–10.5)

## 2018-01-01 LAB — APTT
aPTT: 83 seconds — ABNORMAL HIGH (ref 24–36)
aPTT: 82 seconds — ABNORMAL HIGH (ref 24–36)

## 2018-01-01 LAB — HIV ANTIBODY (ROUTINE TESTING W REFLEX): HIV Screen 4th Generation wRfx: NONREACTIVE

## 2018-01-01 MED ORDER — OXYCODONE HCL 5 MG PO TABS
5.0000 mg | ORAL_TABLET | ORAL | Status: DC | PRN
  Administered 2018-01-01: 5 mg via ORAL
  Administered 2018-01-01 – 2018-01-04 (×5): 10 mg via ORAL
  Filled 2018-01-01 (×5): qty 2
  Filled 2018-01-01: qty 1

## 2018-01-01 NOTE — Progress Notes (Addendum)
Patient ID: Joyce Payne, female   DOB: Sep 29, 1961, 57 y.o.   MRN: [id number]     Subjective: Feels "horrible".  Multiple complaints this morning including fever, dry mouth, episode of urinary incontinence, as well as abdominal pain.  No vomiting.  Says she feels confused at times.  Objective: Vital signs in last 24 hours: Temp:  [97.8 F (36.6 C)-100.4 F (38 C)] 98.4 F (36.9 C) (03/23 0504) Pulse Rate:  [88-118] 88 (03/23 0504) Resp:  [16-25] 16 (03/23 0504) BP: (95-155)/(52-76) 138/76 (03/23 0504) SpO2:  [94 %-100 %] 95 % (03/23 0504) Last BM Date: 12/30/17  Intake/Output from previous day: 03/22 0701 - 03/23 0700 In: 1629.3 [P.O.:480; I.V.:999.3; IV Piggyback:150] Out: 700 [Urine:700] Intake/Output this shift: No intake/output data recorded.  General appearance: cooperative, fatigued, moderately obese and slowed mentation Resp: clear to auscultation bilaterally GI: Localized right upper quadrant tenderness.  Generally nontender.  Nondistended.  Well-healed upper midline incision.  Lab Results:  Recent Labs    12/31/17 1045 01/01/18 0300  WBC 16.1* 14.2*  HGB 13.4 11.5*  HCT 39.6 35.1*  PLT 216 157   BMET Recent Labs    12/31/17 1045 01/01/18 0300  NA 135 139  K 4.2 4.1  CL 101 106  CO2 25 25  GLUCOSE 146* 169*  BUN 21* 19  CREATININE 0.67 0.68  CALCIUM 8.6* 8.2*     Studies/Results: Dg Chest 2 View  Result Date: 12/31/2017 CLINICAL DATA:  Fever of 103.1.  Abdominal pain. EXAM: CHEST - 2 VIEW COMPARISON:  12/04/2016. FINDINGS: Poor inspiration. Grossly stable normal sized heart. Interval linear and patchy density in the left lower lobe and probable small left pleural effusion. Clear right lung. Multiple old, healed right rib fractures. IMPRESSION: Interval left lower lobe atelectasis and possible pneumonia with a probable small left pleural effusion. Electronically Signed   By: Claudie Revering M.D.   On: 12/31/2017 10:18   Ct Abdomen Pelvis W  Contrast  Result Date: 12/31/2017 CLINICAL DATA:  Acute abdominal pain, fever, history of lymphoma now in remission, elevated white count EXAM: CT ABDOMEN AND PELVIS WITH CONTRAST TECHNIQUE: Multidetector CT imaging of the abdomen and pelvis was performed using the standard protocol following bolus administration of intravenous contrast. CONTRAST:  166mL ISOVUE-300 IOPAMIDOL (ISOVUE-300) INJECTION 61% COMPARISON:  02/02/2017 FINDINGS: Lower chest: Bandlike bibasilar atelectasis, worse on the left. No significant pleural effusion. Normal heart size. No pericardial effusion. Hepatobiliary: Small left hepatic dome hypodense cysts, unchanged. Mild central biliary prominence. No other focal hepatic abnormality. Hepatic and portal veins remain patent. Gallbladder is distended with mucosal enhancement mild surrounding inflammatory changes, and trace pericholecystic fluid. Appearance compatible with acute cholecystitis. Adjacent inflammatory reactive changes noted along the duodenum, pancreatic head, and adjacent colon. No fluid collection or abscess. Pancreas: No focal abnormality or ductal dilatation. Spleen: Normal in size without focal abnormality. Adrenals/Urinary Tract: Adrenal glands are unremarkable. Kidneys are normal, without renal calculi, focal lesion, or hydronephrosis. Bladder is unremarkable. Stomach/Bowel: postop changes from gastric bypass. Reactive inflammatory changes around duodenum in the right upper quadrant to cholecystitis. Negative for bowel obstruction, significant dilatation, ileus, free air. Appendix is unremarkable. Vascular/Lymphatic: Aortic atherosclerosis. Negative for aneurysm. Adenopathy. Reproductive: Midline pelvic complex partially cystic mass measuring 7 x 9 cm, image 67 series 2. Consider follow-up evaluation with transvaginal pelvic ultrasound further characterization. 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 Other: No inguinal or abdominal wall hernia. No  significant ascites. Musculoskeletal: Lower lumbar degenerative changes noted. No acute compression fracture. IMPRESSION:  Distended gallbladder with mucosal enhancement surrounding inflammatory changes, and trace pericholecystic fluid compatible with acute cholecystitis. These results were called by telephone at the time of interpretation on 12/31/2017 at 12:06 pm to Dr. Aletta Edouard , who verbally acknowledged these results. No associated biliary obstruction or dilatation. 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 This recommendation follows ACR consensus guidelines: White Paper of the ACR Incidental Findings Committee II on Adnexal Findings. J Am Coll Radiol [phone]. Electronically Signed   By: Jerilynn Mages.  Shick M.D.   On: 12/31/2017 12:07   US Abdomen Limited  Result Date: 12/31/2017 CLINICAL DATA:  Cholecystitis by CT EXAM: ULTRASOUND ABDOMEN LIMITED RIGHT UPPER QUADRANT COMPARISON:  12/31/2017 FINDINGS: Gallbladder: Moderate gallbladder distention with mild wall thickening measuring 3.3 mm. Trace pericholecystic fluid. Murphys sign is elicited over the gallbladder during the exam. Gallbladder sludge noted. Findings compatible with cholecystitis. Common bile duct: Diameter: 6 mm. Liver: No focal abnormality. Normal echogenicity. Mild diffuse intrahepatic biliary dilatation. Portal vein is patent on color Doppler imaging with normal direction of blood flow towards the liver. IMPRESSION: Distended gallbladder with mild wall thickening, ultrasound findings suggesting acute cholecystitis as above. Mild intrahepatic biliary dilatation Electronically Signed   By: Jerilynn Mages.  Shick M.D.   On: 12/31/2017 14:17    Anti-infectives: Anti-infectives (From admission, onward)   Start      Dose/Rate Route Frequency Ordered Stop   12/31/17 1400  piperacillin-tazobactam (ZOSYN) IVPB 3.375 g     3.375 g 12.5 mL/hr over 240 Minutes Intravenous Every 8 hours 12/31/17 1335     12/31/17 1215  cefTRIAXone (ROCEPHIN) 1 g in sodium chloride 0.9 % 100 mL IVPB     1 g 200 mL/hr over 30 Minutes Intravenous  Once 12/31/17 1200 12/31/17 1345   12/31/17 1200  metroNIDAZOLE (FLAGYL) IVPB 500 mg  Status:  Discontinued     500 mg 100 mL/hr over 60 Minutes Intravenous  Once 12/31/17 1200 12/31/17 1343      Assessment/Plan: Acute cholecystitis.  History of open gastric bypass.  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  Eliquis stopped for past 24 hours.  On bridging heparin. Appears moderately ill this morning.  Vital signs are stable and white blood count decreased.  She will require cholecystectomy and she is in agreement with this.  Would prefer to hold Eliquis for 72 hours but 48 hours may be adequate depending on her clinical course tomorrow.  Continue antibiotics and support.    LOS: 1 day    Edward Jolly 01/01/2018

## 2018-01-01 NOTE — Progress Notes (Signed)
[place] for IV heparin Indication: Hx VTE on Eliquis; perioperative bridging  Allergies  Allergen Reactions  . Tape Other (See Comments)    TEARS THE SKIN (SKIN IS THIN!!)  . Latex Rash  . Povidone-Iodine Rash    Patient Measurements: Height: 5\' 6"  (167.6 cm) Weight: 214 lb (97.1 kg) IBW/kg (Calculated) : 59.3 Heparin Dosing Weight: 81  Vital Signs: Temp: 97.8 F (36.6 C) (03/22 2138) Temp Source: Oral (03/22 2138) BP: 95/52 (03/22 2138) Pulse Rate: 92 (03/22 2138)  Labs: Recent Labs    12/31/17 1045 01/01/18 0300  HGB 13.4 11.5*  HCT 39.6 35.1*  PLT 216 157  HEPARINUNFRC  --  1.80*  CREATININE 0.67  --     Estimated Creatinine Clearance: 92.2 mL/min (by C-G formula based on SCr of 0.67 mg/dL).   Medical History: Past Medical History:  Diagnosis Date  . Anxiety   . Arthritis of knee   . Arthritis of lumbar spine   . Chronic back pain   . Depression   . Depression with anxiety 02/16/2012  . GERD (gastroesophageal reflux disease)   . H/O gastric bypass 02/15/2012   6/88  . History of DVT (deep vein thrombosis) NOV 2011- RIGHT LOWER EXTREMITY  . 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 Hx of pulmonary embolus 02/15/2012   Occurred during chemo for Hodgkin's dis 2011  . Impingement syndrome of left shoulder   . Neuromuscular disorder ([place])   . Peripheral vascular disease ([place])   . Pneumonia   . Recurrent binge eating 02/16/2012   Reason for topomax  . S/P gastric bypass   . Sinus infection   . Sleep apnea   . Spinal headache     Medications:  Medications Prior to Admission  Medication Sig Dispense Refill Last Dose  .  acetaminophen (TYLENOL) 500 MG tablet Take 1,000 mg by mouth every 6 (six) hours as needed (pain).   12/31/2017 at Unknown time  . apixaban (ELIQUIS) 5 MG TABS tablet Take 1 tablet (5 mg total) by mouth 2 (two) times daily. 60 tablet 1 12/31/2017 at 0500  . Ascorbic Acid (VITAMIN C PO) Take 1 tablet by mouth daily.   12/30/2017 at Unknown time  . azelastine (OPTIVAR) 0.05 % ophthalmic solution Place 1 drop into both eyes daily as needed.     . celecoxib (CELEBREX) 100 MG capsule Take 100 mg by mouth 2 (two) times daily.   9 12/31/2017 at Unknown time  . Cholecalciferol (VITAMIN D PO) Take 1 tablet by mouth daily.   12/30/2017 at Unknown time  . diazepam (VALIUM) 5 MG tablet Take 0.5 tablets (2.5 mg total) by mouth every 12 (twelve) hours as needed for anxiety. (Patient taking differently: Take 2.5-7.5 mg by mouth every 6 (six) hours as needed for anxiety. ) 30 tablet 0 12/31/2017 at Unknown time  . DULoxetine (CYMBALTA) 30 MG capsule Take 90 mg by mouth daily.    12/31/2017 at Unknown time  . DYMISTA 137-50 MCG/ACT SUSP SHAKE LQ AND U 1 SPR IEN BID PRN Allergies  3 Past Week at Unknown time  . fesoterodine (TOVIAZ) 8 MG TB24 tablet Take 8  mg by mouth at bedtime.    12/30/2017 at Unknown time  . lisdexamfetamine (VYVANSE) 50 MG capsule Take 50 mg by mouth daily.    12/31/2017 at Unknown time  . Multiple Vitamin (MULTIVITAMIN) capsule Take 1 capsule by mouth daily.   12/30/2017 at Unknown time  . Omeprazole-Sodium Bicarbonate (ZEGERID) 20-1100 MG CAPS capsule Take 1 capsule by mouth daily as needed (acid reflux).    Past Week at Unknown time  . pregabalin (LYRICA) 50 MG capsule Take 50 mg by mouth 2 (two) times daily.   12/31/2017 at Unknown time  . traMADol (ULTRAM) 50 MG tablet Take 100 mg by mouth every 6 (six) hours as needed for pain.  1 12/31/2017 at Unknown time  . traZODone (DESYREL) 100 MG tablet Take 125 mg by mouth at bedtime as needed for sleep.   1 Past Week at Unknown time  . TURMERIC CURCUMIN PO  Take 1 tablet by mouth daily.   12/30/2017 at Unknown time   Scheduled:  . chlorhexidine  15 mL Mouth Rinse BID  . DULoxetine  90 mg Oral Daily  . fesoterodine  8 mg Oral QHS  . lisdexamfetamine  50 mg Oral Daily  . mouth rinse  15 mL Mouth Rinse q12n4p  . multivitamin with minerals  1 tablet Oral Daily  . pantoprazole (PROTONIX) IV  40 mg Intravenous QHS  . pregabalin  50 mg Oral BID  . senna  1 tablet Oral BID   Infusions:  . dextrose 5 % and 0.45 % NaCl with KCl 20 mEq/L 75 mL/hr at 12/31/17 2102  . heparin 1,400 Units/hr (12/31/17 2102)  . 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 Pharmacy consulted to dose heparin while off Eliquis perioperatively. Tentative plan is for Lap choley 48 hrs after last Eliquis if conservative management fails.   Baseline INR, aPTT: not done  Prior anticoagulation: Eliquis 5 mg bid, LD 3/22 at 0500  Significant events:  3/22  CBC: WNL  No bleeding or infusion issues per nursing  CrCl: 92 ml/min Today, 3/23  0300 aptt = 83 sec at goal HL=  1.80 still affected by eliquis  Plts= 216>157, H/H=11.5/35.1  Goal of Therapy: Heparin level 0.3-0.7 units/ml Monitor platelets by anticoagulation protocol: Yes Aptt=66-102 sec  Plan:  Continue heparin drip at 1400 units/hr  Recheck aptt in 6 hour  Using aptt until eliquis affects on HL diminish  Daily CBC, daily heparin level once stable  Monitor for signs of bleeding or thrombosis    Dorrene German 01/01/2018, 3:56 AM

## 2018-01-01 NOTE — Progress Notes (Signed)
Patient Demographics:    Joyce Payne, is a 57 y.o. female, DOB - 09-08-1961, SWF:[id number]  Admit date - 12/31/2017   Admitting Physician Md Edison Pace, MD  Outpatient Primary MD for the patient is Lawerance Cruel, MD  LOS - 1  Chief Complaint  Patient presents with  . Abdominal Pain        Subjective:    Joyce Payne today has  No chest pain, fever patient's, chills persist, abdominal pain persist, nausea Persist,  Assessment  & Plan :    Active Problems:   Acute cholecystitis   1)Acute cholecystitis-she remains very symptomatic, fever, leukocytosis, abdominal pain, nausea and malaise persist, up to surgical team to determine timing of cholecystectomy, patient currently on Zosyn, from a preop standpoint large dose of Eliquis was in the a.m. of 12/30/2017  2)H/o Bil pulmonary embolism/H/o DVT-  from a preop standpoint large dose of Eliquis was in the a.m. of 12/30/2017, continue IV heparin to bridge patient for possible cholecystectomy.  The plan previously was for patient to be on lifelong anticoagulation, use SCDs in the meantime  3)Hodgkin's lymphoma in remission- -She is status post chemotherapy.  Currently in remission.  4)Obesity-prior gastric bypass surgery over 20 years ago  Code Status : Full   Disposition Plan  : TBD  Consults  : General surgery is primary team, hospitalist service is consulted for medical management of anticoagulation in a patient with history of DVT and PE   DVT Prophylaxis  : IV heparin/SCDs   Lab Results  Component Value Date   PLT 157 01/01/2018    Inpatient Medications  Scheduled Meds: . chlorhexidine  15 mL Mouth Rinse BID  . DULoxetine  90 mg Oral Daily  . fesoterodine  8 mg Oral QHS  . lisdexamfetamine  50 mg Oral Daily  . mouth rinse  15 mL Mouth Rinse q12n4p  . multivitamin with minerals  1 tablet Oral Daily  . pantoprazole (PROTONIX) IV  40 mg  Intravenous QHS  . pregabalin  50 mg Oral BID  . senna  1 tablet Oral BID   Continuous Infusions: . dextrose 5 % and 0.45 % NaCl with KCl 20 mEq/L 75 mL/hr at 01/01/18 1400  . heparin 1,400 Units/hr (01/01/18 1117)  . piperacillin-tazobactam (ZOSYN)  IV Stopped (01/01/18 1737)   PRN Meds:.acetaminophen **OR** acetaminophen, diazepam, HYDROmorphone (DILAUDID) injection, ketorolac, metoprolol tartrate, ondansetron **OR** ondansetron (ZOFRAN) IV, oxyCODONE, polyethylene glycol, traMADol, traZODone    Anti-infectives (From admission, onward)   Start     Dose/Rate Route Frequency Ordered Stop   12/31/17 1400  piperacillin-tazobactam (ZOSYN) IVPB 3.375 g     3.375 g 12.5 mL/hr over 240 Minutes Intravenous Every 8 hours 12/31/17 1335     12/31/17 1215  cefTRIAXone (ROCEPHIN) 1 g in sodium chloride 0.9 % 100 mL IVPB     1 g 200 mL/hr over 30 Minutes Intravenous  Once 12/31/17 1200 12/31/17 1345   12/31/17 1200  metroNIDAZOLE (FLAGYL) IVPB 500 mg  Status:  Discontinued     500 mg 100 mL/hr over 60 Minutes Intravenous  Once 12/31/17 1200 12/31/17 1343        Objective:   Vitals:   01/01/18 1100 01/01/18 1155 01/01/18 1400 01/01/18 1600  BP:  Marland Kitchen)  97/57 (!) 97/56 (!) 112/56  Pulse:  94 88 89  Resp:  16 16 16   Temp: 97.6 F (36.4 C) 98.3 F (36.8 C) 98 F (36.7 C) 97.7 F (36.5 C)  TempSrc:  Oral Oral Oral  SpO2:  95% 95% 100%  Weight:      Height:        Wt Readings from Last 3 Encounters:  12/31/17 97.1 kg (214 lb)  01/19/17 94.9 kg (209 lb 3.2 oz)  01/12/17 96 kg (211 lb 11.2 oz)     Intake/Output Summary (Last 24 hours) at 01/01/2018 1847 Last data filed at 01/01/2018 1800 Gross per 24 hour  Intake 2551.07 ml  Output 1300 ml  Net 1251.07 ml     Physical Exam  Gen:- Awake Alert, appears uncomfortable HEENT:- [place].AT, No sclera icterus Neck-Supple Neck,No JVD,.  Lungs-  CTAB , diminished in bases CV- S1, S2 normal Abd-  +ve B.Sounds, Abd Soft, epigastric and right  upper quadrant tenderness,    Extremity/Skin:- No  edema,    Psych-affect is appropriate, oriented x3 Neuro-no new focal deficits, no tremors   Data Review:   Micro Results Recent Results (from the past 240 hour(s))  Blood Culture (routine x 2)     Status: None (Preliminary result)   Collection Time: 12/31/17 10:45 AM  Result Value Ref Range Status   Specimen Description   Final    BLOOD LEFT ANTECUBITAL Performed at [facility] [address] [place], [place] [address]    Special Requests   Final    BOTTLES DRAWN AEROBIC AND ANAEROBIC Blood Culture adequate volume Performed at [facility] [address], [place], [place] [address]    Culture   Final    NO GROWTH 1 DAY Performed at [facility], [place] [address], [place], [place] [address]    Report Status PENDING  Incomplete  Blood Culture (routine x 2)     Status: None (Preliminary result)   Collection Time: 12/31/17 10:54 AM  Result Value Ref Range Status   Specimen Description   Final    BLOOD BLOOD RIGHT FOREARM Performed at [facility] [address], [place], [place] [address]    Special Requests   Final    BOTTLES DRAWN AEROBIC AND ANAEROBIC Blood Culture adequate volume Performed at [facility] [address] [place], [place] [address]    Culture   Final    NO GROWTH < 24 HOURS Performed at [facility] [address], [place], [place] [address]    Report Status PENDING  Incomplete    Radiology Reports Dg Chest 2 View  Result Date: 12/31/2017 CLINICAL DATA:  Fever of 103.1.  Abdominal pain. EXAM: CHEST - 2 VIEW COMPARISON:  12/04/2016. FINDINGS: Poor inspiration. Grossly stable normal sized heart. Interval linear and patchy density in the left lower lobe and probable small left pleural effusion. Clear right lung. Multiple old, healed right rib fractures. IMPRESSION: Interval left lower lobe atelectasis and possible  pneumonia with a probable small left pleural effusion. Electronically Signed   By: Claudie Revering M.D.   On: 12/31/2017 10:18   Ct Abdomen Pelvis W Contrast  Result Date: 12/31/2017 CLINICAL DATA:  Acute abdominal pain, fever, history of lymphoma now in remission, elevated white count EXAM: CT ABDOMEN AND PELVIS WITH CONTRAST TECHNIQUE: Multidetector CT imaging of the abdomen and pelvis was performed using the standard protocol following bolus administration of intravenous contrast. CONTRAST:  128mL ISOVUE-300 IOPAMIDOL (ISOVUE-300) INJECTION 61% COMPARISON:  02/02/2017 FINDINGS: Lower chest: Bandlike bibasilar atelectasis, worse on the left. No significant pleural effusion. Normal heart size. No pericardial effusion. Hepatobiliary: Small left hepatic dome hypodense cysts, unchanged. Mild central biliary prominence. No other focal hepatic abnormality. Hepatic and portal veins remain patent. Gallbladder is distended with mucosal enhancement mild surrounding inflammatory changes, and trace pericholecystic fluid. Appearance compatible with acute cholecystitis. Adjacent inflammatory reactive changes noted along the duodenum, pancreatic head, and adjacent colon. No fluid collection or abscess. Pancreas: No focal abnormality or ductal dilatation. Spleen: Normal in size without focal abnormality. Adrenals/Urinary Tract: Adrenal glands are unremarkable. Kidneys are normal, without renal calculi, focal lesion, or hydronephrosis. Bladder is unremarkable. Stomach/Bowel: postop changes from gastric bypass. Reactive inflammatory changes around duodenum in the right upper quadrant to cholecystitis. Negative for bowel obstruction, significant dilatation, ileus, free air. Appendix is unremarkable. Vascular/Lymphatic: Aortic atherosclerosis. Negative for aneurysm. Adenopathy. Reproductive: Midline pelvic complex partially cystic mass measuring 7 x 9 cm, image 67 series 2. Consider follow-up evaluation with transvaginal pelvic  ultrasound further characterization. 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 Other: No inguinal or abdominal wall hernia. No significant ascites. Musculoskeletal: Lower lumbar degenerative changes noted. No acute compression fracture. IMPRESSION: Distended gallbladder with mucosal enhancement surrounding inflammatory changes, and trace pericholecystic fluid compatible with acute cholecystitis. These results were called by telephone at the time of interpretation on 12/31/2017 at 12:06 pm to Dr. Aletta Edouard , who verbally acknowledged these results. No associated biliary obstruction or dilatation. 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 This recommendation follows ACR consensus guidelines: White Paper of the ACR Incidental Findings Committee II on Adnexal Findings. J Am Coll Radiol [phone]. Electronically Signed   By: Jerilynn Mages.  Shick M.D.   On: 12/31/2017 12:07   US Abdomen Limited  Result Date: 12/31/2017 CLINICAL DATA:  Cholecystitis by CT EXAM: ULTRASOUND ABDOMEN LIMITED RIGHT UPPER QUADRANT COMPARISON:  12/31/2017 FINDINGS: Gallbladder: Moderate gallbladder distention with mild wall thickening measuring 3.3 mm. Trace pericholecystic fluid. Murphys sign is elicited over the gallbladder during the exam. Gallbladder sludge noted. Findings compatible with cholecystitis. Common bile duct: Diameter: 6 mm. Liver: No focal abnormality. Normal echogenicity. Mild diffuse intrahepatic biliary dilatation. Portal vein is patent on color Doppler imaging with normal direction of blood flow towards the liver. IMPRESSION: Distended gallbladder with mild wall thickening, ultrasound findings suggesting acute cholecystitis as above. Mild intrahepatic biliary dilatation  Electronically Signed   By: Jerilynn Mages.  Shick M.D.   On: 12/31/2017 14:17     CBC Recent Labs  Lab 12/31/17 1045 01/01/18 0300  WBC 16.1* 14.2*  HGB 13.4 11.5*  HCT 39.6 35.1*  PLT 216 157  MCV 93.8 94.4  MCH 31.8 30.9  MCHC 33.8 32.8  RDW 12.8 12.9  LYMPHSABS 1.0  --   MONOABS 1.6*  --   EOSABS 0.0  --   BASOSABS 0.0  --     Chemistries  Recent Labs  Lab 12/31/17 1045 01/01/18 0300 01/01/18 0958  NA 135 139 136  K 4.2 4.1 3.6  CL 101 106 103  CO2 25 25 24   GLUCOSE 146* 169* 124*  BUN 21* 19 19  CREATININE 0.67 0.68 0.63  CALCIUM 8.6* 8.2* 8.1*  AST 24 23 26   ALT 27 24 23   ALKPHOS 107 93 111  BILITOT 1.0 0.8 1.0   ------------------------------------------------------------------------------------------------------------------ No results for input(s): CHOL, HDL, LDLCALC, TRIG, CHOLHDL, LDLDIRECT in the last 72 hours.  No results found for: HGBA1C ------------------------------------------------------------------------------------------------------------------ No results for input(s): TSH, T4TOTAL, T3FREE, THYROIDAB in the last 72 hours.  Invalid input(s): FREET3 ------------------------------------------------------------------------------------------------------------------ No results for input(s): VITAMINB12, FOLATE, FERRITIN, TIBC, IRON, RETICCTPCT in the last 72 hours.  Coagulation profile No results for input(s): INR, PROTIME in the last 168 hours.  No results for input(s): DDIMER in the last 72 hours.  Cardiac Enzymes No results for input(s): CKMB, TROPONINI, MYOGLOBIN in the last 168 hours.  Invalid input(s): CK ------------------------------------------------------------------------------------------------------------------    Component Value Date/Time   BNP 424.4 (H) 06/24/2016 1604     Roxan Hockey M.D on 01/01/2018 at 6:47 PM  Between 7am to 7pm - Pager - [phone]  After 7pm go to [email or web address] - password [email or web address]  [facility] -   Office  [phone]   Voice Recognition Viviann Spare dictation system was used to create this note, attempts have been made to correct errors. Please contact the author with questions and/or clarifications.

## 2018-01-01 NOTE — Progress Notes (Signed)
[place] for IV heparin Indication: Hx VTE on Eliquis; perioperative bridging  Allergies  Allergen Reactions  . Tape Other (See Comments)    TEARS THE SKIN (SKIN IS THIN!!)  . Latex Rash  . Povidone-Iodine Rash    Patient Measurements: Height: 5\' 6"  (167.6 cm) Weight: 214 lb (97.1 kg) IBW/kg (Calculated) : 59.3 Heparin Dosing Weight: 81  Vital Signs: Temp: 100.5 F (38.1 C) (03/23 1021) Temp Source: Oral (03/23 1021) BP: 138/76 (03/23 0504) Pulse Rate: 88 (03/23 0504)  Labs: Recent Labs    12/31/17 1045 01/01/18 0300 01/01/18 0958  HGB 13.4 11.5*  --   HCT 39.6 35.1*  --   PLT 216 157  --   APTT  --  83* 82*  HEPARINUNFRC  --  1.80*  --   CREATININE 0.67 0.68 0.63    Estimated Creatinine Clearance: 92.2 mL/min (by C-G formula based on SCr of 0.63 mg/dL).   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 Pharmacy consulted to dose heparin while off Eliquis perioperatively. Tentative plan is for Lap choley 48 hrs after last Eliquis if conservative management fails.   Baseline INR, aPTT: not done  Prior anticoagulation: Eliquis 5 mg bid, LD 3/22 at 0500   Today, 3/23  0300 aptt = 83 sec at goal HL=  1.80 still affected by eliquis  1000 am aPTT 82 sec. remains at goal.  Plts= 216>157, H/H=11.5/35.1  Goal of Therapy: Heparin level 0.3-0.7 units/ml Monitor platelets by anticoagulation protocol: Yes Aptt=66-102 sec  Plan:  Continue heparin drip at 1400 units/hr  Using aptt until eliquis affects on HL diminish  Monitor for signs of bleeding or thrombosis  Heparin to be held 3/24 at 02 am for possible OR so will not get aPTT/HL 3/24 am  F/u post op for anticoag orders 3/24   Eudelia Bunch, Pharm.D. [phone] 01/01/2018 11:23 AM

## 2018-01-01 NOTE — Progress Notes (Addendum)
Pt temp 102.9 Pt acting confused. Pt wife stated confusion started 3 days ago Pt given acetaminophen for fever. Rechecked pt temp 100.5 will continue to monitor.

## 2018-01-02 ENCOUNTER — Inpatient Hospital Stay (HOSPITAL_COMMUNITY): Payer: BC Managed Care – PPO | Admitting: Anesthesiology

## 2018-01-02 ENCOUNTER — Encounter (HOSPITAL_COMMUNITY): Payer: Self-pay | Admitting: Certified Registered"

## 2018-01-02 ENCOUNTER — Encounter (HOSPITAL_COMMUNITY): Admission: EM | Disposition: A | Discharge status: Home / Self Care | Payer: Self-pay | Source: Home / Self Care

## 2018-01-02 HISTORY — PX: CHOLECYSTECTOMY: SHX55

## 2018-01-02 LAB — CBC
HEMATOCRIT: 34.7 % — AB (ref 36.0–46.0)
HEMOGLOBIN: 11.5 g/dL — AB (ref 12.0–15.0)
MCH: 30.7 pg (ref 26.0–34.0)
MCHC: 33.1 g/dL (ref 30.0–36.0)
MCV: 92.5 fL (ref 78.0–100.0)
Platelets: 134 10*3/uL — ABNORMAL LOW (ref 150–400)
RBC: 3.75 MIL/uL — AB (ref 3.87–5.11)
RDW: 13.1 % (ref 11.5–15.5)
WBC: 11.8 10*3/uL — AB (ref 4.0–10.5)

## 2018-01-02 SURGERY — LAPAROSCOPIC CHOLECYSTECTOMY WITH INTRAOPERATIVE CHOLANGIOGRAM
Anesthesia: General | Site: Abdomen

## 2018-01-02 SURGERY — Surgical Case
Anesthesia: *Unknown

## 2018-01-02 MED ORDER — ROCURONIUM BROMIDE 10 MG/ML (PF) SYRINGE
PREFILLED_SYRINGE | INTRAVENOUS | Status: AC
  Filled 2018-01-02: qty 10

## 2018-01-02 MED ORDER — MIDAZOLAM HCL 2 MG/2ML IJ SOLN
INTRAMUSCULAR | Status: DC | PRN
  Administered 2018-01-02 (×2): 1 mg via INTRAVENOUS

## 2018-01-02 MED ORDER — PROPOFOL 10 MG/ML IV BOLUS
INTRAVENOUS | Status: AC
  Filled 2018-01-02: qty 20

## 2018-01-02 MED ORDER — DEXAMETHASONE SODIUM PHOSPHATE 10 MG/ML IJ SOLN
INTRAMUSCULAR | Status: DC | PRN
  Administered 2018-01-02: 10 mg via INTRAVENOUS

## 2018-01-02 MED ORDER — HYDROMORPHONE HCL 1 MG/ML IJ SOLN: 0.5000 mg | Freq: Once | INTRAMUSCULAR | Status: DC

## 2018-01-02 MED ORDER — PHENYLEPHRINE 40 MCG/ML (10ML) SYRINGE FOR IV PUSH (FOR BLOOD PRESSURE SUPPORT)
PREFILLED_SYRINGE | INTRAVENOUS | Status: DC | PRN
  Administered 2018-01-02: 120 ug via INTRAVENOUS
  Administered 2018-01-02 (×3): 80 ug via INTRAVENOUS

## 2018-01-02 MED ORDER — BUPIVACAINE-EPINEPHRINE 0.5% -1:200000 IJ SOLN
INTRAMUSCULAR | Status: DC | PRN
  Administered 2018-01-02: 30 mL

## 2018-01-02 MED ORDER — BUPIVACAINE-EPINEPHRINE (PF) 0.5% -1:200000 IJ SOLN
INTRAMUSCULAR | Status: AC
  Filled 2018-01-02: qty 30

## 2018-01-02 MED ORDER — PROPOFOL 10 MG/ML IV BOLUS
INTRAVENOUS | Status: DC | PRN
  Administered 2018-01-02: 130 mg via INTRAVENOUS

## 2018-01-02 MED ORDER — IOPAMIDOL (ISOVUE-300) INJECTION 61%
INTRAVENOUS | Status: AC
  Filled 2018-01-02: qty 50

## 2018-01-02 MED ORDER — SODIUM CHLORIDE 0.9 % IV BOLUS (SEPSIS)
500.0000 mL | Freq: Once | INTRAVENOUS | Status: AC
  Administered 2018-01-02: 500 mL via INTRAVENOUS

## 2018-01-02 MED ORDER — LACTATED RINGERS IR SOLN
Status: DC | PRN
  Administered 2018-01-02: 1000 mL

## 2018-01-02 MED ORDER — FENTANYL CITRATE (PF) 100 MCG/2ML IJ SOLN
INTRAMUSCULAR | Status: AC
  Filled 2018-01-02: qty 2

## 2018-01-02 MED ORDER — 0.9 % SODIUM CHLORIDE (POUR BTL) OPTIME
TOPICAL | Status: DC | PRN
  Administered 2018-01-02: 1000 mL

## 2018-01-02 MED ORDER — ROCURONIUM BROMIDE 10 MG/ML (PF) SYRINGE
PREFILLED_SYRINGE | INTRAVENOUS | Status: DC | PRN
  Administered 2018-01-02: 10 mg via INTRAVENOUS
  Administered 2018-01-02: 5 mg via INTRAVENOUS
  Administered 2018-01-02: 50 mg via INTRAVENOUS

## 2018-01-02 MED ORDER — HYDROMORPHONE HCL 1 MG/ML IJ SOLN: 1.0000 mg | INTRAMUSCULAR | Status: DC | PRN

## 2018-01-02 MED ORDER — FENTANYL CITRATE (PF) 250 MCG/5ML IJ SOLN
INTRAMUSCULAR | Status: AC
  Filled 2018-01-02: qty 5

## 2018-01-02 MED ORDER — SUGAMMADEX SODIUM 200 MG/2ML IV SOLN
INTRAVENOUS | Status: DC | PRN
  Administered 2018-01-02: 200 mg via INTRAVENOUS

## 2018-01-02 MED ORDER — LIDOCAINE 2% (20 MG/ML) 5 ML SYRINGE
INTRAMUSCULAR | Status: DC | PRN
  Administered 2018-01-02: 60 mg via INTRAVENOUS

## 2018-01-02 MED ORDER — HYDROMORPHONE HCL 1 MG/ML IJ SOLN: 0.2500 mg | INTRAMUSCULAR | Status: DC | PRN

## 2018-01-02 MED ORDER — EPHEDRINE 5 MG/ML INJ
INTRAVENOUS | Status: AC
Start: 2018-01-02 — End: ?
  Filled 2018-01-02: qty 30

## 2018-01-02 MED ORDER — PHENYLEPHRINE HCL 10 MG/ML IJ SOLN
INTRAMUSCULAR | Status: AC
  Filled 2018-01-02: qty 1

## 2018-01-02 MED ORDER — LIDOCAINE 2% (20 MG/ML) 5 ML SYRINGE
INTRAMUSCULAR | Status: AC
  Filled 2018-01-02: qty 10

## 2018-01-02 MED ORDER — ONDANSETRON HCL 4 MG/2ML IJ SOLN
INTRAMUSCULAR | Status: AC
  Filled 2018-01-02: qty 4

## 2018-01-02 MED ORDER — PHENYLEPHRINE 40 MCG/ML (10ML) SYRINGE FOR IV PUSH (FOR BLOOD PRESSURE SUPPORT)
PREFILLED_SYRINGE | INTRAVENOUS | Status: AC
  Filled 2018-01-02: qty 10

## 2018-01-02 MED ORDER — SUCCINYLCHOLINE CHLORIDE 200 MG/10ML IV SOSY
PREFILLED_SYRINGE | INTRAVENOUS | Status: AC
  Filled 2018-01-02: qty 20

## 2018-01-02 MED ORDER — FENTANYL CITRATE (PF) 250 MCG/5ML IJ SOLN
INTRAMUSCULAR | Status: DC | PRN
  Administered 2018-01-02 (×5): 50 ug via INTRAVENOUS

## 2018-01-02 MED ORDER — MORPHINE SULFATE (PF) 2 MG/ML IV SOLN
2.0000 mg | INTRAVENOUS | Status: DC | PRN
  Administered 2018-01-02 – 2018-01-03 (×3): 2 mg via INTRAVENOUS
  Filled 2018-01-02 (×3): qty 1

## 2018-01-02 MED ORDER — DEXAMETHASONE SODIUM PHOSPHATE 10 MG/ML IJ SOLN
INTRAMUSCULAR | Status: AC
  Filled 2018-01-02: qty 2

## 2018-01-02 MED ORDER — ONDANSETRON HCL 4 MG/2ML IJ SOLN
INTRAMUSCULAR | Status: DC | PRN
  Administered 2018-01-02: 4 mg via INTRAVENOUS

## 2018-01-02 MED ORDER — FENTANYL CITRATE (PF) 100 MCG/2ML IJ SOLN
INTRAMUSCULAR | Status: DC | PRN
  Administered 2018-01-02 (×4): 50 ug via INTRAVENOUS

## 2018-01-02 MED ORDER — LACTATED RINGERS IV SOLN
INTRAVENOUS | Status: DC | PRN
  Administered 2018-01-02 (×2): via INTRAVENOUS

## 2018-01-02 MED ORDER — MIDAZOLAM HCL 2 MG/2ML IJ SOLN
INTRAMUSCULAR | Status: AC
  Filled 2018-01-02: qty 2

## 2018-01-02 SURGICAL SUPPLY — 43 items
APPLICATOR ARISTA FLEXITIP XL (MISCELLANEOUS) ×3 IMPLANT
APPLIER CLIP ROT 10 11.4 M/L (STAPLE) ×3
CABLE HIGH FREQUENCY MONO STRZ (ELECTRODE) ×3 IMPLANT
CATH REDDICK CHOLANGI 4FR 50CM (CATHETERS) IMPLANT
CHLORAPREP W/TINT 26ML (MISCELLANEOUS) ×3 IMPLANT
CLIP APPLIE ROT 10 11.4 M/L (STAPLE) ×1 IMPLANT
COVER MAYO STAND STRL (DRAPES) ×3 IMPLANT
COVER SURGICAL LIGHT HANDLE (MISCELLANEOUS) ×3 IMPLANT
DECANTER SPIKE VIAL GLASS SM (MISCELLANEOUS) ×3 IMPLANT
DERMABOND ADVANCED (GAUZE/BANDAGES/DRESSINGS) ×2
DERMABOND ADVANCED .7 DNX12 (GAUZE/BANDAGES/DRESSINGS) ×1 IMPLANT
DRAIN CHANNEL RND F F (WOUND CARE) ×3 IMPLANT
DRAPE C-ARM 42X120 X-RAY (DRAPES) ×3 IMPLANT
ELECT REM PT RETURN 15FT ADLT (MISCELLANEOUS) ×3 IMPLANT
ENDOLOOP SUT PDS II  0 18 (SUTURE) ×2
ENDOLOOP SUT PDS II 0 18 (SUTURE) ×1 IMPLANT
EVACUATOR SILICONE 100CC (DRAIN) ×3 IMPLANT
GLOVE BIOGEL PI IND STRL 7.5 (GLOVE) ×1 IMPLANT
GLOVE BIOGEL PI INDICATOR 7.5 (GLOVE) ×2
GLOVE ECLIPSE 7.5 STRL STRAW (GLOVE) ×3 IMPLANT
GOWN STRL REUS W/TWL XL LVL3 (GOWN DISPOSABLE) ×9 IMPLANT
HEMOSTAT ARISTA ABSORB 3G PWDR (MISCELLANEOUS) ×3 IMPLANT
HEMOSTAT SNOW SURGICEL 2X4 (HEMOSTASIS) IMPLANT
HEMOSTAT SURGICEL 4X8 (HEMOSTASIS) IMPLANT
KIT BASIN OR (CUSTOM PROCEDURE TRAY) ×3 IMPLANT
POUCH RETRIEVAL ECOSAC 10 (ENDOMECHANICALS) IMPLANT
POUCH RETRIEVAL ECOSAC 10MM (ENDOMECHANICALS)
SCISSORS LAP 5X35 DISP (ENDOMECHANICALS) ×3 IMPLANT
SET CHOLANGIOGRAPH MIX (MISCELLANEOUS) ×3 IMPLANT
SET IRRIG TUBING LAPAROSCOPIC (IRRIGATION / IRRIGATOR) ×3 IMPLANT
SLEEVE XCEL OPT CAN 5 100 (ENDOMECHANICALS) ×3 IMPLANT
SPONGE DRAIN TRACH 4X4 STRL 2S (GAUZE/BANDAGES/DRESSINGS) ×3 IMPLANT
SUT ETHILON 3 0 PS 1 (SUTURE) ×3 IMPLANT
SUT MNCRL AB 4-0 PS2 18 (SUTURE) ×3 IMPLANT
SUT VIC AB 3-0 SH 27 (SUTURE) ×2
SUT VIC AB 3-0 SH 27XBRD (SUTURE) ×1 IMPLANT
TAPE CLOTH SURG 4X10 WHT LF (GAUZE/BANDAGES/DRESSINGS) ×3 IMPLANT
TOWEL OR 17X26 10 PK STRL BLUE (TOWEL DISPOSABLE) ×3 IMPLANT
TRAY LAPAROSCOPIC (CUSTOM PROCEDURE TRAY) ×3 IMPLANT
TROCAR BLADELESS OPT 5 100 (ENDOMECHANICALS) ×3 IMPLANT
TROCAR XCEL BLUNT TIP 100MML (ENDOMECHANICALS) ×3 IMPLANT
TROCAR XCEL NON-BLD 11X100MML (ENDOMECHANICALS) ×3 IMPLANT
TUBING INSUF HEATED (TUBING) ×3 IMPLANT

## 2018-01-02 NOTE — Progress Notes (Signed)
Pajamas (2 piece) removed and placed in labeled belongings bag and secured to pt bed.

## 2018-01-02 NOTE — Anesthesia Procedure Notes (Signed)
Date/Time: 01/02/2018 1:16 PM Performed by: Cynda Familia, CRNA Oxygen Delivery Method: Simple face mask Placement Confirmation: positive ETCO2 and breath sounds checked- equal and bilateral Dental Injury: Teeth and Oropharynx as per pre-operative assessment

## 2018-01-02 NOTE — Progress Notes (Signed)
Patient ID: Joyce Payne, female   DOB: 12/01/1960, 57 y.o.   MRN: [id number]     Subjective: Still with right upper quadrant pain unchanged.  Also complaining of headache and dry mouth.  She is more alert and appropriate this morning without confusion  Objective: Vital signs in last 24 hours: Temp:  [97.5 F (36.4 C)-102.9 F (39.4 C)] 98.5 F (36.9 C) (03/24 0512) Pulse Rate:  [88-94] 92 (03/24 0512) Resp:  [16] 16 (03/24 0512) BP: (97-112)/(53-62) 99/53 (03/24 0512) SpO2:  [95 %-100 %] 97 % (03/24 0512) Last BM Date: 12/30/17  Intake/Output from previous day: 03/23 0701 - 03/24 0700 In: 1118 [I.V.:1068; IV Piggyback:50] Out: 2725 [Urine:1450] Intake/Output this shift: No intake/output data recorded.  General appearance: alert, cooperative and no distress Resp: clear to auscultation bilaterally GI: Localized tenderness right upper quadrant  Lab Results:  Recent Labs    01/01/18 0300 01/02/18 0412  WBC 14.2* 11.8*  HGB 11.5* 11.5*  HCT 35.1* 34.7*  PLT 157 134*   BMET Recent Labs    01/01/18 0300 01/01/18 0958  NA 139 136  K 4.1 3.6  CL 106 103  CO2 25 24  GLUCOSE 169* 124*  BUN 19 19  CREATININE 0.68 0.63  CALCIUM 8.2* 8.1*     Studies/Results: Dg Chest 2 View  Result Date: 12/31/2017 CLINICAL DATA:  Fever of 103.1.  Abdominal pain. EXAM: CHEST - 2 VIEW COMPARISON:  12/04/2016. FINDINGS: Poor inspiration. Grossly stable normal sized heart. Interval linear and patchy density in the left lower lobe and probable small left pleural effusion. Clear right lung. Multiple old, healed right rib fractures. IMPRESSION: Interval left lower lobe atelectasis and possible pneumonia with a probable small left pleural effusion. Electronically Signed   By: Claudie Revering M.D.   On: 12/31/2017 10:18   Ct Abdomen Pelvis W Contrast  Result Date: 12/31/2017 CLINICAL DATA:  Acute abdominal pain, fever, history of lymphoma now in remission, elevated white count EXAM: CT ABDOMEN  AND PELVIS WITH CONTRAST TECHNIQUE: Multidetector CT imaging of the abdomen and pelvis was performed using the standard protocol following bolus administration of intravenous contrast. CONTRAST:  173mL ISOVUE-300 IOPAMIDOL (ISOVUE-300) INJECTION 61% COMPARISON:  02/02/2017 FINDINGS: Lower chest: Bandlike bibasilar atelectasis, worse on the left. No significant pleural effusion. Normal heart size. No pericardial effusion. Hepatobiliary: Small left hepatic dome hypodense cysts, unchanged. Mild central biliary prominence. No other focal hepatic abnormality. Hepatic and portal veins remain patent. Gallbladder is distended with mucosal enhancement mild surrounding inflammatory changes, and trace pericholecystic fluid. Appearance compatible with acute cholecystitis. Adjacent inflammatory reactive changes noted along the duodenum, pancreatic head, and adjacent colon. No fluid collection or abscess. Pancreas: No focal abnormality or ductal dilatation. Spleen: Normal in size without focal abnormality. Adrenals/Urinary Tract: Adrenal glands are unremarkable. Kidneys are normal, without renal calculi, focal lesion, or hydronephrosis. Bladder is unremarkable. Stomach/Bowel: postop changes from gastric bypass. Reactive inflammatory changes around duodenum in the right upper quadrant to cholecystitis. Negative for bowel obstruction, significant dilatation, ileus, free air. Appendix is unremarkable. Vascular/Lymphatic: Aortic atherosclerosis. Negative for aneurysm. Adenopathy. Reproductive: Midline pelvic complex partially cystic mass measuring 7 x 9 cm, image 67 series 2. Consider follow-up evaluation with transvaginal pelvic ultrasound further characterization. 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 Other: No inguinal or abdominal wall hernia. No significant ascites. Musculoskeletal: Lower lumbar degenerative changes noted. No acute compression fracture. IMPRESSION: Distended gallbladder with mucosal  enhancement surrounding inflammatory changes, and trace pericholecystic fluid compatible with acute cholecystitis. These  results were called by telephone at the time of interpretation on 12/31/2017 at 12:06 pm to Dr. Aletta Edouard , who verbally acknowledged these results. No associated biliary obstruction or dilatation. 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 This recommendation follows ACR consensus guidelines: White Paper of the ACR Incidental Findings Committee II on Adnexal Findings. J Am Coll Radiol [id number]. Electronically Signed   By: Jerilynn Mages.  Shick M.D.   On: 12/31/2017 12:07   US Abdomen Limited  Result Date: 12/31/2017 CLINICAL DATA:  Cholecystitis by CT EXAM: ULTRASOUND ABDOMEN LIMITED RIGHT UPPER QUADRANT COMPARISON:  12/31/2017 FINDINGS: Gallbladder: Moderate gallbladder distention with mild wall thickening measuring 3.3 mm. Trace pericholecystic fluid. Murphys sign is elicited over the gallbladder during the exam. Gallbladder sludge noted. Findings compatible with cholecystitis. Common bile duct: Diameter: 6 mm. Liver: No focal abnormality. Normal echogenicity. Mild diffuse intrahepatic biliary dilatation. Portal vein is patent on color Doppler imaging with normal direction of blood flow towards the liver. IMPRESSION: Distended gallbladder with mild wall thickening, ultrasound findings suggesting acute cholecystitis as above. Mild intrahepatic biliary dilatation Electronically Signed   By: Jerilynn Mages.  Shick M.D.   On: 12/31/2017 14:17    Anti-infectives: Anti-infectives (From admission, onward)   Start     Dose/Rate Route Frequency Ordered Stop   12/31/17 1400  piperacillin-tazobactam (ZOSYN) IVPB 3.375 g     3.375 g 12.5 mL/hr over 240 Minutes Intravenous Every  8 hours 12/31/17 1335     12/31/17 1215  cefTRIAXone (ROCEPHIN) 1 g in sodium chloride 0.9 % 100 mL IVPB     1 g 200 mL/hr over 30 Minutes Intravenous  Once 12/31/17 1200 12/31/17 1345   12/31/17 1200  metroNIDAZOLE (FLAGYL) IVPB 500 mg  Status:  Discontinued     500 mg 100 mL/hr over 60 Minutes Intravenous  Once 12/31/17 1200 12/31/17 1343      Assessment/Plan: Cholelithiasis and acute cholecystitis by CT scan and ultrasound.  Clinical picture fits as well.  Febrile to 102 degrees overnight and still with pain and tenderness.  This is her second day off of Eliquis.  Heparin held early this morning.  I recommended proceeding with laparoscopic cholecystectomy with cholangiogram today.  We will over the indications and nature of the procedure and possible risks including increased risk of bleeding due to her anticoagulation, increased risk of open procedure due to her previous line incision and gastric bypass as well as injury to common bile duct or surrounding structures.  She understands and desires to proceed.    LOS: 2 days    Edward Jolly 01/02/2018

## 2018-01-02 NOTE — Anesthesia Procedure Notes (Signed)
Procedure Name: Intubation Date/Time: 01/02/2018 11:23 AM Performed by: Cynda Familia, CRNA Pre-anesthesia Checklist: Patient identified, Emergency Drugs available, Suction available and Patient being monitored Patient Re-evaluated:Patient Re-evaluated prior to induction Oxygen Delivery Method: Circle System Utilized Preoxygenation: Pre-oxygenation with 100% oxygen Induction Type: IV induction Ventilation: Mask ventilation without difficulty Grade View: Grade I Tube type: Parker flex tip Number of attempts: 1 Airway Equipment and Method: Stylet and Video-laryngoscopy Placement Confirmation: ETT inserted through vocal cords under direct vision,  positive ETCO2 and breath sounds checked- equal and bilateral Tube secured with: Tape Dental Injury: Teeth and Oropharynx as per pre-operative assessment  Comments: Smooth IV induction Ola Spurr--- intubation AM CRNA atraumatic-- teeth and mouth as preop-- bilat BS Ola Spurr

## 2018-01-02 NOTE — Op Note (Signed)
Preoperative Diagnosis: Acute cholecystitis and cholelithiasis  Postoprative Diagnosis: Same  Procedure: Procedure(s): LAPAROSCOPIC CHOLECYSTECTOMY    Surgeon: Excell Seltzer T   Assistants: None  Anesthesia:  General endotracheal anesthesia  Indications: Patient is a 57 year old female with history of open gastric bypass who presents with 2-3 days of severe right upper quadrant abdominal pain and nausea.  CT and ultrasound have shown cholelithiasis with evidence of acute cholecystitis.  She was started on IV antibiotics and surgery delayed for 48 hours as the patient was on Eliquis at presentation for history of saddle pulmonary embolus.  I recommended proceeding with urgent laparoscopic cholecystectomy.  The procedure and indications and risks have been discussed with the patient and detailed elsewhere and she agrees to proceed.    Procedure Detail: Patient was brought to the operating room, placed in the supine position on the operating table, and general endotracheal anesthesia induced.  She was already on broad-spectrum IV antibiotics.  The abdomen was widely sterilely prepped and draped.  Patient timeout was performed and correct procedure verified.  Due to obesity I used an incision above the umbilicus in the midline below her previous open incision about 2 cm which was carried down to the midline fascia.  This was incised for 1-1/2 cm and the peritoneum entered under direct vision.  Through a mattress suture of 0 Vicryl the Hassan trocar was placed and pneumoperitoneum established.  An 11 mm trocar was placed subxiphoid and 2 5 mm trochars along the right subcostal margin.  There was noted to be edema and dense adhesions up to the gallbladder.  Omental adhesions and adhesions of the proximal transverse colon were carefully taken down off of the gallbladder which was found to be tensely distended and severely acutely and subacutely inflamed.  The dissection proceeded very slowly, staying  on the gallbladder wall and bluntly dissecting soft tissue off.  The fundus was able to be grasped and elevated.  The dissection continued distally.  We entered an abscess cavity with frank purulent material adjacent to the infundibulum of the gallbladder which was aspirated and drained.  Continued with very tedious blunt and cautery dissection right on the gallbladder wall working distally toward the porta hepatis.  I stayed right on the gallbladder wall and as we got down to the infundibulum the wall was actually somewhat necrotic but still had integrity.  I was able to dissect the gallbladder away from the liver down toward the critical structures and mobilized the distal gallbladder off the cholecystic plate.  With careful slow blunt dissection the distal gallbladder was thoroughly dissected under direct vision and eventually a good critical view was obtained with 2 isolated structures and the distal gallbladder fully dissected off of the liver.  This appeared entirely consistent with the cystic artery coursing up onto the gallbladder wall superiorly and the cystic duct more inferiorly.  The cystic duct gallbladder junction was dissected 360 degrees.  About 1-1/2 cm a cystic duct was dissected free.  The tissue was of poor quality and with the difficult exposure I elected not to proceed with cholangiogram.  The cystic artery was doubly clipped proximally clipped distally and divided and was confirmed to be a small artery.  I then divided the cystic duct actually slightly up onto the gallbladder to allow a cuff for retraction and then the cystic duct was closed with a 2-0 PDS Endo Close.  This closure appeared secure.  The gallbladder was then dissected free from its bed using blunt and cautery dissection.  It was placed in an eco-sac and brought out through the umbilical incision.  The right upper quadrant was then thoroughly irrigated and hemostasis assured.  I additionally coated the raw areas of the liver bed  and omentum with Arista.  A 19 Blake closed suction drain was left in Morison's pouch and brought out through the lateral trocar site.  There was no evidence of bleeding or injury.  All CO2 was evacuated and trochars removed.  The mattress suture was secured at the supraumbilical incision.  Skin incisions were closed with subcuticular Monocryl and Dermabond after closing the subcu of the periumbilical incision with 3-0 Vicryl.  Sponge needle and instrument counts were correct.    Findings: Severe acute and subacute cholecystitis with pericholecystic abscess  Estimated Blood Loss:  less than 100 mL         Drains: 19 Blake drain in right upper quadrant  Blood Given: none          Specimens: Gallbladder and contents        Complications:  * No complications entered in OR log *         Disposition: PACU - hemodynamically stable.         Condition: stable

## 2018-01-02 NOTE — Transfer of Care (Signed)
Immediate Anesthesia Transfer of Care Note  Patient: Joyce Payne  Procedure(s) Performed: LAPAROSCOPIC CHOLECYSTECTOMY WITH INTRAOPERATIVE CHOLANGIOGRAM (N/A Abdomen)  Patient Location: PACU  Anesthesia Type:General  Level of Consciousness: awake and alert   Airway & Oxygen Therapy: Patient Spontanous Breathing and Patient connected to face mask oxygen  Post-op Assessment: Report given to RN and Post -op Vital signs reviewed and stable  Post vital signs: Reviewed and stable  Last Vitals:  Vitals Value Taken Time  BP 121/47 01/02/2018  1:30 PM  Temp    Pulse 106 01/02/2018  1:31 PM  Resp 21 01/02/2018  1:31 PM  SpO2 100 % 01/02/2018  1:31 PM  Vitals shown include unvalidated device data.  Last Pain:  Vitals:   01/02/18 0836  TempSrc:   PainSc: Asleep      Patients Stated Pain Goal: 3 (19/75/88 3254)  Complications: No apparent anesthesia complications

## 2018-01-02 NOTE — Anesthesia Preprocedure Evaluation (Addendum)
Anesthesia Evaluation  Patient identified by MRN, date of birth, ID band Patient awake    Reviewed: Allergy & Precautions, H&P , NPO status , Patient's Chart, lab work & pertinent test results  Airway Mallampati: III  TM Distance: >3 FB Neck ROM: Full    Dental no notable dental hx. (+) Teeth Intact, Dental Advisory Given   Pulmonary former smoker, PE   Pulmonary exam normal breath sounds clear to auscultation       Cardiovascular + Peripheral Vascular Disease and + DVT  negative cardio ROS   Rhythm:Regular Rate:Normal     Neuro/Psych  Headaches, Anxiety Depression    GI/Hepatic Neg liver ROS, GERD  Medicated and Controlled,  Endo/Other  negative endocrine ROS  Renal/GU negative Renal ROS  negative genitourinary   Musculoskeletal  (+) Arthritis , Osteoarthritis,    Abdominal   Peds  Hematology negative hematology ROS (+)   Anesthesia Other Findings   Reproductive/Obstetrics negative OB ROS                            Anesthesia Physical Anesthesia Plan  ASA: III  Anesthesia Plan: General   Post-op Pain Management:    Induction: Intravenous  PONV Risk Score and Plan: 4 or greater and Ondansetron, Dexamethasone and Midazolam  Airway Management Planned: Oral ETT and Video Laryngoscope Planned  Additional Equipment:   Intra-op Plan:   Post-operative Plan: Extubation in OR  Informed Consent: I have reviewed the patients History and Physical, chart, labs and discussed the procedure including the risks, benefits and alternatives for the proposed anesthesia with the patient or authorized representative who has indicated his/her understanding and acceptance.   Dental advisory given  Plan Discussed with: CRNA  Anesthesia Plan Comments:        Anesthesia Quick Evaluation

## 2018-01-02 NOTE — Progress Notes (Addendum)
[place] for IV heparin Indication: Hx VTE on Eliquis; perioperative bridging  Allergies  Allergen Reactions  . Tape Other (See Comments)    TEARS THE SKIN (SKIN IS THIN!!)  . Latex Rash  . Povidone-Iodine Rash    Patient Measurements: Height: 5\' 6"  (167.6 cm) Weight: 214 lb (97.1 kg) IBW/kg (Calculated) : 59.3 Heparin Dosing Weight: 81  Vital Signs: Temp: 98.5 F (36.9 C) (03/24 0512) Temp Source: Oral (03/24 0512) BP: 99/53 (03/24 0512) Pulse Rate: 92 (03/24 0512)  Labs: Recent Labs    12/31/17 1045 01/01/18 0300 01/01/18 0958 01/02/18 0412  HGB 13.4 11.5*  --  11.5*  HCT 39.6 35.1*  --  34.7*  PLT 216 157  --  134*  APTT  --  83* 82*  --   HEPARINUNFRC  --  1.80*  --   --   CREATININE 0.67 0.68 0.63  --     Estimated Creatinine Clearance: 92.2 mL/min (by C-G formula based on SCr of 0.63 mg/dL).   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 Pharmacy consulted to dose heparin while off Eliquis perioperatively. Tentative plan is for Lap choley 48 hrs after last Eliquis if conservative management fails.   Baseline INR, aPTT: not done  Prior anticoagulation: Eliquis 5 mg bid, LD 3/22 at 0500   Today, 3/23  Heparin off at 02 am per [facility] orders  Plts= 216>157>134, H/H=11.5/34.7  Goal of Therapy: Heparin level 0.3-0.7 units/ml Monitor platelets by anticoagulation protocol: Yes Aptt=66-102 sec  Plan:  Heparin held 3/24 at 02 am for OR  F/u post op for anticoag orders   Eudelia Bunch, Pharm.D. [phone] 01/02/2018 9:19 AM

## 2018-01-02 NOTE — Progress Notes (Signed)
Patient Demographics:    Joyce Payne, is a 57 y.o. female, DOB - Mar 17, 1961, TMH:[id number]  Admit date - 12/31/2017   Admitting Physician Md Edison Pace, MD  Outpatient Primary MD for the patient is Lawerance Cruel, MD  LOS - 2  Chief Complaint  Patient presents with  . 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  Blood studies consult requested on 12/31/2017 for management of anticoagulation, patient is currently on heparin drip bridge to allow for lap chole on perioperative care.     Plan:-  1)Acute cholecystitis and cholelithiasis-  on 01/02/18  she underwent lap chole by Dr Excell Seltzer with findings of Severe acute and subacute cholecystitis with pericholecystic abscess.  White count is down to 11 K, fever, which is trending down patient currently on Zosyn, patient has a tendency towards hypotension with IV narcotics so will be judicious  2)H/o Bil pulmonary embolism/H/o DVT-   last dose of Eliquis was in the a.m. of 12/30/2017, continue IV heparin to bridge patient for now.  The plan previously was for patient to be on lifelong anticoagulation, use SCDs in the meantime, restart Eliquis when okay with surgical team  3)Hodgkin's lymphoma in remission- -She is status  post chemotherapy.  Currently in remission.  4)Obesity-prior gastric bypass surgery over 20 years ago  Code Status : Full  Disposition Plan  : TBD  Consults  : General surgery is primary team, hospitalist service is consulted for medical management of anticoagulation in a patient with history of DVT and PE   DVT Prophylaxis  : IV heparin/SCDs   Lab Results  Component Value Date   PLT 134 (L) 01/02/2018    Inpatient Medications  Scheduled Meds: . chlorhexidine  15 mL Mouth Rinse BID  . DULoxetine  90 mg Oral Daily  . fesoterodine  8 mg Oral QHS  . lisdexamfetamine  50 mg Oral Daily  . mouth rinse  15 mL Mouth Rinse q12n4p  . multivitamin with minerals  1 tablet Oral Daily  . pantoprazole (PROTONIX) IV  40 mg Intravenous QHS  . pregabalin  50 mg Oral BID  . senna  1 tablet Oral BID   Continuous Infusions: . dextrose 5 % and 0.45 % NaCl with KCl 20 mEq/L 75 mL/hr at 01/01/18 1850  . piperacillin-tazobactam (ZOSYN)  IV Stopped (01/02/18 0842)   PRN Meds:.acetaminophen **OR** acetaminophen, diazepam, metoprolol tartrate, morphine injection, ondansetron **OR** ondansetron (ZOFRAN) IV, oxyCODONE, polyethylene glycol, traMADol, traZODone    Anti-infectives (From admission, onward)   Start     Dose/Rate Route Frequency Ordered Stop   12/31/17 1400  piperacillin-tazobactam (ZOSYN) IVPB 3.375 g     3.375  g 12.5 mL/hr over 240 Minutes Intravenous Every 8 hours 12/31/17 1335     12/31/17 1215  cefTRIAXone (ROCEPHIN) 1 g in sodium chloride 0.9 % 100 mL IVPB     1 g 200 mL/hr over 30 Minutes Intravenous  Once 12/31/17 1200 12/31/17 1345   12/31/17 1200  metroNIDAZOLE (FLAGYL) IVPB 500 mg  Status:  Discontinued     500 mg 100 mL/hr over 60 Minutes Intravenous  Once 12/31/17 1200 12/31/17 1343        Objective:   Vitals:   01/02/18 1415 01/02/18 1441 01/02/18 1548 01/02/18 1642  BP: (!) 117/59 (!) 121/54 (!) 113/49 (!) 120/52  Pulse: (!) 103 (!) 101 98 94  Resp: 13 14 16 16     Temp: 98.2 F (36.8 C) 98.3 F (36.8 C) 98.5 F (36.9 C) 98.4 F (36.9 C)  TempSrc:   Oral Oral  SpO2: 100% 100% 99% 97%  Weight:      Height:        Wt Readings from Last 3 Encounters:  12/31/17 97.1 kg (214 lb)  01/19/17 94.9 kg (209 lb 3.2 oz)  01/12/17 96 kg (211 lb 11.2 oz)     Intake/Output Summary (Last 24 hours) at 01/02/2018 1650 Last data filed at 01/02/2018 1642 Gross per 24 hour  Intake 1676 ml  Output 1280 ml  Net 396 ml     Physical Exam  Gen:- Awake Alert, obese HEENT:- [place].AT, No sclera icterus Neck-Supple Neck,No JVD,.  Lungs-  CTAB , diminished in bases CV- S1, S2 normal Abd-  +ve B.Sounds, Abd Soft, appropriate postop tenderness, right upper quadrant JP drain with sero-sanguinous drainage    Extremity/Skin:- No  edema, pedal pulses are good Psych-affect is appropriate, oriented x3 Neuro-no new focal deficits, no tremors   Data Review:   Micro Results Recent Results (from the past 240 hour(s))  Blood Culture (routine x 2)     Status: None (Preliminary result)   Collection Time: 12/31/17 10:45 AM  Result Value Ref Range Status   Specimen Description   Final    BLOOD LEFT ANTECUBITAL Performed at [facility] [address], [place], [place] [address]    Special Requests   Final    BOTTLES DRAWN AEROBIC AND ANAEROBIC Blood Culture adequate volume Performed at [facility] [address], [place], [place] [address]    Culture   Final    NO GROWTH 2 DAYS Performed at [facility] [address], [place], [place] [address]    Report Status PENDING  Incomplete  Blood Culture (routine x 2)     Status: None (Preliminary result)   Collection Time: 12/31/17 10:54 AM  Result Value Ref Range Status   Specimen Description   Final    BLOOD BLOOD RIGHT FOREARM Performed at [facility] [address] [place], [place] [address]    Special Requests   Final    BOTTLES DRAWN AEROBIC  AND ANAEROBIC Blood Culture adequate volume Performed at [facility] [address] [place], [place] [address]    Culture   Final    NO GROWTH 2 DAYS Performed at [facility] [address] [place], [place] [address]    Report Status PENDING  Incomplete    Radiology Reports Dg Chest 2 View  Result Date: 12/31/2017 CLINICAL DATA:  Fever of 103.1.  Abdominal pain. EXAM: CHEST - 2 VIEW COMPARISON:  12/04/2016. FINDINGS: Poor inspiration. Grossly stable normal  sized heart. Interval linear and patchy density in the left lower lobe and probable small left pleural effusion. Clear right lung. Multiple old, healed right rib fractures. IMPRESSION: Interval left lower lobe atelectasis and possible pneumonia with a probable small left pleural effusion. Electronically Signed   By: Claudie Revering M.D.   On: 12/31/2017 10:18   Ct Abdomen Pelvis W Contrast  Result Date: 12/31/2017 CLINICAL DATA:  Acute abdominal pain, fever, history of lymphoma now in remission, elevated white count EXAM: CT ABDOMEN AND PELVIS WITH CONTRAST TECHNIQUE: Multidetector CT imaging of the abdomen and pelvis was performed using the standard protocol following bolus administration of intravenous contrast. CONTRAST:  158mL ISOVUE-300 IOPAMIDOL (ISOVUE-300) INJECTION 61% COMPARISON:  02/02/2017 FINDINGS: Lower chest: Bandlike bibasilar atelectasis, worse on the left. No significant pleural effusion. Normal heart size. No pericardial effusion. Hepatobiliary: Small left hepatic dome hypodense cysts, unchanged. Mild central biliary prominence. No other focal hepatic abnormality. Hepatic and portal veins remain patent. Gallbladder is distended with mucosal enhancement mild surrounding inflammatory changes, and trace pericholecystic fluid. Appearance compatible with acute cholecystitis. Adjacent inflammatory reactive changes noted along the duodenum, pancreatic head, and adjacent colon. No fluid collection or abscess.  Pancreas: No focal abnormality or ductal dilatation. Spleen: Normal in size without focal abnormality. Adrenals/Urinary Tract: Adrenal glands are unremarkable. Kidneys are normal, without renal calculi, focal lesion, or hydronephrosis. Bladder is unremarkable. Stomach/Bowel: postop changes from gastric bypass. Reactive inflammatory changes around duodenum in the right upper quadrant to cholecystitis. Negative for bowel obstruction, significant dilatation, ileus, free air. Appendix is unremarkable. Vascular/Lymphatic: Aortic atherosclerosis. Negative for aneurysm. Adenopathy. Reproductive: Midline pelvic complex partially cystic mass measuring 7 x 9 cm, image 67 series 2. Consider follow-up evaluation with transvaginal pelvic ultrasound further characterization. 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 Other: No inguinal or abdominal wall hernia. No significant ascites. Musculoskeletal: Lower lumbar degenerative changes noted. No acute compression fracture. IMPRESSION: Distended gallbladder with mucosal enhancement surrounding inflammatory changes, and trace pericholecystic fluid compatible with acute cholecystitis. These results were called by telephone at the time of interpretation on 12/31/2017 at 12:06 pm to Dr. Aletta Edouard , who verbally acknowledged these results. No associated biliary obstruction or dilatation. 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 This recommendation follows ACR consensus guidelines: White Paper of the ACR Incidental Findings Committee II on Adnexal Findings. J Am Coll Radiol [phone]. Electronically Signed   By: Jerilynn Mages.  Shick M.D.   On: 12/31/2017 12:07   US Abdomen Limited  Result Date: 12/31/2017 CLINICAL DATA:   Cholecystitis by CT EXAM: ULTRASOUND ABDOMEN LIMITED RIGHT UPPER QUADRANT COMPARISON:  12/31/2017 FINDINGS: Gallbladder: Moderate gallbladder distention with mild wall thickening measuring 3.3 mm. Trace pericholecystic fluid. Murphys sign is elicited over the gallbladder during the exam. Gallbladder sludge noted. Findings compatible with cholecystitis. Common bile duct: Diameter: 6 mm. Liver: No focal abnormality. Normal echogenicity. Mild diffuse intrahepatic biliary dilatation. Portal vein is patent on color Doppler imaging with normal direction of blood flow towards the liver. IMPRESSION: Distended gallbladder with mild wall thickening, ultrasound findings suggesting acute cholecystitis as above. Mild intrahepatic biliary dilatation Electronically Signed   By: Jerilynn Mages.  Shick M.D.   On: 12/31/2017 14:17     CBC Recent Labs  Lab 12/31/17 1045 01/01/18 0300 01/02/18 0412  WBC 16.1* 14.2* 11.8*  HGB 13.4 11.5* 11.5*  HCT 39.6 35.1* 34.7*  PLT 216 157 134*  MCV 93.8 94.4 92.5  MCH 31.8 30.9 30.7  MCHC 33.8 32.8 33.1  RDW 12.8 12.9 13.1  LYMPHSABS 1.0  --   --   MONOABS 1.6*  --   --   EOSABS 0.0  --   --   BASOSABS 0.0  --   --     Chemistries  Recent Labs  Lab 12/31/17 1045 01/01/18 0300 01/01/18 0958  NA 135 139 136  K 4.2 4.1 3.6  CL 101 106 103  CO2 25 25 24   GLUCOSE 146* 169* 124*  BUN 21* 19 19  CREATININE 0.67 0.68 0.63  CALCIUM 8.6* 8.2* 8.1*  AST 24 23 26   ALT 27 24 23   ALKPHOS 107 93 111  BILITOT 1.0 0.8 1.0   ------------------------------------------------------------------------------------------------------------------ No results for input(s): CHOL, HDL, LDLCALC, TRIG, CHOLHDL, LDLDIRECT in the last 72 hours.  No results found for: HGBA1C ------------------------------------------------------------------------------------------------------------------ No results for input(s): TSH, T4TOTAL, T3FREE, THYROIDAB in the last 72 hours.  Invalid input(s):  FREET3 ------------------------------------------------------------------------------------------------------------------ No results for input(s): VITAMINB12, FOLATE, FERRITIN, TIBC, IRON, RETICCTPCT in the last 72 hours.  Coagulation profile No results for input(s): INR, PROTIME in the last 168 hours.  No results for input(s): DDIMER in the last 72 hours.  Cardiac Enzymes No results for input(s): CKMB, TROPONINI, MYOGLOBIN in the last 168 hours.  Invalid input(s): CK ------------------------------------------------------------------------------------------------------------------    Component Value Date/Time   BNP 424.4 (H) 06/24/2016 1604     Roxan Hockey M.D on 01/02/2018 at 4:50 PM  Between 7am to 7pm - Pager - [phone]  After 7pm go to [email or web address] - password [email or web address]  [facility] -  Office  [phone]   Voice Recognition Viviann Spare dictation system was used to create this note, attempts have been made to correct errors. Please contact the author with questions and/or clarifications.

## 2018-01-02 NOTE — Anesthesia Postprocedure Evaluation (Signed)
Anesthesia Post Note  Patient: REATHER STELLER  Procedure(s) Performed: LAPAROSCOPIC CHOLECYSTECTOMY WITH INTRAOPERATIVE CHOLANGIOGRAM (N/A Abdomen)     Patient location during evaluation: PACU Anesthesia Type: General Level of consciousness: awake and alert Pain management: pain level controlled Vital Signs Assessment: post-procedure vital signs reviewed and stable Respiratory status: spontaneous breathing, nonlabored ventilation, respiratory function stable and patient connected to nasal cannula oxygen Cardiovascular status: blood pressure returned to baseline and stable Postop Assessment: no apparent nausea or vomiting Anesthetic complications: no    Last Vitals:  Vitals:   01/02/18 1405 01/02/18 1415  BP:  (!) 117/59  Pulse: (!) 107 (!) 103  Resp:  13  Temp:  36.8 C  SpO2: 100% 100%    Last Pain:  Vitals:   01/02/18 1415  TempSrc:   PainSc: Asleep                 Jamis Kryder,W. EDMOND

## 2018-01-03 ENCOUNTER — Encounter (HOSPITAL_COMMUNITY): Payer: Self-pay | Admitting: General Surgery

## 2018-01-03 LAB — BASIC METABOLIC PANEL
ANION GAP: 7 (ref 5–15)
BUN: 12 mg/dL (ref 6–20)
CHLORIDE: 107 mmol/L (ref 101–111)
CO2: 27 mmol/L (ref 22–32)
Calcium: 8.3 mg/dL — ABNORMAL LOW (ref 8.9–10.3)
Creatinine, Ser: 0.63 mg/dL (ref 0.44–1.00)
GFR calc non Af Amer: >60 mL/min (ref >60)
Glucose, Bld: 193 mg/dL — ABNORMAL HIGH (ref 65–99)
POTASSIUM: 4.3 mmol/L (ref 3.5–5.1)
Sodium: 141 mmol/L (ref 135–145)

## 2018-01-03 LAB — CBC
HCT: 29.5 % — ABNORMAL LOW (ref 36.0–46.0)
HEMOGLOBIN: 9.6 g/dL — AB (ref 12.0–15.0)
MCH: 30.5 pg (ref 26.0–34.0)
MCHC: 32.5 g/dL (ref 30.0–36.0)
MCV: 93.7 fL (ref 78.0–100.0)
Platelets: 188 10*3/uL (ref 150–400)
RBC: 3.15 MIL/uL — AB (ref 3.87–5.11)
RDW: 13.2 % (ref 11.5–15.5)
WBC: 8.5 10*3/uL (ref 4.0–10.5)

## 2018-01-03 LAB — HEPARIN LEVEL (UNFRACTIONATED): Heparin Unfractionated: 0.43 IU/mL (ref 0.30–0.70)

## 2018-01-03 MED ORDER — HEPARIN BOLUS VIA INFUSION
2000.0000 [IU] | Freq: Once | INTRAVENOUS | Status: AC
  Administered 2018-01-03: 2000 [IU] via INTRAVENOUS
  Filled 2018-01-03: qty 2000

## 2018-01-03 MED ORDER — HEPARIN (PORCINE) IN NACL 100-0.45 UNIT/ML-% IJ SOLN
1350.0000 [IU]/h | INTRAMUSCULAR | Status: DC
  Administered 2018-01-03 – 2018-01-04 (×2): 1350 [IU]/h via INTRAVENOUS
  Filled 2018-01-03 (×3): qty 250

## 2018-01-03 MED ORDER — ACETAMINOPHEN 500 MG PO TABS
1000.0000 mg | ORAL_TABLET | Freq: Three times a day (TID) | ORAL | Status: DC
  Administered 2018-01-03 – 2018-01-04 (×3): 1000 mg via ORAL
  Filled 2018-01-03 (×3): qty 2

## 2018-01-03 NOTE — Progress Notes (Signed)
Patient Demographics:    Joyce Payne, is a 57 y.o. female, DOB - [birth year], XHB:[id number]  Admit date - 12/31/2017   Admitting Physician Md Edison Pace, MD  Outpatient Primary MD for the patient is Lawerance Cruel, MD  LOS - 3  Chief Complaint  Patient presents with  . 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  Hospitalist Service consult requested on 12/31/2017 for management of anticoagulation, patient is currently on heparin drip bridge after lap chole.  General surgery is primary team, hospitalist service is consulted for medical management of anticoagulation in a patient with history of DVT and PE     Plan:-  1)Acute cholecystitis and cholelithiasis-  overall patient feels a lot better, no emesis, abdominal pain is better, no fevers, white count is down to 8.5 from 16.1 , on 01/02/18  she underwent lap chole by Dr Excell Seltzer with findings of Severe acute and subacute cholecystitis with pericholecystic abscess. patient currently on Zosyn (antibiotic duration to be determined by surgical team)    2)H/o Bil pulmonary embolism/H/o DVT-   last dose of Eliquis was in the a.m. of 12/30/2017, continue IV heparin to bridge patient for now.  The plan previously was for  patient to be on lifelong anticoagulation, use SCDs in the meantime, restart Eliquis when okay with surgical team  3)Hodgkin's lymphoma in remission- -She is status post chemotherapy.  Currently in remission.  4)Obesity-prior gastric bypass surgery over 20 years ago  Code Status : Full  Disposition Plan  : TBD  Consults  : General surgery is primary team, hospitalist service is consulted for medical management of anticoagulation in a patient with history of DVT and PE   DVT Prophylaxis  : IV heparin/SCDs   Lab Results  Component Value Date   PLT 188 01/03/2018    Inpatient Medications  Scheduled Meds: . acetaminophen  1,000 mg Oral Q8H  . chlorhexidine  15 mL Mouth Rinse BID  . DULoxetine  90 mg Oral Daily  . fesoterodine  8 mg Oral QHS  . lisdexamfetamine  50 mg Oral Daily  . mouth rinse  15 mL Mouth Rinse q12n4p  . multivitamin with minerals  1 tablet Oral Daily  . pantoprazole (PROTONIX) IV  40 mg Intravenous QHS  . pregabalin  50 mg Oral BID  . senna  1 tablet Oral BID   Continuous Infusions: . dextrose 5 % and 0.45 % NaCl with KCl 20 mEq/L 50 mL/hr at 01/03/18 1544  . heparin 1,350 Units/hr (01/03/18 1120)  . piperacillin-tazobactam (ZOSYN)  IV 3.375 g (01/03/18 1544)   PRN Meds:.acetaminophen **OR** acetaminophen, diazepam, metoprolol tartrate, morphine injection, ondansetron **OR** ondansetron (  ZOFRAN) IV, oxyCODONE, polyethylene glycol, traMADol, traZODone    Anti-infectives (From admission, onward)   Start     Dose/Rate Route Frequency Ordered Stop   12/31/17 1400  piperacillin-tazobactam (ZOSYN) IVPB 3.375 g     3.375 g 12.5 mL/hr over 240 Minutes Intravenous Every 8 hours 12/31/17 1335     12/31/17 1215  cefTRIAXone (ROCEPHIN) 1 g in sodium chloride 0.9 % 100 mL IVPB     1 g 200 mL/hr over 30 Minutes Intravenous  Once 12/31/17 1200 12/31/17 1345   12/31/17 1200  metroNIDAZOLE (FLAGYL) IVPB 500 mg  Status:  Discontinued     500 mg 100 mL/hr over 60  Minutes Intravenous  Once 12/31/17 1200 12/31/17 1343        Objective:   Vitals:   01/02/18 1736 01/02/18 2100 01/03/18 0522 01/03/18 1413  BP: (!) 129/55 112/67 125/64 115/77  Pulse: 91 97 89 95  Resp: 16 17 19 18   Temp: 98.7 F (37.1 C) 98.9 F (37.2 C) 98.4 F (36.9 C) (!) 97.4 F (36.3 C)  TempSrc: Oral Oral Oral Oral  SpO2: 98% 97% 99% 96%  Weight:      Height:        Wt Readings from Last 3 Encounters:  12/31/17 97.1 kg (214 lb)  01/19/17 94.9 kg (209 lb 3.2 oz)  01/12/17 96 kg (211 lb 11.2 oz)     Intake/Output Summary (Last 24 hours) at 01/03/2018 1947 Last data filed at 01/03/2018 1937 Gross per 24 hour  Intake 4326.67 ml  Output 2695 ml  Net 1631.67 ml     Physical Exam  Gen:- Awake Alert, obese, very pleasant HEENT:- [place].AT, No sclera icterus Neck-Supple Neck,No JVD,.  Lungs-  CTAB , diminished in bases CV- S1, S2 normal Abd-  +ve B.Sounds, Abd Soft, appropriate postop tenderness, right upper quadrant JP drain with sero-sanguinous drainage   (90 ml/;ast 16 hrs)  Extremity/Skin:- No  edema, pedal pulses are good Psych-affect is appropriate, oriented x3 Neuro-no new focal deficits, no tremors   Data Review:   Micro Results Recent Results (from the past 240 hour(s))  Blood Culture (routine x 2)     Status: None (Preliminary result)   Collection Time: 12/31/17 10:45 AM  Result Value Ref Range Status   Specimen Description   Final    BLOOD LEFT ANTECUBITAL Performed at [facility] [address], [place], [place] [address]    Special Requests   Final    BOTTLES DRAWN AEROBIC AND ANAEROBIC Blood Culture adequate volume Performed at [facility] [address], [place], [place] [address]    Culture   Final    NO GROWTH 3 DAYS Performed at [facility], [place] [address], [place], [place] [address]    Report Status PENDING  Incomplete  Blood Culture (routine x 2)     Status: None (Preliminary result)    Collection Time: 12/31/17 10:54 AM  Result Value Ref Range Status   Specimen Description   Final    BLOOD BLOOD RIGHT FOREARM Performed at [facility] [address], [place], [place] [address]    Special Requests   Final    BOTTLES DRAWN AEROBIC AND ANAEROBIC Blood Culture adequate volume Performed at [facility] [address], [place], [place] [address]    Culture   Final    NO GROWTH 3 DAYS Performed at [facility], [place] [address] [place], [place] [address]  Report Status PENDING  Incomplete    Radiology Reports Dg Chest 2 View  Result Date: 12/31/2017 CLINICAL DATA:  Fever of 103.1.  Abdominal pain. EXAM: CHEST - 2 VIEW COMPARISON:  12/04/2016. FINDINGS: Poor inspiration. Grossly stable normal sized heart. Interval linear and patchy density in the left lower lobe and probable small left pleural effusion. Clear right lung. Multiple old, healed right rib fractures. IMPRESSION: Interval left lower lobe atelectasis and possible pneumonia with a probable small left pleural effusion. Electronically Signed   By: Claudie Revering M.D.   On: 12/31/2017 10:18   Ct Abdomen Pelvis W Contrast  Result Date: 12/31/2017 CLINICAL DATA:  Acute abdominal pain, fever, history of lymphoma now in remission, elevated white count EXAM: CT ABDOMEN AND PELVIS WITH CONTRAST TECHNIQUE: Multidetector CT imaging of the abdomen and pelvis was performed using the standard protocol following bolus administration of intravenous contrast. CONTRAST:  182mL ISOVUE-300 IOPAMIDOL (ISOVUE-300) INJECTION 61% COMPARISON:  02/02/2017 FINDINGS: Lower chest: Bandlike bibasilar atelectasis, worse on the left. No significant pleural effusion. Normal heart size. No pericardial effusion. Hepatobiliary: Small left hepatic dome hypodense cysts, unchanged. Mild central biliary prominence. No other focal hepatic abnormality. Hepatic and portal veins remain patent. Gallbladder is distended  with mucosal enhancement mild surrounding inflammatory changes, and trace pericholecystic fluid. Appearance compatible with acute cholecystitis. Adjacent inflammatory reactive changes noted along the duodenum, pancreatic head, and adjacent colon. No fluid collection or abscess. Pancreas: No focal abnormality or ductal dilatation. Spleen: Normal in size without focal abnormality. Adrenals/Urinary Tract: Adrenal glands are unremarkable. Kidneys are normal, without renal calculi, focal lesion, or hydronephrosis. Bladder is unremarkable. Stomach/Bowel: postop changes from gastric bypass. Reactive inflammatory changes around duodenum in the right upper quadrant to cholecystitis. Negative for bowel obstruction, significant dilatation, ileus, free air. Appendix is unremarkable. Vascular/Lymphatic: Aortic atherosclerosis. Negative for aneurysm. Adenopathy. Reproductive: Midline pelvic complex partially cystic mass measuring 7 x 9 cm, image 67 series 2. Consider follow-up evaluation with transvaginal pelvic ultrasound further characterization. 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 Other: No inguinal or abdominal wall hernia. No significant ascites. Musculoskeletal: Lower lumbar degenerative changes noted. No acute compression fracture. IMPRESSION: Distended gallbladder with mucosal enhancement surrounding inflammatory changes, and trace pericholecystic fluid compatible with acute cholecystitis. These results were called by telephone at the time of interpretation on 12/31/2017 at 12:06 pm to Dr. Aletta Edouard , who verbally acknowledged these results. No associated biliary obstruction or dilatation. 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 This recommendation follows  ACR consensus guidelines: White Paper of the ACR Incidental Findings Committee II on Adnexal Findings. J Am Coll Radiol [phone]. Electronically Signed   By: Jerilynn Mages.  Shick M.D.   On: 12/31/2017 12:07   US Abdomen Limited  Result Date: 12/31/2017 CLINICAL DATA:  Cholecystitis by CT EXAM: ULTRASOUND ABDOMEN LIMITED RIGHT UPPER QUADRANT COMPARISON:  12/31/2017 FINDINGS: Gallbladder: Moderate gallbladder distention with mild wall thickening measuring 3.3 mm. Trace pericholecystic fluid. Murphys sign is elicited over the gallbladder during the exam. Gallbladder sludge noted. Findings compatible with cholecystitis. Common bile duct: Diameter: 6 mm. Liver: No focal abnormality. Normal echogenicity. Mild diffuse intrahepatic biliary dilatation. Portal vein is patent on color Doppler imaging with normal direction of blood flow towards the liver. IMPRESSION: Distended gallbladder with mild wall thickening, ultrasound findings suggesting acute cholecystitis as above. Mild intrahepatic  biliary dilatation Electronically Signed   By: Jerilynn Mages.  Shick M.D.   On: 12/31/2017 14:17     CBC Recent Labs  Lab 12/31/17 1045 01/01/18 0300 01/02/18 0412 01/03/18 0444  WBC 16.1* 14.2* 11.8* 8.5  HGB 13.4 11.5* 11.5* 9.6*  HCT 39.6 35.1* 34.7* 29.5*  PLT 216 157 134* 188  MCV 93.8 94.4 92.5 93.7  MCH 31.8 30.9 30.7 30.5  MCHC 33.8 32.8 33.1 32.5  RDW 12.8 12.9 13.1 13.2  LYMPHSABS 1.0  --   --   --   MONOABS 1.6*  --   --   --   EOSABS 0.0  --   --   --   BASOSABS 0.0  --   --   --     Chemistries  Recent Labs  Lab 12/31/17 1045 01/01/18 0300 01/01/18 0958 01/03/18 0444  NA 135 139 136 141  K 4.2 4.1 3.6 4.3  CL 101 106 103 107  CO2 25 25 24 27   GLUCOSE 146* 169* 124* 193*  BUN 21* 19 19 12   CREATININE 0.67 0.68 0.63 0.63  CALCIUM 8.6* 8.2* 8.1* 8.3*  AST 24 23 26   --   ALT 27 24 23   --   ALKPHOS 107 93 111  --   BILITOT 1.0 0.8 1.0  --     ------------------------------------------------------------------------------------------------------------------ No results for input(s): CHOL, HDL, LDLCALC, TRIG, CHOLHDL, LDLDIRECT in the last 72 hours.  No results found for: HGBA1C ------------------------------------------------------------------------------------------------------------------ No results for input(s): TSH, T4TOTAL, T3FREE, THYROIDAB in the last 72 hours.  Invalid input(s): FREET3 ------------------------------------------------------------------------------------------------------------------ No results for input(s): VITAMINB12, FOLATE, FERRITIN, TIBC, IRON, RETICCTPCT in the last 72 hours.  Coagulation profile No results for input(s): INR, PROTIME in the last 168 hours.  No results for input(s): DDIMER in the last 72 hours.  Cardiac Enzymes No results for input(s): CKMB, TROPONINI, MYOGLOBIN in the last 168 hours.  Invalid input(s): CK ------------------------------------------------------------------------------------------------------------------    Component Value Date/Time   BNP 424.4 (H) 06/24/2016 1604   Roxan Hockey M.D on 01/03/2018 at 7:47 PM  Between 7am to 7pm - Pager - [phone]  After 7pm go to [email or web address] - password [email or web address]  [facility] -  Office  [phone]   Voice Recognition Viviann Spare dictation system was used to create this note, attempts have been made to correct errors. Please contact the author with questions and/or clarifications.

## 2018-01-03 NOTE — Progress Notes (Signed)
[facility] for IV heparin Indication: Hx VTE on Eliquis; perioperative bridging  Allergies  Allergen Reactions  . Tape Other (See Comments)    TEARS THE SKIN (SKIN IS THIN!!)  . Latex Rash  . Povidone-Iodine Rash    Patient Measurements: Height: 5\' 6"  (167.6 cm) Weight: 214 lb (97.1 kg) IBW/kg (Calculated) : 59.3 Heparin Dosing Weight: 81  Vital Signs: Temp: 98.4 F (36.9 C) (03/25 0522) Temp Source: Oral (03/25 0522) BP: 125/64 (03/25 0522) Pulse Rate: 89 (03/25 0522)  Labs: Recent Labs    01/01/18 0300 01/01/18 0958 01/02/18 0412 01/03/18 0444  HGB 11.5*  --  11.5* 9.6*  HCT 35.1*  --  34.7* 29.5*  PLT 157  --  134* 188  APTT 83* 82*  --   --   HEPARINUNFRC 1.80*  --   --   --   CREATININE 0.68 0.63  --  0.63    Estimated Creatinine Clearance: 92.2 mL/min (by C-G formula based on SCr of 0.63 mg/dL).   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 Pharmacy consulted to dose heparin while off Eliquis perioperatively. Tentative plan is for Lap choley 48 hrs after last Eliquis if conservative management fails.   Baseline INR, aPTT: not done  Prior anticoagulation: Eliquis 5 mg bid, LD 3/22 at 0500   Today, 01/03/18  Restarting IV heparin today  Hgb 9.6 POD1. Plts 188 improved  No reported bleeding  Goal of Therapy: Heparin level 0.3-0.7 units/ml Monitor platelets by anticoagulation protocol: Yes Aptt=66-102 sec  Plan: 1) IV heparin 2000 unit bolus then  2) IV heparin rate of 1350 units/hr 3) Check heparin level 6 hours after start of IV heparin 4) Daily heparin level and CBC 5) Noted plan to restart patient's home apixaban likely tomorrow pending CCS evaluation  Adrian Saran, PharmD, BCPS Pager [phone] 01/03/2018 9:27 AM

## 2018-01-03 NOTE — Progress Notes (Signed)
Brief Pharmacy Note: Heparin  O:  Heparin level: 0.43 on 1350 units/hr (goal 0.3-0.7)       No bleeding or infusion issues reported by RN  A/P Continue current rate        Daily  Heparin level & CBC  Netta Cedars, PharmD, BCPS 01/03/2018@5 :41 PM

## 2018-01-03 NOTE — Progress Notes (Signed)
[facility] Progress Note  1 Day Post-Op  Subjective: CC:  Reports a lot of abdominal soreness and trouble sleeping. Abdominal pain is overall improved compared to pre-operatively - endorses soreness with deep breathing. Denies green drainage in JP. Tolerating clears. Denies nausea, vomiting , or BM. Has not mobilized since surgery.   Has a lot of anxiety about medication regimen and neck/hip pain present prior to hospital admission.  Objective: Vital signs in last 24 hours: Temp:  [98 F (36.7 C)-98.9 F (37.2 C)] 98.4 F (36.9 C) (03/25 0522) Pulse Rate:  [89-111] 89 (03/25 0522) Resp:  [12-24] 19 (03/25 0522) BP: (112-129)/(47-67) 125/64 (03/25 0522) SpO2:  [82 %-100 %] 99 % (03/25 0522) Last BM Date: 12/30/17  Intake/Output from previous day: 03/24 0701 - 03/25 0700 In: 4760 [P.O.:660; I.V.:3900; IV Piggyback:200] Out: 2300 [Urine:2200; Drains:90; Blood:10] Intake/Output this shift: No intake/output data recorded.  PE: Gen:  Alert, anxious, NAD Card:  Regular rate and rhythm Pulm:  Normal effort Abd: Soft, appropriately tender, icisions c/d/i, JP w/ small amount sanguinous drainage (90cc/24h) Skin: warm and dry, no rashes  Psych: A&Ox3   Lab Results:  Recent Labs    01/02/18 0412 01/03/18 0444  WBC 11.8* 8.5  HGB 11.5* 9.6*  HCT 34.7* 29.5*  PLT 134* 188   BMET Recent Labs    01/01/18 0958 01/03/18 0444  NA 136 141  K 3.6 4.3  CL 103 107  CO2 24 27  GLUCOSE 124* 193*  BUN 19 12  CREATININE 0.63 0.63  CALCIUM 8.1* 8.3*   PT/INR No results for input(s): LABPROT, INR in the last 72 hours. CMP     Component Value Date/Time   NA 141 01/03/2018 0444   NA 145 01/11/2017 1114   K 4.3 01/03/2018 0444   K 4.7 01/11/2017 1114   CL 107 01/03/2018 0444   CL 108 (H) 02/06/2013 1022   CO2 27 01/03/2018 0444   CO2 28 01/11/2017 1114   GLUCOSE 193 (H) 01/03/2018 0444   GLUCOSE 94 01/11/2017 1114   GLUCOSE 108 (H) 02/06/2013 1022   BUN 12  01/03/2018 0444   BUN 14.4 01/11/2017 1114   CREATININE 0.63 01/03/2018 0444   CREATININE 0.8 01/11/2017 1114   CALCIUM 8.3 (L) 01/03/2018 0444   CALCIUM 9.6 01/11/2017 1114   PROT 5.3 (L) 01/01/2018 0958   PROT 6.3 (L) 05/23/2015 1432   ALBUMIN 2.5 (L) 01/01/2018 0958   ALBUMIN 3.8 05/23/2015 1432   AST 26 01/01/2018 0958   AST 15 05/23/2015 1432   ALT 23 01/01/2018 0958   ALT 20 05/23/2015 1432   ALKPHOS 111 01/01/2018 0958   ALKPHOS 90 05/23/2015 1432   BILITOT 1.0 01/01/2018 0958   BILITOT 0.30 05/23/2015 1432   GFRNONAA >60 01/03/2018 0444   GFRAA >60 01/03/2018 0444   Lipase     Component Value Date/Time   LIPASE 25 12/31/2017 1045       Studies/Results: No results found.  Anti-infectives: Anti-infectives (From admission, onward)   Start     Dose/Rate Route Frequency Ordered Stop   12/31/17 1400  piperacillin-tazobactam (ZOSYN) IVPB 3.375 g     3.375 g 12.5 mL/hr over 240 Minutes Intravenous Every 8 hours 12/31/17 1335     12/31/17 1215  cefTRIAXone (ROCEPHIN) 1 g in sodium chloride 0.9 % 100 mL IVPB     1 g 200 mL/hr over 30 Minutes Intravenous  Once 12/31/17 1200 12/31/17 1345   12/31/17 1200  metroNIDAZOLE (FLAGYL) IVPB 500  mg  Status:  Discontinued     500 mg 100 mL/hr over 60 Minutes Intravenous  Once 12/31/17 1200 12/31/17 1343       Assessment/Plan Hodgkins lymphoma in remission  H/o bilateral PE - Eliquis held for surgery, plan to resume post-operatively Anxiety/depression Obesity - s/p gastric bypass > 20 years ago   Acute calculous cholecystitis with gallbladder empyema  POD#1 s/p laparoscopic cholecystectomy by Dr. Excell Seltzer - tolerating clears, advance to full liquids  - continue IV abx today, transition to orals tomorrow - follow JP output, can likely remove prior to discharge. - pain: scheduled tylenol, 100 mg ULTRAM q6h PRN (pt takes ULTRAM at home), oxycodone PRN, IV for breakthrough - mobilize/IS!!  FEN: full liquids VTE: heparin  gtt per pharmacy, CBC in AM  ID: IV Zosyn 3/22>> Foley: none    LOS: 3 days    Jill Alexanders , [facility] Surgery 01/03/2018, 10:17 AM Pager: [phone] Consults: [phone] Mon-Fri 7:00 am-4:30 pm Sat-Sun 7:00 am-11:30 am

## 2018-01-04 DIAGNOSIS — I2782 Chronic pulmonary embolism: Secondary | ICD-10-CM

## 2018-01-04 LAB — CBC
HCT: 30.1 % — ABNORMAL LOW (ref 36.0–46.0)
Hemoglobin: 9.9 g/dL — ABNORMAL LOW (ref 12.0–15.0)
MCH: 30.7 pg (ref 26.0–34.0)
MCHC: 32.9 g/dL (ref 30.0–36.0)
MCV: 93.2 fL (ref 78.0–100.0)
PLATELETS: 198 10*3/uL (ref 150–400)
RBC: 3.23 MIL/uL — AB (ref 3.87–5.11)
RDW: 13.3 % (ref 11.5–15.5)
WBC: 9.5 10*3/uL (ref 4.0–10.5)

## 2018-01-04 LAB — HEPARIN LEVEL (UNFRACTIONATED): HEPARIN UNFRACTIONATED: 0.35 [IU]/mL (ref 0.30–0.70)

## 2018-01-04 MED ORDER — OXYCODONE HCL 5 MG PO TABS: 10.0000 mg | ORAL_TABLET | ORAL | 0 refills | Status: DC | PRN

## 2018-01-04 MED ORDER — METRONIDAZOLE 500 MG PO TABS: 500.0000 mg | ORAL_TABLET | Freq: Two times a day (BID) | ORAL | 0 refills | Status: AC

## 2018-01-04 MED ORDER — CIPROFLOXACIN HCL 500 MG PO TABS: 500.0000 mg | ORAL_TABLET | Freq: Two times a day (BID) | ORAL | 0 refills | Status: AC

## 2018-01-04 MED ORDER — POLYETHYLENE GLYCOL 3350 17 G PO PACK: 17.0000 g | PACK | Freq: Every day | ORAL | 0 refills | Status: DC | PRN

## 2018-01-04 MED ORDER — APIXABAN 5 MG PO TABS
5.0000 mg | ORAL_TABLET | Freq: Two times a day (BID) | ORAL | Status: DC
  Administered 2018-01-04: 5 mg via ORAL
  Filled 2018-01-04: qty 1

## 2018-01-04 NOTE — Progress Notes (Addendum)
[facility] for IV heparin - to change back to apixaban 3/26 AM Indication: Hx VTE on Eliquis; perioperative bridging  Allergies  Allergen Reactions  . Tape Other (See Comments)    TEARS THE SKIN (SKIN IS THIN!!)  . Latex Rash  . Povidone-Iodine Rash    Patient Measurements: Height: 5\' 6"  (167.6 cm) Weight: 214 lb (97.1 kg) IBW/kg (Calculated) : 59.3 Heparin Dosing Weight: 81  Vital Signs: Temp: 98.2 F (36.8 C) (03/26 0555) Temp Source: Oral (03/26 0555) BP: 130/80 (03/26 0555) Pulse Rate: 78 (03/26 0555)  Labs: Recent Labs    01/01/18 0958  01/02/18 0412 01/03/18 0444 01/03/18 1711 01/04/18 0530  HGB  --    < > 11.5* 9.6*  --  9.9*  HCT  --   --  34.7* 29.5*  --  30.1*  PLT  --   --  134* 188  --  198  APTT 82*  --   --   --   --   --   HEPARINUNFRC  --   --   --   --  0.43 0.35  CREATININE 0.63  --   --  0.63  --   --    < > = values in this interval not displayed.    Estimated Creatinine Clearance: 92.2 mL/min (by C-G formula based on SCr of 0.63 mg/dL).   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 Pharmacy consulted to dose heparin while off Eliquis perioperatively. Tentative plan is for Lap choley 48 hrs after last Eliquis if conservative management fails.   Baseline INR, aPTT: not done  Prior anticoagulation: Eliquis 5 mg bid, LD 3/22 at 0500  Today, 01/04/18  Heparin level continues to be therapeutic on current IV heparin rate of 1350 units/hr  CBC stable  No reported bleeding  To transition from IV heparin and restart apixaban today  Goal of Therapy: Heparin level 0.3-0.7 units/ml Monitor platelets by anticoagulation protocol: Yes Aptt=66-102 sec  Plan: 1) Discontinue IV heparin and start apixaban 5mg  PO BID - note that when stop heparin go ahead and give apixaban and do not wait 2) Monitor labs  and for bleeding  Adrian Saran, PharmD, BCPS Pager [phone] 01/04/2018 7:47 AM

## 2018-01-04 NOTE — Discharge Instructions (Signed)
Please arrive at least 30 min before your appointment to complete your check in paperwork.  If you are unable to arrive 30 min prior to your appointment time we may have to cancel or reschedule you. ° °LAPAROSCOPIC SURGERY: POST OP INSTRUCTIONS  °1. DIET: Follow a light bland diet the first 24 hours after arrival home, such as soup, liquids, crackers, etc. Be sure to include lots of fluids daily. Avoid fast food or heavy meals as your are more likely to get nauseated. Eat a low fat the next few days after surgery.  °2. Take your usually prescribed home medications unless otherwise directed. °3. PAIN CONTROL:  °1. Pain is best controlled by a usual combination of three different methods TOGETHER:  °1. Ice/Heat °2. Over the counter pain medication °3. Prescription pain medication °2. Most patients will experience some swelling and bruising around the incisions. Ice packs or heating pads (30-60 minutes up to 6 times a day) will help. Use ice for the first few days to help decrease swelling and bruising, then switch to heat to help relax tight/sore spots and speed recovery. Some people prefer to use ice alone, heat alone, alternating between ice & heat. Experiment to what works for you. Swelling and bruising can take several weeks to resolve.  °3. It is helpful to take an over-the-counter pain medication regularly for the first few weeks. Choose one of the following that works best for you:  °1. Naproxen (Aleve, etc) Two 220mg tabs twice a day °2. Ibuprofen (Advil, etc) Three 200mg tabs four times a day (every meal & bedtime) °3. Acetaminophen (Tylenol, etc) 500-650mg four times a day (every meal & bedtime) °4. A prescription for pain medication (such as oxycodone, hydrocodone, etc) should be given to you upon discharge. Take your pain medication as prescribed.  °1. If you are having problems/concerns with the prescription medicine (does not control pain, nausea, vomiting, rash, itching, etc), please call us [phone] to see if we need to switch you to a different pain medicine that will work better for you and/or control your side effect better. °2. If you need a refill on your pain medication, please contact your pharmacy. They will contact our office to request authorization. Prescriptions will not be filled after 5 pm or on week-ends. °4. Avoid getting constipated. Between the surgery and the pain medications, it is common to experience some constipation. Increasing fluid intake and taking a fiber supplement (such as Metamucil, Citrucel, FiberCon, MiraLax, etc) 1-2 times a day regularly will usually help prevent this problem from occurring. A mild laxative (prune juice, Milk of Magnesia, MiraLax, etc) should be taken according to package directions if there are no bowel movements after 48 hours.  °5. Watch out for diarrhea. If you have many loose bowel movements, simplify your diet to bland foods & liquids for a few days. Stop any stool softeners and decrease your fiber supplement. Switching to mild anti-diarrheal medications (Kayopectate, Pepto Bismol) can help. If this worsens or does not improve, please call us. °6. Wash / shower every day. You may shower over the dressings as they are waterproof. Continue to shower over incision(s) after the dressing is off. °7. Remove your waterproof bandages 5 days after surgery. You may leave the incision open to air. You may replace a dressing/Band-Aid to cover the incision for comfort if you wish.  °8. ACTIVITIES as tolerated:  °1. You may resume regular (light) daily activities beginning the next day--such as daily self-care, walking, climbing stairs--gradually   increasing activities as tolerated. If you can walk 30 minutes without difficulty, it is safe to try more intense activity such as jogging, treadmill, bicycling, low-impact aerobics, swimming, etc. 2. Save the most intensive and strenuous activity for last such as sit-ups, heavy lifting, contact sports, etc Refrain  from any heavy lifting or straining until you are off narcotics for pain control.  3. DO NOT PUSH THROUGH PAIN. Let pain be your guide: If it hurts to do something, don't do it. Pain is your body warning you to avoid that activity for another week until the pain goes down. 4. You may drive when you are no longer taking prescription pain medication, you can comfortably wear a seatbelt, and you can safely maneuver your car and apply brakes. 5. You may have sexual intercourse when it is comfortable.  9. FOLLOW UP in our office  1. Please call [facility] at [phone] to set up an appointment to see your surgeon in the office for a follow-up appointment approximately 2-3 weeks after your surgery. 2. Make sure that you call for this appointment the day you arrive home to insure a convenient appointment time.      10. IF YOU HAVE DISABILITY OR FAMILY LEAVE FORMS, BRING THEM TO THE               OFFICE FOR PROCESSING.   WHEN TO CALL us [phone]:  1. Poor pain control 2. Reactions / problems with new medications (rash/itching, nausea, etc)  3. Fever over 101.5 F (38.5 C) 4. Inability to urinate 5. Nausea and/or vomiting 6. Worsening swelling or bruising 7. Continued bleeding from incision. 8. Increased pain, redness, or drainage from the incision  The clinic staff is available to answer your questions during regular business hours (8:30am-5pm). Please dont hesitate to call and ask to speak to one of our nurses for clinical concerns.  If you have a medical emergency, go to the nearest emergency room or call 911.  A surgeon from [facility] is always on call at the [facility], [place], [place], [place], [place] [address] ?  MAIN: [phone] ? TOLL FREE: [phone] ?  FAX [phone]  [email or web address]  Low-Fat Diet for Pancreatitis or Gallbladder Conditions . What do I need to know about this diet?  Eat a  low-fat diet. ? Reduce your fat intake to less than 20-30% of your total daily calories. This is less than 50-60 g of fat per day. ? Remember that you need some fat in your diet. Ask your dietician what your daily goal should be. ? Choose nonfat and low-fat healthy foods. Look for the words nonfat, low fat, or fat free. ? As a guide, look on the label and choose foods with less than 3 g of fat per serving. Eat only one serving.  Avoid alcohol.  Do not smoke. If you need help quitting, talk with your health care provider.  Eat small frequent meals instead of three large heavy meals. What foods can I eat? Grains Include healthy grains and starches such as potatoes, wheat bread, fiber-rich cereal, and brown rice. Choose whole grain options whenever possible. In adults, whole grains should account for 45-65% of your daily calories. Fruits and Vegetables Eat plenty of fruits and vegetables. Fresh fruits and vegetables add fiber to your diet. Meats and Other Protein Sources Eat lean meat such as chicken and pork. Trim any fat off of meat before cooking  it. Eggs, fish, and beans are other sources of protein. In adults, these foods should account for 10-35% of your daily calories. Dairy Choose low-fat milk and dairy options. Dairy includes fat and protein, as well as calcium. Fats and Oils Limit high-fat foods such as fried foods, sweets, baked goods, sugary drinks. Other Creamy sauces and condiments, such as mayonnaise, can add extra fat. Think about whether or not you need to use them, or use smaller amounts or low fat options. What foods are not recommended?  High fat foods, such as: ? [employer]. ? Ice cream. ? [place] toast. ? Sweet rolls. ? Pizza. ? Cheese bread. ? Foods covered with batter, butter, creamy sauces, or cheese. ? Fried foods. ? Sugary drinks and desserts.  Foods that cause gas or bloating This information is not intended to replace advice given to you by your  health care provider. Make sure you discuss any questions you have with your health care provider. Document Released: 10/03/2013 Document Revised: 03/05/2016 Document Reviewed: 09/11/2013 Elsevier Interactive Patient Education  2017 [employer].

## 2018-01-04 NOTE — Progress Notes (Signed)
Pt alert and oriented, tolerating diet.  D/C instructions and prescriptions were given, all questions were answered.  Pt was d/cd home with spouse.

## 2018-01-04 NOTE — Consult Note (Signed)
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  Hospitalist Service consult requested on 12/31/2017 for management of anticoagulation, patient is currently on heparin drip bridge after lap chole.  General surgery is primary team, hospitalist service is consulted for medical management of anticoagulation in a patient with history of DVT and PE  Acute cholecystitis and cholelithiasis-  overall patient feels a lot better, no emesis, abdominal pain is better, no fevers, white count is down to 8.5 from 16.1 , on 01/02/18  she underwent lap chole by Dr Excell Seltzer with findings of Severe acute and subacute cholecystitis with pericholecystic abscess. Cont per surgery   H/o Bil pulmonary embolism/H/o DVT-   last dose of Eliquis was in the a.m. of 12/30/2017, continued IV heparin to bridge patient.  The plan previously was for patient to be on lifelong anticoagulation, use SCDs in the meantime, restart Eliquis when okay with surgical team  Hodgkin's lymphoma in remission- -She is status post chemotherapy. Currently in remission.     Code Status: full Family Communication: d/w patient, her family. Surgery  (indicate person spoken with, relationship, and if by phone, the number) Disposition Plan: home per surgery    Consultants:  Surgery    Procedures:  surgery   Antibiotics: Anti-infectives (From admission, onward)   Start     Dose/Rate Route  Frequency Ordered Stop   01/04/18 0000  ciprofloxacin (CIPRO) 500 MG tablet     500 mg Oral 2 times daily 01/04/18 1052 01/09/18 2359   01/04/18 0000  metroNIDAZOLE (FLAGYL) 500 MG tablet     500 mg Oral 2 times daily with meals 01/04/18 1052 01/09/18 2359   12/31/17 1400  piperacillin-tazobactam (ZOSYN) IVPB 3.375 g     3.375 g 12.5 mL/hr over 240 Minutes Intravenous Every 8 hours 12/31/17 1335     12/31/17 1215  cefTRIAXone (ROCEPHIN) 1 g in sodium chloride 0.9 % 100 mL IVPB     1 g 200 mL/hr over 30 Minutes Intravenous  Once 12/31/17 1200 12/31/17 1345   12/31/17 1200  metroNIDAZOLE (FLAGYL) IVPB 500 mg  Status:  Discontinued     500 mg 100 mL/hr over 60 Minutes Intravenous  Once 12/31/17 1200 12/31/17 1343        (indicate start date, and stop date if known)  HPI/Subjective: Alert. Reports feeling well. No acute dyspnea   Objective: Vitals:   01/04/18 0555 01/04/18 1003  BP: 130/80 137/90  Pulse: 78 87  Resp: 18 18  Temp: 98.2 F (36.8 C) 98.8 F (37.1 C)  SpO2: 96% 97%    Intake/Output Summary (Last 24 hours) at 01/04/2018 1122 Last data filed at 01/04/2018 0906 Gross per 24 hour  Intake 2762 ml  Output 2005 ml  Net 757 ml   Filed Weights   12/31/17 0848  Weight: 97.1 kg (214 lb)    Exam:   General:  No distress   Cardiovascular: s1,s2 rrr  Respiratory: CTA BL  Data Reviewed: Basic Metabolic Panel: Recent Labs  Lab 12/31/17 1045 01/01/18 0300 01/01/18 0958 01/03/18 0444  NA 135 139 136  141  K 4.2 4.1 3.6 4.3  CL 101 106 103 107  CO2 25 25 24 27   GLUCOSE 146* 169* 124* 193*  BUN 21* 19 19 12   CREATININE 0.67 0.68 0.63 0.63  CALCIUM 8.6* 8.2* 8.1* 8.3*   Liver Function Tests: Recent Labs  Lab 12/31/17 1045 01/01/18 0300 01/01/18 0958  AST 24 23 26   ALT 27 24 23   ALKPHOS 107 93 111  BILITOT 1.0 0.8 1.0  PROT 6.3* 5.4* 5.3*  ALBUMIN 3.5 1.0* 2.5*   Recent Labs  Lab 12/31/17 1045  LIPASE 25   No results for input(s): AMMONIA in  the last 168 hours. CBC: Recent Labs  Lab 12/31/17 1045 01/01/18 0300 01/02/18 0412 01/03/18 0444 01/04/18 0530  WBC 16.1* 14.2* 11.8* 8.5 9.5  NEUTROABS 13.5*  --   --   --   --   HGB 13.4 11.5* 11.5* 9.6* 9.9*  HCT 39.6 35.1* 34.7* 29.5* 30.1*  MCV 93.8 94.4 92.5 93.7 93.2  PLT 216 157 134* 188 198   Cardiac Enzymes: No results for input(s): CKTOTAL, CKMB, CKMBINDEX, TROPONINI in the last 168 hours. BNP (last 3 results) No results for input(s): BNP in the last 8760 hours.  ProBNP (last 3 results) No results for input(s): PROBNP in the last 8760 hours.  CBG: No results for input(s): GLUCAP in the last 168 hours.  Recent Results (from the past 240 hour(s))  Blood Culture (routine x 2)     Status: None (Preliminary result)   Collection Time: 12/31/17 10:45 AM  Result Value Ref Range Status   Specimen Description   Final    BLOOD LEFT ANTECUBITAL Performed at [facility] [address], [place], [place] [address]    Special Requests   Final    BOTTLES DRAWN AEROBIC AND ANAEROBIC Blood Culture adequate volume Performed at [facility] [address] [place], [place] [address]    Culture   Final    NO GROWTH 4 DAYS Performed at [facility], [place] [address], [place], [place] [address]    Report Status PENDING  Incomplete  Blood Culture (routine x 2)     Status: None (Preliminary result)   Collection Time: 12/31/17 10:54 AM  Result Value Ref Range Status   Specimen Description   Final    BLOOD BLOOD RIGHT FOREARM Performed at [facility] [address] [place], [place] [address]    Special Requests   Final    BOTTLES DRAWN AEROBIC AND ANAEROBIC Blood Culture adequate volume Performed at [facility] [address], [place], [place] [address]    Culture   Final    NO GROWTH 4 DAYS Performed at [facility], [place] [address], [place], [place] [address]    Report Status  PENDING  Incomplete     Studies: No results found.  Scheduled Meds: . acetaminophen  1,000 mg Oral Q8H  . apixaban  5 mg Oral BID  . chlorhexidine  15 mL Mouth Rinse BID  . DULoxetine  90 mg Oral Daily  . fesoterodine  8 mg Oral QHS  . lisdexamfetamine  50 mg Oral Daily  . mouth rinse  15 mL Mouth Rinse q12n4p  . multivitamin with minerals  1 tablet Oral Daily  . pantoprazole (PROTONIX) IV  40 mg Intravenous QHS  . pregabalin  50 mg Oral BID  . senna  1 tablet Oral BID   Continuous Infusions: . dextrose 5 %  and 0.45 % NaCl with KCl 20 mEq/L 50 mL/hr at 01/03/18 1544  . piperacillin-tazobactam (ZOSYN)  IV Stopped (01/04/18 5361)    Active Problems:   Acute cholecystitis    Time spent: >25 minutes     Kinnie Feil  [facility] Pager [phone]. If 7PM-7AM, please contact night-coverage at [email or web address], password [facility] 01/04/2018, 11:22 AM  LOS: 4 days

## 2018-01-04 NOTE — Discharge Summary (Addendum)
[facility] Discharge Summary   Patient ID: Joyce Payne MRN: [id number] DOB/AGE: May 26, 1961 57 y.o.  Admit date: 12/31/2017 Discharge date: 01/04/2018  Discharge Diagnosis Patient Active Problem List   Diagnosis Date Noted  . Acute cholecystitis 12/31/2017  . Lethargy 09/27/2016  . Fatigue 08/10/2016  . Demand ischemia ([place])   . Acute saddle pulmonary embolism without acute cor pulmonale ([place]) 06/24/2016  . Iron deficiency 10/20/2012  . Routine general medical examination at a health care facility 07/26/2012  . Other screening mammogram 07/26/2012  . OAB (overactive bladder) 04/28/2012  . Depression with anxiety 02/16/2012  . History of lymphoma 02/15/2012  . H/O gastric bypass 02/15/2012  . Allergic rhinitis, cause unspecified 01/14/2012  . ANXIETY 11/11/2010  . DEPRESSION 11/11/2010  . Other chronic pain 11/11/2010  . GERD 11/11/2010  . 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 LOW BACK PAIN 11/11/2010    Consultants [facility]  Imaging: No results found.  Procedures Dr. Marland Kitchen [place] (01/04/18) -laparoscopic cholecystectomy  Hospital Course:  57 year old female with a past medical history of Hodgkin's lymphoma in remission, recurrent DVT on Eliquis, GERD, history of gastric bypass who presented to [facility] emergency department with 2 days of abdominal pain.  Associated symptoms include fever and malaise.  Workup showed leukocytosis of 16,000 and ultrasound and CT consistent with acute cholecystitis.  The patient's Eliquis was held and she was admitted to the hospital and started on IV antibiotics.  On 01/04/18 she underwent the procedure above without complications.  Intraoperatively she was found to have a gallbladder empyema and a surgical drain was placed in the right upper quadrant.  Postoperatively the patient was continued on IV antibiotics due to empyema.  On postoperative day #1 the patient was hemodynamically stable and she was started on a heparin  drip.  On postop day #2 she was transitioned back to her Eliquis. Diet was advanced as tolerated.  On POD#2, the patient was voiding well, tolerating diet, ambulating well, pain well controlled, vital signs stable, incisions c/d/i and felt stable for discharge home.  Patient will follow-up in our office as below for drain removal and postoperative follow-up.  She knows to call with questions or concerns.  Allergies as of 01/04/2018      Reactions   Tape Other (See Comments)   TEARS THE SKIN (SKIN IS THIN!!)   Latex Rash   Povidone-iodine Rash      Medication List    TAKE these medications   acetaminophen 500 MG tablet Commonly known as:  TYLENOL Take 1,000 mg by mouth every 6 (six) hours as needed (pain).   apixaban 5 MG Tabs tablet Commonly known as:  ELIQUIS Take 1 tablet (5 mg total) by mouth 2 (two) times daily.   azelastine 0.05 % ophthalmic solution Commonly known as:  OPTIVAR Place 1 drop into both eyes daily as needed.   celecoxib 100 MG capsule Commonly known as:  CELEBREX Take 100 mg by mouth 2 (two) times daily.   ciprofloxacin 500 MG tablet Commonly known as:  CIPRO Take 1 tablet (500 mg total) by mouth 2 (two) times daily for 5 days.   diazepam 5 MG tablet Commonly known as:  VALIUM Take 0.5 tablets (2.5 mg total) by mouth every 12 (twelve) hours as needed for anxiety. What changed:    how much to take  when to take this   DULoxetine 30 MG capsule Commonly known as:  CYMBALTA Take 90 mg by mouth daily.   DYMISTA 137-50 MCG/ACT  Susp Generic drug:  Azelastine-Fluticasone SHAKE LQ AND U 1 SPR IEN BID PRN Allergies   lisdexamfetamine 50 MG capsule Commonly known as:  VYVANSE Take 50 mg by mouth daily.   metroNIDAZOLE 500 MG tablet Commonly known as:  FLAGYL Take 1 tablet (500 mg total) by mouth 2 (two) times daily with a meal for 5 days. DO NOT CONSUME ALCOHOL WHILE TAKING THIS MEDICATION.   multivitamin capsule Take 1 capsule by mouth daily.    Omeprazole-Sodium Bicarbonate 20-1100 MG Caps capsule Commonly known as:  ZEGERID Take 1 capsule by mouth daily as needed (acid reflux).   oxyCODONE 5 MG immediate release tablet Commonly known as:  Oxy IR/ROXICODONE Take 2 tablets (10 mg total) by mouth every 4 (four) hours as needed for moderate pain (if insufficient response to tramadol).   polyethylene glycol packet Commonly known as:  MIRALAX / GLYCOLAX Take 17 g by mouth daily as needed for mild constipation.   pregabalin 50 MG capsule Commonly known as:  LYRICA Take 50 mg by mouth 2 (two) times daily.   TOVIAZ 8 MG Tb24 tablet Generic drug:  fesoterodine Take 8 mg by mouth at bedtime.   traMADol 50 MG tablet Commonly known as:  ULTRAM Take 100 mg by mouth every 6 (six) hours as needed for pain.   traZODone 100 MG tablet Commonly known as:  DESYREL Take 125 mg by mouth at bedtime as needed for sleep.   TURMERIC CURCUMIN PO Take 1 tablet by mouth daily.   VITAMIN C PO Take 1 tablet by mouth daily.   VITAMIN D PO Take 1 tablet by mouth daily.       Follow-up [facility], [place]. Go on 01/07/2018.   Specialty:  General Surgery Why:  at 10:00 AM for a nurse visit for drain removal. please arrive 30 minutes early to get checked in and fill out any paperwork. Contact information: [address] [place] [phone]          Signed: Obie Dredge, [facility] 01/04/2018, 1:44 PM Pager: [phone] Consults: [phone] Mon-Fri 7:00 am-4:30 pm Sat-Sun 7:00 am-11:30 am

## 2018-01-04 NOTE — Progress Notes (Addendum)
[facility] Progress Note  2 Days Post-Op  Subjective: CC:  Pain controlled this morning on oral meds. Tolerating full liquids. +flatus, denies BM. Mobilizing in hallway. States drain output has decreased and "become less red". Denies bleeding from trochar sites or around drain. Eager for discharge.   Objective: Vital signs in last 24 hours: Temp:  [97.4 F (36.3 C)-98.2 F (36.8 C)] 98.2 F (36.8 C) (03/26 0555) Pulse Rate:  [77-95] 78 (03/26 0555) Resp:  [18] 18 (03/26 0555) BP: (115-138)/(77-84) 130/80 (03/26 0555) SpO2:  [96 %] 96 % (03/26 0555) Last BM Date: 12/30/17  Intake/Output from previous day: 03/25 0701 - 03/26 0700 In: 2402 [P.O.:1050; I.V.:1202; IV Piggyback:150] Out: 1995 [EYCXK:4818; Drains:45] Intake/Output this shift: Total I/O In: 360 [P.O.:360] Out: 10 [Drains:10]  PE: Gen:  Alert, NAD, pleasant Pulm:  Normal effort, clear to auscultation bilaterally Abd: Soft, non-tender, non-distended, bowel sounds present in all 4 quadrants, incisions C/D/I  JP drain: 50 cc/24 h SS  Skin: warm and dry, no rashes  Psych: A&Ox3   Lab Results:  Recent Labs    01/03/18 0444 01/04/18 0530  WBC 8.5 9.5  HGB 9.6* 9.9*  HCT 29.5* 30.1*  PLT 188 198   BMET Recent Labs    01/01/18 0958 01/03/18 0444  NA 136 141  K 3.6 4.3  CL 103 107  CO2 24 27  GLUCOSE 124* 193*  BUN 19 12  CREATININE 0.63 0.63  CALCIUM 8.1* 8.3*   PT/INR No results for input(s): LABPROT, INR in the last 72 hours. CMP     Component Value Date/Time   NA 141 01/03/2018 0444   NA 145 01/11/2017 1114   K 4.3 01/03/2018 0444   K 4.7 01/11/2017 1114   CL 107 01/03/2018 0444   CL 108 (H) 02/06/2013 1022   CO2 27 01/03/2018 0444   CO2 28 01/11/2017 1114   GLUCOSE 193 (H) 01/03/2018 0444   GLUCOSE 94 01/11/2017 1114   GLUCOSE 108 (H) 02/06/2013 1022   BUN 12 01/03/2018 0444   BUN 14.4 01/11/2017 1114   CREATININE 0.63 01/03/2018 0444   CREATININE 0.8 01/11/2017 1114    CALCIUM 8.3 (L) 01/03/2018 0444   CALCIUM 9.6 01/11/2017 1114   PROT 5.3 (L) 01/01/2018 0958   PROT 6.3 (L) 05/23/2015 1432   ALBUMIN 2.5 (L) 01/01/2018 0958   ALBUMIN 3.8 05/23/2015 1432   AST 26 01/01/2018 0958   AST 15 05/23/2015 1432   ALT 23 01/01/2018 0958   ALT 20 05/23/2015 1432   ALKPHOS 111 01/01/2018 0958   ALKPHOS 90 05/23/2015 1432   BILITOT 1.0 01/01/2018 0958   BILITOT 0.30 05/23/2015 1432   GFRNONAA >60 01/03/2018 0444   GFRAA >60 01/03/2018 0444   Lipase     Component Value Date/Time   LIPASE 25 12/31/2017 1045       Studies/Results: No results found.  Anti-infectives: Anti-infectives (From admission, onward)   Start     Dose/Rate Route Frequency Ordered Stop   12/31/17 1400  piperacillin-tazobactam (ZOSYN) IVPB 3.375 g     3.375 g 12.5 mL/hr over 240 Minutes Intravenous Every 8 hours 12/31/17 1335     12/31/17 1215  cefTRIAXone (ROCEPHIN) 1 g in sodium chloride 0.9 % 100 mL IVPB     1 g 200 mL/hr over 30 Minutes Intravenous  Once 12/31/17 1200 12/31/17 1345   12/31/17 1200  metroNIDAZOLE (FLAGYL) IVPB 500 mg  Status:  Discontinued     500 mg 100 mL/hr over  65 Minutes Intravenous  Once 12/31/17 1200 12/31/17 1343     Assessment/Plan Hodgkins lymphoma in remission  H/o bilateral PE - Eliquis held for surgery, plan to resume post-operatively Anxiety/depression Obesity - s/p gastric bypass > 20 years ago   Acute calculous cholecystitis with gallbladder empyema  POD#2 s/p laparoscopic cholecystectomy by Dr. Excell Seltzer - advance diet to SOFT and then low fat as tolerated - continue abx, plan to discharge home on PO abx  - JP drainage decreasing and non-bilious; likely can be removed prior to discharge - will confirm with MD.  - mobilize/IS  FEN: full liquids VTE: heparin gtt per pharmacy, H/H stable this AM, plan to resume Eliquis today vs tomorrow.  ID: IV Zosyn 3/22>>; plan to discharge on POabx for additional 4-5 days to complete a 7 day  course Foley: none  Follow up: [place]: resume Eliquis, possible discharge today vs tomorrow   LOS: 4 days    Joyce Payne , [facility] 01/04/2018, 9:27 AM Pager: [phone] Consults: [phone] Mon-Fri 7:00 am-4:30 pm Sat-Sun 7:00 am-11:30 am

## 2018-01-05 LAB — CULTURE, BLOOD (ROUTINE X 2)
Culture: NO GROWTH
Culture: NO GROWTH
Special Requests: ADEQUATE
Special Requests: ADEQUATE

## 2018-01-08 ENCOUNTER — Other Ambulatory Visit: Payer: BC Managed Care – PPO

## 2018-01-17 ENCOUNTER — Ambulatory Visit: Payer: BC Managed Care – PPO | Admitting: Hematology and Oncology

## 2018-01-17 ENCOUNTER — Telehealth: Payer: Self-pay | Admitting: *Deleted

## 2018-01-17 NOTE — Telephone Encounter (Signed)
Left message to call regarding appt with Dr Alvy Bimler.

## 2018-01-17 NOTE — Telephone Encounter (Signed)
Pt states she called on Sunday and spoke with after hours staff to cancel today's appt. Is still healing from surgery.  Message to schedulers to reschedule.

## 2018-01-19 ENCOUNTER — Telehealth: Payer: Self-pay | Admitting: Hematology and Oncology

## 2018-01-19 NOTE — Telephone Encounter (Signed)
Left message for patient regarding upcoming may appointments per 4/8 sch message

## 2018-01-26 ENCOUNTER — Other Ambulatory Visit: Payer: BC Managed Care – PPO

## 2018-02-07 ENCOUNTER — Other Ambulatory Visit: Payer: BC Managed Care – PPO

## 2018-02-09 HISTORY — PX: ABDOMINAL HYSTERECTOMY: SHX81

## 2018-02-11 ENCOUNTER — Telehealth: Payer: Self-pay | Admitting: *Deleted

## 2018-02-11 ENCOUNTER — Inpatient Hospital Stay: Payer: BC Managed Care – PPO | Attending: Hematology and Oncology | Admitting: Hematology and Oncology

## 2018-02-11 ENCOUNTER — Encounter: Payer: Self-pay | Admitting: Hematology and Oncology

## 2018-02-11 ENCOUNTER — Inpatient Hospital Stay: Payer: BC Managed Care – PPO

## 2018-02-11 VITALS — BP 125/65 | HR 98 | Temp 98.5°F | Resp 19 | Ht 66.0 in | Wt 217.2 lb

## 2018-02-11 DIAGNOSIS — I2692 Saddle embolus of pulmonary artery without acute cor pulmonale: Secondary | ICD-10-CM

## 2018-02-11 DIAGNOSIS — R1900 Intra-abdominal and pelvic swelling, mass and lump, unspecified site: Secondary | ICD-10-CM

## 2018-02-11 DIAGNOSIS — Z86711 Personal history of pulmonary embolism: Secondary | ICD-10-CM

## 2018-02-11 DIAGNOSIS — Z9049 Acquired absence of other specified parts of digestive tract: Secondary | ICD-10-CM

## 2018-02-11 DIAGNOSIS — Z79899 Other long term (current) drug therapy: Secondary | ICD-10-CM | POA: Diagnosis not present

## 2018-02-11 DIAGNOSIS — R971 Elevated cancer antigen 125 [CA 125]: Secondary | ICD-10-CM | POA: Insufficient documentation

## 2018-02-11 DIAGNOSIS — Z8572 Personal history of non-Hodgkin lymphomas: Secondary | ICD-10-CM | POA: Diagnosis not present

## 2018-02-11 DIAGNOSIS — M2550 Pain in unspecified joint: Secondary | ICD-10-CM | POA: Diagnosis not present

## 2018-02-11 DIAGNOSIS — E559 Vitamin D deficiency, unspecified: Secondary | ICD-10-CM

## 2018-02-11 DIAGNOSIS — Z8579 Personal history of other malignant neoplasms of lymphoid, hematopoietic and related tissues: Secondary | ICD-10-CM

## 2018-02-11 DIAGNOSIS — C561 Malignant neoplasm of right ovary: Secondary | ICD-10-CM | POA: Insufficient documentation

## 2018-02-11 LAB — CBC WITH DIFFERENTIAL/PLATELET
BASOS ABS: 0 10*3/uL (ref 0.0–0.1)
Basophils Relative: 0 %
Eosinophils Absolute: 0.1 10*3/uL (ref 0.0–0.5)
Eosinophils Relative: 1 %
HEMATOCRIT: 38.9 % (ref 34.8–46.6)
Hemoglobin: 12.9 g/dL (ref 11.6–15.9)
LYMPHS ABS: 2.3 10*3/uL (ref 0.9–3.3)
LYMPHS PCT: 37 %
MCH: 30.9 pg (ref 25.1–34.0)
MCHC: 33.2 g/dL (ref 31.5–36.0)
MCV: 93.3 fL (ref 79.5–101.0)
MONO ABS: 0.4 10*3/uL (ref 0.1–0.9)
MONOS PCT: 7 %
NEUTROS ABS: 3.5 10*3/uL (ref 1.5–6.5)
Neutrophils Relative %: 55 %
Platelets: 238 10*3/uL (ref 145–400)
RBC: 4.17 MIL/uL (ref 3.70–5.45)
RDW: 13.6 % (ref 11.2–14.5)
WBC: 6.3 10*3/uL (ref 3.9–10.3)

## 2018-02-11 LAB — COMPREHENSIVE METABOLIC PANEL
ALBUMIN: 3.8 g/dL (ref 3.5–5.0)
ALT: 24 U/L (ref 0–55)
ANION GAP: 4 (ref 3–11)
AST: 28 U/L (ref 5–34)
Alkaline Phosphatase: 111 U/L (ref 40–150)
BUN: 25 mg/dL (ref 7–26)
CHLORIDE: 107 mmol/L (ref 98–109)
CO2: 30 mmol/L — AB (ref 22–29)
Calcium: 9.3 mg/dL (ref 8.4–10.4)
Creatinine, Ser: 0.79 mg/dL (ref 0.60–1.10)
GFR calc Af Amer: >60 mL/min (ref >60)
GFR calc non Af Amer: >60 mL/min (ref >60)
GLUCOSE: 99 mg/dL (ref 70–140)
POTASSIUM: 4.8 mmol/L (ref 3.5–5.1)
SODIUM: 141 mmol/L (ref 136–145)
TOTAL PROTEIN: 6.2 g/dL — AB (ref 6.4–8.3)
Total Bilirubin: 0.3 mg/dL (ref 0.2–1.2)

## 2018-02-11 NOTE — Assessment & Plan Note (Signed)
She has chronic diffuse joint pain I am concerned about vitamin D deficiency I will order serum vitamin D for further evaluation

## 2018-02-11 NOTE — Assessment & Plan Note (Signed)
The appearance of the abnormal CT is not classic for recurrence of lymphoma She may need repeat imaging study soon and I plan to order further imaging once we have a plan in place to address the complex pelvic mass

## 2018-02-11 NOTE — Telephone Encounter (Signed)
Called and gave the patient the appt for May 10th at 10:45am, arrive at 10:30am. Informed the patient that a pelvic exam may be done

## 2018-02-11 NOTE — Assessment & Plan Note (Signed)
Her presentation with a complex pelvic mass is suspicious for possible GYN cancer, worrisome for right ovarian cancer given the asymmetry seen I will send her for blood work and tumor marker with CA-125 I recommend GYN oncology appointment for further evaluation and she agreed to proceed

## 2018-02-11 NOTE — Progress Notes (Signed)
[place] OFFICE PROGRESS NOTE  Patient Care Team: Lawerance Cruel, MD as PCP - General (Family Medicine) Heath Lark, MD as Consulting Physician (Hematology and Oncology)  ASSESSMENT & PLAN:  Ovarian ca [facility]) Her presentation with a complex pelvic mass is suspicious for possible GYN cancer, worrisome for right ovarian cancer given the asymmetry seen I will send her for blood work and tumor marker with CA-125 I recommend GYN oncology appointment for further evaluation and she agreed to proceed  History of lymphoma The appearance of the abnormal CT is not classic for recurrence of lymphoma She may need repeat imaging study soon and I plan to order further imaging once we have a plan in place to address the complex pelvic mass  Acute saddle pulmonary embolism without acute cor pulmonale ([place]) She had recurrent blood clots She has completed primary treatment for recent recurrence It is okay to hold anticoagulation therapy perioperatively if needed She will continue anticoagulation therapy for now for secondary prevention  Vitamin D deficiency She has chronic diffuse joint pain I am concerned about vitamin D deficiency I will order serum vitamin D for further evaluation   Orders Placed This Encounter  Procedures  . CBC with Differential/Platelet    Standing Status:   Future    Standing Expiration Date:   03/18/2019  . Comprehensive metabolic panel    Standing Status:   Future    Standing Expiration Date:   03/18/2019  . CA 125    Standing Status:   Future    Standing Expiration Date:   03/18/2019  . VITAMIN D 25 Hydroxy (Vit-D Deficiency, Fractures)    Standing Status:   Future    Standing Expiration Date:   03/18/2019  . Ambulatory referral to Gynecologic Oncology    Referral Priority:   Routine    Referral Type:   Consultation    Referral Reason:   Specialty Services Required    Referred to Provider:   Everitt Amber, MD    Number of Visits Requested:   1     INTERVAL HISTORY: Please see below for problem oriented charting. She returns for further follow-up to address abnormal CT imaging She has been complaining of vague abdominal pressure, discomfort and bloating for the past few months Around March 2019, she had CT imaging which showed evidence of acute cholecystitis along with complex pelvic mass She underwent cholecystectomy She had to reschedule her appointment last month due to feeling unwell She denies significant nausea or vomiting Her appetite is reduced She denies significant constipation She denies abnormal vaginal bleeding The patient has no children She became menopausal since chemotherapy in 2011  SUMMARY OF ONCOLOGIC HISTORY:  Joyce Payne was transferred to my care after her prior physician has left.  I reviewed the patient's records extensive and collaborated the history with the patient. Summary of her history is as follows: This is a woman with stage IIA Hodgkin's lymphoma in remission.  She presented in mid-June of 2011 with a palpable lymph node mass in the right neck and supraclavicular area. She was evaluated by Dr. Nonda Lou at the [facility] in [place]. CT scan showed additional adenopathy in the right paratracheal region. Apparently, PET scanning also showed an abnormal activity in the left hip. An initial staging bone marrow biopsy was negative for lymphoma. It is really not clear whether or not the abnormality in the left hip was really a focus of involvement with Hodgkin's disease. I personally tend to doubt this.  She was treated with 6 cycles of ABVD chemotherapy. Due to decline in her oxygen exchange, the bleomycin had to be deleted from the last 2 cycles.  She developed an extensive cellulitis of her right calf and thigh in November of 2011. Although lower extremity Dopplers were negative for clot, it was presumed that this was where the clot started. She was admitted to the hospital. Scanning  incidentally showed pulmonary emboli at that time (08/25/10). She was anticoagulated for 1 year.  She completed all planned chemotherapy approximately 09/30/10. Interim scanning after 4 cycles showed a complete response. Subsequent studies done at time she moved to [facility] in June 2012 showed ongoing complete remission. Scans done 02/06/2013 show no evidence for new disease. An asymptomatic lucent lesion in the anterior left femoral head is stable. The patient was discharged from the clinic in 2016. She was admitted to the hospital with acute bilateral pulmonary emboli in 2017, right greater than left with acute right LE DVT WITH EVIDENCE OF RIGHT HEART STRAIN. IR was consulted, patient underwent thrombolysis. The patient did well after thrombolysis and was transferred to regular floor on 9/15. She was placed on xarelto by pulmonology which she has been tolerating well.  REVIEW OF SYSTEMS:   Constitutional: Denies fevers, chills or abnormal weight loss Eyes: Denies blurriness of vision Ears, nose, mouth, throat, and face: Denies mucositis or sore throat Respiratory: Denies cough, dyspnea or wheezes Cardiovascular: Denies palpitation, chest discomfort or lower extremity swelling Skin: Denies abnormal skin rashes Lymphatics: Denies new lymphadenopathy or easy bruising Neurological:Denies numbness, tingling or new weaknesses Behavioral/Psych: Mood is stable, no new changes  All other systems were reviewed with the patient and are negative.  I have reviewed the past medical history, past surgical history, social history and family history with the patient and they are unchanged from previous note.  ALLERGIES:  is allergic to tape; latex; and povidone-iodine.  MEDICATIONS:  Current Outpatient Medications  Medication Sig Dispense Refill  . acetaminophen (TYLENOL) 500 MG tablet Take 1,000 mg by mouth every 6 (six) hours as needed (pain).    Marland Kitchen apixaban (ELIQUIS) 5 MG TABS tablet Take 1 tablet (5  mg total) by mouth 2 (two) times daily. 60 tablet 1  . Ascorbic Acid (VITAMIN C PO) Take 1 tablet by mouth daily.    Marland Kitchen azelastine (OPTIVAR) 0.05 % ophthalmic solution Place 1 drop into both eyes daily as needed.    . celecoxib (CELEBREX) 100 MG capsule Take 100 mg by mouth 2 (two) times daily.   9  . Cholecalciferol (VITAMIN D PO) Take 1 tablet by mouth daily.    . diazepam (VALIUM) 5 MG tablet Take 0.5 tablets (2.5 mg total) by mouth every 12 (twelve) hours as needed for anxiety. (Patient taking differently: Take 2.5-7.5 mg by mouth every 6 (six) hours as needed for anxiety. ) 30 tablet 0  . DULoxetine (CYMBALTA) 30 MG capsule Take 90 mg by mouth daily.     Marland Kitchen DYMISTA 137-50 MCG/ACT SUSP SHAKE LQ AND U 1 SPR IEN BID PRN Allergies  3  . fesoterodine (TOVIAZ) 8 MG TB24 tablet Take 8 mg by mouth at bedtime.     Marland Kitchen lisdexamfetamine (VYVANSE) 50 MG capsule Take 50 mg by mouth daily.     . Multiple Vitamin (MULTIVITAMIN) capsule Take 1 capsule by mouth daily.    [name] Bicarbonate (ZEGERID) 20-1100 MG CAPS capsule Take 1 capsule by mouth daily as needed (acid reflux).     Marland Kitchen oxyCODONE (OXY IR/ROXICODONE)  5 MG immediate release tablet Take 2 tablets (10 mg total) by mouth every 4 (four) hours as needed for moderate pain (if insufficient response to tramadol). 15 tablet 0  . polyethylene glycol (MIRALAX / GLYCOLAX) packet Take 17 g by mouth daily as needed for mild constipation. 14 each 0  . pregabalin (LYRICA) 50 MG capsule Take 50 mg by mouth 2 (two) times daily.    . traMADol (ULTRAM) 50 MG tablet Take 100 mg by mouth every 6 (six) hours as needed for pain.  1  . traZODone (DESYREL) 100 MG tablet Take 125 mg by mouth at bedtime as needed for sleep.   1  . TURMERIC CURCUMIN PO Take 1 tablet by mouth daily.     No current facility-administered medications for this visit.     PHYSICAL EXAMINATION: ECOG PERFORMANCE STATUS: 1 - Symptomatic but completely ambulatory  Vitals:   02/11/18 1326   BP: 125/65  Pulse: 98  Resp: 19  Temp: 98.5 F (36.9 C)  SpO2: 100%   Filed Weights   02/11/18 1326  Weight: 217 lb 3.2 oz (98.5 kg)    GENERAL:alert, no distress and comfortable SKIN: skin color, texture, turgor are normal, no rashes or significant lesions EYES: normal, Conjunctiva are pink and non-injected, sclera clear OROPHARYNX:no exudate, no erythema and lips, buccal mucosa, and tongue normal  NECK: supple, thyroid normal size, non-tender, without nodularity LYMPH:  no palpable lymphadenopathy in the cervical, axillary or inguinal LUNGS: clear to auscultation and percussion with normal breathing effort HEART: regular rate & rhythm and no murmurs and no lower extremity edema ABDOMEN:abdomen soft, non-tender and normal bowel sounds.  I did not appreciate any form of palpable mass or abdominal ascites Musculoskeletal:no cyanosis of digits and no clubbing  NEURO: alert & oriented x 3 with fluent speech, no focal motor/sensory deficits  LABORATORY DATA:  I have reviewed the data as listed    Component Value Date/Time   NA 141 01/03/2018 0444   NA 145 01/11/2017 1114   K 4.3 01/03/2018 0444   K 4.7 01/11/2017 1114   CL 107 01/03/2018 0444   CL 108 (H) 02/06/2013 1022   CO2 27 01/03/2018 0444   CO2 28 01/11/2017 1114   GLUCOSE 193 (H) 01/03/2018 0444   GLUCOSE 94 01/11/2017 1114   GLUCOSE 108 (H) 02/06/2013 1022   BUN 12 01/03/2018 0444   BUN 14.4 01/11/2017 1114   CREATININE 0.63 01/03/2018 0444   CREATININE 0.8 01/11/2017 1114   CALCIUM 8.3 (L) 01/03/2018 0444   CALCIUM 9.6 01/11/2017 1114   PROT 5.3 (L) 01/01/2018 0958   PROT 6.3 (L) 05/23/2015 1432   ALBUMIN 2.5 (L) 01/01/2018 0958   ALBUMIN 3.8 05/23/2015 1432   AST 26 01/01/2018 0958   AST 15 05/23/2015 1432   ALT 23 01/01/2018 0958   ALT 20 05/23/2015 1432   ALKPHOS 111 01/01/2018 0958   ALKPHOS 90 05/23/2015 1432   BILITOT 1.0 01/01/2018 0958   BILITOT 0.30 05/23/2015 1432   GFRNONAA >60 01/03/2018  0444   GFRAA >60 01/03/2018 0444    No results found for: SPEP, UPEP  Lab Results  Component Value Date   WBC 9.5 01/04/2018   NEUTROABS 13.5 (H) 12/31/2017   HGB 9.9 (L) 01/04/2018   HCT 30.1 (L) 01/04/2018   MCV 93.2 01/04/2018   PLT 198 01/04/2018      Chemistry      Component Value Date/Time   NA 141 01/03/2018 0444   NA 145 01/11/2017  1114   K 4.3 01/03/2018 0444   K 4.7 01/11/2017 1114   CL 107 01/03/2018 0444   CL 108 (H) 02/06/2013 1022   CO2 27 01/03/2018 0444   CO2 28 01/11/2017 1114   BUN 12 01/03/2018 0444   BUN 14.4 01/11/2017 1114   CREATININE 0.63 01/03/2018 0444   CREATININE 0.8 01/11/2017 1114      Component Value Date/Time   CALCIUM 8.3 (L) 01/03/2018 0444   CALCIUM 9.6 01/11/2017 1114   ALKPHOS 111 01/01/2018 0958   ALKPHOS 90 05/23/2015 1432   AST 26 01/01/2018 0958   AST 15 05/23/2015 1432   ALT 23 01/01/2018 0958   ALT 20 05/23/2015 1432   BILITOT 1.0 01/01/2018 0958   BILITOT 0.30 05/23/2015 1432       RADIOGRAPHIC STUDIES: I have reviewed recent CT imaging with the patient in great detail I have personally reviewed the radiological images as listed and agreed with the findings in the report.  All questions were answered. The patient knows to call the clinic with any problems, questions or concerns. No barriers to learning was detected.  I spent 30 minutes counseling the patient face to face. The total time spent in the appointment was 40 minutes and more than 50% was on counseling and review of test results  Heath Lark, MD 02/11/2018 2:08 PM

## 2018-02-11 NOTE — Assessment & Plan Note (Signed)
She had recurrent blood clots She has completed primary treatment for recent recurrence It is okay to hold anticoagulation therapy perioperatively if needed She will continue anticoagulation therapy for now for secondary prevention

## 2018-02-12 LAB — CA 125: Cancer Antigen (CA) 125: 57.4 U/mL — ABNORMAL HIGH (ref 0.0–38.1)

## 2018-02-12 LAB — VITAMIN D 25 HYDROXY (VIT D DEFICIENCY, FRACTURES): Vit D, 25-Hydroxy: 35.2 ng/mL (ref 30.0–100.0)

## 2018-02-14 ENCOUNTER — Telehealth: Payer: Self-pay | Admitting: *Deleted

## 2018-02-14 NOTE — Telephone Encounter (Signed)
-----   Message from Heath Lark, MD sent at 02/14/2018  8:24 AM EDT ----- Regarding: labs Pls let her know vitamin D level is good CA-125 is a bit high. I see Dr. Denman George is seeing her soon. She will likely need surgery ----- Message ----- From: Interface, Lab In [place] Sent: 02/11/2018   3:13 PM To: Heath Lark, MD

## 2018-02-14 NOTE — Telephone Encounter (Signed)
Notified of message below. Verbalized understanding 

## 2018-02-16 ENCOUNTER — Ambulatory Visit
Admission: RE | Admit: 2018-02-16 | Discharge: 2018-02-16 | Disposition: A | Discharge status: Home / Self Care | Payer: BC Managed Care – PPO | Source: Ambulatory Visit | Attending: Family Medicine | Admitting: Family Medicine

## 2018-02-16 DIAGNOSIS — M5412 Radiculopathy, cervical region: Secondary | ICD-10-CM

## 2018-02-16 DIAGNOSIS — H538 Other visual disturbances: Secondary | ICD-10-CM

## 2018-02-16 DIAGNOSIS — R410 Disorientation, unspecified: Secondary | ICD-10-CM

## 2018-02-16 MED ORDER — GADOBENATE DIMEGLUMINE 529 MG/ML IV SOLN
20.0000 mL | Freq: Once | INTRAVENOUS | Status: AC | PRN
  Administered 2018-02-16: 20 mL via INTRAVENOUS

## 2018-02-18 ENCOUNTER — Encounter: Payer: Self-pay | Admitting: Gynecologic Oncology

## 2018-02-18 ENCOUNTER — Inpatient Hospital Stay (HOSPITAL_BASED_OUTPATIENT_CLINIC_OR_DEPARTMENT_OTHER): Payer: BC Managed Care – PPO | Admitting: Gynecologic Oncology

## 2018-02-18 VITALS — BP 105/68 | HR 108 | Temp 97.7°F | Resp 20 | Ht 66.0 in | Wt 214.1 lb

## 2018-02-18 DIAGNOSIS — R1900 Intra-abdominal and pelvic swelling, mass and lump, unspecified site: Secondary | ICD-10-CM

## 2018-02-18 NOTE — Progress Notes (Signed)
Consult Note: Gyn-Onc  Consult was requested by Dr. Alvy Payne for the evaluation of Joyce Payne 57 y.o. female  CC:  Chief Complaint  Patient presents with  . Malignant neoplasm of right ovary [facility])    Assessment/Plan:  Joyce Payne  is a 57 y.o.  year old with a pelvic mass likely originating from one or both ovaries. She has a mildly elevated CA 125.  I reviewed the CT images with Joyce Payne.  We reviewed her lab evaluation.  I am recommending surgical evaluation with a robotically assisted bilateral salpingo-oophorectomy and frozen section.  If malignancy is identified I would recommend hysterectomy, lymphadenectomy, omental sampling, and biopsies as part of staging.  My overall suspicion that this is malignant is somewhat low given the uniform characteristics of the cystic masses on imaging and the relatively mild elevation in Ca1 25.  However, given her postmenopausal age, the complex nature of the cystic masses, and the elevated Ca1 25 think it is important to rule out malignancy, and Joyce Payne understands he had a way that this can be done as with surgical pathology.  She stands substantial risk for bleeding complications given her Eliquis use in the setting of prior significant VTE events.  I am going to hold her Eliquis for 3 days preoperatively and postoperatively will bridge her with Lovenox for a week transitioning to Eliquis after there is no apparent bleeding sequelae.  I discussed the risks associated with surgery including  bleeding, infection, damage to internal organs (such as bladder,ureters, bowels), blood clot, reoperation and rehospitalization.  I discussed that these risks are elevated in Joyce Payne's case because of her anticoagulant use, obesity, and due to her history of prior complex surgeries including a laparotomy for gastric bypass, and a recent laparoscopic cholecystectomy in the setting of a grossly inflamed gallbladder.  Of note upper abdominal adhesions were appreciated  at the time of that surgery.  Joyce Payne understands that conversion to laparotomy may be necessary depending upon surgical findings.  If malignancy is identified on final pathology, Joyce Payne understands that adjuvant chemotherapy is usually recommended if ovarian primary malignancy is confirmed.  Surgery is scheduled for this coming week on May 14.   HPI: Joyce Payne is a 57 year old woman who was seen in consultation at the request of Dr. Alvy Payne for pelvic mass.  The patient's history began in March 2019 when she was seen in the ER of [facility] with acute abdominal pain, high-grade fever, and sepsis.  CT scan of the abdomen and pelvis was performed on December 31, 2017 which revealed a distended gallbladder with mucosal enhancement and mild surrounding inflammatory changes compatible with acute cholecystitis.  There was adjacent inflammatory reactive changes along the duodenum pancreatic head and colon.  A midline pelvic complex partially cystic mass measuring 7 x 9 cm was appreciated.  No ascites carcinomatosis or other evidence of metastatic disease was appreciated.  She was admitted to the hospital for 48 hours of antibiotics and to take her off her Eliquis.  She then went to the operating room with Joyce Payne who performed laparoscopic lysis of adhesions and cholecystectomy.  A drain was left.  Lysis of adhesions was necessary in the upper abdomen to separate some prior adhesions from her gastric bypass surgery.  She healed well from her surgery with no complications.  Patient has a remote history of lymphoma in 2011 treated with chemotherapy with a complete response.  During that treatment she developed DVT and PE was treated for 12 months  with anticoagulant therapy.  In 2017 she developed an acute saddle pulmonary embolism after a prolonged period of immobility on a car ride.  She was treated with Eliquis following this event and is currently on therapeutic Eliquis.  She is a remote history of  gastric bypass via laparotomy and more recent history of cholecystectomy as noted above but no other prior abdominal surgeries.  She has had no pregnancies.  She is postmenopausal since being on chemotherapy for her lymphoma.  She is no history of abnormal Pap smears.  She has a strong family history for malignancy including father with colon cancer at a young age who died in his 75s.  She has not had genetic testing  Current Meds:  Outpatient Encounter Medications as of 02/18/2018  Medication Sig  . acetaminophen (TYLENOL) 500 MG tablet Take 1,000 mg by mouth every 6 (six) hours as needed (pain).  Marland Kitchen apixaban (ELIQUIS) 5 MG TABS tablet Take 1 tablet (5 mg total) by mouth 2 (two) times daily.  . Ascorbic Acid (VITAMIN C PO) Take 1 tablet by mouth daily.  Marland Kitchen azelastine (OPTIVAR) 0.05 % ophthalmic solution Place 1 drop into both eyes daily as needed.  . celecoxib (CELEBREX) 100 MG capsule Take 100 mg by mouth 2 (two) times daily.   . Cholecalciferol (VITAMIN D PO) Take 1 tablet by mouth daily.  . diazepam (VALIUM) 5 MG tablet Take 0.5 tablets (2.5 mg total) by mouth every 12 (twelve) hours as needed for anxiety. (Patient taking differently: Take 2.5-7.5 mg by mouth every 6 (six) hours as needed for anxiety. )  . DULoxetine (CYMBALTA) 30 MG capsule Take 90 mg by mouth daily.   Marland Kitchen DYMISTA 137-50 MCG/ACT SUSP SHAKE LQ AND U 1 SPR IEN BID PRN Allergies  . fesoterodine (TOVIAZ) 8 MG TB24 tablet Take 8 mg by mouth at bedtime.   Marland Kitchen lisdexamfetamine (VYVANSE) 50 MG capsule Take 50 mg by mouth daily.   . Multiple Vitamin (MULTIVITAMIN) capsule Take 1 capsule by mouth daily.  Joyce Payne Bicarbonate (ZEGERID) 20-1100 MG CAPS capsule Take 1 capsule by mouth daily as needed (acid reflux).   . polyethylene glycol (MIRALAX / GLYCOLAX) packet Take 17 g by mouth daily as needed for mild constipation.  . pregabalin (LYRICA) 50 MG capsule Take 50 mg by mouth 2 (two) times daily.  . traMADol (ULTRAM) 50 MG tablet  Take 100 mg by mouth 3 (three) times daily as needed for moderate pain.   . traZODone (DESYREL) 100 MG tablet Take 125 mg by mouth at bedtime. Takes 1.25 tablets.  . TURMERIC CURCUMIN PO Take 2 capsules by mouth daily.   . [DISCONTINUED] oxyCODONE (OXY IR/ROXICODONE) 5 MG immediate release tablet Take 2 tablets (10 mg total) by mouth every 4 (four) hours as needed for moderate pain (if insufficient response to tramadol). (Patient not taking: Reported on 02/18/2018)   No facility-administered encounter medications on file as of 02/18/2018.     Allergy:  Allergies  Allergen Reactions  . Tape Other (See Comments)    TEARS THE SKIN (SKIN IS THIN!!)  . Latex Rash  . Povidone-Iodine Rash    Social Hx:   Social History   Socioeconomic History  . Marital status: Married    Spouse name: Not on file  . Number of children: 0  . Years of education: Not on file  . Highest education level: Not on file  Occupational History  . Occupation: Agricultural engineer: [employer]  .  Financial resource strain: Not on file  . Food insecurity:    Worry: Not on file    Inability: Not on file  . Transportation needs:    Medical: Not on file    Non-medical: Not on file  Tobacco Use  . Smoking status: Former Smoker    Packs/day: 1.50    Years: 17.00    Pack years: 25.50    Types: Cigarettes    Last attempt to quit: 10/22/2006    Years since quitting: 11.3  . Smokeless tobacco: Former Network engineer and Sexual Activity  . Alcohol use: No  . Drug use: Yes    Types: Amphetamines, Benzodiazepines, Opium, Hydrocodone  . Sexual activity: Yes    Birth control/protection: None  Lifestyle  . Physical activity:    Days per week: Not on file    Minutes per session: Not on file  . Stress: Not on file  Relationships  . Social connections:    Talks on phone: Not on file    Gets together: Not on file    Attends religious service: Not on file    Active member of club or  organization: Not on file    Attends meetings of clubs or organizations: Not on file    Relationship status: Not on file  . Intimate partner violence:    Fear of current or ex partner: Not on file    Emotionally abused: Not on file    Physically abused: Not on file    Forced sexual activity: Not on file  Other Topics Concern  . Not on file  Social History Narrative   Regular exercise- NO    Past Surgical Hx:  Past Surgical History:  Procedure Laterality Date  . CHOLECYSTECTOMY N/A 01/02/2018   Procedure: LAPAROSCOPIC CHOLECYSTECTOMY WITH INTRAOPERATIVE CHOLANGIOGRAM;  Surgeon: Excell Seltzer, MD;  Location: [facility];  Service: General;  Laterality: N/A;  . GASTRIC BYPASS  1999  . IR GENERIC HISTORICAL  06/25/2016   IR US GUIDE VASC ACCESS RIGHT 06/25/2016 Sandi Mariscal, MD [facility]  . IR GENERIC HISTORICAL  06/25/2016   IR ANGIOGRAM SELECTIVE EACH ADDITIONAL VESSEL 06/25/2016 Sandi Mariscal, MD [facility]  . IR GENERIC HISTORICAL  06/25/2016   IR ANGIOGRAM SELECTIVE EACH ADDITIONAL VESSEL 06/25/2016 Sandi Mariscal, MD [facility]  . IR GENERIC HISTORICAL  06/25/2016   IR ANGIOGRAM PULMONARY BILATERAL SELECTIVE 06/25/2016 Sandi Mariscal, MD [facility]  . IR GENERIC HISTORICAL  06/25/2016   IR INFUSION THROMBOL ARTERIAL INITIAL (MS) 06/25/2016 Sandi Mariscal, MD [facility]  . IR GENERIC HISTORICAL  06/25/2016   IR INFUSION THROMBOL ARTERIAL INITIAL (MS) 06/25/2016 Sandi Mariscal, MD [facility]  . IR GENERIC HISTORICAL  06/26/2016   IR THROMB F/U EVAL ART/VEN FINAL DAY (MS) 06/26/2016 Aletta Edouard, MD [facility]  . [place] SURGERY  2009  . ORIF TOE FRACTURE Left 06/27/2015   Procedure: OPEN REDUCTION INTERNAL FIXATION (ORIF) LEFT FOURTH AND FIFTH METATARSAL (TOE) FRACTURE NONUNION;  Surgeon: Wylene Simmer, MD;  Location: [place];  Service: Orthopedics;  Laterality: Left;  . PATELLA FRACTURE SURGERY    . PORT-A-CATH INSERTION  2011  . SHOULDER CLOSED REDUCTION  01/18/2012    Procedure: CLOSED MANIPULATION SHOULDER;  Surgeon: Johnn Hai, MD;  Location: [facility];  Service: Orthopedics;  Laterality: Left;  . TONSILECTOMY, ADENOIDECTOMY, BILATERAL MYRINGOTOMY AND TUBES  1985    Past Medical Hx:  Past Medical History:  Diagnosis Date  . Anxiety   . Arthritis  of knee   . Arthritis of lumbar spine   . Chronic back pain   . Depression   . Depression with anxiety 02/16/2012  . GERD (gastroesophageal reflux disease)   . H/O gastric bypass 02/15/2012   6/88  . History of DVT (deep vein thrombosis) NOV 2011- RIGHT LOWER EXTREMITY  . 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 Hx of pulmonary embolus 02/15/2012   Occurred during chemo for Hodgkin's dis 2011  . Impingement syndrome of left shoulder   . Neuromuscular disorder ([place])   . Peripheral vascular disease ([place])   . Pneumonia   . Recurrent binge eating 02/16/2012   Reason for topomax  . S/P gastric bypass   . Sinus infection   . Sleep apnea   . Spinal headache     Past Gynecological History:  G0 Patient's last menstrual period was 10/25/2012.  Family Hx:  Family History  Problem Relation Age of Onset  . Hypertension Mother   . Hyperlipidemia Mother   . Cancer Father 24       colon can  . Hypertension Other   . Cancer Other        colon, 1st degree relative<60    Review of Systems:  Constitutional  Feels well,    ENT Normal appearing ears and nares bilaterally Skin/Breast  No rash, sores, jaundice, itching, dryness Cardiovascular  No chest pain, shortness of breath, or edema  Pulmonary  No cough or wheeze.  Gastro Intestinal  No nausea, vomitting, or diarrhoea. No bright red blood  per rectum, no abdominal pain, change in bowel movement, or constipation.  Genito Urinary  No frequency, urgency, dysuria, no pain Musculo Skeletal  No myalgia, arthralgia, joint swelling or pain  Neurologic  No weakness, numbness, change in gait,  Psychology  No depression, anxiety, insomnia.   Vitals:  Blood pressure 105/68, pulse (!) 108, temperature 97.7 F (36.5 C), temperature source Oral, resp. rate 20, height 5\' 6"  (1.676 m), weight 214 lb 1.6 oz (97.1 kg), last menstrual period 10/25/2012, SpO2 100 %.  Physical Exam: WD in NAD Neck  Supple NROM, without any enlargements.  Lymph Node Survey No cervical supraclavicular or inguinal adenopathy Cardiovascular  Pulse normal rate, regularity and rhythm. S1 and S2 normal.  Lungs  Clear to auscultation bilateraly, without wheezes/crackles/rhonchi. Good air movement.  Skin  No rash/lesions/breakdown  Psychiatry  Alert and oriented to person, place, and time  Abdomen  Normoactive bowel sounds, abdomen soft, non-tender and obese without evidence of hernia.  Back No CVA tenderness Genito Urinary  Vulva/vagina: Normal external female genitalia.   No lesions. No discharge or bleeding.  Bladder/urethra:  No lesions or masses, well supported bladder  Vagina: normal  Cervix: Normal appearing, no lesions.  Uterus:  Small, mobile, no parametrial involvement or nodularity.  Adnexa: fullness, smooth consistent with known masses. Rectal  Good tone, no masses no cul de sac nodularity. The smooth posterior pelvic masses are appreciated but not adherent to rectum. Extremities  No bilateral cyanosis, clubbing or edema.   Thereasa Solo, MD  02/18/2018, 1:09 PM

## 2018-02-18 NOTE — H&P (View-Only) (Signed)
Consult Note: Gyn-Onc  Consult was requested by Dr. Alvy Bimler for the evaluation of Joyce Payne 57 y.o. female  CC:  Chief Complaint  Patient presents with  . Malignant neoplasm of right ovary [facility])    Assessment/Plan:  Joyce Payne  is a 57 y.o.  year old with a pelvic mass likely originating from one or both ovaries. She has a mildly elevated CA 125.  I reviewed the CT images with Joyce Payne.  We reviewed her lab evaluation.  I am recommending surgical evaluation with a robotically assisted bilateral salpingo-oophorectomy and frozen section.  If malignancy is identified I would recommend hysterectomy, lymphadenectomy, omental sampling, and biopsies as part of staging.  My overall suspicion that this is malignant is somewhat low given the uniform characteristics of the cystic masses on imaging and the relatively mild elevation in Ca1 25.  However, given her postmenopausal age, the complex nature of the cystic masses, and the elevated Ca1 25 think it is important to rule out malignancy, and Joyce Payne understands he had a way that this can be done as with surgical pathology.  She stands substantial risk for bleeding complications given her Eliquis use in the setting of prior significant VTE events.  I am going to hold her Eliquis for 3 days preoperatively and postoperatively will bridge her with Lovenox for a week transitioning to Eliquis after there is no apparent bleeding sequelae.  I discussed the risks associated with surgery including  bleeding, infection, damage to internal organs (such as bladder,ureters, bowels), blood clot, reoperation and rehospitalization.  I discussed that these risks are elevated in Joyce Payne's case because of her anticoagulant use, obesity, and due to her history of prior complex surgeries including a laparotomy for gastric bypass, and a recent laparoscopic cholecystectomy in the setting of a grossly inflamed gallbladder.  Of note upper abdominal adhesions were appreciated  at the time of that surgery.  Joyce Payne understands that conversion to laparotomy may be necessary depending upon surgical findings.  If malignancy is identified on final pathology, Joyce Payne understands that adjuvant chemotherapy is usually recommended if ovarian primary malignancy is confirmed.  Surgery is scheduled for this coming week on May 14.   HPI: Joyce Payne is a 57 year old woman who was seen in consultation at the request of Dr. Alvy Bimler for pelvic mass.  The patient's history began in March 2019 when she was seen in the ER of [facility] long hospital with acute abdominal pain, high-grade fever, and sepsis.  CT scan of the abdomen and pelvis was performed on December 31, 2017 which revealed a distended gallbladder with mucosal enhancement and mild surrounding inflammatory changes compatible with acute cholecystitis.  There was adjacent inflammatory reactive changes along the duodenum pancreatic head and colon.  A midline pelvic complex partially cystic mass measuring 7 x 9 cm was appreciated.  No ascites carcinomatosis or other evidence of metastatic disease was appreciated.  She was admitted to the hospital for 48 hours of antibiotics and to take her off her Eliquis.  She then went to the operating room with Dr. Excell Seltzer who performed laparoscopic lysis of adhesions and cholecystectomy.  A drain was left.  Lysis of adhesions was necessary in the upper abdomen to separate some prior adhesions from her gastric bypass surgery.  She healed well from her surgery with no complications.  Patient has a remote history of lymphoma in 2011 treated with chemotherapy with a complete response.  During that treatment she developed DVT and PE was treated for 12 months  with anticoagulant therapy.  In 2017 she developed an acute saddle pulmonary embolism after a prolonged period of immobility on a car ride.  She was treated with Eliquis following this event and is currently on therapeutic Eliquis.  She is a remote history of  gastric bypass via laparotomy and more recent history of cholecystectomy as noted above but no other prior abdominal surgeries.  She has had no pregnancies.  She is postmenopausal since being on chemotherapy for her lymphoma.  She is no history of abnormal Pap smears.  She has a strong family history for malignancy including father with colon cancer at a young age who died in his 21s.  She has not had genetic testing  Current Meds:  Outpatient Encounter Medications as of 02/18/2018  Medication Sig  . acetaminophen (TYLENOL) 500 MG tablet Take 1,000 mg by mouth every 6 (six) hours as needed (pain).  Marland Kitchen apixaban (ELIQUIS) 5 MG TABS tablet Take 1 tablet (5 mg total) by mouth 2 (two) times daily.  . Ascorbic Acid (VITAMIN C PO) Take 1 tablet by mouth daily.  Marland Kitchen azelastine (OPTIVAR) 0.05 % ophthalmic solution Place 1 drop into both eyes daily as needed.  . celecoxib (CELEBREX) 100 MG capsule Take 100 mg by mouth 2 (two) times daily.   . Cholecalciferol (VITAMIN D PO) Take 1 tablet by mouth daily.  . diazepam (VALIUM) 5 MG tablet Take 0.5 tablets (2.5 mg total) by mouth every 12 (twelve) hours as needed for anxiety. (Patient taking differently: Take 2.5-7.5 mg by mouth every 6 (six) hours as needed for anxiety. )  . DULoxetine (CYMBALTA) 30 MG capsule Take 90 mg by mouth daily.   Marland Kitchen DYMISTA 137-50 MCG/ACT SUSP SHAKE LQ AND U 1 SPR IEN BID PRN Allergies  . fesoterodine (TOVIAZ) 8 MG TB24 tablet Take 8 mg by mouth at bedtime.   Marland Kitchen lisdexamfetamine (VYVANSE) 50 MG capsule Take 50 mg by mouth daily.   . Multiple Vitamin (MULTIVITAMIN) capsule Take 1 capsule by mouth daily.  Joyce Payne (ZEGERID) 20-1100 MG CAPS capsule Take 1 capsule by mouth daily as needed (acid reflux).   . polyethylene glycol (MIRALAX / GLYCOLAX) packet Take 17 g by mouth daily as needed for mild constipation.  . pregabalin (LYRICA) 50 MG capsule Take 50 mg by mouth 2 (two) times daily.  . traMADol (ULTRAM) 50 MG tablet  Take 100 mg by mouth 3 (three) times daily as needed for moderate pain.   . traZODone (DESYREL) 100 MG tablet Take 125 mg by mouth at bedtime. Takes 1.25 tablets.  . TURMERIC CURCUMIN PO Take 2 capsules by mouth daily.   . [DISCONTINUED] oxyCODONE (OXY IR/ROXICODONE) 5 MG immediate release tablet Take 2 tablets (10 mg total) by mouth every 4 (four) hours as needed for moderate pain (if insufficient response to tramadol). (Patient not taking: Reported on 02/18/2018)   No facility-administered encounter medications on file as of 02/18/2018.     Allergy:  Allergies  Allergen Reactions  . Tape Other (See Comments)    TEARS THE SKIN (SKIN IS THIN!!)  . Latex Rash  . Povidone-Iodine Rash    Social Hx:   Social History   Socioeconomic History  . Marital status: Married    Spouse name: Not on file  . Number of children: 0  . Years of education: Not on file  . Highest education level: Not on file  Occupational History  . Occupation: Agricultural engineer: [employer]  .  Financial resource strain: Not on file  . Food insecurity:    Worry: Not on file    Inability: Not on file  . Transportation needs:    Medical: Not on file    Non-medical: Not on file  Tobacco Use  . Smoking status: Former Smoker    Packs/day: 1.50    Years: 17.00    Pack years: 25.50    Types: Cigarettes    Last attempt to quit: 10/22/2006    Years since quitting: 11.3  . Smokeless tobacco: Former Network engineer and Sexual Activity  . Alcohol use: No  . Drug use: Yes    Types: Amphetamines, Benzodiazepines, Opium, Hydrocodone  . Sexual activity: Yes    Birth control/protection: None  Lifestyle  . Physical activity:    Days per week: Not on file    Minutes per session: Not on file  . Stress: Not on file  Relationships  . Social connections:    Talks on phone: Not on file    Gets together: Not on file    Attends religious service: Not on file    Active member of club or  organization: Not on file    Attends meetings of clubs or organizations: Not on file    Relationship status: Not on file  . Intimate partner violence:    Fear of current or ex partner: Not on file    Emotionally abused: Not on file    Physically abused: Not on file    Forced sexual activity: Not on file  Other Topics Concern  . Not on file  Social History Narrative   Regular exercise- NO    Past Surgical Hx:  Past Surgical History:  Procedure Laterality Date  . CHOLECYSTECTOMY N/A 01/02/2018   Procedure: LAPAROSCOPIC CHOLECYSTECTOMY WITH INTRAOPERATIVE CHOLANGIOGRAM;  Surgeon: Excell Seltzer, MD;  Location: [facility];  Service: General;  Laterality: N/A;  . GASTRIC BYPASS  1999  . IR GENERIC HISTORICAL  06/25/2016   IR US GUIDE VASC ACCESS RIGHT 06/25/2016 Sandi Mariscal, MD [facility]  . IR GENERIC HISTORICAL  06/25/2016   IR ANGIOGRAM SELECTIVE EACH ADDITIONAL VESSEL 06/25/2016 Sandi Mariscal, MD [facility]  . IR GENERIC HISTORICAL  06/25/2016   IR ANGIOGRAM SELECTIVE EACH ADDITIONAL VESSEL 06/25/2016 Sandi Mariscal, MD [facility]  . IR GENERIC HISTORICAL  06/25/2016   IR ANGIOGRAM PULMONARY BILATERAL SELECTIVE 06/25/2016 Sandi Mariscal, MD [facility]  . IR GENERIC HISTORICAL  06/25/2016   IR INFUSION THROMBOL ARTERIAL INITIAL (MS) 06/25/2016 Sandi Mariscal, MD [facility]  . IR GENERIC HISTORICAL  06/25/2016   IR INFUSION THROMBOL ARTERIAL INITIAL (MS) 06/25/2016 Sandi Mariscal, MD [facility]  . IR GENERIC HISTORICAL  06/26/2016   IR THROMB F/U EVAL ART/VEN FINAL DAY (MS) 06/26/2016 Aletta Edouard, MD [facility]  . [place] SURGERY  2009  . ORIF TOE FRACTURE Left 06/27/2015   Procedure: OPEN REDUCTION INTERNAL FIXATION (ORIF) LEFT FOURTH AND FIFTH METATARSAL (TOE) FRACTURE NONUNION;  Surgeon: Wylene Simmer, MD;  [facility];  Service: Orthopedics;  Laterality: Left;  . PATELLA FRACTURE SURGERY    . PORT-A-CATH INSERTION  2011  . SHOULDER CLOSED REDUCTION  01/18/2012    Procedure: CLOSED MANIPULATION SHOULDER;  Surgeon: Johnn Hai, MD;  Location: [facility];  Service: Orthopedics;  Laterality: Left;  . TONSILECTOMY, ADENOIDECTOMY, BILATERAL MYRINGOTOMY AND TUBES  1985    Past Medical Hx:  Past Medical History:  Diagnosis Date  . Anxiety   . Arthritis  of knee   . Arthritis of lumbar spine   . Chronic back pain   . Depression   . Depression with anxiety 02/16/2012  . GERD (gastroesophageal reflux disease)   . H/O gastric bypass 02/15/2012   6/88  . History of DVT (deep vein thrombosis) NOV 2011- RIGHT LOWER EXTREMITY  . 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 Hx of pulmonary embolus 02/15/2012   Occurred during chemo for Hodgkin's dis 2011  . Impingement syndrome of left shoulder   . Neuromuscular disorder ([place])   . Peripheral vascular disease ([place])   . Pneumonia   . Recurrent binge eating 02/16/2012   Reason for topomax  . S/P gastric bypass   . Sinus infection   . Sleep apnea   . Spinal headache     Past Gynecological History:  G0 Patient's last menstrual period was 10/25/2012.  Family Hx:  Family History  Problem Relation Age of Onset  . Hypertension Mother   . Hyperlipidemia Mother   . Cancer Father 17       colon can  . Hypertension Other   . Cancer Other        colon, 1st degree relative<60    Review of Systems:  Constitutional  Feels well,    ENT Normal appearing ears and nares bilaterally Skin/Breast  No rash, sores, jaundice, itching, dryness Cardiovascular  No chest pain, shortness of breath, or edema  Pulmonary  No cough or wheeze.  Gastro Intestinal  No nausea, vomitting, or diarrhoea. No bright red blood  per rectum, no abdominal pain, change in bowel movement, or constipation.  Genito Urinary  No frequency, urgency, dysuria, no pain Musculo Skeletal  No myalgia, arthralgia, joint swelling or pain  Neurologic  No weakness, numbness, change in gait,  Psychology  No depression, anxiety, insomnia.   Vitals:  Blood pressure 105/68, pulse (!) 108, temperature 97.7 F (36.5 C), temperature source Oral, resp. rate 20, height 5\' 6"  (1.676 m), weight 214 lb 1.6 oz (97.1 kg), last menstrual period 10/25/2012, SpO2 100 %.  Physical Exam: WD in NAD Neck  Supple NROM, without any enlargements.  Lymph Node Survey No cervical supraclavicular or inguinal adenopathy Cardiovascular  Pulse normal rate, regularity and rhythm. S1 and S2 normal.  Lungs  Clear to auscultation bilateraly, without wheezes/crackles/rhonchi. Good air movement.  Skin  No rash/lesions/breakdown  Psychiatry  Alert and oriented to person, place, and time  Abdomen  Normoactive bowel sounds, abdomen soft, non-tender and obese without evidence of hernia.  Back No CVA tenderness Genito Urinary  Vulva/vagina: Normal external female genitalia.   No lesions. No discharge or bleeding.  Bladder/urethra:  No lesions or masses, well supported bladder  Vagina: normal  Cervix: Normal appearing, no lesions.  Uterus:  Small, mobile, no parametrial involvement or nodularity.  Adnexa: fullness, smooth consistent with known masses. Rectal  Good tone, no masses no cul de sac nodularity. The smooth posterior pelvic masses are appreciated but not adherent to rectum. Extremities  No bilateral cyanosis, clubbing or edema.   Thereasa Solo, MD  02/18/2018, 1:09 PM

## 2018-02-18 NOTE — Patient Instructions (Addendum)
Joyce Payne  02/18/2018   Your procedure is scheduled on: 02/22/2018   Report to [facility] Main  Entrance  Report to admitting at      8:00 AM    Call this number if you have problems the morning of surgery [phone]   Remember: Do not eat food:After Midnight..  Eat a light diet the day before surgery.                                       Examples include: soups, toast, yogurt, broth and mashed potatoes.  Things to avoid include : carbonated beverages, raw fruits and vegetables and beans.               NO SOLID FOOD AFTER MIDNIGHT THE NIGHT PRIOR TO SURGERY. NOTHING BY MOUTH EXCEPT CLEAR LIQUIDS UNTIL 3 HOURS PRIOR TO [place] SURGERY. PLEASE FINISH ENSURE DRINK PER                     SURGEON ORDER 3 HOURS PRIOR TO SCHEDULED SURGERY TIME WHICH NEEDS TO BE COMPLETED AT ____7:00 AM _.    CLEAR LIQUID DIET   Foods Allowed                                                                     Foods Excluded  Coffee and tea, regular and decaf                             liquids that you cannot  Plain Jell-O in any flavor                                             see through such as: Fruit ices (not with fruit pulp)                                     milk, soups, orange juice  Iced Popsicles                                    All solid food                                    Cranberry, grape and apple juices Sports drinks like Gatorade Lightly seasoned clear broth or consume(fat free) Sugar, honey syrup  Sample Menu Breakfast                                Lunch  Supper Cranberry juice                    Beef broth                            Chicken broth Jell-O                                     Grape juice                           Apple juice Coffee or tea                        Jell-O                                      Popsicle                                                Coffee or tea                         Coffee or tea  _____________________________________________________________________    Take these medicines the morning of surgery with A SIP OF WATER: Eye drops as usual, Valium if needed, Cymbalta, Dymista if needed, Zegerid,  Lyrica                                 You may not have any metal on your body including hair pins and              piercings  Do not wear jewelry, make-up, lotions, powders or perfumes, deodorant             Do not wear nail polish.  Do not shave  48 hours prior to surgery.              Do not bring valuables to the hospital. [place].  Contacts, dentures or bridgework may not be worn into surgery.      Patients discharged the day of surgery will not be allowed to drive home.  Name and phone number of your driver:  Special Instructions: coughing and deep breathing exercises, leg exercises               Please read over the following fact sheets you were given: _____________________________________________________________________                                        _____________________________________________________________________  [facility] - Preparing for Surgery Before surgery, you can play an important role.  Because skin is not sterile, your skin needs to be as free of germs as possible.  You can reduce the number of germs on your skin by washing with CHG (chlorahexidine gluconate) soap before surgery.  CHG is an antiseptic  cleaner which kills germs and bonds with the skin to continue killing germs even after washing. Please DO NOT use if you have an allergy to CHG or antibacterial soaps.  If your skin becomes reddened/irritated stop using the CHG and inform your nurse when you arrive at Short Stay. Do not shave (including legs and underarms) for at least 48 hours prior to the first CHG shower.  You may shave your face/neck. Please follow these instructions carefully:  1.  Shower  with CHG Soap the night before surgery and the  morning of Surgery.  2.  If you choose to wash your hair, wash your hair first as usual with your  normal  shampoo.  3.  After you shampoo, rinse your hair and body thoroughly to remove the  shampoo.                           4.  Use CHG as you would any other liquid soap.  You can apply chg directly  to the skin and wash                       Gently with a scrungie or clean washcloth.  5.  Apply the CHG Soap to your body ONLY FROM THE NECK DOWN.   Do not use on face/ open                           Wound or open sores. Avoid contact with eyes, ears mouth and genitals (private parts).                       Wash face,  Genitals (private parts) with your normal soap.             6.  Wash thoroughly, paying special attention to the area where your surgery  will be performed.  7.  Thoroughly rinse your body with warm water from the neck down.  8.  DO NOT shower/wash with your normal soap after using and rinsing off  the CHG Soap.                9.  Pat yourself dry with a clean towel.            10.  Wear clean pajamas.            11.  Place clean sheets on your bed the night of your first shower and do not  sleep with pets. Day of Surgery : Do not apply any lotions/deodorants the morning of surgery.  Please wear clean clothes to the hospital/surgery center.  FAILURE TO FOLLOW THESE INSTRUCTIONS MAY RESULT IN THE CANCELLATION OF YOUR SURGERY PATIENT SIGNATURE_________________________________  NURSE SIGNATURE__________________________________  ________________________________________________________________________  WHAT IS A BLOOD TRANSFUSION? Blood Transfusion Information  A transfusion is the replacement of blood or some of its parts. Blood is made up of multiple cells which provide different functions.  Red blood cells carry oxygen and are used for blood loss replacement.  White blood cells fight against infection.  Platelets control  bleeding.  Plasma helps clot blood.  Other blood products are available for specialized needs, such as hemophilia or other clotting disorders. BEFORE THE TRANSFUSION  Who gives blood for transfusions?   Healthy volunteers who are fully evaluated to make sure their blood is safe. This is blood bank blood. Transfusion therapy is the safest it  has ever been in the practice of medicine. Before blood is taken from a donor, a complete history is taken to make sure that person has no history of diseases nor engages in risky social behavior (examples are intravenous drug use or sexual activity with multiple partners). The donor's travel history is screened to minimize risk of transmitting infections, such as malaria. The donated blood is tested for signs of infectious diseases, such as HIV and hepatitis. The blood is then tested to be sure it is compatible with you in order to minimize the chance of a transfusion reaction. If you or a relative donates blood, this is often done in anticipation of surgery and is not appropriate for emergency situations. It takes many days to process the donated blood. RISKS AND COMPLICATIONS Although transfusion therapy is very safe and saves many lives, the main dangers of transfusion include:   Getting an infectious disease.  Developing a transfusion reaction. This is an allergic reaction to something in the blood you were given. Every precaution is taken to prevent this. The decision to have a blood transfusion has been considered carefully by your caregiver before blood is given. Blood is not given unless the benefits outweigh the risks. AFTER THE TRANSFUSION  Right after receiving a blood transfusion, you will usually feel much better and more energetic. This is especially true if your red blood cells have gotten low (anemic). The transfusion raises the level of the red blood cells which carry oxygen, and this usually causes an energy increase.  The nurse  administering the transfusion will monitor you carefully for complications. HOME CARE INSTRUCTIONS  No special instructions are needed after a transfusion. You may find your energy is better. Speak with your caregiver about any limitations on activity for underlying diseases you may have. SEEK MEDICAL CARE IF:   Your condition is not improving after your transfusion.  You develop redness or irritation at the intravenous (IV) site. SEEK IMMEDIATE MEDICAL CARE IF:  Any of the following symptoms occur over the next 12 hours:  Shaking chills.  You have a temperature by mouth above 102 F (38.9 C), not controlled by medicine.  Chest, back, or muscle pain.  People around you feel you are not acting correctly or are confused.  Shortness of breath or difficulty breathing.  Dizziness and fainting.  You get a rash or develop hives.  You have a decrease in urine output.  Your urine turns a dark color or changes to pink, red, or brown. Any of the following symptoms occur over the next 10 days:  You have a temperature by mouth above 102 F (38.9 C), not controlled by medicine.  Shortness of breath.  Weakness after normal activity.  The white part of the eye turns yellow (jaundice).  You have a decrease in the amount of urine or are urinating less often.  Your urine turns a dark color or changes to pink, red, or brown. Document Released: 09/25/2000 Document Revised: 12/21/2011 Document Reviewed: 05/14/2008 ExitCare Patient Information 2014 [place].  _______________________________________________________________________  Incentive Spirometer  An incentive spirometer is a tool that can help keep your lungs clear and active. This tool measures how well you are filling your lungs with each breath. Taking long deep breaths may help reverse or decrease the chance of developing breathing (pulmonary) problems (especially infection) following:  A long period of time when you are  unable to move or be active. BEFORE THE PROCEDURE   If the spirometer includes an indicator to  show your best effort, your nurse or respiratory therapist will set it to a desired goal.  If possible, sit up straight or lean slightly forward. Try not to slouch.  Hold the incentive spirometer in an upright position. INSTRUCTIONS FOR USE  1. Sit on the edge of your bed if possible, or sit up as far as you can in bed or on a chair. 2. Hold the incentive spirometer in an upright position. 3. Breathe out normally. 4. Place the mouthpiece in your mouth and seal your lips tightly around it. 5. Breathe in slowly and as deeply as possible, raising the piston or the ball toward the top of the column. 6. Hold your breath for 3-5 seconds or for as long as possible. Allow the piston or ball to fall to the bottom of the column. 7. Remove the mouthpiece from your mouth and breathe out normally. 8. Rest for a few seconds and repeat Steps 1 through 7 at least 10 times every 1-2 hours when you are awake. Take your time and take a few normal breaths between deep breaths. 9. The spirometer may include an indicator to show your best effort. Use the indicator as a goal to work toward during each repetition. 10. After each set of 10 deep breaths, practice coughing to be sure your lungs are clear. If you have an incision (the cut made at the time of surgery), support your incision when coughing by placing a pillow or rolled up towels firmly against it. Once you are able to get out of bed, walk around indoors and cough well. You may stop using the incentive spirometer when instructed by your caregiver.  RISKS AND COMPLICATIONS  Take your time so you do not get dizzy or light-headed.  If you are in pain, you may need to take or ask for pain medication before doing incentive spirometry. It is harder to take a deep breath if you are having pain. AFTER USE  Rest and breathe slowly and easily.  It can be helpful to  keep track of a log of your progress. Your caregiver can provide you with a simple table to help with this. If you are using the spirometer at home, follow these instructions: [place] IF:   You are having difficultly using the spirometer.  You have trouble using the spirometer as often as instructed.  Your pain medication is not giving enough relief while using the spirometer.  You develop fever of 100.5 F (38.1 C) or higher. SEEK IMMEDIATE MEDICAL CARE IF:   You cough up bloody sputum that had not been present before.  You develop fever of 102 F (38.9 C) or greater.  You develop worsening pain at or near the incision site. MAKE SURE YOU:   Understand these instructions.  Will watch your condition.  Will get help right away if you are not doing well or get worse. Document Released: 02/08/2007 Document Revised: 12/21/2011 Document Reviewed: 04/11/2007 [facility] Patient Information 2014 [place], [place].   ________________________________________________________________________

## 2018-02-18 NOTE — Progress Notes (Signed)
Patient instructed to do light diet the day before surgery.  Patient is aware to avoid carbonated beverages and raw fruits and vegetables and beans.  Patient voiced understanding.

## 2018-02-18 NOTE — Progress Notes (Signed)
EKG-01/01/2018-epic- Sinus Tach  02/11/2018-epic-labs- CBC/DIFF/CMP/CA125 CXR- 12/31/2017- abnormal  ECHO-2018-epic- EF 60-65 %  Stress Test- 12/29/2016- epic

## 2018-02-18 NOTE — Patient Instructions (Addendum)
Stop taking tumeric and ELIQUIS now. No lovenox bridge is needed.  Your pre-operative appointment will be at [facility] on Feb 21, 2018 at [facility] at 1:45pm.               Preparing for your Surgery  Plan for surgery on Feb 22, 2018 with Dr. Everitt Amber at [place] will be scheduled for a robotic assisted bilateral salpingo-oophorectomy, possible robotic assisted total hysterectomy, possible staging, possible laparotomy.   Pre-operative Testing -You will receive a phone call from presurgical testing at [facility] to arrange for a pre-operative testing appointment before your surgery.  This appointment normally occurs one to two weeks before your scheduled surgery.   -Bring your insurance card, copy of an advanced directive if applicable, medication list  -At that visit, you will be asked to sign a consent for a possible blood transfusion in case a transfusion becomes necessary during surgery.  The need for a blood transfusion is rare but having consent is a necessary part of your care.    Day Before Surgery at [place] will be asked to take in a light diet the day before surgery.  Avoid carbonated beverages.  You will be advised to have nothing to eat or drink after midnight the evening before.    Eat a light diet the day before surgery.  Examples including soups, broths, toast, yogurt, mashed potatoes.  Things to avoid include carbonated beverages (fizzy beverages), raw fruits and raw vegetables, or beans.   If your bowels are filled with gas, your surgeon will have difficulty visualizing your pelvic organs which increases your surgical risks.  Your role in recovery Your role is to become active as soon as directed by your doctor, while still giving yourself time to heal.  Rest when you feel tired. You will be asked to do the following in order to speed your recovery:  - Cough and breathe deeply. This helps toclear and expand your lungs and can prevent  pneumonia. You may be given a spirometer to practice deep breathing. A staff member will show you how to use the spirometer. - Do mild physical activity. Walking or moving your legs help your circulation and body functions return to normal. A staff member will help you when you try to walk and will provide you with simple exercises. Do not try to get up or walk alone the first time. - Actively manage your pain. Managing your pain lets you move in comfort. We will ask you to rate your pain on a scale of zero to 10. It is your responsibility to tell your doctor or nurse where and how much you hurt so your pain can be treated.  Special Considerations -If you are diabetic, you may be placed on insulin after surgery to have closer control over your blood sugars to promote healing and recovery.  This does not mean that you will be discharged on insulin.  If applicable, your oral antidiabetics will be resumed when you are tolerating a solid diet.  -Your final pathology results from surgery should be available by the Friday after surgery and the results will be relayed to you when available.  -Dr. Lahoma Crocker is the Surgeon that assists your GYN Oncologist with surgery.  The next day after your surgery you will either see your GYN Oncologist or Dr. Lahoma Crocker.   Blood Transfusion Information WHAT IS A BLOOD TRANSFUSION? A transfusion is the replacement of blood or some  of its parts. Blood is made up of multiple cells which provide different functions.  Red blood cells carry oxygen and are used for blood loss replacement.  White blood cells fight against infection.  Platelets control bleeding.  Plasma helps clot blood.  Other blood products are available for specialized needs, such as hemophilia or other clotting disorders. BEFORE THE TRANSFUSION  Who gives blood for transfusions?   You may be able to donate blood to be used at a later date on yourself (autologous  donation).  Relatives can be asked to donate blood. This is generally not any safer than if you have received blood from a stranger. The same precautions are taken to ensure safety when a relative's blood is donated.  Healthy volunteers who are fully evaluated to make sure their blood is safe. This is blood bank blood. Transfusion therapy is the safest it has ever been in the practice of medicine. Before blood is taken from a donor, a complete history is taken to make sure that person has no history of diseases nor engages in risky social behavior (examples are intravenous drug use or sexual activity with multiple partners). The donor's travel history is screened to minimize risk of transmitting infections, such as malaria. The donated blood is tested for signs of infectious diseases, such as HIV and hepatitis. The blood is then tested to be sure it is compatible with you in order to minimize the chance of a transfusion reaction. If you or a relative donates blood, this is often done in anticipation of surgery and is not appropriate for emergency situations. It takes many days to process the donated blood. RISKS AND COMPLICATIONS Although transfusion therapy is very safe and saves many lives, the main dangers of transfusion include:   Getting an infectious disease.  Developing a transfusion reaction. This is an allergic reaction to something in the blood you were given. Every precaution is taken to prevent this. The decision to have a blood transfusion has been considered carefully by your caregiver before blood is given. Blood is not given unless the benefits outweigh the risks.

## 2018-02-21 ENCOUNTER — Ambulatory Visit (HOSPITAL_COMMUNITY)
Admission: RE | Admit: 2018-02-21 | Discharge: 2018-02-21 | Disposition: A | Discharge status: Home / Self Care | Payer: BC Managed Care – PPO | Source: Ambulatory Visit | Attending: Anesthesiology | Admitting: Anesthesiology

## 2018-02-21 ENCOUNTER — Encounter (HOSPITAL_COMMUNITY): Payer: Self-pay

## 2018-02-21 ENCOUNTER — Other Ambulatory Visit: Payer: Self-pay

## 2018-02-21 ENCOUNTER — Encounter (HOSPITAL_COMMUNITY)
Admission: RE | Admit: 2018-02-21 | Discharge: 2018-02-21 | Disposition: A | Discharge status: Home / Self Care | Payer: BC Managed Care – PPO | Source: Ambulatory Visit | Attending: Gynecologic Oncology | Admitting: Gynecologic Oncology

## 2018-02-21 DIAGNOSIS — Z01818 Encounter for other preprocedural examination: Secondary | ICD-10-CM | POA: Insufficient documentation

## 2018-02-21 DIAGNOSIS — Z01812 Encounter for preprocedural laboratory examination: Secondary | ICD-10-CM | POA: Insufficient documentation

## 2018-02-21 HISTORY — DX: Other specified postprocedural states: R11.2

## 2018-02-21 HISTORY — DX: Other specified postprocedural states: Z98.890

## 2018-02-21 HISTORY — DX: Obstructive sleep apnea (adult) (pediatric): G47.33

## 2018-02-21 HISTORY — DX: Stress incontinence (female) (male): N39.3

## 2018-02-21 HISTORY — DX: Unspecified osteoarthritis, unspecified site: M19.90

## 2018-02-21 LAB — URINALYSIS, ROUTINE W REFLEX MICROSCOPIC
Glucose, UA: NEGATIVE mg/dL
Hgb urine dipstick: NEGATIVE
Ketones, ur: 5 mg/dL — AB
Leukocytes, UA: NEGATIVE
Nitrite: NEGATIVE
Protein, ur: 100 mg/dL — AB
Specific Gravity, Urine: 1.042 — ABNORMAL HIGH (ref 1.005–1.030)
pH: 5 (ref 5.0–8.0)

## 2018-02-21 LAB — COMPREHENSIVE METABOLIC PANEL
ALT: 27 U/L (ref 14–54)
AST: 26 U/L (ref 15–41)
Albumin: 3.7 g/dL (ref 3.5–5.0)
Alkaline Phosphatase: 97 U/L (ref 38–126)
Anion gap: 8 (ref 5–15)
BUN: 20 mg/dL (ref 6–20)
CO2: 28 mmol/L (ref 22–32)
CREATININE: 0.74 mg/dL (ref 0.44–1.00)
Calcium: 9 mg/dL (ref 8.9–10.3)
Chloride: 105 mmol/L (ref 101–111)
GFR calc non Af Amer: >60 mL/min (ref >60)
Glucose, Bld: 103 mg/dL — ABNORMAL HIGH (ref 65–99)
Potassium: 4.3 mmol/L (ref 3.5–5.1)
SODIUM: 141 mmol/L (ref 135–145)
Total Bilirubin: 0.3 mg/dL (ref 0.3–1.2)
Total Protein: 6.3 g/dL — ABNORMAL LOW (ref 6.5–8.1)

## 2018-02-21 LAB — CBC
HCT: 41.4 % (ref 36.0–46.0)
Hemoglobin: 13.5 g/dL (ref 12.0–15.0)
MCH: 30.4 pg (ref 26.0–34.0)
MCHC: 32.6 g/dL (ref 30.0–36.0)
MCV: 93.2 fL (ref 78.0–100.0)
PLATELETS: 250 10*3/uL (ref 150–400)
RBC: 4.44 MIL/uL (ref 3.87–5.11)
RDW: 13.3 % (ref 11.5–15.5)
WBC: 6.4 10*3/uL (ref 4.0–10.5)

## 2018-02-21 LAB — PROTIME-INR
INR: 0.98
PROTHROMBIN TIME: 12.9 s (ref 11.4–15.2)

## 2018-02-21 LAB — ABO/RH: ABO/RH(D): B POS

## 2018-02-21 NOTE — Progress Notes (Signed)
EKG dated 01-01-2018 in epic. Lab results from 02-21-2018 pending , pt had PAT appt. @ 1400 (cbc,cmp, pt/inr,UA,t&s) and CXR pending.

## 2018-02-22 ENCOUNTER — Ambulatory Visit (HOSPITAL_COMMUNITY)
Admission: RE | Admit: 2018-02-22 | Discharge: 2018-02-23 | Disposition: A | Discharge status: Home / Self Care | Payer: BC Managed Care – PPO | Source: Ambulatory Visit | Attending: Gynecologic Oncology | Admitting: Gynecologic Oncology

## 2018-02-22 ENCOUNTER — Other Ambulatory Visit: Payer: Self-pay

## 2018-02-22 ENCOUNTER — Ambulatory Visit (HOSPITAL_COMMUNITY): Payer: BC Managed Care – PPO | Admitting: Anesthesiology

## 2018-02-22 ENCOUNTER — Encounter (HOSPITAL_COMMUNITY): Payer: Self-pay | Admitting: *Deleted

## 2018-02-22 ENCOUNTER — Encounter (HOSPITAL_COMMUNITY)
Admission: RE | Disposition: A | Discharge status: Home / Self Care | Payer: Self-pay | Source: Ambulatory Visit | Attending: Gynecologic Oncology

## 2018-02-22 DIAGNOSIS — D271 Benign neoplasm of left ovary: Secondary | ICD-10-CM | POA: Insufficient documentation

## 2018-02-22 DIAGNOSIS — Z91048 Other nonmedicinal substance allergy status: Secondary | ICD-10-CM | POA: Insufficient documentation

## 2018-02-22 DIAGNOSIS — Z7951 Long term (current) use of inhaled steroids: Secondary | ICD-10-CM | POA: Diagnosis not present

## 2018-02-22 DIAGNOSIS — Z79899 Other long term (current) drug therapy: Secondary | ICD-10-CM | POA: Diagnosis not present

## 2018-02-22 DIAGNOSIS — Z86718 Personal history of other venous thrombosis and embolism: Secondary | ICD-10-CM | POA: Diagnosis not present

## 2018-02-22 DIAGNOSIS — R1900 Intra-abdominal and pelvic swelling, mass and lump, unspecified site: Secondary | ICD-10-CM | POA: Diagnosis not present

## 2018-02-22 DIAGNOSIS — Z888 Allergy status to other drugs, medicaments and biological substances status: Secondary | ICD-10-CM | POA: Diagnosis not present

## 2018-02-22 DIAGNOSIS — Z91040 Latex allergy status: Secondary | ICD-10-CM | POA: Diagnosis not present

## 2018-02-22 DIAGNOSIS — C561 Malignant neoplasm of right ovary: Secondary | ICD-10-CM | POA: Insufficient documentation

## 2018-02-22 DIAGNOSIS — Z800 Family history of malignant neoplasm of digestive organs: Secondary | ICD-10-CM | POA: Insufficient documentation

## 2018-02-22 DIAGNOSIS — N839 Noninflammatory disorder of ovary, fallopian tube and broad ligament, unspecified: Secondary | ICD-10-CM | POA: Diagnosis present

## 2018-02-22 DIAGNOSIS — Z86711 Personal history of pulmonary embolism: Secondary | ICD-10-CM | POA: Insufficient documentation

## 2018-02-22 DIAGNOSIS — Z9884 Bariatric surgery status: Secondary | ICD-10-CM | POA: Insufficient documentation

## 2018-02-22 DIAGNOSIS — G709 Myoneural disorder, unspecified: Secondary | ICD-10-CM | POA: Diagnosis not present

## 2018-02-22 DIAGNOSIS — Z7901 Long term (current) use of anticoagulants: Secondary | ICD-10-CM | POA: Diagnosis not present

## 2018-02-22 DIAGNOSIS — Z791 Long term (current) use of non-steroidal anti-inflammatories (NSAID): Secondary | ICD-10-CM | POA: Diagnosis not present

## 2018-02-22 DIAGNOSIS — Z87891 Personal history of nicotine dependence: Secondary | ICD-10-CM | POA: Insufficient documentation

## 2018-02-22 DIAGNOSIS — N83209 Unspecified ovarian cyst, unspecified side: Secondary | ICD-10-CM

## 2018-02-22 DIAGNOSIS — I739 Peripheral vascular disease, unspecified: Secondary | ICD-10-CM | POA: Diagnosis not present

## 2018-02-22 DIAGNOSIS — Z79891 Long term (current) use of opiate analgesic: Secondary | ICD-10-CM | POA: Diagnosis not present

## 2018-02-22 DIAGNOSIS — Z9221 Personal history of antineoplastic chemotherapy: Secondary | ICD-10-CM | POA: Insufficient documentation

## 2018-02-22 DIAGNOSIS — Z8572 Personal history of non-Hodgkin lymphomas: Secondary | ICD-10-CM | POA: Insufficient documentation

## 2018-02-22 HISTORY — PX: ROBOTIC ASSISTED SALPINGO OOPHERECTOMY: SHX6082

## 2018-02-22 LAB — TYPE AND SCREEN
ABO/RH(D): B POS
ANTIBODY SCREEN: NEGATIVE

## 2018-02-22 LAB — CREATININE, SERUM
CREATININE: 0.67 mg/dL (ref 0.44–1.00)
GFR calc Af Amer: >60 mL/min (ref >60)

## 2018-02-22 LAB — CBC
HCT: 40.3 % (ref 36.0–46.0)
HEMOGLOBIN: 13.3 g/dL (ref 12.0–15.0)
MCH: 30.6 pg (ref 26.0–34.0)
MCHC: 33 g/dL (ref 30.0–36.0)
MCV: 92.9 fL (ref 78.0–100.0)
Platelets: 182 10*3/uL (ref 150–400)
RBC: 4.34 MIL/uL (ref 3.87–5.11)
RDW: 13.2 % (ref 11.5–15.5)
WBC: 9.3 10*3/uL (ref 4.0–10.5)

## 2018-02-22 SURGERY — SALPINGO-OOPHORECTOMY, ROBOT-ASSISTED
Anesthesia: General

## 2018-02-22 MED ORDER — KCL IN DEXTROSE-NACL 20-5-0.45 MEQ/L-%-% IV SOLN
INTRAVENOUS | Status: DC
  Administered 2018-02-22 (×2): via INTRAVENOUS
  Filled 2018-02-22 (×2): qty 1000

## 2018-02-22 MED ORDER — ROCURONIUM BROMIDE 100 MG/10ML IV SOLN
INTRAVENOUS | Status: DC | PRN
  Administered 2018-02-22: 20 mg via INTRAVENOUS
  Administered 2018-02-22: 10 mg via INTRAVENOUS
  Administered 2018-02-22: 70 mg via INTRAVENOUS
  Administered 2018-02-22 (×2): 10 mg via INTRAVENOUS

## 2018-02-22 MED ORDER — PROPOFOL 10 MG/ML IV BOLUS
INTRAVENOUS | Status: AC
  Filled 2018-02-22: qty 20

## 2018-02-22 MED ORDER — FENTANYL CITRATE (PF) 100 MCG/2ML IJ SOLN
25.0000 ug | INTRAMUSCULAR | Status: DC | PRN
  Administered 2018-02-22 (×2): 50 ug via INTRAVENOUS

## 2018-02-22 MED ORDER — OXYCODONE HCL 5 MG PO TABS
5.0000 mg | ORAL_TABLET | ORAL | Status: DC | PRN
  Administered 2018-02-22 – 2018-02-23 (×5): 5 mg via ORAL
  Filled 2018-02-22 (×5): qty 1

## 2018-02-22 MED ORDER — DIPHENHYDRAMINE HCL 50 MG/ML IJ SOLN
INTRAMUSCULAR | Status: DC | PRN
  Administered 2018-02-22: 12.5 mg via INTRAVENOUS

## 2018-02-22 MED ORDER — ONDANSETRON HCL 4 MG PO TABS: 4.0000 mg | ORAL_TABLET | Freq: Four times a day (QID) | ORAL | Status: DC | PRN

## 2018-02-22 MED ORDER — CEFAZOLIN SODIUM-DEXTROSE 2-4 GM/100ML-% IV SOLN
2.0000 g | INTRAVENOUS | Status: AC
  Administered 2018-02-22: 2 g via INTRAVENOUS
  Filled 2018-02-22: qty 100

## 2018-02-22 MED ORDER — CELECOXIB 100 MG PO CAPS
100.0000 mg | ORAL_CAPSULE | Freq: Once | ORAL | Status: AC
  Administered 2018-02-22: 100 mg via ORAL
  Filled 2018-02-22: qty 1

## 2018-02-22 MED ORDER — MIDAZOLAM HCL 2 MG/2ML IJ SOLN
INTRAMUSCULAR | Status: AC
Start: 2018-02-22 — End: ?
  Filled 2018-02-22: qty 2

## 2018-02-22 MED ORDER — KETAMINE HCL 10 MG/ML IJ SOLN
INTRAMUSCULAR | Status: DC | PRN
  Administered 2018-02-22: 30 mg via INTRAVENOUS

## 2018-02-22 MED ORDER — ACETAMINOPHEN 500 MG PO TABS
1000.0000 mg | ORAL_TABLET | Freq: Four times a day (QID) | ORAL | Status: DC
  Administered 2018-02-22 – 2018-02-23 (×3): 1000 mg via ORAL
  Filled 2018-02-22 (×3): qty 2

## 2018-02-22 MED ORDER — ENOXAPARIN (LOVENOX) PATIENT EDUCATION KIT
PACK | Freq: Once | Status: AC
  Administered 2018-02-22: 22:00:00
  Filled 2018-02-22: qty 1

## 2018-02-22 MED ORDER — KETOROLAC TROMETHAMINE 30 MG/ML IJ SOLN: 15.0000 mg | Freq: Four times a day (QID) | INTRAMUSCULAR | Status: DC

## 2018-02-22 MED ORDER — ONDANSETRON HCL 4 MG/2ML IJ SOLN
INTRAMUSCULAR | Status: AC
  Filled 2018-02-22: qty 2

## 2018-02-22 MED ORDER — FENTANYL CITRATE (PF) 100 MCG/2ML IJ SOLN
INTRAMUSCULAR | Status: AC
  Filled 2018-02-22: qty 2

## 2018-02-22 MED ORDER — LIDOCAINE HCL (CARDIAC) PF 100 MG/5ML IV SOSY
PREFILLED_SYRINGE | INTRAVENOUS | Status: DC | PRN
  Administered 2018-02-22: 100 mg via INTRAVENOUS

## 2018-02-22 MED ORDER — ENOXAPARIN SODIUM 40 MG/0.4ML ~~LOC~~ SOLN
40.0000 mg | SUBCUTANEOUS | Status: DC
  Administered 2018-02-23: 40 mg via SUBCUTANEOUS
  Filled 2018-02-22: qty 0.4

## 2018-02-22 MED ORDER — ONDANSETRON HCL 4 MG/2ML IJ SOLN
4.0000 mg | Freq: Four times a day (QID) | INTRAMUSCULAR | Status: DC | PRN
  Administered 2018-02-23: 4 mg via INTRAVENOUS
  Filled 2018-02-22: qty 2

## 2018-02-22 MED ORDER — SCOPOLAMINE 1 MG/3DAYS TD PT72
1.0000 | MEDICATED_PATCH | TRANSDERMAL | Status: DC
  Administered 2018-02-22: 1.5 mg via TRANSDERMAL
  Filled 2018-02-22: qty 1

## 2018-02-22 MED ORDER — ACETAMINOPHEN 500 MG PO TABS
1000.0000 mg | ORAL_TABLET | ORAL | Status: AC
  Administered 2018-02-22: 1000 mg via ORAL
  Filled 2018-02-22: qty 2

## 2018-02-22 MED ORDER — CELECOXIB 200 MG PO CAPS
400.0000 mg | ORAL_CAPSULE | ORAL | Status: AC
  Administered 2018-02-22: 300 mg via ORAL
  Filled 2018-02-22 (×2): qty 2

## 2018-02-22 MED ORDER — KETAMINE HCL 10 MG/ML IJ SOLN
INTRAMUSCULAR | Status: AC
  Filled 2018-02-22: qty 1

## 2018-02-22 MED ORDER — SENNOSIDES-DOCUSATE SODIUM 8.6-50 MG PO TABS
2.0000 | ORAL_TABLET | Freq: Every day | ORAL | Status: DC
  Administered 2018-02-22: 2 via ORAL
  Filled 2018-02-22: qty 2

## 2018-02-22 MED ORDER — TRAZODONE HCL 50 MG PO TABS
125.0000 mg | ORAL_TABLET | Freq: Every day | ORAL | Status: DC
  Administered 2018-02-22: 125 mg via ORAL
  Filled 2018-02-22: qty 1

## 2018-02-22 MED ORDER — LACTATED RINGERS IV SOLN
INTRAVENOUS | Status: DC
  Administered 2018-02-22 (×2): via INTRAVENOUS

## 2018-02-22 MED ORDER — DULOXETINE HCL 60 MG PO CPEP: 90.0000 mg | ORAL_CAPSULE | Freq: Every day | ORAL | Status: DC

## 2018-02-22 MED ORDER — HYDROMORPHONE HCL 1 MG/ML IJ SOLN
0.2000 mg | INTRAMUSCULAR | Status: DC | PRN
  Administered 2018-02-22 – 2018-02-23 (×2): 0.6 mg via INTRAVENOUS
  Filled 2018-02-22 (×2): qty 1

## 2018-02-22 MED ORDER — LISDEXAMFETAMINE DIMESYLATE 50 MG PO CAPS: 70.0000 mg | ORAL_CAPSULE | Freq: Every day | ORAL | Status: DC

## 2018-02-22 MED ORDER — LACTATED RINGERS IR SOLN
Status: DC | PRN
  Administered 2018-02-22: 1000 mL

## 2018-02-22 MED ORDER — LIDOCAINE 2% (20 MG/ML) 5 ML SYRINGE
INTRAMUSCULAR | Status: DC | PRN
  Administered 2018-02-22: 1.5 mg/kg/h via INTRAVENOUS

## 2018-02-22 MED ORDER — LISDEXAMFETAMINE DIMESYLATE 70 MG PO CAPS: 70.0000 mg | ORAL_CAPSULE | Freq: Every day | ORAL | Status: DC

## 2018-02-22 MED ORDER — ROCURONIUM BROMIDE 10 MG/ML (PF) SYRINGE
PREFILLED_SYRINGE | INTRAVENOUS | Status: AC
  Filled 2018-02-22: qty 5

## 2018-02-22 MED ORDER — PROMETHAZINE HCL 25 MG/ML IJ SOLN
6.2500 mg | INTRAMUSCULAR | Status: DC | PRN
  Administered 2018-02-22: 6.25 mg via INTRAVENOUS

## 2018-02-22 MED ORDER — SCOPOLAMINE 1 MG/3DAYS TD PT72: 1.0000 | MEDICATED_PATCH | TRANSDERMAL | Status: DC

## 2018-02-22 MED ORDER — PHENYLEPHRINE 40 MCG/ML (10ML) SYRINGE FOR IV PUSH (FOR BLOOD PRESSURE SUPPORT)
PREFILLED_SYRINGE | INTRAVENOUS | Status: AC
  Filled 2018-02-22: qty 10

## 2018-02-22 MED ORDER — PROPOFOL 10 MG/ML IV BOLUS
INTRAVENOUS | Status: DC | PRN
  Administered 2018-02-22: 200 mg via INTRAVENOUS

## 2018-02-22 MED ORDER — PROMETHAZINE HCL 25 MG/ML IJ SOLN
INTRAMUSCULAR | Status: AC
  Filled 2018-02-22: qty 1

## 2018-02-22 MED ORDER — LIDOCAINE 2% (20 MG/ML) 5 ML SYRINGE
INTRAMUSCULAR | Status: AC
  Filled 2018-02-22: qty 20

## 2018-02-22 MED ORDER — SUGAMMADEX SODIUM 200 MG/2ML IV SOLN
INTRAVENOUS | Status: DC | PRN
  Administered 2018-02-22: 200 mg via INTRAVENOUS

## 2018-02-22 MED ORDER — MIDAZOLAM HCL 5 MG/5ML IJ SOLN
INTRAMUSCULAR | Status: DC | PRN
  Administered 2018-02-22: 2 mg via INTRAVENOUS

## 2018-02-22 MED ORDER — FENTANYL CITRATE (PF) 250 MCG/5ML IJ SOLN
INTRAMUSCULAR | Status: AC
  Filled 2018-02-22: qty 5

## 2018-02-22 MED ORDER — FENTANYL CITRATE (PF) 100 MCG/2ML IJ SOLN
INTRAMUSCULAR | Status: DC | PRN
  Administered 2018-02-22 (×2): 100 ug via INTRAVENOUS
  Administered 2018-02-22: 50 ug via INTRAVENOUS

## 2018-02-22 MED ORDER — DIAZEPAM 5 MG PO TABS: 2.5000 mg | ORAL_TABLET | Freq: Two times a day (BID) | ORAL | Status: DC | PRN

## 2018-02-22 MED ORDER — ONDANSETRON HCL 4 MG/2ML IJ SOLN
INTRAMUSCULAR | Status: DC | PRN
  Administered 2018-02-22: 4 mg via INTRAVENOUS

## 2018-02-22 MED ORDER — PREGABALIN 75 MG PO CAPS: 150.0000 mg | ORAL_CAPSULE | Freq: Every day | ORAL | Status: DC

## 2018-02-22 MED ORDER — DEXAMETHASONE SODIUM PHOSPHATE 4 MG/ML IJ SOLN
4.0000 mg | INTRAMUSCULAR | Status: AC
  Administered 2018-02-22: 10 mg via INTRAVENOUS

## 2018-02-22 MED ORDER — GABAPENTIN 300 MG PO CAPS
300.0000 mg | ORAL_CAPSULE | ORAL | Status: DC
  Filled 2018-02-22: qty 1

## 2018-02-22 SURGICAL SUPPLY — 54 items
BAG LAPAROSCOPIC 12 15 PORT 16 (BASKET) IMPLANT
BAG RETRIEVAL 12/15 (BASKET)
CHLORAPREP W/TINT 26ML (MISCELLANEOUS) ×2 IMPLANT
COVER BACK TABLE 60X90IN (DRAPES) ×2 IMPLANT
COVER TIP SHEARS 8 DVNC (MISCELLANEOUS) ×2 IMPLANT
COVER TIP SHEARS 8MM DA VINCI (MISCELLANEOUS) ×2
DERMABOND ADVANCED (GAUZE/BANDAGES/DRESSINGS) ×1
DERMABOND ADVANCED .7 DNX12 (GAUZE/BANDAGES/DRESSINGS) ×1 IMPLANT
DRAPE ARM DVNC X/XI (DISPOSABLE) ×4 IMPLANT
DRAPE COLUMN DVNC XI (DISPOSABLE) ×1 IMPLANT
DRAPE DA VINCI XI ARM (DISPOSABLE) ×4
DRAPE DA VINCI XI COLUMN (DISPOSABLE) ×1
DRAPE SHEET LG 3/4 BI-LAMINATE (DRAPES) ×2 IMPLANT
DRAPE SURG IRRIG POUCH 19X23 (DRAPES) ×2 IMPLANT
ELECT PENCIL ROCKER SW 15FT (MISCELLANEOUS) ×2 IMPLANT
ELECT REM PT RETURN 9FT ADLT (ELECTROSURGICAL) ×2
ELECTRODE REM PT RTRN 9FT ADLT (ELECTROSURGICAL) ×1 IMPLANT
GAUZE 4X4 16PLY RFD (DISPOSABLE) ×2 IMPLANT
GLOVE BIO SURGEON STRL SZ 6 (GLOVE) IMPLANT
GLOVE BIO SURGEON STRL SZ 6.5 (GLOVE) IMPLANT
GLOVE SURG SS PI 6.0 STRL IVOR (GLOVE) ×10 IMPLANT
GLOVE SURG SS PI 6.5 STRL IVOR (GLOVE) ×4 IMPLANT
GOWN STRL REUS W/ TWL LRG LVL3 (GOWN DISPOSABLE) ×3 IMPLANT
GOWN STRL REUS W/TWL LRG LVL3 (GOWN DISPOSABLE) ×3
HOLDER FOLEY CATH W/STRAP (MISCELLANEOUS) ×2 IMPLANT
IRRIG SUCT STRYKERFLOW 2 WTIP (MISCELLANEOUS) ×2
IRRIGATION SUCT STRKRFLW 2 WTP (MISCELLANEOUS) ×1 IMPLANT
MANIPULATOR UTERINE 4.5 ZUMI (MISCELLANEOUS) IMPLANT
MARKER SKIN DUAL TIP RULER LAB (MISCELLANEOUS) IMPLANT
OBTURATOR OPTICAL STANDARD 8MM (TROCAR) ×1
OBTURATOR OPTICAL STND 8 DVNC (TROCAR) ×1
OBTURATOR OPTICALSTD 8 DVNC (TROCAR) ×1 IMPLANT
OCCLUDER COLPOPNEUMO (BALLOONS) ×2 IMPLANT
PACK ROBOT GYN CUSTOM WL (TRAY / TRAY PROCEDURE) ×2 IMPLANT
PAD POSITIONING PINK XL (MISCELLANEOUS) ×2 IMPLANT
POUCH SPECIMEN RETRIEVAL 10MM (ENDOMECHANICALS) ×10 IMPLANT
SCISSORS LAP 5X35 DISP (ENDOMECHANICALS) ×2 IMPLANT
SEAL CANN UNIV 5-8 DVNC XI (MISCELLANEOUS) ×4 IMPLANT
SEAL XI 5MM-8MM UNIVERSAL (MISCELLANEOUS) ×4
SEALER VESSEL DA VINCI XI (MISCELLANEOUS) ×1
SEALER VESSEL EXT DVNC XI (MISCELLANEOUS) ×1 IMPLANT
SET TRI-LUMEN FLTR TB AIRSEAL (TUBING) ×2 IMPLANT
SOLUTION ELECTROLUBE (MISCELLANEOUS) IMPLANT
SUT MNCRL AB 4-0 PS2 18 (SUTURE) ×4 IMPLANT
SUT VIC AB 0 CT1 27 (SUTURE) ×1
SUT VIC AB 0 CT1 27XBRD ANTBC (SUTURE) ×1 IMPLANT
TOWEL OR NON WOVEN STRL DISP B (DISPOSABLE) ×2 IMPLANT
TRAP SPECIMEN MUCOUS 40CC (MISCELLANEOUS) ×2 IMPLANT
TRAY FOLEY CATH SILVER 16FR LF (SET/KITS/TRAYS/PACK) ×2 IMPLANT
TRAY FOLEY MTR SLVR 16FR STAT (SET/KITS/TRAYS/PACK) IMPLANT
TROCAR XCEL 12X100 BLDLESS (ENDOMECHANICALS) ×2 IMPLANT
UNDERPAD 30X30 (UNDERPADS AND DIAPERS) ×2 IMPLANT
WATER STERILE IRR 1000ML POUR (IV SOLUTION) ×2 IMPLANT
YANKAUER SUCT BULB TIP 10FT TU (MISCELLANEOUS) ×2 IMPLANT

## 2018-02-22 NOTE — Transfer of Care (Signed)
Immediate Anesthesia Transfer of Care Note  Patient: Joyce Payne  Procedure(s) Performed: XI ROBOTIC ASSISTED HYSTERECTOMY, BILATERAL  SALPINGO OOPHORECTOMY, OMENTECTOMY; LYMPHADNECTOMY; PERITONEAL BIOPSIES (N/A )  Patient Location: PACU  Anesthesia Type:General  Level of Consciousness: awake, alert  and oriented  Airway & Oxygen Therapy: Patient Spontanous Breathing and Patient connected to face mask oxygen  Post-op Assessment: Report given to RN and Post -op Vital signs reviewed and stable  Post vital signs: Reviewed and stable  Last Vitals:  Vitals Value Taken Time  BP    Temp    Pulse 60 02/22/2018  1:24 PM  Resp 19 02/22/2018  1:24 PM  SpO2 100 % 02/22/2018  1:24 PM  Vitals shown include unvalidated device data.  Last Pain:  Vitals:   02/22/18 0853  TempSrc:   PainSc: 0-No pain         Complications: No apparent anesthesia complications

## 2018-02-22 NOTE — Anesthesia Postprocedure Evaluation (Addendum)
Anesthesia Post Note  Patient: Joyce Payne  Procedure(s) Performed: XI ROBOTIC ASSISTED HYSTERECTOMY, BILATERAL  SALPINGO OOPHORECTOMY, OMENTECTOMY; LYMPHADNECTOMY; PERITONEAL BIOPSIES (N/A )     Patient location during evaluation: PACU Anesthesia Type: General Level of consciousness: sedated Pain management: pain level controlled Vital Signs Assessment: post-procedure vital signs reviewed and stable Respiratory status: spontaneous breathing and respiratory function stable Cardiovascular status: stable Postop Assessment: no apparent nausea or vomiting Anesthetic complications: yes Anesthetic complication details: PONV   Last Vitals:  Vitals:   02/22/18 1430 02/22/18 1445  BP: 127/63 124/76  Pulse: 64 65  Resp: 16 17  Temp:  36.4 C  SpO2: 100% 98%    Last Pain:  Vitals:   02/22/18 1430  TempSrc:   PainSc: 6                  Travon Crochet DANIEL

## 2018-02-22 NOTE — Anesthesia Procedure Notes (Addendum)
Procedure Name: Intubation Date/Time: 02/22/2018 10:09 AM Performed by: Glory Buff, CRNA Pre-anesthesia Checklist: Patient identified Patient Re-evaluated:Patient Re-evaluated prior to induction Oxygen Delivery Method: Circle system utilized Preoxygenation: Pre-oxygenation with 100% oxygen Induction Type: IV induction Ventilation: Mask ventilation without difficulty Laryngoscope Size: Mac and 3 Grade View: Grade I Tube type: Oral Tube size: 7.0 mm Number of attempts: 1 Airway Equipment and Method: Stylet Placement Confirmation: ETT inserted through vocal cords under direct vision Secured at: 20 cm Tube secured with: Tape

## 2018-02-22 NOTE — Addendum Note (Signed)
Addendum  created 02/22/18 1457 by Duane Boston, MD   Sign clinical note

## 2018-02-22 NOTE — Addendum Note (Signed)
Addendum  created 02/22/18 1548 by Glory Buff, CRNA   Charge Capture section accepted

## 2018-02-22 NOTE — Discharge Instructions (Signed)
02/22/2018  Return to work: 4 weeks  Activity: 1. Be up and out of the bed during the day.  Take a nap if needed.  You may walk up steps but be careful and use the hand rail.  Stair climbing will tire you more than you think, you may need to stop part way and rest.   2. No lifting or straining for 6 weeks.  3. No driving for 1 weeks.  Do Not drive if you are taking narcotic pain medicine.  4. Shower daily.  Use soap and water on your incision and pat dry; don't rub.   5. No sexual activity and nothing in the vagina for 8 weeks.  Medications:  - Take ibuprofen and tylenol first line for pain control. Take these regularly (every 6 hours) to decrease the build up of pain.  - If necessary, for severe pain not relieved by ibuprofen, take percocet.  - While taking percocet you should take sennakot every night to reduce the likelihood of constipation. If this causes diarrhea, stop its use.  - Use lovenox once daily for 1 week postop. After that week (03/01/18) it is safe to resume your eloquis (and stop the lovenox shots the same day).   Diet: 1. Low sodium Heart Healthy Diet is recommended.  2. It is safe to use a laxative if you have difficulty moving your bowels.   Wound Care: 1. Keep clean and dry.  Shower daily.  Reasons to call the Doctor:   Fever - Oral temperature greater than 100.4 degrees Fahrenheit  Foul-smelling vaginal discharge  Difficulty urinating  Nausea and vomiting  Increased pain at the site of the incision that is unrelieved with pain medicine.  Difficulty breathing with or without chest pain  New calf pain especially if only on one side  Sudden, continuing increased vaginal bleeding with or without clots.   Follow-up: 1. See Everitt Amber in 3 weeks.  Contacts: For questions or concerns you should contact:  Dr. Everitt Amber at [phone] After hours and on week-ends call [phone] and ask to speak to the physician on call for Gynecologic  Oncology

## 2018-02-22 NOTE — Op Note (Signed)
OPERATIVE NOTE 02/22/18  Surgeon: Donaciano Eva   Assistants: Dr Lahoma Crocker (an MD assistant was necessary for tissue manipulation, management of robotic instrumentation, retraction and positioning due to the complexity of the case and hospital policies).   Anesthesia: General endotracheal anesthesia  ASA Class: 3   Pre-operative Diagnosis: right ovarian mass  Post-operative Diagnosis: same, "at least borderline" tumor of the ovary  Operation: Robotic-assisted laparoscopic total hysterectomy with bilateral salpingoophorectomy, omentectomy, pelvic lymphadenectomy, peritoneal biopsies  Surgeon: Donaciano Eva  Assistant Surgeon: Lahoma Crocker MD  Anesthesia: GET  Urine Output: 300  Operative Findings:  : 10cm bilobed right ovarian mass, nonadherent to adjacent structures. Grossly normal peritoneum otherwise, no ascites, no diaphragmatic disease. Normal  Omentum. Normal left tube and ovary. 6cm normal appearing uterus.   Frozen section revealed "at least serous borderline tumor"  Estimated Blood Loss:  less than 50 mL      Total IV Fluids: 800 ml         Specimens: uterus, cervix, bilateral tubes and ovaries, bilateral pelvic lymph nodes, bilateral pelvic peritoneum biopsies, bilateral abdominal peritoneum biopsies, anterior pelvic peritoneum biopsy, omentum.         Complications:  None; patient tolerated the procedure well.         Disposition: PACU - hemodynamically stable.  Procedure Details  The patient was seen in the Holding Room. The risks, benefits, complications, treatment options, and expected outcomes were discussed with the patient.  The patient concurred with the proposed plan, giving informed consent.  The site of surgery properly noted/marked. The patient hysterectomy with bilateral salpingo oophorectomy with possible staging. A Time Out was held and the above information confirmed.  After induction of anesthesia, the patient was draped  and prepped in the usual sterile manner. Pt was placed in supine position after anesthesia and draped and prepped in the usual sterile manner. The abdominal drape was placed after the CholoraPrep had been allowed to dry for 3 minutes.  Her arms were tucked to her side with all appropriate precautions.  The shoulders were stabilized with padded shoulder blocks applied to the acromium processes.  The patient was placed in the semi-lithotomy position in [place].  The perineum was prepped with Betadine. The patient was then prepped. Foley catheter was placed.  A uterine manipulator could not be placed due to the narrowness of the vagina.  OG tube placement was confirmed and to suction.   Next, a 5 mm skin incision was made 1 cm below the subcostal margin in the midclavicular line.  The 5 mm Optiview port and scope was used for direct entry.  Opening pressure was under 10 mm CO2.  The abdomen was insufflated and the findings were noted as above.   At this point and all points during the procedure, the patient's intra-abdominal pressure did not exceed 15 mmHg. Next, a 10 mm skin incision was made 2cm above the umbilicus at the site of her prior incision and a right and left port was placed about 10 cm lateral to the robot port on the right and left side.  A fourth arm was placed in the left lower quadrant 2 cm above and superior and medial to the anterior superior iliac spine.  Prior to placement of the midline port, sharp adhesiolysis freed some adhesions between the falciform ligament and the anterior abdominal wall. All ports were placed under direct visualization.  The patient was placed in steep Trendelenburg.  Bowel was folded away into the upper  abdomen.  The robot was docked in the normal manner.  Washings were obtained.   The right and left peritoneum were opened parallel to the IP ligament to open the retroperitoneal spaces bilaterally. The right side wall peritoneum was retracted and the pararectal  space was opened to identify the ureter. A window was created in the ligament above the ureter and the ovarian vessels and uteroovarian vessels were sealed and ligated. The specimen was placed in an endocatch bag. A similar procedure was performed to remove and separate the left ovary which was also placed in a bag.  Copious irrigation of the abdomen and pelvis confirmed hemostasis at all surgical sites.  The robot was undocked. The bags were delivered through the left upper quadrant incision. Due to the size of the right ovarian bag, the LUQ incision was extended to 3cm and the fascial incision was also slightly extended. This facilitated delivery of the bag to the skin where it was opened, and while contained within the bag the ovarian cyst was incised and drained of clear fluid. It was then able to be delivered through the incision and sent for frozen section.  The frozen returned "at least borderline, serous". Therefore a staging procedure was performed.  The robot was redocked.   An EEA sizer was placed in the vagina to demarcate the cervicovaginal junction. The hysterectomy was started after the round ligament on the right side was incised and the retroperitoneum was entered and the pararectal space was developed.  The ureter was again noted to be on the medial leaf of the broad ligament.  The posterior peritoneum was taken down to the level of the EEA sizer.  The anterior peritoneum was also taken down.  The bladder flap was created to the level of the KOH ring.  The uterine artery on the right side was skeletonized, cauterized and cut in the normal manner.  A similar procedure was performed on the left.  The colpotomy was made and the uterus, cervix were amputated and delivered through the vagina.  Pedicles were inspected and excellent hemostasis was achieved.    The paravesical space was developed with monopolar and sharp dissection. It was held open with tension on the median umbilical ligament  with the forth arm. The paravesical space was opened with blunt and sharp dissection to mobilize the ureter off of the medial surface of the internal iliac artery. The medial leaf of the broad ligament containing the ureter was held medially (opening the pararectal space) by the assistant's grasper. The right pelvic lymphadenectomy was performed by skeletonizing the internal iliac artery at the bifurcation with the external iliac artery. The obturator nerve was identified in the base of lateral paravesical space. The ureter was mobilized medially off of the dissection by developing the pararectal space. The genitofemoral nerve was identified, skeletonized and mobilized laterally off of the external iliac artery. An enbloc resection of lymph nodes was performed within the following boundaries: the mid portion of the common iliac proximally, the circumflex iliac vein distally, the obturator nerve posteriorally, the genitofemoral nerve laterally. The nodal basin (including obturator space) were confirmed to be empty of nodes and hemostatic. The nodes were placed in an endocatch bag and retrieved vaginally.   The left pelvic lymphadenectomy was then performed. The paravesical space was developed with monopolar and sharp dissection. It was held open with tension on the median umbilical ligament with the forth arm. The paravesical space was opened with blunt and sharp dissection to mobilize the ureter  off of the medial surface of the internal iliac artery. The medial leaf of the broad ligament containing the ureter was held medially (opening the pararectal space) by the assistant's grasper. The left pelvic lymphadenectomy was performed by skeletonizing the internal iliac artery at the bifurcation with the external iliac artery. The obturator nerve was identified in the base of lateral paravesical space. The ureter was mobilized medially off of the dissection by developing the pararectal space. The genitofemoral nerve  was identified, skeletonized and mobilized laterally off of the external iliac artery. An enbloc resection of lymph nodes was performed within the following boundaries: the mid portion of the common iliac proximally, the circumflex iliac vein distally, the obturator nerve posteriorally, the genitofemoral nerve laterally. The nodal basin (including obturator space) were confirmed to be empty of nodes and hemostatic. The nodes were placed in an endocatch bag and retrieved vaginally.  Peritoneal biopsies (representative of normal appearing tissues) were taken from the anterior, left and right pelvic peritoneum and the mid left and right abdominal peritoneum.  The omentectomy was then performed. The omentum was delivered into the mid abdomen and elevated. The parietal peritonum that attaches the omentum to the transverse colon was incised facilitating separation of the mid portion of the omentum from the transverse colon. The right sided omentum with its vascular pedicles was then skeletonized in its attachments to the right transverse colon. These vascular pedicles were bipolar sealed and transected. Observation of the colonic wall occurred throughout. The dissection then moved progressively along the colon the the left splenic flexure of the transverse colon. The bipolar sealing forceps and the scissors were used to skeletonize vascular omental pedicles, seal them and transect them until the entire infracolic omentum had been freed from its transverse colonic attachments to the splenic flexure. The omentum was then placed in an endocatch bag and retrieved from the vagina.  The colpotomy at the vaginal cuff was closed with Vicryl on a CT1 needle in running manner.  Irrigation was used and excellent hemostasis was achieved.  At this point in the procedure was completed.  Robotic instruments were removed under direct visulaization.  The robot was undocked. The fascia was closed at the left upper quadrant port with  0-vicryl in a running fascion. The supraumbilical port site was closed with Vicryl on a UR-5 needle and the fascia was closed with 0 Vicryl on a UR-5 needle.  The skin was closed with 4-0 Vicryl in a subcuticular manner.  Dermabond was applied.  Sponge, lap and needle counts correct x 2.    The vagina was swabbed with  minimal bleeding noted.   All instrument and needle counts were correct x  3.   The patient was transferred to the recovery room in a stable condition.  Donaciano Eva, MD

## 2018-02-22 NOTE — Anesthesia Preprocedure Evaluation (Addendum)
Anesthesia Evaluation  Patient identified by MRN, date of birth, ID band Patient awake    Reviewed: Allergy & Precautions, NPO status , Patient's Chart, lab work & pertinent test results  History of Anesthesia Complications (+) PONV, POST - OP SPINAL HEADACHE and history of anesthetic complications  Airway Mallampati: II  TM Distance: >3 FB Neck ROM: Full    Dental no notable dental hx. (+) Dental Advisory Given   Pulmonary sleep apnea , former smoker,    Pulmonary exam normal        Cardiovascular negative cardio ROS Normal cardiovascular exam     Neuro/Psych PSYCHIATRIC DISORDERS Anxiety Depression    GI/Hepatic Neg liver ROS, GERD  ,  Endo/Other  Morbid obesity  Renal/GU negative Renal ROS     Musculoskeletal   Abdominal   Peds  Hematology  (+) Blood dyscrasia, , Hodgkin's lymphoma, nodular sclerosis   Anesthesia Other Findings   Reproductive/Obstetrics                            Anesthesia Physical Anesthesia Plan  ASA: III  Anesthesia Plan: General   Post-op Pain Management:    Induction: Intravenous  PONV Risk Score and Plan: 4 or greater and Ondansetron, Dexamethasone, Scopolamine patch - Pre-op and Diphenhydramine  Airway Management Planned: Oral ETT  Additional Equipment:   Intra-op Plan:   Post-operative Plan: Extubation in OR  Informed Consent: I have reviewed the patients History and Physical, chart, labs and discussed the procedure including the risks, benefits and alternatives for the proposed anesthesia with the patient or authorized representative who has indicated his/her understanding and acceptance.   Dental advisory given  Plan Discussed with: CRNA and Anesthesiologist  Anesthesia Plan Comments:        Anesthesia Quick Evaluation

## 2018-02-22 NOTE — Interval H&P Note (Signed)
History and Physical Interval Note:  02/22/2018 9:13 AM  [name]  has presented today for surgery, with the diagnosis of ovarian cyst  The various methods of treatment have been discussed with the patient and family. After consideration of risks, benefits and other options for treatment, the patient has consented to  Procedure(s): XI ROBOTIC ASSISTED BILATERAL  SALPINGO OOPHORECTOMY WITH POSSIBLE TOTAL HYSTERECTOMY, POSSIBLE STAGING, POSSIBLE LAPAROTOMY (Bilateral) as a surgical intervention .  The patient's history has been reviewed, patient examined, no change in status, stable for surgery.  Patient has stopped her eloquis 3 days previously. I have reviewed the patient's chart and labs.  Questions were answered to the patient's satisfaction.     Thereasa Solo

## 2018-02-23 DIAGNOSIS — C561 Malignant neoplasm of right ovary: Secondary | ICD-10-CM | POA: Diagnosis not present

## 2018-02-23 LAB — BASIC METABOLIC PANEL
Anion gap: 9 (ref 5–15)
BUN: 14 mg/dL (ref 6–20)
CO2: 26 mmol/L (ref 22–32)
Calcium: 8.8 mg/dL — ABNORMAL LOW (ref 8.9–10.3)
Chloride: 107 mmol/L (ref 101–111)
Creatinine, Ser: 0.68 mg/dL (ref 0.44–1.00)
GFR calc Af Amer: >60 mL/min (ref >60)
GFR calc non Af Amer: >60 mL/min (ref >60)
GLUCOSE: 140 mg/dL — AB (ref 65–99)
Potassium: 4 mmol/L (ref 3.5–5.1)
SODIUM: 142 mmol/L (ref 135–145)

## 2018-02-23 LAB — CBC
HCT: 36.2 % (ref 36.0–46.0)
Hemoglobin: 11.9 g/dL — ABNORMAL LOW (ref 12.0–15.0)
MCH: 30.5 pg (ref 26.0–34.0)
MCHC: 32.9 g/dL (ref 30.0–36.0)
MCV: 92.8 fL (ref 78.0–100.0)
PLATELETS: 219 10*3/uL (ref 150–400)
RBC: 3.9 MIL/uL (ref 3.87–5.11)
RDW: 13.3 % (ref 11.5–15.5)
WBC: 9.8 10*3/uL (ref 4.0–10.5)

## 2018-02-23 MED ORDER — ENOXAPARIN SODIUM 40 MG/0.4ML ~~LOC~~ SOLN: 40.0000 mg | SUBCUTANEOUS | 0 refills | Status: DC

## 2018-02-23 MED ORDER — ACETAMINOPHEN 500 MG PO TABS: 1000.0000 mg | ORAL_TABLET | Freq: Four times a day (QID) | ORAL | 0 refills | Status: DC

## 2018-02-23 MED ORDER — OXYCODONE HCL 5 MG PO TABS: 5.0000 mg | ORAL_TABLET | ORAL | 0 refills | Status: DC | PRN

## 2018-02-23 MED ORDER — SENNOSIDES-DOCUSATE SODIUM 8.6-50 MG PO TABS
2.0000 | ORAL_TABLET | Freq: Every day | ORAL | 1 refills | Status: DC
Start: 2018-02-23 — End: 2018-04-20

## 2018-02-23 MED ORDER — ENOXAPARIN (LOVENOX) PATIENT EDUCATION KIT
PACK | Freq: Once | Status: DC
  Filled 2018-02-23: qty 1

## 2018-02-23 NOTE — Discharge Summary (Signed)
Physician Discharge Summary  Patient ID: Joyce Payne MRN: [id number] DOB/AGE: Nov 10, 1960 57 y.o.  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 She underwent a robotic assisted total hysterectomy, BSO, pelvic lymphadenectomy, omentectomy, peritoneal biopsies for an "at least serous borderline" tumor of the ovary. 2/ surgery was uncomplicated  3/ on postoperative day 1 the patient was meeting discharge criteria: tolerating PO, voiding urine, ambulating, pain well controlled on oral medications.  4/ new medications on discharge include lovenox for 1 week (40mg  q day), senakot, percocet, tylenol. She will hold her eloquis for 1 week and start this after stopping lovenox.  Consults: None  Significant Diagnostic Studies: labs:  CBC    Component Value Date/Time   WBC 9.8 02/23/2018 0424   RBC 3.90 02/23/2018 0424   HGB 11.9 (L) 02/23/2018 0424   HGB 14.0 01/11/2017 1114   HCT 36.2 02/23/2018 0424   HCT 41.5 01/11/2017 1114   PLT 219 02/23/2018 0424   PLT 241 01/11/2017 1114   MCV 92.8 02/23/2018 0424   MCV 93.2 01/11/2017 1114   MCH 30.5 02/23/2018 0424   MCHC 32.9 02/23/2018 0424   RDW 13.3 02/23/2018 0424   RDW 13.5 01/11/2017 1114   LYMPHSABS 2.3 02/11/2018 1450   LYMPHSABS 1.6 01/11/2017 1114   MONOABS 0.4 02/11/2018 1450   MONOABS 0.5 01/11/2017 1114   EOSABS 0.1 02/11/2018 1450   EOSABS 0.0 01/11/2017 1114   BASOSABS 0.0 02/11/2018 1450   BASOSABS 0.0 01/11/2017 1114     Treatments: surgery: see above  Discharge Exam: Blood pressure (!) 119/49, pulse 69, temperature 98.4 F (36.9 C), temperature source Oral, resp. rate 18, height 5\' 6"  (1.676 m), weight 214 lb 4.6 oz (97.2 kg), last  menstrual period 10/25/2012, SpO2 96 %. General appearance: alert and cooperative GI: soft, non-tender; bowel sounds normal; no masses,  no organomegaly and left abdominal wall pain but no palpable hematomas Incision/Wound: clean and in tact x 5  Disposition: Discharge disposition: 01-Home or Self Care       Discharge Instructions    (HEART FAILURE PATIENTS) Call MD:  Anytime you have any of the following symptoms: 1) 3 pound weight gain in 24 hours or 5 pounds in 1 week 2) shortness of breath, with or without a dry hacking cough 3) swelling in the hands, feet or stomach 4) if you have to sleep on extra pillows at night in order to breathe.   Complete by:  As directed    Call MD for:  difficulty breathing, headache or visual disturbances   Complete by:  As directed    Call MD for:  extreme fatigue   Complete by:  As directed    Call MD for:  hives   Complete by:  As directed    Call MD for:  persistant dizziness or light-headedness   Complete by:  As directed    Call MD for:  persistant nausea and vomiting   Complete by:  As directed    Call MD for:  redness, tenderness, or signs of infection (pain, swelling, redness, odor or green/yellow discharge around incision site)   Complete by:  As directed    Call MD for:  severe uncontrolled pain   Complete by:  As directed    Call MD for:  temperature >100.4   Complete by:  As directed    Diet - low sodium heart healthy   Complete by:  As directed    Diet general   Complete by:  As directed    Driving Restrictions   Complete by:  As directed    No driving for 7 days or until off narcotic pain medication   Increase activity slowly   Complete by:  As directed    Remove dressing in 24 hours   Complete by:  As directed    Sexual Activity Restrictions   Complete by:  As directed    No intercourse for 6 weeks     Allergies as of 02/23/2018      Reactions   Tape Other (See Comments)   TEARS THE SKIN (SKIN IS THIN!!)   Latex Rash    Povidone-iodine Rash      Medication List    TAKE these medications   acetaminophen 500 MG tablet Commonly known as:  TYLENOL Take 1,000 mg by mouth 2 (two) times daily as needed for mild pain or headache. What changed:  Another medication with the same name was added. Make sure you understand how and when to take each.   acetaminophen 500 MG tablet Commonly known as:  TYLENOL Take 2 tablets (1,000 mg total) by mouth every 6 (six) hours. What changed:  You were already taking a medication with the same name, and this prescription was added. Make sure you understand how and when to take each.   apixaban 5 MG Tabs tablet Commonly known as:  ELIQUIS Take 1 tablet (5 mg total) by mouth 2 (two) times daily.   azelastine 0.05 % ophthalmic solution Commonly known as:  OPTIVAR Place 1 drop into both eyes daily as needed (allergies).   celecoxib 100 MG capsule Commonly known as:  CELEBREX Take 100 mg by mouth 2 (two) times daily as needed for mild pain.   diazepam 5 MG tablet Commonly known as:  VALIUM Take 0.5 tablets (2.5 mg total) by mouth every 12 (twelve) hours as needed for anxiety. What changed:  when to take this   DULoxetine 30 MG capsule Commonly known as:  CYMBALTA Take 90 mg by mouth daily.   DYMISTA 137-50 MCG/ACT Susp Generic drug:  Azelastine-Fluticasone Takes 1 spray in each nostril twice a day as needed for allergies.   enoxaparin 40 MG/0.4ML injection Commonly known as:  LOVENOX Inject 0.4 mLs (40 mg total) into the skin daily. Start taking on:  02/24/2018   MAGNESIUM CITRATE PO Take 1 tablet by mouth 2 (two) times daily with breakfast and lunch.   multivitamin capsule Take 1 capsule by mouth daily.   Omeprazole-Sodium Bicarbonate 20-1100 MG Caps capsule Commonly known as:  ZEGERID Take 1 capsule by mouth daily as needed (acid reflux).   oxyCODONE 5 MG immediate release tablet Commonly known as:  Oxy IR/ROXICODONE Take 1 tablet (5 mg total) by  mouth every 4 (four) hours as needed for severe pain or breakthrough pain.   polyethylene glycol packet Commonly known as:  MIRALAX / GLYCOLAX Take 17 g by mouth daily as needed for mild constipation.   pregabalin 50 MG capsule Commonly known as:  LYRICA Take 150 mg by mouth daily at 12 noon.   senna-docusate 8.6-50 MG tablet Commonly known as:  Senokot-S Take 2 tablets by mouth at bedtime.   TOVIAZ 8 MG Tb24 tablet Generic drug:  fesoterodine Take 8  mg by mouth at bedtime.   traMADol 50 MG tablet Commonly known as:  ULTRAM Take 100 mg by mouth 3 (three) times daily as needed for moderate pain.   traZODone 100 MG tablet Commonly known as:  DESYREL Take 125 mg by mouth at bedtime. Takes 1.25 tablets.   TURMERIC CURCUMIN PO Take 2 capsules by mouth daily.   vitamin C 1000 MG tablet Take 1,000 mg by mouth daily.   VITAMIN D PO Take 1 tablet by mouth daily.   VYVANSE 70 MG capsule Generic drug:  lisdexamfetamine Take 70 mg by mouth daily.      Follow-up Information    Everitt Amber, MD In 3 weeks.   Specialty:  Obstetrics and Gynecology Contact information: [address] [place] [address] [phone]           Signed: Thereasa Solo 02/23/2018, 8:36 AM

## 2018-02-25 ENCOUNTER — Telehealth: Payer: Self-pay

## 2018-02-25 ENCOUNTER — Other Ambulatory Visit: Payer: Self-pay | Admitting: Gynecologic Oncology

## 2018-02-25 DIAGNOSIS — N83209 Unspecified ovarian cyst, unspecified side: Secondary | ICD-10-CM

## 2018-02-25 MED ORDER — OXYCODONE HCL 5 MG PO TABS: 5.0000 mg | ORAL_TABLET | Freq: Four times a day (QID) | ORAL | 0 refills | Status: DC | PRN

## 2018-02-25 NOTE — Telephone Encounter (Signed)
Ms Urias states that her left lower abdominal incision is very sore and draining some yellow/dark brown fluid. There is a really reddened area below the incision.  It is not hot to the touch.  It cold be a bruise. The right lower abdominal incision is also draining some yellow/dark brown fluid. It is enough drainage to soil her panties.  This has been occurring since Tuesday's surgery. She is afebrile. Her pain level has been a 7-8/10.  She used 2 tabs of Oxy IR 5 mg and her pain level was 2-3/10  for ~4 hrs. She is using 2 tylenol extra strength in between doses of pain medication. She has moved her bowels  a small amount a coup;e of times since surgery.  Encouraged her to  Take the Miralax bid and use a 1/2 bottle of mag-citrate to get bowels moving.  The constipation can be adding to her abdominal discomfort. Reviewed above with [name], NP. Pt can take the Oxy IR 2 tabs every 4-6 hrs as needed.  Melissa will send in # 20 tablets  of Oxy Ir 5 mg for patient as she has only 12 left from surgery. She can apply a Telfa pad to her incisions with some pressure to help manage the drainage.  Pt given after hours phone number of [phone] if she develops a temp of 101 or greater and or the the abdominal incisions increase in redness, pain or drainage. Instructed her to call Monday morning with an update as to how she is feeling.

## 2018-02-25 NOTE — Progress Notes (Signed)
See RN Note.

## 2018-02-28 NOTE — Telephone Encounter (Signed)
Joyce Payne states that the redness and bruising on the left lower incisions are all gone. Drainage has stopped from the two incisions. Pain level has decreased with using the 2 OxyIR 5 mg tid. Pt using tylenol in between OxyIR.   Pt developed a cough yesterday.  Producing clear phlegm.  Temp today 99.3.   She is taking Mucinex Sinus.  Her partner has had a viral infection and coughing as well. Reviewed with [name], NP.  She can try Delsym for the cough.  Warm salt water gargles for her throat.  If she is not better or worse,  call the office.

## 2018-03-01 ENCOUNTER — Telehealth: Payer: Self-pay

## 2018-03-01 NOTE — Telephone Encounter (Signed)
Returned pt's call regarding a post op appt with Dr Denman George - I think she is confirming appt or thought she needed to make one.  I see there is an appt already made for her June 6th at 3:30 pm.  Attempted mobile number, no answer, left VM with the appt details and our contact info.

## 2018-03-04 ENCOUNTER — Telehealth: Payer: Self-pay | Admitting: *Deleted

## 2018-03-04 NOTE — Telephone Encounter (Signed)
Pt spoke with PCP regarding cough. Felt it was viral. Suggested that pt call PCP for cough medication.  Pt out of Oxy IR 5 mg tabs.  She is using Tylenol.   Pain in LUQ with cough feels like a knife stabbing her. Pain level with cough can be a 10/10. Suggested pt splint abdomen with coughing.  Apply heat to back and side to help relax herself. Pt requested a refill of OxyIR to help her get through the cough. Reviewed with Dr. Gerarda Fraction.  She will not give refill and agreed with interventions noted above. If pain is severe, patient needs to go to the ED at [facility] to be evaluated.

## 2018-03-04 NOTE — Telephone Encounter (Signed)
Patient called and a left a message that "I am having a cough and congestion. Along with upper left sided abdomen pain."  Message given to the nurse.

## 2018-03-09 ENCOUNTER — Encounter

## 2018-03-09 ENCOUNTER — Other Ambulatory Visit: Payer: Self-pay

## 2018-03-09 ENCOUNTER — Ambulatory Visit: Payer: BC Managed Care – PPO | Admitting: Neurology

## 2018-03-09 ENCOUNTER — Encounter: Payer: Self-pay | Admitting: Neurology

## 2018-03-09 VITALS — BP 126/88 | HR 95 | Ht 66.0 in | Wt 220.0 lb

## 2018-03-09 DIAGNOSIS — R531 Weakness: Secondary | ICD-10-CM | POA: Diagnosis not present

## 2018-03-09 DIAGNOSIS — M542 Cervicalgia: Secondary | ICD-10-CM

## 2018-03-09 DIAGNOSIS — M5412 Radiculopathy, cervical region: Secondary | ICD-10-CM

## 2018-03-09 DIAGNOSIS — R413 Other amnesia: Secondary | ICD-10-CM

## 2018-03-09 MED ORDER — TIZANIDINE HCL 2 MG PO TABS: 2.0000 mg | ORAL_TABLET | Freq: Three times a day (TID) | ORAL | 5 refills | Status: AC | PRN

## 2018-03-09 NOTE — Progress Notes (Signed)
NEUROLOGY CONSULTATION NOTE  Joyce Payne MRN: [id number] DOB: 05-04-1961  Referring provider: Dr. Lennette Bihari Via Primary care provider: Dr. Lona Kettle  Reason for consult:  Dizziness, blurred vision, hearing loss, muscle and joint pain  Dear Dr Via:  Thank you for your kind referral of Joyce Payne for consultation of the above symptoms. Although her history is well known to you, please allow me to reiterate it for the purpose of our medical record. Records and images were personally reviewed where available.  HISTORY OF PRESENT ILLNESS: This is a 57 year old right-handed woman with a history of Hodgkin's lymphoma in remission since 2012, PE during chemotherapy, sleep apnea, ovarian mass s/p recent hysterectomy, neuropathy, presenting for evaluation of several symptoms including dizziness, blurred vision, hearing loss, muscle and joint pain. She became tearful in the office stating that she has overall not been feeling well for the past 3 years. She reports having neck pain 2 years ago (had a pulmonary embolism 4 months prior), followed by pressure on her frontal regions that she attributed to sinuses. She started noticing shooting pain on her left cheek, jaw pain, neck and shoulder pain, and bilateral ear pain. She was diagnosed with TMJ and saw a provider in [place] who helped some. She was having headaches but these are now a lot less frequent, around once a month. Mostly she has shoulder pain radiating down both arms. She feels weak overall, but more in her arms. She saw a chiropractor over the holidays and had adjustments that provided temporary relief. She also saw physical therapy that did primarily pressure points which helped temporarily as well. She was started on Tramadol in November by her PCP. 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 She takes 100-'150mg'$  daily, depending on her back pain. It does not help with her neck  pain. She was started on Cymbalta for depression, increased to '90mg'$  daily 6 months ago. She started noticing cognitive changes 1-1.5 years ago, initially noticing hearing issues and ear pain (TMJ helped with ear pain). Her left eye felt blurry. Her wife would ask her if she heard her, but she notes she is more forgetful than usual, forgetting what she went to get from a room. She denies any missed medications or getting lost driving. She missed a bill payment one time. She broke her foot 3 years ago when she fell from steps. She reports multiple medical issues since then and just overall feeling fatigued and weak.   I personally reviewed MRI brain with and without contrast done 02/16/18 which was normal. MRI cervical spine showed minimal disc bulging at C-5 without stenosis, there was a shallow left posterolateral disc herniation at C6-7 with proximal foraminal encroachment that could affect the left C7 nerve.  Laboratory Data: CBC, CMP normal. ESR 13, vitamin B12 835  PAST MEDICAL HISTORY: Past Medical History:  Diagnosis Date  . Chronic back pain   . Depression with anxiety   . GERD (gastroesophageal reflux disease)   . History of DVT (deep vein thrombosis)    NOV 2011  RLE , completed coumadin therapy:  Also, developed acutre RLE DVT  . History of pulmonary embolism    NOV 2011 during chemothearapy bilateral PEs, COUMDADIN THERAPY ENDED JUNE 2012: Also develeped bilateral acute saddle PEs with right heart strain s/p  thromblysis  . 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 Neuromuscular disorder ([place])   . OA (osteoarthritis)    KNEES , BACK  . OSA (obstructive sleep apnea)    02-21-2018 PER PT NO CPAP  . PONV (postoperative nausea and vomiting)   . Recurrent binge eating 02/16/2012   Reason for topomax  . S/P gastric bypass 03/1998  . Spinal  headache   . SUI (stress urinary incontinence, female)    CONTROLLED W/ TOVIAZ    PAST SURGICAL HISTORY: Past Surgical History:  Procedure Laterality Date  . CHOLECYSTECTOMY N/A 01/02/2018   Procedure: LAPAROSCOPIC CHOLECYSTECTOMY WITH INTRAOPERATIVE CHOLANGIOGRAM;  Surgeon: Excell Seltzer, MD;  Location: [facility];  Service: General;  Laterality: N/A;  . EXCISIONAL BIOPSY RIGHT NECK LYMPH NODE  2011  . GASTRIC BYPASS  1999  . IR GENERIC HISTORICAL  06/25/2016   IR US GUIDE VASC ACCESS RIGHT 06/25/2016 Sandi Mariscal, MD [facility]  . IR GENERIC HISTORICAL  06/25/2016   IR ANGIOGRAM SELECTIVE EACH ADDITIONAL VESSEL 06/25/2016 Sandi Mariscal, MD [facility]  . IR GENERIC HISTORICAL  06/25/2016   IR ANGIOGRAM SELECTIVE EACH ADDITIONAL VESSEL 06/25/2016 Sandi Mariscal, MD [facility]  . IR GENERIC HISTORICAL  06/25/2016   IR ANGIOGRAM PULMONARY BILATERAL SELECTIVE 06/25/2016 Sandi Mariscal, MD [facility]  . IR GENERIC HISTORICAL  06/25/2016   IR INFUSION THROMBOL ARTERIAL INITIAL (MS) 06/25/2016 Sandi Mariscal, MD [facility]  . IR GENERIC HISTORICAL  06/25/2016   IR INFUSION THROMBOL ARTERIAL INITIAL (MS) 06/25/2016 Sandi Mariscal, MD [facility]  . IR GENERIC HISTORICAL  06/26/2016   IR THROMB F/U EVAL ART/VEN FINAL DAY (MS) 06/26/2016 Aletta Edouard, MD [facility]  . [place] SURGERY  2009  . ORIF TOE FRACTURE Left 06/27/2015   Procedure: OPEN REDUCTION INTERNAL FIXATION (ORIF) LEFT FOURTH AND FIFTH METATARSAL (TOE) FRACTURE NONUNION;  Surgeon: Wylene Simmer, MD;  Location: [facility];  Service: Orthopedics;  Laterality: Left;  . PATELLA FRACTURE SURGERY Left 1990s  . PORT-A-CATH INSERTION  2011   PAC REMOVED IN 2012  . ROBOTIC ASSISTED SALPINGO OOPHERECTOMY N/A 02/22/2018   Procedure: XI ROBOTIC ASSISTED HYSTERECTOMY, BILATERAL  SALPINGO OOPHORECTOMY, OMENTECTOMY; LYMPHADNECTOMY; PERITONEAL BIOPSIES;  Surgeon: Everitt Amber, MD;  Location: [facility];  Service: Gynecology;  Laterality: N/A;  .  SHOULDER CLOSED REDUCTION  01/18/2012   Procedure: CLOSED MANIPULATION SHOULDER;  Surgeon: Johnn Hai, MD;  Location: [place];  Service: Orthopedics;  Laterality: Left;  . TONSILECTOMY, ADENOIDECTOMY, BILATERAL MYRINGOTOMY AND TUBES  1985  . TRANSTHORACIC ECHOCARDIOGRAM      MEDICATIONS: Current Outpatient Medications on File Prior to Visit  Medication Sig Dispense Refill  . acetaminophen (TYLENOL) 500 MG tablet Take 1,000 mg by mouth 2 (two) times daily as needed for mild pain or headache.     Marland Kitchen acetaminophen (TYLENOL) 500 MG tablet Take 2 tablets (1,000 mg total) by mouth every 6 (six) hours. 30 tablet 0  . apixaban (ELIQUIS) 5 MG TABS tablet Take 1 tablet (5 mg total) by mouth 2 (two) times daily. 60 tablet 1  . Ascorbic Acid (VITAMIN C) 1000 MG tablet Take 1,000 mg by mouth daily.    Marland Kitchen azelastine (OPTIVAR) 0.05 % ophthalmic solution Place 1 drop into both eyes daily as needed (allergies).     . celecoxib (CELEBREX) 100 MG capsule Take 100 mg by mouth 2 (two) times daily as needed for mild pain.   9  .  Cholecalciferol (VITAMIN D PO) Take 1 tablet by mouth daily.    . diazepam (VALIUM) 5 MG tablet Take 0.5 tablets (2.5 mg total) by mouth every 12 (twelve) hours as needed for anxiety. (Patient taking differently: Take 2.5 mg by mouth every 8 (eight) hours as needed for anxiety. ) 30 tablet 0  . DULoxetine (CYMBALTA) 30 MG capsule Take 90 mg by mouth daily.     Marland Kitchen DYMISTA 137-50 MCG/ACT SUSP Takes 1 spray in each nostril twice a day as needed for allergies.  3  . enoxaparin (LOVENOX) 40 MG/0.4ML injection Inject 0.4 mLs (40 mg total) into the skin daily. 6 Syringe 0  . fesoterodine (TOVIAZ) 8 MG TB24 tablet Take 8 mg by mouth at bedtime.     Marland Kitchen MAGNESIUM CITRATE PO Take 1 tablet by mouth 2 (two) times daily with breakfast and lunch.    . Multiple Vitamin (MULTIVITAMIN) capsule Take 1 capsule by mouth daily.    [name] Bicarbonate (ZEGERID) 20-1100 MG CAPS  capsule Take 1 capsule by mouth daily as needed (acid reflux).     Marland Kitchen oxyCODONE (OXY IR/ROXICODONE) 5 MG immediate release tablet Take 1-2 tablets (5-10 mg total) by mouth every 6 (six) hours as needed for severe pain or breakthrough pain. 20 tablet 0  . polyethylene glycol (MIRALAX / GLYCOLAX) packet Take 17 g by mouth daily as needed for mild constipation. 14 each 0  . pregabalin (LYRICA) 50 MG capsule Take 150 mg by mouth daily at 12 noon.     . senna-docusate (SENOKOT-S) 8.6-50 MG tablet Take 2 tablets by mouth at bedtime. 30 tablet 1  . traMADol (ULTRAM) 50 MG tablet Take 100 mg by mouth 3 (three) times daily as needed for moderate pain.   1  . traZODone (DESYREL) 100 MG tablet Take 125 mg by mouth at bedtime. Takes 1.25 tablets.  1  . TURMERIC CURCUMIN PO Take 2 capsules by mouth daily.     Marland Kitchen VYVANSE 70 MG capsule Take 70 mg by mouth daily.  0   No current facility-administered medications on file prior to visit.     ALLERGIES: Allergies  Allergen Reactions  . Tape Other (See Comments)    TEARS THE SKIN (SKIN IS THIN!!)  . Latex Rash  . Povidone-Iodine Rash    FAMILY HISTORY: Family History  Problem Relation Age of Onset  . Hypertension Mother   . Hyperlipidemia Mother   . Cancer Father 87       colon can  . Hypertension Other   . Cancer Other        colon, 1st degree relative<60    SOCIAL HISTORY: Social History   Socioeconomic History  . Marital status: Married    Spouse name: Not on file  . Number of children: 0  . Years of education: Not on file  . Highest education level: Not on file  Occupational History  . Occupation: Agricultural engineer: [employer]  . Financial resource strain: Not on file  . Food insecurity:    Worry: Not on file    Inability: Not on file  . Transportation needs:    Medical: Not on file    Non-medical: Not on file  Tobacco Use  . Smoking status: Former Smoker    Packs/day: 1.50    Years: 17.00     Pack years: 25.50    Types: Cigarettes    Last attempt to quit: 10/22/2006    Years since quitting:  11.3  . Smokeless tobacco: Never Used  Substance and Sexual Activity  . Alcohol use: No    Comment: 02-21-2018 stop alcohol 24 yrs ago (1995 approx.)  . Drug use: Not Currently  . Sexual activity: Yes    Birth control/protection: Post-menopausal  Lifestyle  . Physical activity:    Days per week: Not on file    Minutes per session: Not on file  . Stress: Not on file  Relationships  . Social connections:    Talks on phone: Not on file    Gets together: Not on file    Attends religious service: Not on file    Active member of club or organization: Not on file    Attends meetings of clubs or organizations: Not on file    Relationship status: Not on file  . Intimate partner violence:    Fear of current or ex partner: Not on file    Emotionally abused: Not on file    Physically abused: Not on file    Forced sexual activity: Not on file  Other Topics Concern  . Not on file  Social History Narrative   Regular exercise- NO    REVIEW OF SYSTEMS: Constitutional: No fevers, chills, or sweats, + generalized fatigue, change in appetite Eyes: No visual changes, double vision, eye pain Ear, nose and throat: No hearing loss, ear pain, nasal congestion, sore throat Cardiovascular: No chest pain, palpitations Respiratory:  No shortness of breath at rest or with exertion, wheezes GastrointestinaI: No nausea, vomiting, diarrhea, abdominal pain, fecal incontinence Genitourinary:  No dysuria, urinary retention or frequency Musculoskeletal:  + neck pain, back pain Integumentary: No rash, pruritus, skin lesions Neurological: as above Psychiatric: No depression, insomnia, anxiety Endocrine: No palpitations, +fatigue,no diaphoresis, mood swings, change in appetite, change in weight, increased thirst Hematologic/Lymphatic:  No anemia, purpura, petechiae. Allergic/Immunologic: no itchy/runny eyes,  nasal congestion, recent allergic reactions, rashes  PHYSICAL EXAM: Vitals:   03/09/18 1424  BP: 126/88  Pulse: 95  SpO2: 97%   General: No acute distress, anxious Head:  Normocephalic/atraumatic Eyes: Fundoscopic exam shows bilateral sharp discs, no vessel changes, exudates, or hemorrhages Neck: supple, no paraspinal tenderness, full range of motion Back: No paraspinal tenderness Heart: regular rate and rhythm Lungs: Clear to auscultation bilaterally. Vascular: No carotid bruits. Skin/Extremities: No rash, no edema Neurological Exam: Mental status: alert and oriented to person, place, and time, no dysarthria or aphasia, Fund of knowledge is appropriate.  Recent and remote memory are intact.  Attention and concentration are normal.    Able to name objects and repeat phrases. Able to name 12 words starting with F in 1 minute (nl >11). CDT 5/5 MMSE - Mini Mental State Exam 03/09/2018  Orientation to time 5  Orientation to Place 5  Registration 3  Attention/ Calculation 5  Recall 1  Language- name 2 objects 2  Language- repeat 1  Language- follow 3 step command 3  Language- read & follow direction 1  Write a sentence 1  Copy design 1  Total score 28   Cranial nerves: CN I: not tested CN II: pupils equal, round and reactive to light, visual fields intact, fundi unremarkable. CN III, IV, VI:  full range of motion, no nystagmus, no ptosis CN V: reports decrease pin on left V2, decreased cold on right V2 CN VII: upper and lower face symmetric CN VIII: hearing intact to finger rub CN IX, X: gag intact, uvula midline CN XI: sternocleidomastoid and trapezius muscles intact CN XII: tongue  midline Bulk & Tone: normal, no fasciculations. Motor: 5/5 throughout with no pronator drift. Sensation: intact to light touch, cold, pin on both UE and LE, decreased vibration to ankles bilaterally. No extinction to double simultaneous stimulation.  Romberg test negative Deep Tendon Reflexes: +2  throughout except for absent right ankle jerk, no ankle clonus Plantar responses: downgoing bilaterally Cerebellar: no incoordination on finger to nose, heel to shin. No dysdiadochokinesia Gait: narrow-based and steady, able to tandem walk adequately. Tremor: none  IMPRESSION: This is a 57 year old right-handed woman with a history of Hodgkin's lymphoma in remission since 2012, PE during chemotherapy, sleep apnea, ovarian mass s/p recent hysterectomy, neuropathy, presenting with several symptoms but most concerned about the neck/shoulder pain and cognitive changes, fatigue. We discussed normal MRI brain, she was reassured about her concern for MS, there is no evidence of MS on exam or imaging. We discussed neck pain due to cervical radiculopathy, she is reporting weakness in both arms, and will be scheduled for an EMG/NCV of both UE to further evaluate symptoms. Discussed doing PT with Neurorehab, she would like to wait for ovarian pathology and next steps with her oncologist first before proceeding with PT. She will try Tizanidine for neck pain, side effects were discussed, dose can be increased if needed. We discussed cognitive issues that are likely due to underlying depression and anxiety, which is also likely magnifying pain symptoms. Bloodwork from PCP will be requested for review. Continue follow-up with Behavioral medicine, she was encouraged to find a new psychiatrist to establish care. She will follow-up in 6 months and knows to call for any changes.   Thank you for allowing me to participate in the care of this patient. Please do not hesitate to call for any questions or concerns.   Ellouise Newer, M.D.  CC: Dr. Harrington Challenger, Dr. Lynelle Doctor

## 2018-03-09 NOTE — Patient Instructions (Addendum)
1. Schedule EMG/NCV of both UE 2. Try Tizanidine 2mg  every 8 hours as needed for neck pain (start with taking it at bedtime first) 3. Once you are ready to start PT with Neurorehab, let us know 4. We will let you know if more bloodwork is needed 5. Look into working with psychiatrist Dr. Toy Care, continue therapy 6. Follow-up in 6 months, call for any changes

## 2018-03-15 ENCOUNTER — Ambulatory Visit: Payer: BC Managed Care – PPO | Admitting: Pulmonary Disease

## 2018-03-17 ENCOUNTER — Encounter: Payer: Self-pay | Admitting: Gynecologic Oncology

## 2018-03-17 ENCOUNTER — Ambulatory Visit: Payer: BC Managed Care – PPO | Admitting: Gynecologic Oncology

## 2018-03-17 ENCOUNTER — Inpatient Hospital Stay: Payer: BC Managed Care – PPO | Attending: Hematology and Oncology | Admitting: Gynecologic Oncology

## 2018-03-17 VITALS — BP 136/84 | HR 92 | Temp 98.2°F | Resp 20 | Ht 66.0 in | Wt 218.4 lb

## 2018-03-17 DIAGNOSIS — Z90722 Acquired absence of ovaries, bilateral: Secondary | ICD-10-CM | POA: Diagnosis not present

## 2018-03-17 DIAGNOSIS — D3911 Neoplasm of uncertain behavior of right ovary: Secondary | ICD-10-CM

## 2018-03-17 DIAGNOSIS — D3910 Neoplasm of uncertain behavior of unspecified ovary: Secondary | ICD-10-CM | POA: Insufficient documentation

## 2018-03-17 DIAGNOSIS — R971 Elevated cancer antigen 125 [CA 125]: Secondary | ICD-10-CM | POA: Diagnosis not present

## 2018-03-17 DIAGNOSIS — Z90710 Acquired absence of both cervix and uterus: Secondary | ICD-10-CM | POA: Diagnosis not present

## 2018-03-17 NOTE — Patient Instructions (Signed)
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 It is not cancer, but has a very small chance of returning, typically many years after surgery, with nodules in the abdominal cavity.   Follow-up should be annual checks with your OBGYN including a pelvic examination to feel for masses.   Please contact Dr Denman George Deshler with questions or concerns.

## 2018-03-17 NOTE — Progress Notes (Signed)
Follow-up Note: Gyn-Onc  Consult was requested by Dr. Alvy Bimler for the evaluation of Joyce Payne 57 y.o. female  CC:  Chief Complaint  Patient presents with  . Ovarian tumor of borderline malignancy, unspecified laterali    Assessment/Plan:  Joyce Payne  is a 57 y.o.  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   I explained to Joyce Payne and her partner the nature of LMP tumors and the favorable prognosis with low risk of recurrence that adjuvant therapy is not required. NCCN guidelines recommend annual surveillance with consideration of tumor markers (CA 125). Imaging is not necessary, though pelvic examination is recommended.   Pathology report provided. All questions answered.   HPI: Joyce Payne is a 57 year old woman who was seen in consultation at the request of Dr. Alvy Bimler for pelvic mass.  The patient's history began in March 2019 when she was seen in the ER of [facility] with acute abdominal pain, high-grade fever, and sepsis.  CT scan of the abdomen and pelvis was performed on December 31, 2017 which revealed a distended gallbladder with mucosal enhancement and mild surrounding inflammatory changes compatible with acute cholecystitis.  There was adjacent inflammatory reactive changes along the duodenum pancreatic head and colon.  A midline pelvic complex partially cystic mass measuring 7 x 9 cm was appreciated.  No ascites carcinomatosis or other evidence of metastatic disease was appreciated.  She was admitted to the hospital for 48 hours of antibiotics and to take her off her Eliquis.  She then went to the operating room with Dr. Excell Seltzer who performed laparoscopic lysis of adhesions and cholecystectomy.  A drain was left.  Lysis of adhesions was necessary in the upper abdomen to separate some prior adhesions from her gastric bypass surgery.  She healed well from her surgery with no  complications.  Patient has a remote history of lymphoma in 2011 treated with chemotherapy with a complete response.  During that treatment she developed DVT and PE was treated for 12 months with anticoagulant therapy.  In 2017 she developed an acute saddle pulmonary embolism after a prolonged period of immobility on a car ride.  She was treated with Eliquis following this event and is currently on therapeutic Eliquis.  She is a remote history of gastric bypass via laparotomy and more recent history of cholecystectomy as noted above but no other prior abdominal surgeries.  She has had no pregnancies.  She is postmenopausal since being on chemotherapy for her lymphoma.  She is no history of abnormal Pap smears.  She has a strong family history for malignancy including father with colon cancer at a young age who died in his 31s.  She has not had genetic testing.  Preop CA 125 was elevated at 57.  Interval Hx: On 02/22/18 the patient underwent a robotic surgical procedure (robotic assisted total hysterectomy, BSO, pelvic lymphadenectomy, peritoneal biopsies, omentectomy).  Frozen section revealed borderline tumor. Final pathology confirmed a serous low malignant "borderline" tumor of the right ovary and a benign serous cyst on the left ovary. All other biopsies were benign and free of disease. She was staged as stage IA serous borderline tumor. Given the non-malignant nature of this lesions, no adjuvant therapy was recommended in accordance with NCCN guidelines.   Current Meds:  Outpatient Encounter Medications as of 03/17/2018  Medication Sig  . acetaminophen (TYLENOL) 500 MG tablet Take 2 tablets (1,000 mg total)  by mouth every 6 (six) hours.  Marland Kitchen apixaban (ELIQUIS) 5 MG TABS tablet Take 1 tablet (5 mg total) by mouth 2 (two) times daily.  . Ascorbic Acid (VITAMIN C) 1000 MG tablet Take 1,000 mg by mouth daily.  Marland Kitchen azelastine (OPTIVAR) 0.05 % ophthalmic solution Place 1 drop into both eyes daily as needed  (allergies).   . cefdinir (OMNICEF) 300 MG capsule TK 1 C PO BID  . celecoxib (CELEBREX) 100 MG capsule Take 100 mg by mouth 2 (two) times daily as needed for mild pain.   . Cholecalciferol (VITAMIN D PO) Take 1 tablet by mouth daily.  . diazepam (VALIUM) 5 MG tablet Take 0.5 tablets (2.5 mg total) by mouth every 12 (twelve) hours as needed for anxiety. (Patient taking differently: Take 2.5 mg by mouth every 8 (eight) hours as needed for anxiety. )  . DULoxetine (CYMBALTA) 30 MG capsule Take 90 mg by mouth daily.   Marland Kitchen DYMISTA 137-50 MCG/ACT SUSP Takes 1 spray in each nostril twice a day as needed for allergies.  . fesoterodine (TOVIAZ) 8 MG TB24 tablet Take 8 mg by mouth at bedtime.   Marland Kitchen MAGNESIUM CITRATE PO Take 1 tablet by mouth 2 (two) times daily with breakfast and lunch.  . Multiple Vitamin (MULTIVITAMIN) capsule Take 1 capsule by mouth daily.  [name] Bicarbonate (ZEGERID) 20-1100 MG CAPS capsule Take 1 capsule by mouth daily as needed (acid reflux).   . polyethylene glycol (MIRALAX / GLYCOLAX) packet Take 17 g by mouth daily as needed for mild constipation.  . pregabalin (LYRICA) 50 MG capsule Take 150 mg by mouth daily at 12 noon.   . senna-docusate (SENOKOT-S) 8.6-50 MG tablet Take 2 tablets by mouth at bedtime.  Marland Kitchen tiZANidine (ZANAFLEX) 2 MG tablet Take 1 tablet (2 mg total) by mouth every 8 (eight) hours as needed for muscle spasms.  . traMADol (ULTRAM) 50 MG tablet Take 100 mg by mouth 3 (three) times daily as needed for moderate pain.   . traZODone (DESYREL) 100 MG tablet Take 125 mg by mouth at bedtime. Takes 1.25 tablets.  . TURMERIC CURCUMIN PO Take 2 capsules by mouth daily.   Marland Kitchen VYVANSE 70 MG capsule Take 70 mg by mouth daily.  . [DISCONTINUED] acetaminophen (TYLENOL) 500 MG tablet Take 1,000 mg by mouth 2 (two) times daily as needed for mild pain or headache.    No facility-administered encounter medications on file as of 03/17/2018.     Allergy:  Allergies   Allergen Reactions  . Tape Other (See Comments)    TEARS THE SKIN (SKIN IS THIN!!)  . Latex Rash  . Povidone-Iodine Rash    Social Hx:   Social History   Socioeconomic History  . Marital status: Married    Spouse name: Not on file  . Number of children: 0  . Years of education: Not on file  . Highest education level: Not on file  Occupational History  . Occupation: Agricultural engineer: [employer]  . Financial resource strain: Not on file  . Food insecurity:    Worry: Not on file    Inability: Not on file  . Transportation needs:    Medical: Not on file    Non-medical: Not on file  Tobacco Use  . Smoking status: Former Smoker    Packs/day: 1.50    Years: 17.00    Pack years: 25.50    Types: Cigarettes    Last attempt to quit: 10/22/2006  Years since quitting: 11.4  . Smokeless tobacco: Never Used  Substance and Sexual Activity  . Alcohol use: No    Comment: 02-21-2018 stop alcohol 24 yrs ago (1995 approx.)  . Drug use: Not Currently  . Sexual activity: Yes    Birth control/protection: Post-menopausal  Lifestyle  . Physical activity:    Days per week: Not on file    Minutes per session: Not on file  . Stress: Not on file  Relationships  . Social connections:    Talks on phone: Not on file    Gets together: Not on file    Attends religious service: Not on file    Active member of club or organization: Not on file    Attends meetings of clubs or organizations: Not on file    Relationship status: Not on file  . Intimate partner violence:    Fear of current or ex partner: Not on file    Emotionally abused: Not on file    Physically abused: Not on file    Forced sexual activity: Not on file  Other Topics Concern  . Not on file  Social History Narrative   Regular exercise- NO    Past Surgical Hx:  Past Surgical History:  Procedure Laterality Date  . CHOLECYSTECTOMY N/A 01/02/2018   Procedure: LAPAROSCOPIC CHOLECYSTECTOMY  WITH INTRAOPERATIVE CHOLANGIOGRAM;  Surgeon: Excell Seltzer, MD;  Location: [facility];  Service: General;  Laterality: N/A;  . EXCISIONAL BIOPSY RIGHT NECK LYMPH NODE  2011  . GASTRIC BYPASS  1999  . IR GENERIC HISTORICAL  06/25/2016   IR US GUIDE VASC ACCESS RIGHT 06/25/2016 Sandi Mariscal, MD [facility]  . IR GENERIC HISTORICAL  06/25/2016   IR ANGIOGRAM SELECTIVE EACH ADDITIONAL VESSEL 06/25/2016 Sandi Mariscal, MD [facility]  . IR GENERIC HISTORICAL  06/25/2016   IR ANGIOGRAM SELECTIVE EACH ADDITIONAL VESSEL 06/25/2016 Sandi Mariscal, MD [facility]  . IR GENERIC HISTORICAL  06/25/2016   IR ANGIOGRAM PULMONARY BILATERAL SELECTIVE 06/25/2016 Sandi Mariscal, MD [facility]  . IR GENERIC HISTORICAL  06/25/2016   IR INFUSION THROMBOL ARTERIAL INITIAL (MS) 06/25/2016 Sandi Mariscal, MD [facility]  . IR GENERIC HISTORICAL  06/25/2016   IR INFUSION THROMBOL ARTERIAL INITIAL (MS) 06/25/2016 Sandi Mariscal, MD [facility]  . IR GENERIC HISTORICAL  06/26/2016   IR THROMB F/U EVAL ART/VEN FINAL DAY (MS) 06/26/2016 Aletta Edouard, MD [facility]  . [place] SURGERY  2009  . ORIF TOE FRACTURE Left 06/27/2015   Procedure: OPEN REDUCTION INTERNAL FIXATION (ORIF) LEFT FOURTH AND FIFTH METATARSAL (TOE) FRACTURE NONUNION;  Surgeon: Wylene Simmer, MD;  Location: [place];  Service: Orthopedics;  Laterality: Left;  . PATELLA FRACTURE SURGERY Left 1990s  . PORT-A-CATH INSERTION  2011   PAC REMOVED IN 2012  . ROBOTIC ASSISTED SALPINGO OOPHERECTOMY N/A 02/22/2018   Procedure: XI ROBOTIC ASSISTED HYSTERECTOMY, BILATERAL  SALPINGO OOPHORECTOMY, OMENTECTOMY; LYMPHADNECTOMY; PERITONEAL BIOPSIES;  Surgeon: Everitt Amber, MD;  Location: [facility];  Service: Gynecology;  Laterality: N/A;  . SHOULDER CLOSED REDUCTION  01/18/2012   Procedure: CLOSED MANIPULATION SHOULDER;  Surgeon: Johnn Hai, MD;  Location: [facility];  Service: Orthopedics;  Laterality: Left;  . TONSILECTOMY, ADENOIDECTOMY, BILATERAL  MYRINGOTOMY AND TUBES  1985  . TRANSTHORACIC ECHOCARDIOGRAM      Past Medical Hx:  Past Medical History:  Diagnosis Date  . Chronic back pain   . Depression with anxiety   . GERD (gastroesophageal reflux disease)   . History of DVT (deep  vein thrombosis)    NOV 2011  RLE , completed coumadin therapy:  Also, developed acutre RLE DVT  . History of pulmonary embolism    NOV 2011 during chemothearapy bilateral PEs, COUMDADIN THERAPY ENDED JUNE 2012: Also develeped bilateral acute saddle PEs with right heart strain s/p  thromblysis  . 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 Neuromuscular disorder ([place])   . OA (osteoarthritis)    KNEES , BACK  . OSA (obstructive sleep apnea)    02-21-2018 PER PT NO CPAP  . PONV (postoperative nausea and vomiting)   . Recurrent binge eating 02/16/2012   Reason for topomax  . S/P gastric bypass 03/1998  . Spinal headache   . SUI (stress urinary incontinence, female)    [place]    Past Gynecological History:  G0 Patient's last menstrual period was 10/25/2012.  Family Hx:  Family History  Problem Relation Age of Onset  . Hypertension Mother   . Hyperlipidemia Mother   . Cancer Father 73       colon can  . Hypertension Other   . Cancer Other        colon, 1st degree relative<60    Review of Systems:  Constitutional  Feels well,    ENT Normal appearing ears and nares bilaterally Skin/Breast  No rash, sores, jaundice, itching, dryness Cardiovascular  No chest pain, shortness of breath, or edema  Pulmonary  No cough or wheeze.  Gastro Intestinal  No nausea, vomitting, or diarrhoea. No bright red blood per rectum, no abdominal pain, change in bowel movement, or constipation.  Genito Urinary  No frequency, urgency, dysuria, no pain Musculo Skeletal  No myalgia, arthralgia, joint  swelling or pain  Neurologic  No weakness, numbness, change in gait,  Psychology  No depression, anxiety, insomnia.   Vitals:  Blood pressure 136/84, pulse 92, temperature 98.2 F (36.8 C), temperature source Oral, resp. rate 20, height 5\' 6"  (1.676 m), weight 218 lb 6.4 oz (99.1 kg), last menstrual period 10/25/2012, SpO2 98 %.  Physical Exam: WD in NAD Neck  Supple NROM, without any enlargements.  Lymph Node Survey No cervical supraclavicular or inguinal adenopathy Cardiovascular  Pulse normal rate, regularity and rhythm. S1 and S2 normal.  Lungs  Clear to auscultation bilateraly, without wheezes/crackles/rhonchi. Good air movement.  Skin  No rash/lesions/breakdown  Psychiatry  Alert and oriented to person, place, and time  Abdomen  Normoactive bowel sounds, abdomen soft, non-tender and obese without evidence of hernia. No masses. Well healed robotic incisions x 5.  Back No CVA tenderness Genito Urinary  Vaginal cuff in tact with suture material visible. No blood, lesions or separation. Rectal  deferred Extremities  No bilateral cyanosis, clubbing or edema.   30 minutes of direct face to face counseling time was spent with the patient. This included discussion about prognosis, therapy recommendations and postoperative side effects and are beyond the scope of routine postoperative care.   Thereasa Solo, MD  03/17/2018, 4:51 PM

## 2018-03-18 ENCOUNTER — Telehealth: Payer: Self-pay | Admitting: Hematology and Oncology

## 2018-03-18 NOTE — Telephone Encounter (Signed)
I spoke with the patient over the telephone She is recovering well from surgery She is thankful for the referral to see gynecologist oncologist and recent surgery From the lymphoma standpoint, she does not need long-term follow-up From the pulmonary embolism standpoint, she will continue long-term anticoagulation therapy for secondary prevention I have addressed all her questions and concerns

## 2018-03-22 ENCOUNTER — Encounter: Payer: BC Managed Care – PPO | Admitting: Neurology

## 2018-03-31 ENCOUNTER — Encounter: Payer: BC Managed Care – PPO | Admitting: Neurology

## 2018-04-05 ENCOUNTER — Encounter: Payer: BC Managed Care – PPO | Admitting: Neurology

## 2018-04-20 ENCOUNTER — Encounter: Payer: Self-pay | Admitting: Pulmonary Disease

## 2018-04-20 ENCOUNTER — Ambulatory Visit (INDEPENDENT_AMBULATORY_CARE_PROVIDER_SITE_OTHER): Payer: BC Managed Care – PPO | Admitting: Pulmonary Disease

## 2018-04-20 VITALS — BP 132/74 | HR 104 | Ht 67.0 in | Wt 222.0 lb

## 2018-04-20 DIAGNOSIS — I2692 Saddle embolus of pulmonary artery without acute cor pulmonale: Secondary | ICD-10-CM

## 2018-04-20 NOTE — Progress Notes (Signed)
Subjective:    Patient ID: [name], female    DOB: 1960-11-18, 57 y.o.   MRN: [id number]  Synopsis hospitalized in September 2017 for large pulmonary embolism requiring catheter directed thromboliysis. Had a history of DVT/PE in 2011 while she had Hodgkin's lymphoma. Echocardiogram during her September 2017 hospitalization showed RVSP of 47 mmHg.  HPI Chief Complaint  Patient presents with  . Follow-up   She has not had any bleeding episodes or recurrent problems with new onset dyspnea since last visit.  The interval history has been quite eventful though she was diagnosed with ovarian cancer and had a TAH/BSO.  She also had a cholecystectomy.  Around the time of the TAH/BSO she did have a cold with some cough which she says was painful.  Approximately 1 year ago she saw cardiology for evaluation of persistent tachycardia and a repeat echocardiogram was reassuring.  Past Medical History:  Diagnosis Date  . Chronic back pain   . Depression with anxiety   . GERD (gastroesophageal reflux disease)   . History of DVT (deep vein thrombosis)    NOV 2011  RLE , completed coumadin therapy:  Also, developed acutre RLE DVT  . History of pulmonary embolism    NOV 2011 during chemothearapy bilateral PEs, COUMDADIN THERAPY ENDED JUNE 2012: Also develeped bilateral acute saddle PEs with right heart strain s/p  thromblysis  . 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 Neuromuscular disorder ([place])   . OA (osteoarthritis)    KNEES , BACK  . OSA (obstructive sleep apnea)    02-21-2018 PER PT NO CPAP  . PONV (postoperative nausea and vomiting)   . Recurrent binge eating 02/16/2012   Reason for topomax  . S/P gastric bypass 03/1998  . Spinal headache   . SUI (stress urinary incontinence, female)    CONTROLLED W/ TOVIAZ        Review of Systems    Constitutional: Negative for fever and unexpected weight change.  HENT: Negative for congestion, dental problem, ear pain, nosebleeds, postnasal drip, rhinorrhea, sinus pressure, sneezing, sore throat and trouble swallowing.   Eyes: Negative for redness and itching.  Respiratory: Negative for cough, chest tightness, shortness of breath and wheezing.   Cardiovascular: Negative for palpitations and leg swelling.       Objective:   Physical Exam Vitals:   04/20/18 1212  BP: 132/74  Pulse: (!) 104  SpO2: 97%  Weight: 222 lb (100.7 kg)  Height: 5\' 7"  (1.702 m)    RA  Gen: well appearing HENT: OP clear, TM's clear, neck supple PULM: CTA B, normal percussion CV: RRR, no mgr, trace edema GI: BS+, soft, nontender Derm: no cyanosis or rash Psyche: normal mood and affect     CBC    Component Value Date/Time   WBC 9.8 02/23/2018 0424   RBC 3.90 02/23/2018 0424   HGB 11.9 (L) 02/23/2018 0424   HGB 14.0 01/11/2017 1114   HCT 36.2 02/23/2018 0424   HCT 41.5 01/11/2017 1114   PLT 219 02/23/2018 0424   PLT 241 01/11/2017 1114   MCV 92.8 02/23/2018 0424   MCV 93.2 01/11/2017 1114   MCH 30.5 02/23/2018 0424   MCHC 32.9 02/23/2018 0424   RDW 13.3 02/23/2018 0424   RDW 13.5 01/11/2017 1114   LYMPHSABS 2.3 02/11/2018 1450   LYMPHSABS 1.6 01/11/2017  1114   MONOABS 0.4 02/11/2018 1450   MONOABS 0.5 01/11/2017 1114   EOSABS 0.1 02/11/2018 1450   EOSABS 0.0 01/11/2017 1114   BASOSABS 0.0 02/11/2018 1450   BASOSABS 0.0 01/11/2017 1114    BMET    Component Value Date/Time   NA 142 02/23/2018 0424   NA 145 01/11/2017 1114   K 4.0 02/23/2018 0424   K 4.7 01/11/2017 1114   CL 107 02/23/2018 0424   CL 108 (H) 02/06/2013 1022   CO2 26 02/23/2018 0424   CO2 28 01/11/2017 1114   GLUCOSE 140 (H) 02/23/2018 0424   GLUCOSE 94 01/11/2017 1114   GLUCOSE 108 (H) 02/06/2013 1022   BUN 14 02/23/2018 0424   BUN 14.4 01/11/2017 1114   CREATININE 0.68 02/23/2018 0424   CREATININE 0.8  01/11/2017 1114   CALCIUM 8.8 (L) 02/23/2018 0424   CALCIUM 9.6 01/11/2017 1114   GFRNONAA >60 02/23/2018 0424   GFRAA >60 02/23/2018 0424       Assessment & Plan:   Acute saddle pulmonary embolism without acute cor pulmonale (HCC)  Discussion: Despite all the surgical issues she has had recently this has been a stable interval for Joyce Payne without recurrent pulmonary emboli.  After her last visit with me I discussed her case with hematology and we all agreed to that Joyce Payne 5 mg twice a day was the best approach for her indefinitely.  Idiopathic pulmonary embolism: Continue taking Joyce Payne 5 milligrams twice a day indefinitely  We will see you back in 1 year or sooner if needed   Current Outpatient Medications:  .  acetaminophen (TYLENOL) 500 MG tablet, Take 2 tablets (1,000 mg total) by mouth every 6 (six) hours., Disp: 30 tablet, Rfl: 0 .  apixaban (Joyce Payne) 5 MG TABS tablet, Take 1 tablet (5 mg total) by mouth 2 (two) times daily., Disp: 60 tablet, Rfl: 1 .  Ascorbic Acid (VITAMIN C) 1000 MG tablet, Take 1,000 mg by mouth daily., Disp: , Rfl:  .  azelastine (OPTIVAR) 0.05 % ophthalmic solution, Place 1 drop into both eyes daily as needed (allergies). , Disp: , Rfl:  .  celecoxib (CELEBREX) 100 MG capsule, Take 100 mg by mouth 2 (two) times daily as needed for mild pain. , Disp: , Rfl: 9 .  Cholecalciferol (VITAMIN D PO), Take 1 tablet by mouth daily., Disp: , Rfl:  .  diazepam (VALIUM) 5 MG tablet, Take 0.5 tablets (2.5 mg total) by mouth every 12 (twelve) hours as needed for anxiety. (Patient taking differently: Take 2.5 mg by mouth every 8 (eight) hours as needed for anxiety. ), Disp: 30 tablet, Rfl: 0 .  DULoxetine (CYMBALTA) 30 MG capsule, Take 90 mg by mouth daily. , Disp: , Rfl:  .  DYMISTA 137-50 MCG/ACT SUSP, Takes 1 spray in each nostril twice a day as needed for allergies., Disp: , Rfl: 3 .  fesoterodine (TOVIAZ) 8 MG TB24 tablet, Take 8 mg by mouth at bedtime. , Disp: , Rfl:    .  MAGNESIUM CITRATE PO, Take 1 tablet by mouth 2 (two) times daily with breakfast and lunch., Disp: , Rfl:  .  Multiple Vitamin (MULTIVITAMIN) capsule, Take 1 capsule by mouth daily., Disp: , Rfl:  .  Omeprazole-Sodium Bicarbonate (ZEGERID) 20-1100 MG CAPS capsule, Take 1 capsule by mouth daily as needed (acid reflux). , Disp: , Rfl:  .  pregabalin (LYRICA) 50 MG capsule, Take 150 mg by mouth daily at 12 noon. , Disp: , Rfl:  .  tiZANidine (  ZANAFLEX) 2 MG tablet, Take 1 tablet (2 mg total) by mouth every 8 (eight) hours as needed for muscle spasms., Disp: 90 tablet, Rfl: 5 .  traMADol (ULTRAM) 50 MG tablet, Take 100 mg by mouth 3 (three) times daily as needed for moderate pain. , Disp: , Rfl: 1 .  traZODone (DESYREL) 100 MG tablet, Take 125 mg by mouth at bedtime. Takes 1.25 tablets., Disp: , Rfl: 1 .  TURMERIC CURCUMIN PO, Take 2 capsules by mouth daily. , Disp: , Rfl:

## 2018-04-20 NOTE — Patient Instructions (Signed)
Idiopathic pulmonary embolism: Continue taking Eliquis 5 milligrams twice a day indefinitely  We will see you back in 1 year or sooner if neede

## 2018-05-03 ENCOUNTER — Encounter: Payer: BC Managed Care – PPO | Admitting: Neurology

## 2018-05-19 IMAGING — CR DG CHEST 2V
2 series · 2 of 2 positions shown · non-contrast
Comparison: Chest x-ray of 06/24/2016

CLINICAL DATA: Chest pain, shortness of breath over the last 2
days, dizziness

EXAM:
CHEST  2 VIEW

[chest pa]
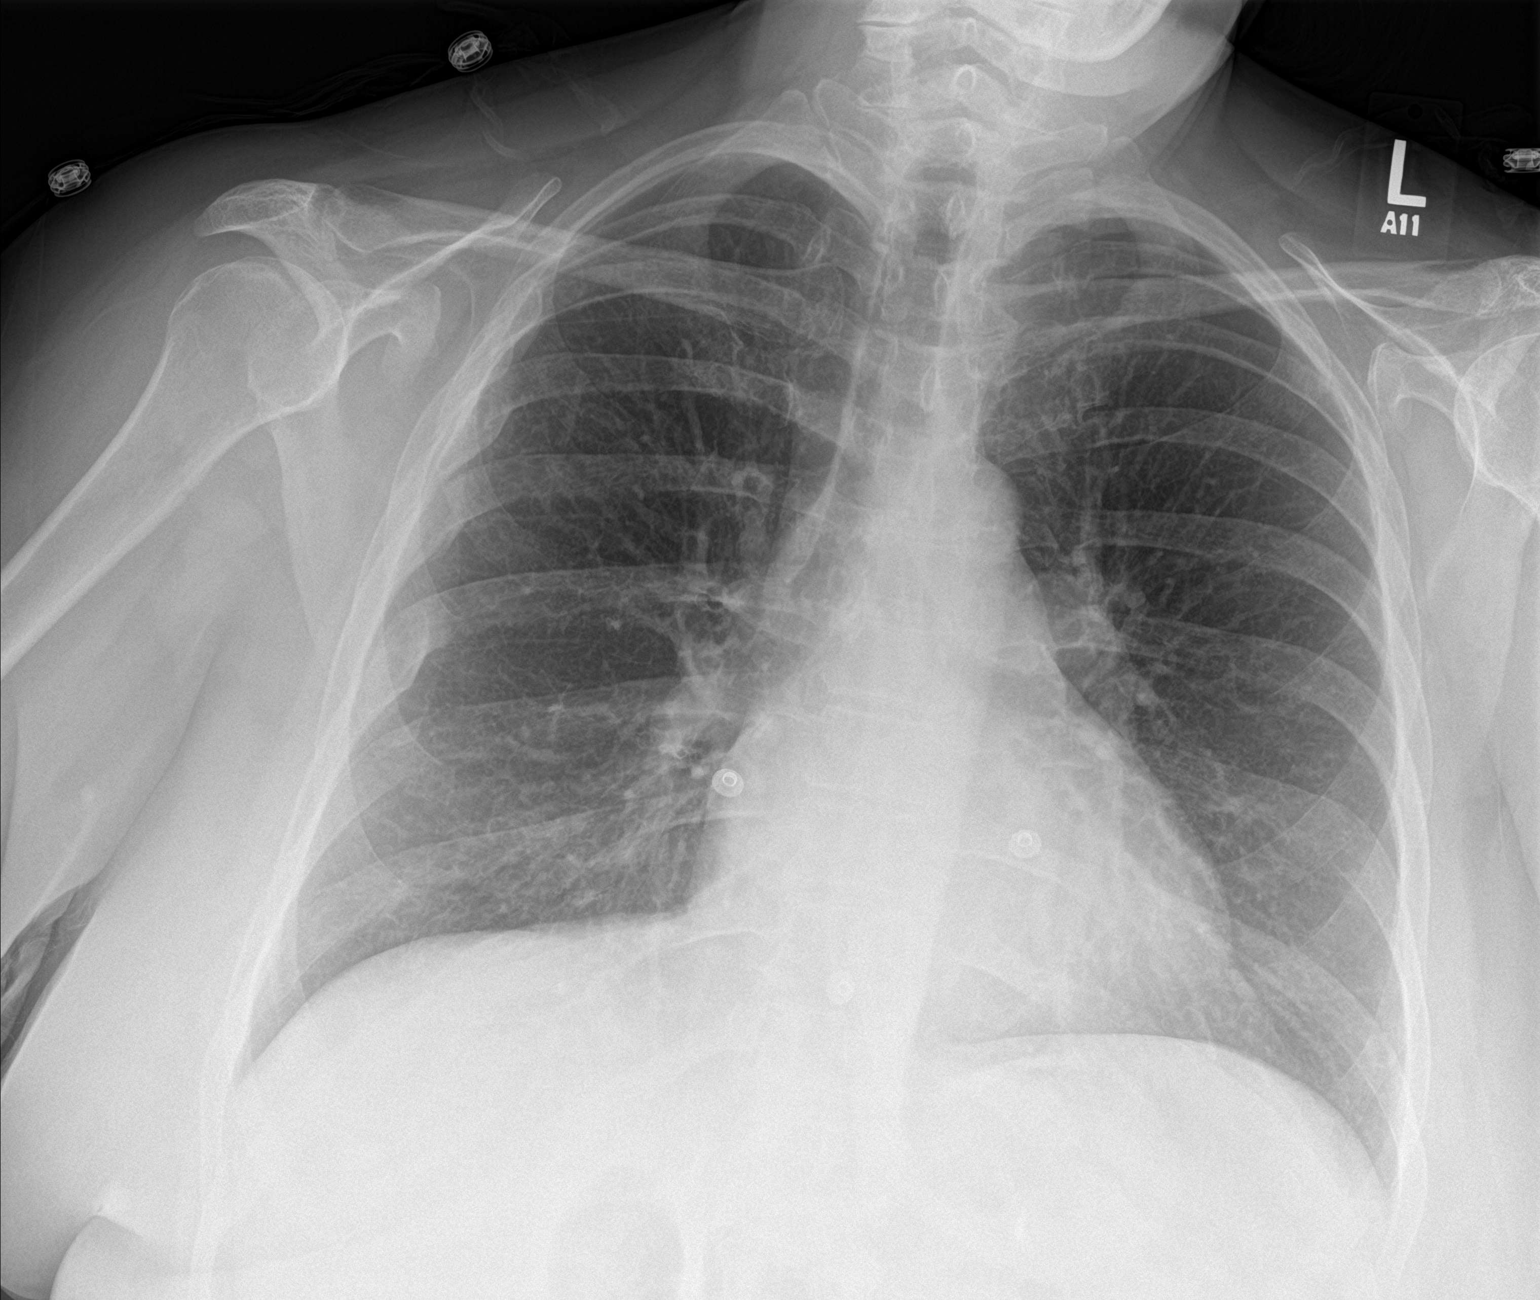

[chest lat]
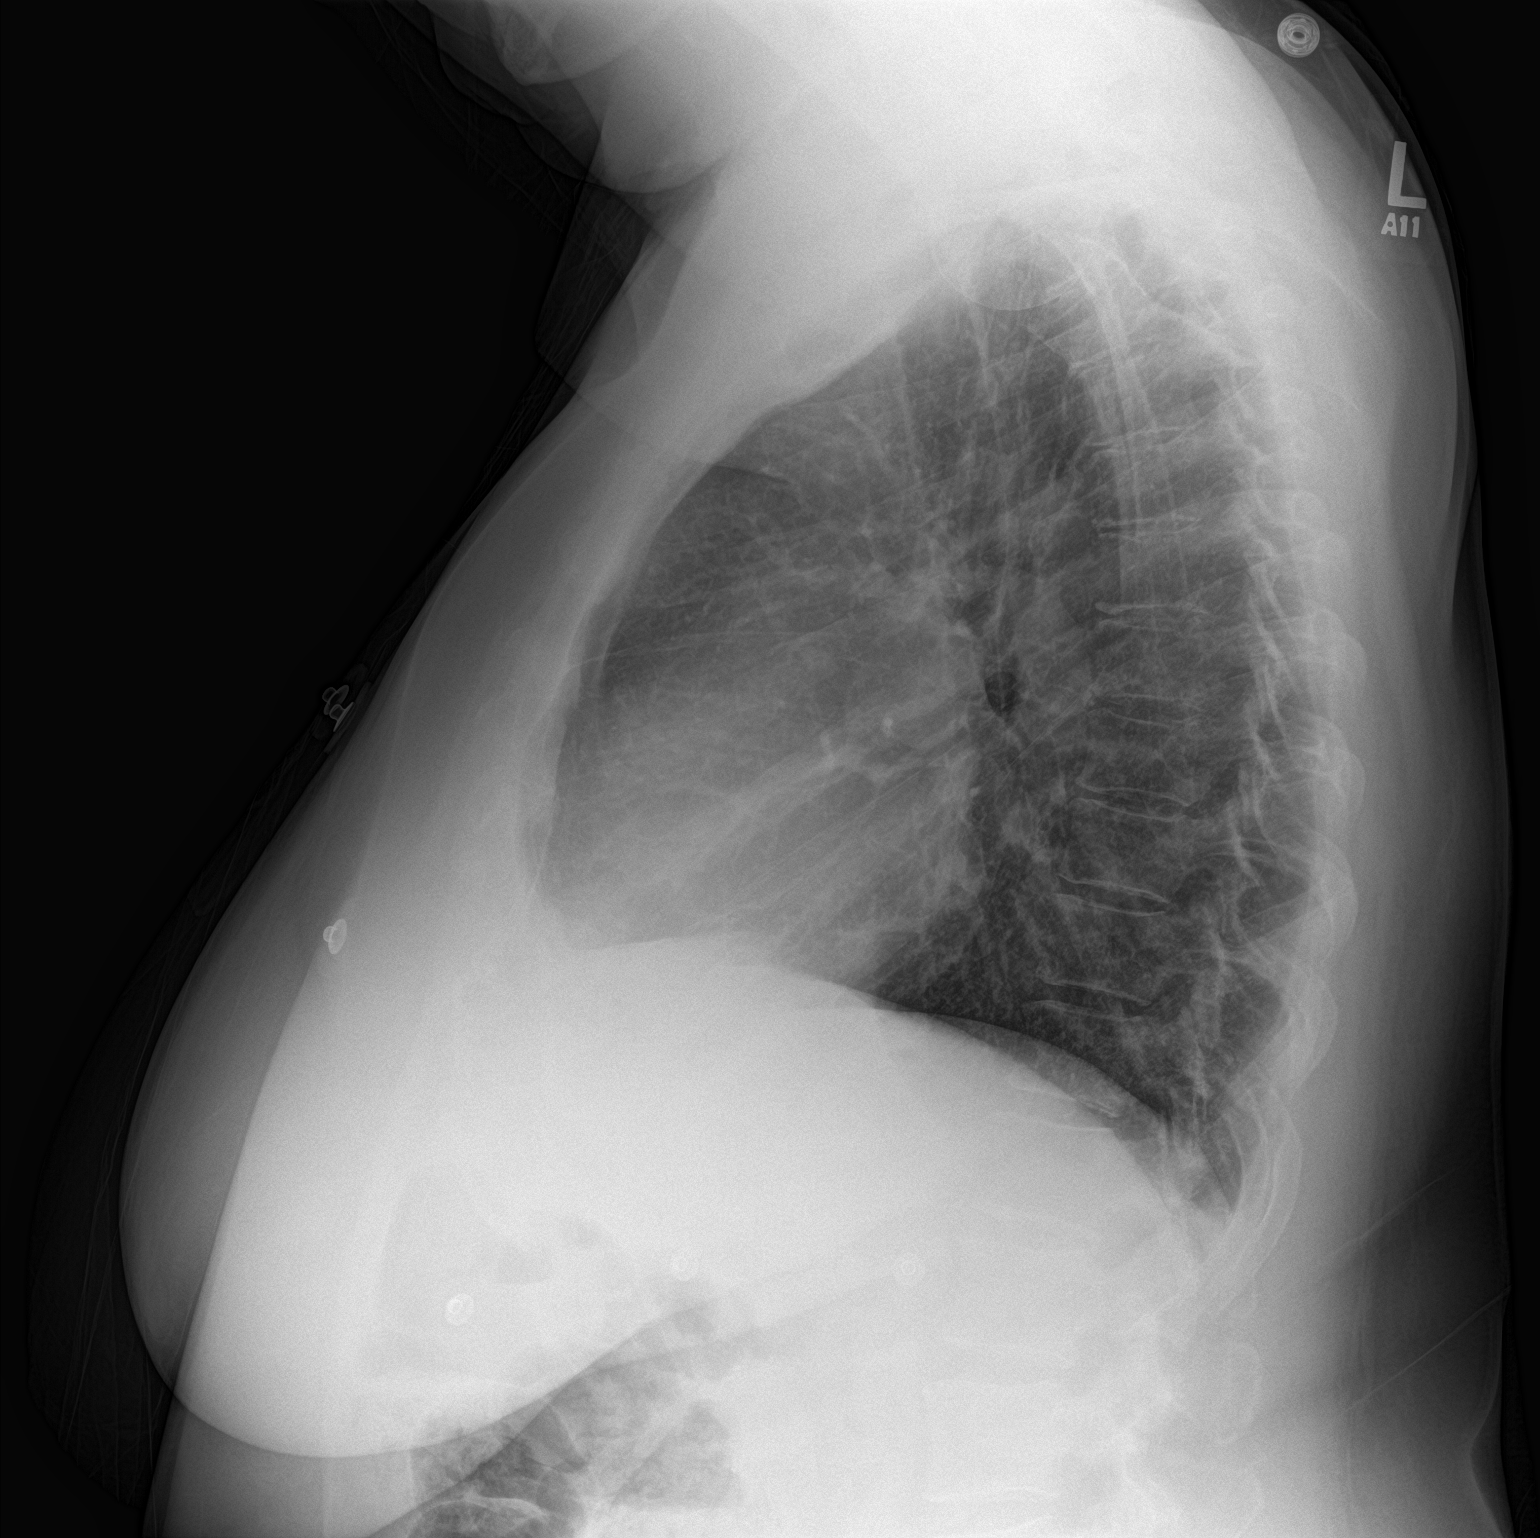

[2 of 2 positions shown; findings below may reference images not displayed]

FINDINGS: No active infiltrate or effusion is seen. Mediastinal and hilar
contours are unremarkable. The heart is within upper limits of
normal. Healed right rib fractures are again noted.
IMPRESSION: No active cardiopulmonary disease.

## 2018-05-21 IMAGING — XA IR THROMB F/U EVAL ART/VEN FINAL DAY
1 series · 2 of 2 positions shown · non-contrast
Comparison: 06/25/2016

INDICATION: Status post initiation of bilateral ultrasound assisted thrombolytic
therapy for submassive bilateral pulmonary embolism with right heart
strain yesterday. 12 hour infusion of tPA bilaterally via EKOS
catheters has been completed and she returns for repeat pulmonary
artery pressure measurements and removal of the infusion catheters
and femoral sheaths.

EXAM:
IR THROMB F/U EVAL AROONACHELLUM/DUMMONG FINAL DAY
TECHNIQUE: Fluoroscopy was performed to confirm infusion catheter positioning
in the pulmonary arteries bilaterally. Pulmonary arterial pressures
were obtained. Both infusion catheters and sheaths were removed and
hemostasis obtained with manual compression over the right groin.

[Series 1: fl (-) angio · 2 of 2 slices shown]
[im 1/2]
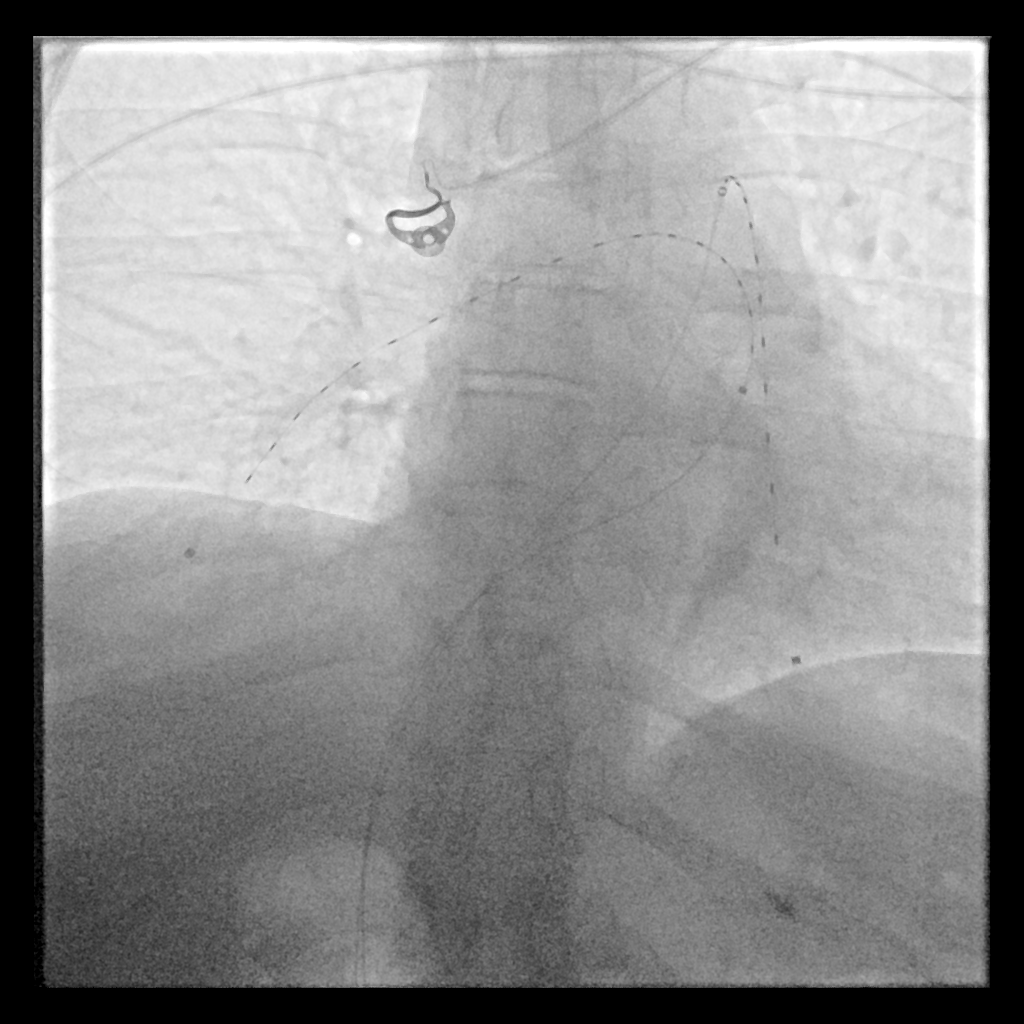
[im 2/2]
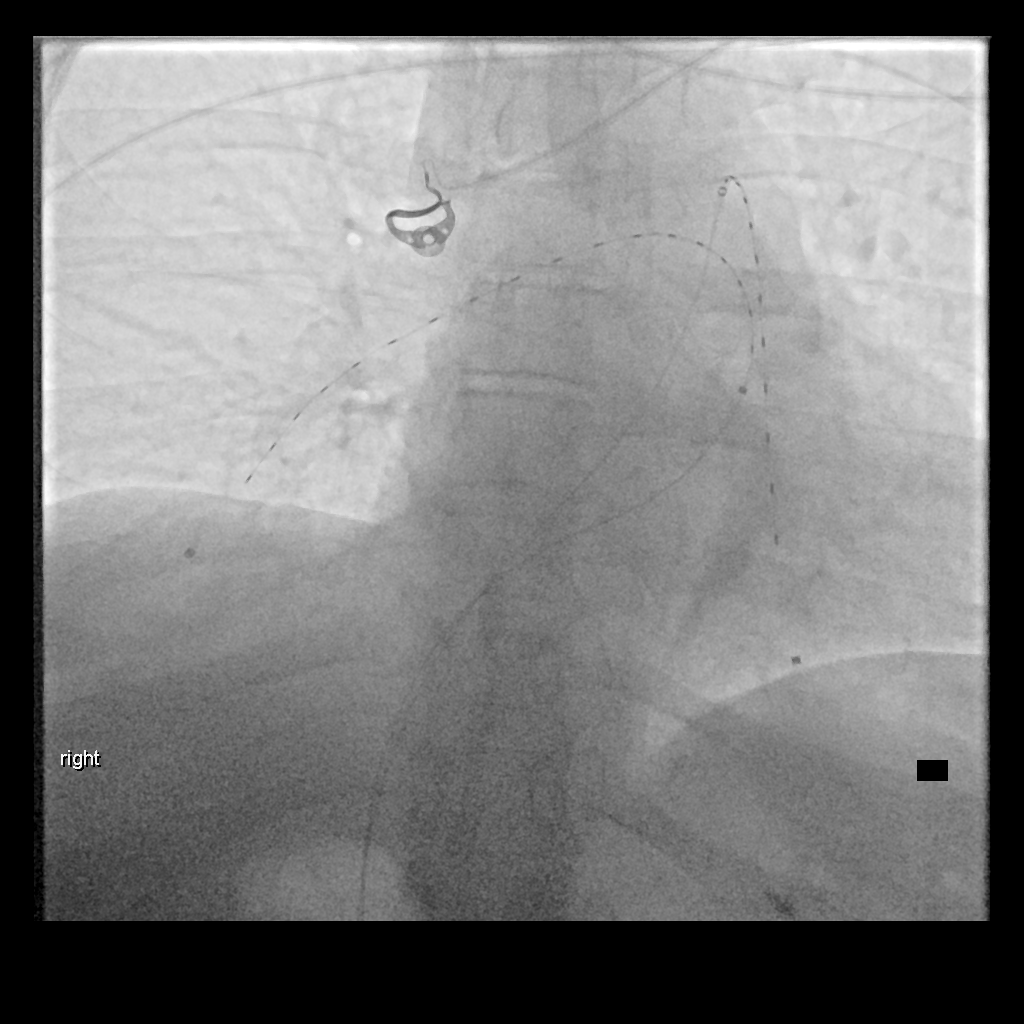

[2 of 2 positions shown; findings below may reference images not displayed]

MEDICATIONS:
None.

ANESTHESIA/SEDATION:
None

FLUOROSCOPY TIME:  Fluoroscopy Time: 6 seconds.  0.3 mGy.

COMPLICATIONS:
None.
FINDINGS: Repeat pulmonary artery pressure is 44/29 (35) mm Hg. This is
compared to pre-procedural pulmonary artery pressure measurement of
55/33 (42) mm Hg. The patient does have a history of prior pulmonary
embolism and some degree of pulmonary arterial hypertension may have
been present prior the current acute pulmonary embolic event. Due to
improvement in pressures, no further thrombolytic infusion will be
continued and catheters and sheaths were removed today.
IMPRESSION: Decrease in measured pulmonary artery pressures after bilateral
ultrasound assisted catheter thrombolytic therapy to treat bilateral
pulmonary embolism.

## 2018-07-08 ENCOUNTER — Other Ambulatory Visit: Payer: Self-pay | Admitting: Neurosurgery

## 2018-07-08 ENCOUNTER — Telehealth: Payer: Self-pay | Admitting: Pulmonary Disease

## 2018-07-08 NOTE — Telephone Encounter (Signed)
Attempted to contact pt. I did not receive an answer. There was no option for me to leave a message. Will try back.  

## 2018-07-11 NOTE — Telephone Encounter (Signed)
LMOM

## 2018-07-12 NOTE — Telephone Encounter (Signed)
ATC pt, no answer. Left message for pt to call back.  

## 2018-07-13 NOTE — Telephone Encounter (Signed)
LMTCB. We need to know what she needs surgical clearance for, what MD is doing surgery,  as well as check with her provider to see if she needs an office visit.

## 2018-07-13 NOTE — Telephone Encounter (Signed)
Called and spoke to patient, she would like to know how soon should she stop her eliquis prior to her cervical spinal fusion procedure and how soon should she start it back after the surgery?  BQ please advise. Thanks.

## 2018-07-13 NOTE — Telephone Encounter (Signed)
Pt returning call. Pt contact number [phone]

## 2018-07-13 NOTE — Telephone Encounter (Signed)
48 hours prior to surgery

## 2018-07-14 NOTE — Telephone Encounter (Signed)
Called and spoke with patient. Dr. Vertell Limber wants Dr. Lake Bells for surgical clearance prior to her surgery on 07/22/18. Patient states Dr. Vertell Limber was going to reach out to Dr. Lake Bells for instructions regarding the Eliquis.   Following up if medical clearance has been addressed and Dr. Donald Pore office is aware?  Please clarify Dr. Lake Bells.

## 2018-07-14 NOTE — Pre-Procedure Instructions (Signed)
Joyce Payne  07/14/2018      [facility] [place], [address] [place] [address] Phone: [phone] Fax: [phone]    Your procedure is scheduled on October 11th.  Report to [facility] Admitting at 1300 A.M.  Call this number if you have problems the morning of surgery:  [phone]   Remember:  Do not eat or drink after midnight.    Take these medicines the morning of surgery with A SIP OF WATER   Tylenol (if needed)  Abilify  Eye drops (if needed)  Cymbalta  Ativan  Omeprazole (if needed)  Lyrica (if needed)  Zanaflex (if needed)  Tramadol (If needed)  Follow your surgeon's instructions for when to stop taking your Eliquis.  7 days prior to surgery STOP taking any Aspirin(unless otherwise instructed by your surgeon), Aleve, Naproxen, Ibuprofen, Motrin, Advil, Goody's, BC's, all herbal medications, fish oil, and all vitamins     Do not wear jewelry, make-up or nail polish.  Do not wear lotions, powders, or perfumes, or deodorant.  Do not shave 48 hours prior to surgery.  Men may shave face and neck.  Do not bring valuables to the hospital.  [facility] is not responsible for any belongings or valuables.  Contacts, dentures or bridgework may not be worn into surgery.  Leave your suitcase in the car.  After surgery it may be brought to your room.  For patients admitted to the hospital, discharge time will be determined by your treatment team.  Patients discharged the day of surgery will not be allowed to drive home.    [facility]- Preparing For Surgery  Before surgery, you can play an important role. Because skin is not sterile, your skin needs to be as free of germs as possible. You can reduce the number of germs on your skin by washing with CHG (chlorahexidine gluconate) Soap before surgery.  CHG is an antiseptic cleaner which kills germs and  bonds with the skin to continue killing germs even after washing.    Oral Hygiene is also important to reduce your risk of infection.  Remember - BRUSH YOUR TEETH THE MORNING OF SURGERY WITH YOUR REGULAR TOOTHPASTE  Please do not use if you have an allergy to CHG or antibacterial soaps. If your skin becomes reddened/irritated stop using the CHG.  Do not shave (including legs and underarms) for at least 48 hours prior to first CHG shower. It is OK to shave your face.  Please follow these instructions carefully.   1. Shower the NIGHT BEFORE SURGERY and the MORNING OF SURGERY with CHG.   2. If you chose to wash your hair, wash your hair first as usual with your normal shampoo.  3. After you shampoo, rinse your hair and body thoroughly to remove the shampoo.  4. Use CHG as you would any other liquid soap. You can apply CHG directly to the skin and wash gently with a scrungie or a clean washcloth.   5. Apply the CHG Soap to your body ONLY FROM THE NECK DOWN.  Do not use on open wounds or open sores. Avoid contact with your eyes, ears, mouth and genitals (private parts). Wash Face and genitals (private parts)  with your normal soap.  6. Wash thoroughly, paying special attention to the area where your surgery will be performed.  7. Thoroughly rinse your body with warm  water from the neck down.  8. DO NOT shower/wash with your normal soap after using and rinsing off the CHG Soap.  9. Pat yourself dry with a CLEAN TOWEL.  10. Wear CLEAN PAJAMAS to bed the night before surgery, wear comfortable clothes the morning of surgery  11. Place CLEAN SHEETS on your bed the night of your first shower and DO NOT SLEEP WITH PETS.    Day of Surgery:  Do not apply any deodorants/lotions.  Please wear clean clothes to the hospital/surgery center.   Remember to brush your teeth WITH YOUR REGULAR TOOTHPASTE.    Please read over the following fact sheets that you were given.

## 2018-07-14 NOTE — Telephone Encounter (Signed)
It is fine for her to proceed with surgery, she should be off of Eliquis 48 hours prior to surgery.

## 2018-07-14 NOTE — Telephone Encounter (Signed)
ATC pt, no answer. Left message for pt to call back.  Called Dr. Melven Sartorius office and they are closed. Will call in the morning to follow up. They may need a clearance letter

## 2018-07-15 ENCOUNTER — Encounter (HOSPITAL_COMMUNITY): Payer: Self-pay

## 2018-07-15 ENCOUNTER — Encounter (HOSPITAL_COMMUNITY)
Admission: RE | Admit: 2018-07-15 | Discharge: 2018-07-15 | Disposition: A | Discharge status: Home / Self Care | Payer: BC Managed Care – PPO | Source: Ambulatory Visit | Attending: Neurosurgery | Admitting: Neurosurgery

## 2018-07-15 ENCOUNTER — Other Ambulatory Visit: Payer: Self-pay

## 2018-07-15 DIAGNOSIS — Z01812 Encounter for preprocedural laboratory examination: Secondary | ICD-10-CM | POA: Diagnosis present

## 2018-07-15 HISTORY — DX: Fracture of one rib, unspecified side, initial encounter for closed fracture: S22.39XA

## 2018-07-15 LAB — SURGICAL PCR SCREEN
MRSA, PCR: NEGATIVE
STAPHYLOCOCCUS AUREUS: POSITIVE — AB

## 2018-07-15 LAB — BASIC METABOLIC PANEL
ANION GAP: 8 (ref 5–15)
BUN: 18 mg/dL (ref 6–20)
CHLORIDE: 103 mmol/L (ref 98–111)
CO2: 27 mmol/L (ref 22–32)
Calcium: 9.3 mg/dL (ref 8.9–10.3)
Creatinine, Ser: 0.84 mg/dL (ref 0.44–1.00)
GFR calc Af Amer: >60 mL/min (ref >60)
GLUCOSE: 99 mg/dL (ref 70–99)
Potassium: 4.1 mmol/L (ref 3.5–5.1)
SODIUM: 138 mmol/L (ref 135–145)

## 2018-07-15 LAB — CBC
HCT: 39.8 % (ref 36.0–46.0)
HEMOGLOBIN: 13.1 g/dL (ref 12.0–15.0)
MCH: 31.2 pg (ref 26.0–34.0)
MCHC: 32.9 g/dL (ref 30.0–36.0)
MCV: 94.8 fL (ref 78.0–100.0)
Platelets: 250 10*3/uL (ref 150–400)
RBC: 4.2 MIL/uL (ref 3.87–5.11)
RDW: 12.2 % (ref 11.5–15.5)
WBC: 6.2 10*3/uL (ref 4.0–10.5)

## 2018-07-15 LAB — PROTIME-INR
INR: 1.09
Prothrombin Time: 14 seconds (ref 11.4–15.2)

## 2018-07-15 LAB — TYPE AND SCREEN
ABO/RH(D): B POS
ANTIBODY SCREEN: NEGATIVE

## 2018-07-15 LAB — ABO/RH: ABO/RH(D): B POS

## 2018-07-15 NOTE — Progress Notes (Signed)
PCP - Dr. Lona Kettle Cardiologist - Dr. Einar Gip  Chest x-ray - 02/2018 EKG - 12/2017 Stress Test -2018  ECHO - 2018 Cardiac Cath - denies  Sleep Study - positive sleep study in the past. Does not use CPAP  Blood Thinner Instructions: Eliquis; hold 48 hours prior to surgery.   Anesthesia review: Yes  Patient denies shortness of breath, fever, cough and chest pain at PAT appointment   Patient verbalized understanding of instructions that were given to them at the PAT appointment. Patient was also instructed that they will need to review over the PAT instructions again at home before surgery.

## 2018-07-15 NOTE — Progress Notes (Signed)
Surgical PCR +MSSA. Mupirocin called into [facility] [phone]. Patient made aware.

## 2018-07-15 NOTE — Telephone Encounter (Signed)
Joyce Payne called back, patient already received clearance for surgery.  Called patient let her know Dr. Donald Pore office is aware of the medical clearance and Eliquis instructions per BQ to stop 48 hours prior to surgery.  Nothing further needed.

## 2018-07-15 NOTE — Telephone Encounter (Signed)
Called Dr. Donald Pore office and was transferred to [place]. Left message for her to call back so we could inform her of the rec by Dr. Lake Bells for Eliquis prior to surgery.   Will await call back from Dr. Donald Pore office.

## 2018-07-19 NOTE — H&P (Signed)
Patient ID:   [id number] Patient: Joyce Payne  Date of Birth: 1961-01-15 Visit Type: Office Visit   Date: 06/29/2018 01:00 PM Provider: Marchia Meiers. Vertell Limber MD   This 57 year old female presents for neck pain.  HISTORY OF PRESENT ILLNESS:  1.  neck pain  Joyce Payne, 57 year old female, visits for evaluation on referral from Dr. Lake Bells.  She reports neck and shoulder pain since February 2018. She experienced increased pain in the right shoulder and right arm following physical therapy in August of 2019.   Celebrex 200 mg b.i.d. Initially prescribed for knee pain was recently resumed Lyrica 100 mg once daily initially prescribed for right foot neuropathy in 2008 was recently resumed Tramadol 200-300 mg per day Two courses of prednisone offered no lasting relief  Eliquis taken daily for history of DVT/PE x2  History:  GERD, ovarian cancer 2019 remission, Hodgkin's lymphoma 2011 remission, pulmonary embolus 2008, pulmonary embolus 2017, TMJ, scoliosis Surgical history:  Hysterectomy/ovarian cancer April 2019, cholecystectomy March 2019, DVT/PE September 2017, left foot 2016, left shoulder 2013, biopsy and port placement 2010, lumbar microdiskectomy 2008, patella fracture 2002, gastric bypass 1999, left tumor 1996, tonsillectomy 1985  Cervical MRI [place]  Cervical MRI demonstrates a disc herniation on the left at the C6-7 level causing left C7 nerve root compression.  The patient has bilateral arm pain but is much worse on the left than the right.  [facility] without significant improvement in her symptoms.  She has been through massage therapy.  This has not helped her much.  She notes pain radiating into her left arm through biceps and into her 3rd through 5th digits.  Dr. Tonita Cong has performed extensive evaluation and has been treating her for this condition.  He has recommended after failure of conservative  management to proceed with anterior cervical decompression and fusion at the C6-7 level and was planning on transferring her care to 1 of his partners but she asked to be seen by me as she wanted to have neck surgery performed by a neurosurgeon rather than an orthopedic surgeon.  After reviewing her situation and her imaging I entirely agree with Dr. Reather Littler surgical recommendations.          PAST MEDICAL/SURGICAL HISTORY:   (Detailed)       PAST MEDICAL HISTORY, SURGICAL HISTORY, FAMILY HISTORY, SOCIAL HISTORY AND REVIEW OF SYSTEMS I have reviewed the patient's past medical, surgical, family and social history as well as the comprehensive review of systems as included on the [facility] history form dated 06/29/2018, which I have signed.  Family History:  (Detailed)   Social History:  (Detailed) Tobacco use reviewed. Preferred language is [place].   Tobacco use status: Ex-cigarette smoker. Smoking status: Former smoker.  SMOKING STATUS Type Smoking Status Usage Per Day Years Used Total Pack Years   Former smoker      TOBACCO/VAPING EXPOSURE No passive vaping exposure. No passive smoke exposure.       MEDICATIONS: (added, continued or stopped this visit) Started Medication Directions Instruction Stopped   celecoxib 200 mg capsule take 1 capsule by oral route  every day as needed     chlordiazepoxide 5 mg capsule take 1 capsule by oral route 3 times every day     duloxetine 60 mg capsule,delayed release take 1 capsule by oral route  every day     Eliquis 5 mg tablet take 1 tablet by oral route  2 times every day     Lyrica 100 mg capsule take 1 capsule by oral route 2 times every day     Toviaz 8 mg tablet,extended release take 1 tablet by oral route  every day     tramadol ER 100 mg tablet,extended release 24 hr take 1 tablet by oral route 2 times every day     trazodone 150 mg tablet take 1 tablet by oral route 2 times every day after meals        ALLERGIES: Ingredient Reaction Medication Name Comment  LATEX     POVIDONE-IODINE  Betadine   SOAP  Betadine    Reviewed, updated.   REVIEW OF SYSTEMS   See scanned patient registration form, dated 06/29/2018, signed and dated on 07/02/2018  Review of Systems Details System Neg/Pos Details  Constitutional Negative Chills, Fatigue, Fever, Malaise, Night sweats, Weight gain and Weight loss.  ENMT Negative Ear drainage, Hearing loss, Nasal drainage, Otalgia, Sinus pressure and Sore throat.  Eyes Negative Eye discharge, Eye pain and Vision changes.  Respiratory Negative Chronic cough, Cough, Dyspnea, Known TB exposure and Wheezing.  Cardio Negative Chest pain, Claudication, Edema and Irregular heartbeat/palpitations.  GI Negative Abdominal pain, Blood in stool, Change in stool pattern, Constipation, Decreased appetite, Diarrhea, Heartburn, Nausea and Vomiting.  GU Negative Dysuria, Hematuria, Polyuria (Genitourinary), Urinary frequency, Urinary incontinence and Urinary retention.  Endocrine Negative Cold intolerance, Heat intolerance, Polydipsia and Polyphagia.  Neuro Positive Headache.  Psych Negative Anxiety, Depression and Insomnia.  Integumentary Negative Brittle hair, Brittle nails, Change in shape/size of mole(s), Hair loss, Hirsutism, Hives, Pruritus, Rash and Skin lesion.  MS Positive Neck pain.  Hema/Lymph Negative Easy bleeding, Easy bruising and Lymphadenopathy.  Allergic/Immuno Negative Contact allergy, Environmental allergies, Food allergies and Seasonal allergies.  Reproductive Negative Breast discharge, Breast lumps, Dysmenorrhea, Dyspareunia, History of abnormal PAP smear, Hot flashes, Irregular menses and Vaginal discharge.   PHYSICAL EXAM:   Vitals Date Temp F BP Pulse Ht In Wt Lb BMI BSA Pain Score  06/29/2018  145/78 64 66 231 37.28  8/10    PHYSICAL EXAM Details General Level of Distress: no acute distress Overall Appearance: normal  Head and  Face  Right Left  Fundoscopic Exam:  normal normal    Cardiovascular Cardiac: regular rate and rhythm without murmur  Right Left  Carotid Pulses: normal normal  Respiratory Lungs: clear to auscultation  Neurological Orientation: normal Recent and Remote Memory: normal Attention Span and Concentration:   normal Language: normal Fund of Knowledge: normal  Right Left Sensation: normal normal Upper Extremity Coordination: normal normal  Lower Extremity Coordination: normal normal  Musculoskeletal Gait and Station: normal  Right Left Upper Extremity Muscle Strength: normal normal Lower Extremity Muscle Strength: normal normal Upper Extremity Muscle Tone:  normal normal Lower Extremity Muscle Tone: normal normal   Motor Strength Upper and lower extremity motor strength was tested in the clinically pertinent muscles. Any abnormal findings will be noted below.   Right Left Triceps:  4/5   Deep Tendon Reflexes  Right Left Biceps: normal normal Triceps: normal normal Brachioradialis: normal normal Patellar: normal normal Achilles: normal normal  Sensory Sensation was tested at C2 to T1. Any abnormal findings will be noted below.  Right Left C7:  decreased  Cranial Nerves II. Optic Nerve/Visual Fields: normal III. Oculomotor: normal IV. Trochlear: normal V. Trigeminal: normal VI. Abducens: normal VII. Facial: normal VIII. Acoustic/Vestibular: normal IX. Glossopharyngeal: normal X. Vagus: normal XI. Spinal Accessory: normal XII. Hypoglossal: normal  Motor  and other Tests Lhermittes: negative Rhomberg: negative Pronator drift: absent     Right Left Spurlings positive positive Hoffman's: normal normal Clonus: normal normal Babinski: normal normal SLR: negative negative Patrick's Corky Sox): negative negative Toe Walk: normal normal Toe Lift: normal normal Heel Walk: normal normal Tinels Elbow: negative negative Tinels  Wrist: negative negative Phalen: negative negative   Additional Findings:  Left wrist flexion 4/5    IMPRESSION:   The patient has a herniated disc at C6-7 on the left with left arm weakness.  This has been refractory to conservative management.  She has a history of multiple malignancies and deep venous thrombosis and PE for which she is on Eliquis.  Dr. Lake Bells is her pulmonologist.  I have recommended to the patient that she undergo anterior cervical decompression and fusion at the C6-7 level.  Patient is on an anti-coagulant, anti-inflammatory or supplement that may increase bleeding time. Patient advised to stop medicine prior to surgery.  Comments:  Will need surgical clearance from Dr. Lake Bells, with possible plan for Lovenox bridging postop  PLAN:  Patient will need to be cleared to be off Eliquis and possibly have bridging Lovenox by her pulmonologist.  Plan once she is cleared is to proceed with anterior cervical decompression and fusion at the C6-7 level.  This will be done at [facility].  Orders: Diagnostic Procedures: Assessment Procedure  M54.12 Cervical Spine- Lateral  Miscellaneous: Assessment   M50.20 Soft Collar Regular ([id number])   Assessment/Plan   # Detail Type Description   1. Assessment Cervicalgia (M54.2).       2. Assessment At risk for hemorrhage associated with anticoagulation therapy (Z91.89).       3. Assessment Herniated nucleus pulposus, cervical (M50.20).   Plan Orders Soft Collar Regular [phone]).       4. Assessment Cervical radiculopathy (M54.12).                     Provider:  Marchia Meiers. Vertell Limber MD  07/02/2018 05:35 PM Dictation edited by: Marchia Meiers. Vertell Limber    CC Providers: Gus Height [place] [id number] [place],  [place]  [id number]   Bathsheba Durrett MD  [address] [place], [place] [address]               Electronically signed by Marchia Meiers. Vertell Limber MD on 07/02/2018 05:35 PM

## 2018-07-22 ENCOUNTER — Observation Stay (HOSPITAL_COMMUNITY)
Admission: AD | Admit: 2018-07-22 | Discharge: 2018-07-23 | DRG: 473 | Disposition: A | Discharge status: Home / Self Care | Payer: BC Managed Care – PPO | Attending: Neurosurgery | Admitting: Neurosurgery

## 2018-07-22 ENCOUNTER — Ambulatory Visit (HOSPITAL_COMMUNITY): Payer: BC Managed Care – PPO

## 2018-07-22 ENCOUNTER — Encounter (HOSPITAL_COMMUNITY)
Admission: AD | Disposition: A | Discharge status: Home / Self Care | Payer: Self-pay | Source: Home / Self Care | Attending: Neurosurgery

## 2018-07-22 ENCOUNTER — Ambulatory Visit (HOSPITAL_COMMUNITY): Payer: BC Managed Care – PPO | Admitting: Certified Registered Nurse Anesthetist

## 2018-07-22 ENCOUNTER — Other Ambulatory Visit: Payer: Self-pay

## 2018-07-22 ENCOUNTER — Encounter (HOSPITAL_COMMUNITY): Payer: Self-pay

## 2018-07-22 DIAGNOSIS — Z7901 Long term (current) use of anticoagulants: Secondary | ICD-10-CM | POA: Insufficient documentation

## 2018-07-22 DIAGNOSIS — K219 Gastro-esophageal reflux disease without esophagitis: Secondary | ICD-10-CM | POA: Diagnosis not present

## 2018-07-22 DIAGNOSIS — Z91048 Other nonmedicinal substance allergy status: Secondary | ICD-10-CM

## 2018-07-22 DIAGNOSIS — Z86711 Personal history of pulmonary embolism: Secondary | ICD-10-CM | POA: Diagnosis not present

## 2018-07-22 DIAGNOSIS — Z8543 Personal history of malignant neoplasm of ovary: Secondary | ICD-10-CM

## 2018-07-22 DIAGNOSIS — M1990 Unspecified osteoarthritis, unspecified site: Secondary | ICD-10-CM | POA: Insufficient documentation

## 2018-07-22 DIAGNOSIS — Z90710 Acquired absence of both cervix and uterus: Secondary | ICD-10-CM | POA: Diagnosis not present

## 2018-07-22 DIAGNOSIS — F419 Anxiety disorder, unspecified: Secondary | ICD-10-CM | POA: Insufficient documentation

## 2018-07-22 DIAGNOSIS — Z888 Allergy status to other drugs, medicaments and biological substances status: Secondary | ICD-10-CM | POA: Insufficient documentation

## 2018-07-22 DIAGNOSIS — Z79899 Other long term (current) drug therapy: Secondary | ICD-10-CM | POA: Diagnosis not present

## 2018-07-22 DIAGNOSIS — Z9884 Bariatric surgery status: Secondary | ICD-10-CM | POA: Diagnosis not present

## 2018-07-22 DIAGNOSIS — M419 Scoliosis, unspecified: Secondary | ICD-10-CM | POA: Insufficient documentation

## 2018-07-22 DIAGNOSIS — F329 Major depressive disorder, single episode, unspecified: Secondary | ICD-10-CM | POA: Insufficient documentation

## 2018-07-22 DIAGNOSIS — Z86718 Personal history of other venous thrombosis and embolism: Secondary | ICD-10-CM | POA: Diagnosis not present

## 2018-07-22 DIAGNOSIS — Z6836 Body mass index (BMI) 36.0-36.9, adult: Secondary | ICD-10-CM | POA: Insufficient documentation

## 2018-07-22 DIAGNOSIS — E669 Obesity, unspecified: Secondary | ICD-10-CM | POA: Insufficient documentation

## 2018-07-22 DIAGNOSIS — Z8571 Personal history of Hodgkin lymphoma: Secondary | ICD-10-CM

## 2018-07-22 DIAGNOSIS — M50123 Cervical disc disorder at C6-C7 level with radiculopathy: Secondary | ICD-10-CM | POA: Diagnosis not present

## 2018-07-22 DIAGNOSIS — Z91040 Latex allergy status: Secondary | ICD-10-CM

## 2018-07-22 DIAGNOSIS — Z87891 Personal history of nicotine dependence: Secondary | ICD-10-CM | POA: Diagnosis not present

## 2018-07-22 DIAGNOSIS — M4722 Other spondylosis with radiculopathy, cervical region: Secondary | ICD-10-CM | POA: Diagnosis not present

## 2018-07-22 DIAGNOSIS — Z9049 Acquired absence of other specified parts of digestive tract: Secondary | ICD-10-CM | POA: Diagnosis not present

## 2018-07-22 DIAGNOSIS — M5020 Other cervical disc displacement, unspecified cervical region: Secondary | ICD-10-CM | POA: Diagnosis present

## 2018-07-22 HISTORY — PX: ANTERIOR CERVICAL DECOMP/DISCECTOMY FUSION: SHX1161

## 2018-07-22 LAB — PROTIME-INR
INR: 1.07
PROTHROMBIN TIME: 13.8 s (ref 11.4–15.2)

## 2018-07-22 SURGERY — ANTERIOR CERVICAL DECOMPRESSION/DISCECTOMY FUSION 1 LEVEL
Anesthesia: General

## 2018-07-22 MED ORDER — LIDOCAINE HCL (CARDIAC) PF 100 MG/5ML IV SOSY
PREFILLED_SYRINGE | INTRAVENOUS | Status: DC | PRN
  Administered 2018-07-22: 60 mg via INTRAVENOUS

## 2018-07-22 MED ORDER — ACETAMINOPHEN 650 MG RE SUPP: 650.0000 mg | RECTAL | Status: DC | PRN

## 2018-07-22 MED ORDER — PHENYLEPHRINE 40 MCG/ML (10ML) SYRINGE FOR IV PUSH (FOR BLOOD PRESSURE SUPPORT)
PREFILLED_SYRINGE | INTRAVENOUS | Status: AC
  Filled 2018-07-22: qty 20

## 2018-07-22 MED ORDER — FLEET ENEMA 7-19 GM/118ML RE ENEM: 1.0000 | ENEMA | Freq: Once | RECTAL | Status: DC | PRN

## 2018-07-22 MED ORDER — MIDAZOLAM HCL 2 MG/2ML IJ SOLN
INTRAMUSCULAR | Status: AC
  Filled 2018-07-22: qty 2

## 2018-07-22 MED ORDER — 0.9 % SODIUM CHLORIDE (POUR BTL) OPTIME
TOPICAL | Status: DC | PRN
  Administered 2018-07-22: 1000 mL

## 2018-07-22 MED ORDER — HYDROMORPHONE HCL 1 MG/ML IJ SOLN
0.2500 mg | INTRAMUSCULAR | Status: DC | PRN
  Administered 2018-07-22 (×4): 0.5 mg via INTRAVENOUS

## 2018-07-22 MED ORDER — ZOLPIDEM TARTRATE 5 MG PO TABS: 5.0000 mg | ORAL_TABLET | Freq: Every evening | ORAL | Status: DC | PRN

## 2018-07-22 MED ORDER — ACETAMINOPHEN 500 MG PO TABS
1000.0000 mg | ORAL_TABLET | Freq: Two times a day (BID) | ORAL | Status: DC
  Administered 2018-07-22: 1000 mg via ORAL
  Filled 2018-07-22: qty 2

## 2018-07-22 MED ORDER — TIZANIDINE HCL 4 MG PO TABS: 2.0000 mg | ORAL_TABLET | Freq: Three times a day (TID) | ORAL | Status: DC | PRN

## 2018-07-22 MED ORDER — DULOXETINE HCL 30 MG PO CPEP: 60.0000 mg | ORAL_CAPSULE | Freq: Every day | ORAL | Status: DC

## 2018-07-22 MED ORDER — FENTANYL CITRATE (PF) 250 MCG/5ML IJ SOLN
INTRAMUSCULAR | Status: AC
  Filled 2018-07-22: qty 5

## 2018-07-22 MED ORDER — SUGAMMADEX SODIUM 200 MG/2ML IV SOLN
INTRAVENOUS | Status: DC | PRN
  Administered 2018-07-22: 250 mg via INTRAVENOUS

## 2018-07-22 MED ORDER — KCL IN DEXTROSE-NACL 20-5-0.45 MEQ/L-%-% IV SOLN: INTRAVENOUS | Status: DC

## 2018-07-22 MED ORDER — BUPIVACAINE HCL (PF) 0.5 % IJ SOLN
INTRAMUSCULAR | Status: AC
  Filled 2018-07-22: qty 30

## 2018-07-22 MED ORDER — BISACODYL 10 MG RE SUPP: 10.0000 mg | Freq: Every day | RECTAL | Status: DC | PRN

## 2018-07-22 MED ORDER — VITAMIN C 500 MG PO TABS
1000.0000 mg | ORAL_TABLET | Freq: Every day | ORAL | Status: DC
  Filled 2018-07-22: qty 2

## 2018-07-22 MED ORDER — CYCLOSPORINE 0.05 % OP EMUL
1.0000 [drp] | Freq: Two times a day (BID) | OPHTHALMIC | Status: DC
  Administered 2018-07-22: 1 [drp] via OPHTHALMIC
  Filled 2018-07-22 (×2): qty 1

## 2018-07-22 MED ORDER — ROCURONIUM BROMIDE 100 MG/10ML IV SOLN
INTRAVENOUS | Status: DC | PRN
  Administered 2018-07-22: 10 mg via INTRAVENOUS
  Administered 2018-07-22: 50 mg via INTRAVENOUS

## 2018-07-22 MED ORDER — TRAMADOL HCL 50 MG PO TABS: 100.0000 mg | ORAL_TABLET | Freq: Three times a day (TID) | ORAL | Status: DC | PRN

## 2018-07-22 MED ORDER — ONDANSETRON HCL 4 MG PO TABS
4.0000 mg | ORAL_TABLET | Freq: Four times a day (QID) | ORAL | Status: DC | PRN
  Administered 2018-07-22: 4 mg via ORAL
  Filled 2018-07-22: qty 1

## 2018-07-22 MED ORDER — DEXAMETHASONE SODIUM PHOSPHATE 4 MG/ML IJ SOLN
INTRAMUSCULAR | Status: DC | PRN
  Administered 2018-07-22: 10 mg via INTRAVENOUS

## 2018-07-22 MED ORDER — MAGNESIUM CITRATE PO SOLN: 0.5000 | Freq: Two times a day (BID) | ORAL | Status: DC

## 2018-07-22 MED ORDER — PROPOFOL 10 MG/ML IV BOLUS
INTRAVENOUS | Status: AC
  Filled 2018-07-22: qty 20

## 2018-07-22 MED ORDER — BUPIVACAINE HCL (PF) 0.5 % IJ SOLN
INTRAMUSCULAR | Status: DC | PRN
  Administered 2018-07-22: 5 mL

## 2018-07-22 MED ORDER — POLYETHYLENE GLYCOL 3350 17 G PO PACK: 17.0000 g | PACK | Freq: Every day | ORAL | Status: DC | PRN

## 2018-07-22 MED ORDER — THROMBIN 5000 UNITS EX SOLR
CUTANEOUS | Status: AC
  Filled 2018-07-22: qty 5000

## 2018-07-22 MED ORDER — HYDROMORPHONE HCL 1 MG/ML IJ SOLN
INTRAMUSCULAR | Status: AC
  Administered 2018-07-22: 0.5 mg via INTRAVENOUS
  Filled 2018-07-22: qty 1

## 2018-07-22 MED ORDER — OXYCODONE HCL 5 MG PO TABS: 5.0000 mg | ORAL_TABLET | Freq: Once | ORAL | Status: DC | PRN

## 2018-07-22 MED ORDER — OXYCODONE HCL 5 MG/5ML PO SOLN: 5.0000 mg | Freq: Once | ORAL | Status: DC | PRN

## 2018-07-22 MED ORDER — MORPHINE SULFATE (PF) 2 MG/ML IV SOLN
2.0000 mg | INTRAVENOUS | Status: DC | PRN
  Administered 2018-07-22: 2 mg via INTRAVENOUS
  Filled 2018-07-22: qty 1

## 2018-07-22 MED ORDER — SODIUM CHLORIDE 0.9% FLUSH: 3.0000 mL | INTRAVENOUS | Status: DC | PRN

## 2018-07-22 MED ORDER — SODIUM CHLORIDE 0.9 % IV SOLN: 250.0000 mL | INTRAVENOUS | Status: DC

## 2018-07-22 MED ORDER — CHLORHEXIDINE GLUCONATE CLOTH 2 % EX PADS: 6.0000 | MEDICATED_PAD | Freq: Once | CUTANEOUS | Status: DC

## 2018-07-22 MED ORDER — ACETAMINOPHEN 325 MG PO TABS
650.0000 mg | ORAL_TABLET | ORAL | Status: DC | PRN
  Administered 2018-07-22 – 2018-07-23 (×2): 650 mg via ORAL
  Filled 2018-07-22 (×2): qty 2

## 2018-07-22 MED ORDER — ADULT MULTIVITAMIN W/MINERALS CH: 1.0000 | ORAL_TABLET | Freq: Every day | ORAL | Status: DC

## 2018-07-22 MED ORDER — LORAZEPAM 0.5 MG PO TABS
0.2500 mg | ORAL_TABLET | Freq: Three times a day (TID) | ORAL | Status: DC
  Administered 2018-07-22 – 2018-07-23 (×2): 0.25 mg via ORAL
  Filled 2018-07-22 (×2): qty 1

## 2018-07-22 MED ORDER — LIDOCAINE-EPINEPHRINE 1 %-1:100000 IJ SOLN
INTRAMUSCULAR | Status: DC | PRN
  Administered 2018-07-22: 5 mL

## 2018-07-22 MED ORDER — PROMETHAZINE HCL 25 MG/ML IJ SOLN: 6.2500 mg | INTRAMUSCULAR | Status: DC | PRN

## 2018-07-22 MED ORDER — SODIUM CHLORIDE 0.9% FLUSH: 3.0000 mL | Freq: Two times a day (BID) | INTRAVENOUS | Status: DC

## 2018-07-22 MED ORDER — CEFAZOLIN SODIUM-DEXTROSE 2-4 GM/100ML-% IV SOLN
2.0000 g | INTRAVENOUS | Status: AC
  Administered 2018-07-22: 2 g via INTRAVENOUS

## 2018-07-22 MED ORDER — PANTOPRAZOLE SODIUM 40 MG PO TBEC: 40.0000 mg | DELAYED_RELEASE_TABLET | Freq: Every day | ORAL | Status: DC

## 2018-07-22 MED ORDER — DOCUSATE SODIUM 100 MG PO CAPS
100.0000 mg | ORAL_CAPSULE | Freq: Two times a day (BID) | ORAL | Status: DC
  Administered 2018-07-22: 100 mg via ORAL
  Filled 2018-07-22: qty 1

## 2018-07-22 MED ORDER — HYDROCODONE-ACETAMINOPHEN 5-325 MG PO TABS
ORAL_TABLET | ORAL | Status: AC
  Filled 2018-07-22: qty 2

## 2018-07-22 MED ORDER — PHENOL 1.4 % MT LIQD: 1.0000 | OROMUCOSAL | Status: DC | PRN

## 2018-07-22 MED ORDER — METHOCARBAMOL 500 MG PO TABS
500.0000 mg | ORAL_TABLET | Freq: Four times a day (QID) | ORAL | Status: DC | PRN
  Administered 2018-07-22: 500 mg via ORAL
  Filled 2018-07-22: qty 1

## 2018-07-22 MED ORDER — FENTANYL CITRATE (PF) 100 MCG/2ML IJ SOLN
INTRAMUSCULAR | Status: DC | PRN
  Administered 2018-07-22 (×2): 100 ug via INTRAVENOUS
  Administered 2018-07-22: 50 ug via INTRAVENOUS

## 2018-07-22 MED ORDER — MIDAZOLAM HCL 5 MG/5ML IJ SOLN
INTRAMUSCULAR | Status: DC | PRN
  Administered 2018-07-22: 2 mg via INTRAVENOUS

## 2018-07-22 MED ORDER — PROPOFOL 10 MG/ML IV BOLUS
INTRAVENOUS | Status: DC | PRN
  Administered 2018-07-22: 150 mg via INTRAVENOUS
  Administered 2018-07-22: 50 mg via INTRAVENOUS

## 2018-07-22 MED ORDER — VITAMIN D 1000 UNITS PO TABS
1000.0000 [IU] | ORAL_TABLET | Freq: Every day | ORAL | Status: DC
  Filled 2018-07-22: qty 1

## 2018-07-22 MED ORDER — METHOCARBAMOL 500 MG PO TABS
ORAL_TABLET | ORAL | Status: AC
  Filled 2018-07-22: qty 1

## 2018-07-22 MED ORDER — ROCURONIUM BROMIDE 50 MG/5ML IV SOSY
PREFILLED_SYRINGE | INTRAVENOUS | Status: AC
  Filled 2018-07-22: qty 5

## 2018-07-22 MED ORDER — OXYCODONE HCL 5 MG PO TABS
10.0000 mg | ORAL_TABLET | ORAL | Status: DC | PRN
  Administered 2018-07-22 – 2018-07-23 (×3): 10 mg via ORAL
  Filled 2018-07-22 (×2): qty 2

## 2018-07-22 MED ORDER — ONDANSETRON HCL 4 MG/2ML IJ SOLN: 4.0000 mg | Freq: Four times a day (QID) | INTRAMUSCULAR | Status: DC | PRN

## 2018-07-22 MED ORDER — ARIPIPRAZOLE 5 MG PO TABS
5.0000 mg | ORAL_TABLET | Freq: Every day | ORAL | Status: DC
  Filled 2018-07-22: qty 1

## 2018-07-22 MED ORDER — EPHEDRINE 5 MG/ML INJ
INTRAVENOUS | Status: AC
  Filled 2018-07-22: qty 10

## 2018-07-22 MED ORDER — ALUM & MAG HYDROXIDE-SIMETH 200-200-20 MG/5ML PO SUSP: 30.0000 mL | Freq: Four times a day (QID) | ORAL | Status: DC | PRN

## 2018-07-22 MED ORDER — METHOCARBAMOL 1000 MG/10ML IJ SOLN
500.0000 mg | Freq: Four times a day (QID) | INTRAVENOUS | Status: DC | PRN
  Administered 2018-07-22: 500 mg via INTRAVENOUS
  Filled 2018-07-22: qty 5
  Filled 2018-07-22: qty 500

## 2018-07-22 MED ORDER — PREGABALIN 50 MG PO CAPS: 100.0000 mg | ORAL_CAPSULE | Freq: Every day | ORAL | Status: DC | PRN

## 2018-07-22 MED ORDER — LIDOCAINE-EPINEPHRINE 1 %-1:100000 IJ SOLN
INTRAMUSCULAR | Status: AC
  Filled 2018-07-22: qty 1

## 2018-07-22 MED ORDER — ONDANSETRON HCL 4 MG/2ML IJ SOLN
INTRAMUSCULAR | Status: DC | PRN
  Administered 2018-07-22: 4 mg via INTRAVENOUS

## 2018-07-22 MED ORDER — THROMBIN 5000 UNITS EX SOLR
OROMUCOSAL | Status: DC | PRN
  Administered 2018-07-22: 15:00:00 via TOPICAL

## 2018-07-22 MED ORDER — OXYCODONE HCL 5 MG PO TABS
5.0000 mg | ORAL_TABLET | ORAL | Status: DC | PRN
  Filled 2018-07-22 (×2): qty 1

## 2018-07-22 MED ORDER — CEFAZOLIN SODIUM-DEXTROSE 2-4 GM/100ML-% IV SOLN
2.0000 g | Freq: Three times a day (TID) | INTRAVENOUS | Status: AC
  Administered 2018-07-22 – 2018-07-23 (×2): 2 g via INTRAVENOUS
  Filled 2018-07-22 (×2): qty 100

## 2018-07-22 MED ORDER — TRAZODONE HCL 150 MG PO TABS
150.0000 mg | ORAL_TABLET | Freq: Every day | ORAL | Status: DC
  Administered 2018-07-22: 150 mg via ORAL
  Filled 2018-07-22: qty 1

## 2018-07-22 MED ORDER — MULTIVITAMINS PO CAPS: 1.0000 | ORAL_CAPSULE | Freq: Every day | ORAL | Status: DC

## 2018-07-22 MED ORDER — CEFAZOLIN SODIUM-DEXTROSE 2-4 GM/100ML-% IV SOLN
INTRAVENOUS | Status: AC
  Filled 2018-07-22: qty 100

## 2018-07-22 MED ORDER — FESOTERODINE FUMARATE ER 8 MG PO TB24
8.0000 mg | ORAL_TABLET | Freq: Every day | ORAL | Status: DC
  Administered 2018-07-22: 8 mg via ORAL
  Filled 2018-07-22: qty 1

## 2018-07-22 MED ORDER — LACTATED RINGERS IV SOLN
INTRAVENOUS | Status: DC
  Administered 2018-07-22 (×2): via INTRAVENOUS

## 2018-07-22 MED ORDER — HYDROCODONE-ACETAMINOPHEN 5-325 MG PO TABS
2.0000 | ORAL_TABLET | ORAL | Status: DC | PRN
  Administered 2018-07-22: 2 via ORAL

## 2018-07-22 MED ORDER — LIDOCAINE 2% (20 MG/ML) 5 ML SYRINGE
INTRAMUSCULAR | Status: AC
  Filled 2018-07-22: qty 5

## 2018-07-22 MED ORDER — LINACLOTIDE 145 MCG PO CAPS
145.0000 ug | ORAL_CAPSULE | Freq: Every day | ORAL | Status: DC
  Filled 2018-07-22: qty 1

## 2018-07-22 MED ORDER — MENTHOL 3 MG MT LOZG
1.0000 | LOZENGE | OROMUCOSAL | Status: DC | PRN
  Filled 2018-07-22: qty 9

## 2018-07-22 SURGICAL SUPPLY — 64 items
BASKET BONE COLLECTION (BASKET) IMPLANT
BIT DRILL 12X2.5XAVTR (BIT) ×1 IMPLANT
BIT DRILL AVIATOR 12 (BIT) ×1
BIT DRILL AVIATOR 12MM (BIT) ×1
BIT DRILL NEURO 2X3.1 SFT TUCH (MISCELLANEOUS) ×1 IMPLANT
BIT DRL 12X2.5XAVTR (BIT) ×1
BLADE ULTRA TIP 2M (BLADE) IMPLANT
BNDG GAUZE ELAST 4 BULKY (GAUZE/BANDAGES/DRESSINGS) IMPLANT
BUR BARREL STRAIGHT FLUTE 4.0 (BURR) ×3 IMPLANT
CANISTER SUCT 3000ML PPV (MISCELLANEOUS) ×3 IMPLANT
CARTRIDGE OIL MAESTRO DRILL (MISCELLANEOUS) ×1 IMPLANT
COVER MAYO STAND STRL (DRAPES) ×3 IMPLANT
COVER WAND RF STERILE (DRAPES) ×3 IMPLANT
DECANTER SPIKE VIAL GLASS SM (MISCELLANEOUS) ×3 IMPLANT
DERMABOND ADVANCED (GAUZE/BANDAGES/DRESSINGS) ×2
DERMABOND ADVANCED .7 DNX12 (GAUZE/BANDAGES/DRESSINGS) ×1 IMPLANT
DIFFUSER DRILL AIR PNEUMATIC (MISCELLANEOUS) ×3 IMPLANT
DRAPE HALF SHEET 40X57 (DRAPES) IMPLANT
DRAPE LAPAROTOMY 100X72 PEDS (DRAPES) ×3 IMPLANT
DRAPE MICROSCOPE LEICA (MISCELLANEOUS) ×3 IMPLANT
DRILL NEURO 2X3.1 SOFT TOUCH (MISCELLANEOUS) ×3
DRSG OPSITE POSTOP 4X6 (GAUZE/BANDAGES/DRESSINGS) ×3 IMPLANT
DURAPREP 6ML APPLICATOR 50/CS (WOUND CARE) ×3 IMPLANT
ELECT COATED BLADE 2.86 ST (ELECTRODE) ×3 IMPLANT
ELECT REM PT RETURN 9FT ADLT (ELECTROSURGICAL) ×3
ELECTRODE REM PT RTRN 9FT ADLT (ELECTROSURGICAL) ×1 IMPLANT
GAUZE 4X4 16PLY RFD (DISPOSABLE) IMPLANT
GAUZE SPONGE 4X4 12PLY STRL (GAUZE/BANDAGES/DRESSINGS) IMPLANT
GLOVE BIO SURGEON STRL SZ8 (GLOVE) ×3 IMPLANT
GLOVE BIOGEL PI IND STRL 8 (GLOVE) ×1 IMPLANT
GLOVE BIOGEL PI IND STRL 8.5 (GLOVE) ×1 IMPLANT
GLOVE BIOGEL PI INDICATOR 8 (GLOVE) ×2
GLOVE BIOGEL PI INDICATOR 8.5 (GLOVE) ×2
GLOVE ECLIPSE 8.0 STRL XLNG CF (GLOVE) ×3 IMPLANT
GLOVE EXAM NITRILE LRG STRL (GLOVE) IMPLANT
GLOVE EXAM NITRILE XL STR (GLOVE) IMPLANT
GLOVE EXAM NITRILE XS STR PU (GLOVE) IMPLANT
GOWN STRL REUS W/ TWL LRG LVL3 (GOWN DISPOSABLE) IMPLANT
GOWN STRL REUS W/ TWL XL LVL3 (GOWN DISPOSABLE) IMPLANT
GOWN STRL REUS W/TWL 2XL LVL3 (GOWN DISPOSABLE) IMPLANT
GOWN STRL REUS W/TWL LRG LVL3 (GOWN DISPOSABLE)
GOWN STRL REUS W/TWL XL LVL3 (GOWN DISPOSABLE)
HALTER HD/CHIN CERV TRACTION D (MISCELLANEOUS) ×3 IMPLANT
HEMOSTAT POWDER KIT SURGIFOAM (HEMOSTASIS) ×3 IMPLANT
KIT BASIN OR (CUSTOM PROCEDURE TRAY) ×3 IMPLANT
KIT TURNOVER KIT B (KITS) ×3 IMPLANT
NEEDLE HYPO 25X1 1.5 SAFETY (NEEDLE) ×3 IMPLANT
NEEDLE SPNL 22GX3.5 QUINCKE BK (NEEDLE) ×3 IMPLANT
NS IRRIG 1000ML POUR BTL (IV SOLUTION) ×3 IMPLANT
OIL CARTRIDGE MAESTRO DRILL (MISCELLANEOUS) ×3
PACK LAMINECTOMY NEURO (CUSTOM PROCEDURE TRAY) ×3 IMPLANT
PAD ARMBOARD 7.5X6 YLW CONV (MISCELLANEOUS) ×9 IMPLANT
PEEK SPACER AVS AS 6X14X16 4D (Cage) ×3 IMPLANT
PIN DISTRACTION 14MM (PIN) ×6 IMPLANT
PLATE AVIATOR ASSY 1LVL SZ 12 (Plate) ×3 IMPLANT
RUBBERBAND STERILE (MISCELLANEOUS) ×6 IMPLANT
SCREW AVIATOR VAR SELFTAP 4X12 (Screw) ×12 IMPLANT
SPONGE INTESTINAL PEANUT (DISPOSABLE) ×3 IMPLANT
SPONGE SURGIFOAM ABS GEL SZ50 (HEMOSTASIS) IMPLANT
STAPLER SKIN PROX WIDE 3.9 (STAPLE) IMPLANT
SUT VIC AB 3-0 SH 8-18 (SUTURE) ×6 IMPLANT
TOWEL GREEN STERILE (TOWEL DISPOSABLE) ×3 IMPLANT
TOWEL GREEN STERILE FF (TOWEL DISPOSABLE) ×3 IMPLANT
WATER STERILE IRR 1000ML POUR (IV SOLUTION) ×3 IMPLANT

## 2018-07-22 NOTE — Op Note (Signed)
07/22/2018  4:40 PM  PATIENT:  Joyce Payne  57 y.o. female  PRE-OPERATIVE DIAGNOSIS:  Herniated nucleus pulposus, Cervical C 67, spondylosis, cervicalgia, radiculopathy  POST-OPERATIVE DIAGNOSIS:  Herniated nucleus pulposus, Cervical C 67, spondylosis, cervicalgia, radiculopathy  PROCEDURE:  Procedure(s) with comments: Cervical 6-7 Anterior cervical decompression/discectomy/fusion (N/A) - Cervical 6-7 Anterior cervical decompression/discectomy/fusion with PEEK cage, autograft, plate  SURGEON:  Surgeon(s) and Role:    Erline Levine, MD - Primary  PHYSICIAN ASSISTANT: Christella Noa, MD  ASSISTANTS: Poteat, RN   ANESTHESIA:   general  EBL:  50 mL   BLOOD ADMINISTERED:none  DRAINS: none   LOCAL MEDICATIONS USED:  MARCAINE    and LIDOCAINE   SPECIMEN:  No Specimen  DISPOSITION OF SPECIMEN:  N/A  COUNTS:  YES  TOURNIQUET:  * No tourniquets in log *  DICTATION: Patient is 57 year old female with cervicalgia, herniated disc with stenosis, radiculopathy.  It was elected to take her to surgery for anterior cervical decompression and fusion C 67 level.  PROCEDURE: Patient was brought to operating room and following the smooth and uncomplicated induction of general endotracheal anesthesia her head was placed on a horseshoe head holder she was placed in 5 pounds of Holter traction and her anterior neck was prepped and draped in usual sterile fashion. An incision was made on the left side of midline after infiltrating the skin and subcutaneous tissues with local lidocaine. The platysmal layer was incised and subplatysmal dissection was performed exposing the anterior border sternocleidomastoid muscle. Using blunt dissection the carotid sheath was kept lateral and trachea and esophagus kept medial exposing the anterior cervical spine. A bent spinal needle was placed it was felt to be the C 45 level and this was confirmed on intraoperative x-ray. Longus coli muscles were taken down from the  anterior cervical spine using electrocautery and key elevator and self-retaining retractor was placed exposing the C 67 level. The interspace was incised and a thorough discectomy was performed. Distraction pins were placed. Uncinate spurs and central spondylitic ridges were drilled down with a high-speed drill. The spinal cord dura and both C 7 nerve roots were widely decompressed. Hemostasis was assured. After trial sizing a 6 mm lordotic PEEK cage was selected and packed with local autograft. This was tamped into position and countersunk appropriately. Distraction weight was removed. A 12 mm Avaiator anterior cervical plate was affixed to the cervical spine with 12 mm variable-angle screws 2 at C 6, 2 at C 7. All screws were well-positioned and locking mechanisms were engaged. A final X ray was obtained which did not show the graft or anterior plate, although there was no hardware from C 1 - C 5 levels. Soft tissues were inspected and found to be in good repair. The wound was irrigated. The platysma layer was closed with 3-0 Vicryl stitches and the skin was reapproximated with 3-0 Vicryl subcuticular stitches. The wound was dressed with Dermabond. Counts were correct at the end of the case. Patient was extubated and taken to recovery in stable and satisfactory condition.   PLAN OF CARE: Admit for overnight observation  PATIENT DISPOSITION:  PACU - hemodynamically stable.   Delay start of Pharmacological VTE agent (>24hrs) due to surgical blood loss or risk of bleeding: yes

## 2018-07-22 NOTE — Interval H&P Note (Signed)
History and Physical Interval Note:  07/22/2018 2:17 PM  Joyce Payne  has presented today for surgery, with the diagnosis of Herniated nucleus pulposus,, Cervical  The various methods of treatment have been discussed with the patient and family. After consideration of risks, benefits and other options for treatment, the patient has consented to  Procedure(s) with comments: Cervical 6-7 Anterior cervical decompression/discectomy/fusion (N/A) - Cervical 6-7 Anterior cervical decompression/discectomy/fusion as a surgical intervention .  The patient's history has been reviewed, patient examined, no change in status, stable for surgery.  I have reviewed the patient's chart and labs.  Questions were answered to the patient's satisfaction.     [name]

## 2018-07-22 NOTE — Progress Notes (Signed)
RT at bedside to teach pt on how to use IS.  Pt performed IS multiple times and is comfortable doing it on her own without further assistance.

## 2018-07-22 NOTE — Transfer of Care (Signed)
Immediate Anesthesia Transfer of Care Note  Patient: GAE BIHL  Procedure(s) Performed: Cervical 6-7 Anterior cervical decompression/discectomy/fusion (N/A )  Patient Location: PACU  Anesthesia Type:General  Level of Consciousness: awake, alert , oriented and patient cooperative  Airway & Oxygen Therapy: Patient Spontanous Breathing  Post-op Assessment: Report given to RN and Post -op Vital signs reviewed and stable  Post vital signs: Reviewed and stable  Last Vitals:  Vitals Value Taken Time  BP    Temp    Pulse 90 07/22/2018  4:33 PM  Resp 22 07/22/2018  4:33 PM  SpO2 97 % 07/22/2018  4:33 PM  Vitals shown include unvalidated device data.  Last Pain:  Vitals:   07/22/18 1345  TempSrc:   PainSc: 7       Patients Stated Pain Goal: 3 ([id number])  Complications: No apparent anesthesia complications

## 2018-07-22 NOTE — Anesthesia Procedure Notes (Signed)
Procedure Name: Intubation Date/Time: 07/22/2018 3:00 PM Performed by: Oletta Lamas, CRNA Pre-anesthesia Checklist: Patient identified, Emergency Drugs available, Suction available and Patient being monitored Patient Re-evaluated:Patient Re-evaluated prior to induction Oxygen Delivery Method: Circle System Utilized Preoxygenation: Pre-oxygenation with 100% oxygen Induction Type: IV induction Ventilation: Mask ventilation without difficulty Laryngoscope Size: Miller Grade View: Grade II Tube type: Oral Tube size: 7.0 mm Number of attempts: 1 Airway Equipment and Method: Stylet and Oral airway Placement Confirmation: ETT inserted through vocal cords under direct vision,  positive ETCO2 and breath sounds checked- equal and bilateral Secured at: 21 cm Tube secured with: Tape Dental Injury: Teeth and Oropharynx as per pre-operative assessment

## 2018-07-22 NOTE — Anesthesia Postprocedure Evaluation (Signed)
Anesthesia Post Note  Patient: Joyce Payne  Procedure(s) Performed: Cervical 6-7 Anterior cervical decompression/discectomy/fusion (N/A )     Patient location during evaluation: PACU Anesthesia Type: General Level of consciousness: awake and alert Pain management: pain level controlled Vital Signs Assessment: post-procedure vital signs reviewed and stable Respiratory status: spontaneous breathing, nonlabored ventilation and respiratory function stable Cardiovascular status: blood pressure returned to baseline and stable Postop Assessment: no apparent nausea or vomiting Anesthetic complications: no    Last Vitals:  Vitals:   07/22/18 1730 07/22/18 1733  BP: (!) 146/77 (!) 149/77  Pulse: 98 97  Resp: (!) 36 (!) 23  Temp:    SpO2: 96% 93%    Last Pain:  Vitals:   07/22/18 1720  TempSrc:   PainSc: 6                  Hava Massingale,W. EDMOND

## 2018-07-22 NOTE — Brief Op Note (Signed)
07/22/2018  4:40 PM  PATIENT:  Joyce Payne  57 y.o. female  PRE-OPERATIVE DIAGNOSIS:  Herniated nucleus pulposus, Cervical C 67, spondylosis, cervicalgia, radiculopathy  POST-OPERATIVE DIAGNOSIS:  Herniated nucleus pulposus, Cervical C 67, spondylosis, cervicalgia, radiculopathy  PROCEDURE:  Procedure(s) with comments: Cervical 6-7 Anterior cervical decompression/discectomy/fusion (N/A) - Cervical 6-7 Anterior cervical decompression/discectomy/fusion with PEEK cage, autograft, plate  SURGEON:  Surgeon(s) and Role:    Erline Levine, MD - Primary  PHYSICIAN ASSISTANT: Christella Noa, MD  ASSISTANTS: Poteat, RN   ANESTHESIA:   general  EBL:  50 mL   BLOOD ADMINISTERED:none  DRAINS: none   LOCAL MEDICATIONS USED:  MARCAINE    and LIDOCAINE   SPECIMEN:  No Specimen  DISPOSITION OF SPECIMEN:  N/A  COUNTS:  YES  TOURNIQUET:  * No tourniquets in log *  DICTATION: Patient is 57 year old female with cervicalgia, herniated disc with stenosis, radiculopathy.  It was elected to take her to surgery for anterior cervical decompression and fusion C 67 level.  PROCEDURE: Patient was brought to operating room and following the smooth and uncomplicated induction of general endotracheal anesthesia her head was placed on a horseshoe head holder she was placed in 5 pounds of Holter traction and her anterior neck was prepped and draped in usual sterile fashion. An incision was made on the left side of midline after infiltrating the skin and subcutaneous tissues with local lidocaine. The platysmal layer was incised and subplatysmal dissection was performed exposing the anterior border sternocleidomastoid muscle. Using blunt dissection the carotid sheath was kept lateral and trachea and esophagus kept medial exposing the anterior cervical spine. A bent spinal needle was placed it was felt to be the C 45 level and this was confirmed on intraoperative x-ray. Longus coli muscles were taken down from the  anterior cervical spine using electrocautery and key elevator and self-retaining retractor was placed exposing the C 67 level. The interspace was incised and a thorough discectomy was performed. Distraction pins were placed. Uncinate spurs and central spondylitic ridges were drilled down with a high-speed drill. The spinal cord dura and both C 7 nerve roots were widely decompressed. Hemostasis was assured. After trial sizing a 6 mm lordotic PEEK cage was selected and packed with local autograft. This was tamped into position and countersunk appropriately. Distraction weight was removed. A 12 mm Avaiator anterior cervical plate was affixed to the cervical spine with 12 mm variable-angle screws 2 at C 6, 2 at C 7. All screws were well-positioned and locking mechanisms were engaged. A final X ray was obtained which did not show the graft or anterior plate, although there was no hardware from C 1 - C 5 levels. Soft tissues were inspected and found to be in good repair. The wound was irrigated. The platysma layer was closed with 3-0 Vicryl stitches and the skin was reapproximated with 3-0 Vicryl subcuticular stitches. The wound was dressed with Dermabond. Counts were correct at the end of the case. Patient was extubated and taken to recovery in stable and satisfactory condition.   PLAN OF CARE: Admit for overnight observation  PATIENT DISPOSITION:  PACU - hemodynamically stable.   Delay start of Pharmacological VTE agent (>24hrs) due to surgical blood loss or risk of bleeding: yes

## 2018-07-22 NOTE — Progress Notes (Signed)
Awake, alert, conversant.  MAEW with good strength. Full bilateral B/T/HI.  Doing well.

## 2018-07-22 NOTE — Anesthesia Preprocedure Evaluation (Addendum)
Anesthesia Evaluation  Patient identified by MRN, date of birth, ID band Patient awake    Reviewed: Allergy & Precautions, NPO status , Patient's Chart, lab work & pertinent test results  History of Anesthesia Complications (+) PONV, POST - OP SPINAL HEADACHE and history of anesthetic complications  Airway Mallampati: III  TM Distance: >3 FB Neck ROM: Full    Dental no notable dental hx.    Pulmonary sleep apnea , former smoker, PE   Pulmonary exam normal breath sounds clear to auscultation       Cardiovascular + DVT  Normal cardiovascular exam Rhythm:Regular Rate:Normal  ECG: ST, rate 113. LAD  Exercise test Exercise testing shows a normal function capacity. The Ve/VCO2 slope is moderately elevated and may be related to elevated pulmonary pressures during exercise. There were no desaturations. Patient appears to be primarily limited by her obesity.   ECHO: Left ventricle: The cavity size was normal. Systolic function was normal. The estimated ejection fraction was in the range of 60% to 65%. Wall motion was normal; there were no regional wall motion abnormalities. Left ventricular diastolic function parameters were normal. Ventricular septum: The contour showed suggestion of mild diastolic flattening and no systolic flattening in the basal and mid septum and may suggest elevated right ventricular pressure. However the IVC was normal suggests normal central venous pressure. Atrial septum: No defect or patent foramen ovale was identified.  Cardiologist - Dr. Einar Gip   Neuro/Psych  Headaches, PSYCHIATRIC DISORDERS Anxiety Depression    GI/Hepatic Neg liver ROS, GERD  Medicated and Controlled,  Endo/Other  negative endocrine ROS  Renal/GU negative Renal ROS     Musculoskeletal  (+) Arthritis , Chronic back pain   Abdominal (+) + obese,   Peds  Hematology negative hematology ROS (+) Hodgkin's lymphoma        Anesthesia Other Findings Herniated nucleus pulposus, Cervical  Reproductive/Obstetrics                            Anesthesia Physical Anesthesia Plan  ASA: III  Anesthesia Plan: General   Post-op Pain Management:    Induction: Intravenous  PONV Risk Score and Plan: 4 or greater and Midazolam, Dexamethasone, Ondansetron, Scopolamine patch - Pre-op and Treatment may vary due to age or medical condition  Airway Management Planned: Oral ETT  Additional Equipment:   Intra-op Plan:   Post-operative Plan: Extubation in OR  Informed Consent: I have reviewed the patients History and Physical, chart, labs and discussed the procedure including the risks, benefits and alternatives for the proposed anesthesia with the patient or authorized representative who has indicated his/her understanding and acceptance.   Dental advisory given  Plan Discussed with: CRNA  Anesthesia Plan Comments:         Anesthesia Quick Evaluation

## 2018-07-23 DIAGNOSIS — M50123 Cervical disc disorder at C6-C7 level with radiculopathy: Secondary | ICD-10-CM | POA: Diagnosis not present

## 2018-07-23 MED ORDER — TIZANIDINE HCL 2 MG PO TABS: 2.0000 mg | ORAL_TABLET | Freq: Four times a day (QID) | ORAL | 0 refills | Status: AC | PRN

## 2018-07-23 MED ORDER — OXYCODONE HCL 5 MG PO TABS: 5.0000 mg | ORAL_TABLET | ORAL | 0 refills | Status: AC | PRN

## 2018-07-23 NOTE — Discharge Summary (Signed)
Physician Discharge Summary  Patient ID: Joyce Payne MRN: [id number] DOB/AGE: 57/22/1962 57 y.o.  Admit date: 07/22/2018 Discharge date: 07/23/2018  Admission Diagnoses:Herniated cervical disc C 67 with radiculopathy  Discharge Diagnoses: Herniated cervical disc C 67 with radiculopathy Active Problems:   Herniated cervical disc without myelopathy   Discharged Condition: good  Hospital Course: Patient underwent anterior cervical discectomy and fusion C 67 level and did well.  She was discharged home on AM of POD 1.  Consults: None  Significant Diagnostic Studies: None  Treatments: surgery: Patient underwent anterior cervical discectomy and fusion C 67 level   Discharge Exam: Blood pressure (!) 119/58, pulse 97, temperature 97.6 F (36.4 C), temperature source Oral, resp. rate 18, height 5\' 7"  (1.702 m), weight 105.5 kg, last menstrual period 02/22/2018, SpO2 96 %. Neurologic: Alert and oriented X 3, normal strength and tone. Normal symmetric reflexes. Normal coordination and gait Wound:CDI  Disposition: Home  Discharge Instructions    Diet - low sodium heart healthy   Complete by:  As directed    Increase activity slowly   Complete by:  As directed      Allergies as of 07/23/2018      Reactions   Chlorhexidine Gluconate Itching, Rash   Latex Rash   Povidone-iodine Rash   BETADINE   Tape Other (See Comments)   TEARS THE SKIN (SKIN IS THIN!!)      Medication List    STOP taking these medications   apixaban 5 MG Tabs tablet Commonly known as:  ELIQUIS     TAKE these medications   acetaminophen 650 MG CR tablet Commonly known as:  TYLENOL Take 1,300 mg by mouth 2 (two) times daily.   ARIPiprazole 5 MG tablet Commonly known as:  ABILIFY Take 5 mg by mouth daily.   cycloSPORINE 0.05 % ophthalmic emulsion Commonly known as:  RESTASIS Place 1 drop into both eyes 2 (two) times daily.   DULoxetine 60 MG capsule Commonly known as:  CYMBALTA Take 60 mg by  mouth daily.   linaclotide 145 MCG Caps capsule Commonly known as:  LINZESS Take 145 mcg by mouth daily before supper.   LORazepam 0.5 MG tablet Commonly known as:  ATIVAN Take 0.25 mg by mouth every 8 (eight) hours.   MAGNESIUM CITRATE PO Take 1 tablet by mouth 2 (two) times daily with breakfast and lunch.   multivitamin capsule Take 1 capsule by mouth daily.   Omeprazole-Sodium Bicarbonate 20-1100 MG Caps capsule Commonly known as:  ZEGERID Take 1 capsule by mouth daily as needed (acid reflux).   oxyCODONE 5 MG immediate release tablet Commonly known as:  Oxy IR/ROXICODONE Take 1-2 tablets (5-10 mg total) by mouth every 4 (four) hours as needed for moderate pain ((score 4 to 6)).   polyethylene glycol packet Commonly known as:  MIRALAX / GLYCOLAX Take by mouth at bedtime. 6-8 tblspoons   pregabalin 50 MG capsule Commonly known as:  LYRICA Take 100 mg by mouth daily as needed (nerve pain).   tiZANidine 2 MG tablet Commonly known as:  ZANAFLEX Take 1 tablet (2 mg total) by mouth every 8 (eight) hours as needed for muscle spasms. What changed:  Another medication with the same name was added. Make sure you understand how and when to take each.   tiZANidine 2 MG tablet Commonly known as:  ZANAFLEX Take 1 tablet (2 mg total) by mouth every 6 (six) hours as needed for muscle spasms. What changed:  You were already taking a medication  with the same name, and this prescription was added. Make sure you understand how and when to take each.   TOVIAZ 8 MG Tb24 tablet Generic drug:  fesoterodine Take 8 mg by mouth at bedtime.   traMADol 50 MG tablet Commonly known as:  ULTRAM Take 100 mg by mouth 3 (three) times daily as needed for moderate pain.   traZODone 150 MG tablet Commonly known as:  DESYREL Take 150 mg by mouth at bedtime.   vitamin C 1000 MG tablet Take 1,000 mg by mouth daily.   VITAMIN D PO Take 1 tablet by mouth daily.        Signed: [name],  MD 07/23/2018, 6:15 AM

## 2018-07-23 NOTE — Progress Notes (Signed)
Patient is discharged from room 3C08 at this time. Alert and in stable condition. IV site d/c'[d and instructions read to patient and spouse with understanding verbalized. Left unit via wheelchair with all belongings at side. 

## 2018-07-23 NOTE — Discharge Instructions (Signed)

## 2018-07-23 NOTE — Progress Notes (Signed)
Subjective: Patient reports doing well  Objective: Vital signs in last 24 hours: Temp:  [97.6 F (36.4 C)-98.7 F (37.1 C)] 97.6 F (36.4 C) (10/12 0440) Pulse Rate:  [88-103] 97 (10/12 0440) Resp:  [18-36] 18 (10/12 0440) BP: (119-151)/(58-111) 119/58 (10/12 0440) SpO2:  [91 %-99 %] 96 % (10/12 0440) Weight:  [105.5 kg] 105.5 kg (10/11 1345)  Intake/Output from previous day: 10/11 0701 - 10/12 0700 In: -  Out: 50 [Blood:50] Intake/Output this shift: No intake/output data recorded.  Physical Exam: Strength full both arms.  Dressing CDI.  Lab Results: No results for input(s): WBC, HGB, HCT, PLT in the last 72 hours. BMET No results for input(s): NA, K, CL, CO2, GLUCOSE, BUN, CREATININE, CALCIUM in the last 72 hours.  Studies/Results: Dg Cervical Spine 2-3 Views  Result Date: 07/22/2018 CLINICAL DATA:  ACDF C6-7. EXAM: CERVICAL SPINE - 2-3 VIEW COMPARISON:  None. FINDINGS: Two intraoperative views of the cervical spine are submitted. The first view demonstrates a needle localizing the C4-5 disc space. The cervical spine is obscured below the C5 level on the second view secondary to shoulder positioning. The patient is intubated. IMPRESSION: 1. Intraoperative localization of the C4-5 disc space. Electronically Signed   By: San Morelle M.D.   On: 07/22/2018 16:53    Assessment/Plan: Doing well.  Discharge home.    LOS: 1 day    [name], MD 07/23/2018, 6:11 AM

## 2018-07-25 ENCOUNTER — Encounter (HOSPITAL_COMMUNITY): Payer: Self-pay | Admitting: Neurosurgery

## 2018-07-27 ENCOUNTER — Encounter: Payer: Self-pay | Admitting: Emergency Medicine

## 2018-07-27 DIAGNOSIS — F332 Major depressive disorder, recurrent severe without psychotic features: Secondary | ICD-10-CM | POA: Insufficient documentation

## 2018-07-27 DIAGNOSIS — F1021 Alcohol dependence, in remission: Secondary | ICD-10-CM | POA: Insufficient documentation

## 2018-08-04 ENCOUNTER — Ambulatory Visit: Payer: Self-pay | Admitting: Psychiatry

## 2018-08-09 ENCOUNTER — Encounter: Payer: Self-pay | Admitting: Psychiatry

## 2018-08-09 ENCOUNTER — Ambulatory Visit (INDEPENDENT_AMBULATORY_CARE_PROVIDER_SITE_OTHER): Payer: BC Managed Care – PPO | Admitting: Psychiatry

## 2018-08-09 VITALS — BP 127/74 | HR 97

## 2018-08-09 DIAGNOSIS — F331 Major depressive disorder, recurrent, moderate: Secondary | ICD-10-CM | POA: Diagnosis not present

## 2018-08-09 DIAGNOSIS — F419 Anxiety disorder, unspecified: Secondary | ICD-10-CM

## 2018-08-09 DIAGNOSIS — F5101 Primary insomnia: Secondary | ICD-10-CM

## 2018-08-09 DIAGNOSIS — F1021 Alcohol dependence, in remission: Secondary | ICD-10-CM

## 2018-08-09 MED ORDER — LORAZEPAM 0.5 MG PO TABS: ORAL_TABLET | ORAL | 0 refills | Status: DC

## 2018-08-09 MED ORDER — BUSPIRONE HCL 15 MG PO TABS: ORAL_TABLET | ORAL | 1 refills | Status: DC

## 2018-08-09 MED ORDER — DULOXETINE HCL 60 MG PO CPEP: 60.0000 mg | ORAL_CAPSULE | Freq: Every day | ORAL | 0 refills | Status: DC

## 2018-08-09 MED ORDER — TRAZODONE HCL 150 MG PO TABS: 150.0000 mg | ORAL_TABLET | Freq: Every day | ORAL | 1 refills | Status: DC

## 2018-08-09 MED ORDER — ARIPIPRAZOLE 5 MG PO TABS: 5.0000 mg | ORAL_TABLET | Freq: Every day | ORAL | 0 refills | Status: DC

## 2018-08-09 NOTE — Progress Notes (Signed)
Joyce Payne [id number] 1961-08-03 57 y.o.  Subjective:   Patient ID:  Joyce Payne is a 57 y.o. (DOB 10/03/1961) female.  Chief Complaint:  Chief Complaint  Patient presents with  . Anxiety  . Depression  . Sleeping Problem    HPI Joyce Payne presents to the office today for follow-up of depression, insomnia, and anxiety. Reports that her surgery went well and today is her first outing. Sees surgeon next week for f/u. Has noticed that she has been dropping objects less often since having surgery. She reports that she has been having difficulty getting comfortable and was awakening every 3 hours and the last few nights has been able to sleep for 5 hours consecutively. Reports that she had gaps in medications after surgery due to surgeon and PCP being out of town and reports that she had withdrawal s/s. She reports that she was using lorazepam as needed to help with withdrawal and prevent seizures. She reports that she is now back on Tramadol and hopes to be able to gradually titrate off of Tramadol in the future. She reports that she felt that Ativan dose was not adequately controlling anxiety prior to surgery and reports that she was anxious about her surgery. She reports that Ativan was "taking the edge off" anxiety but continued to have anxiety throughout the day. She reports that she has been taking about 4.5-5 Ativan 0.5 mg tabs daily over the last couple of weeks when having acute withdrawal from pain medications. She reports that she has been taking about four Ativan 0.5 mg tabs daily since re-starting Tramadol. Reports that she has #12 Ativan 0.5 mg remaining from script filled 08/02/18. She anticipates that usage will continue to decrease and that anxiety is improving. She reports that she continues to experience some chest tightness and increased heart rate. Has had some headaches with increased anxiety. She reports that she thinks acute withdrawal from pain medications has resolved.  Reports that she had some increased anxiety with surgery thinking about other recent past surgeries, possible recurrence of cancer, and loss of friend. "The anxiety is definitely there" but not as severe as she would have expected.  She reports that Abilify helped improve mood s/s within about a week. "I could feel that heaviness lifted" and that she has "felt happier." She reports some residual mild depression and attributes this to unresolved psychosocial issues and plans to address this with therapist tomorrow. Energy has been low and continues to recover from recent surgery. She reports that motivation has been low, but is recognizing what needs to be done and has been able to do it. Has noticed some increased interest in things and would like to take guitar lessons. Appetite is ok but was significantly decreased when opiate withdrawal was acute. Reports that she has been hungrier recently. She reports adequate concentration. Denies SI.   Reports that she and her wife are contemplating moving.   Past psychiatric medication trials: Cymbalta- reports minimally effective for mood, anxiety, or pain Paxil- effective Zoloft-took up to 400 mg daily with minimal response Pristiq-helpful for mood and anxiety Effexor XR- effective for anxiety.  Caused weight gain.  Seemed to lose efficacy over time Abilify-effective for depression Lorazepam-started when diagnosed with Hodgkin's lymphoma Diazepam Librium-not effective.worsened constipation Lyrica prescribed for pain Vyvanse-took for 5 to 6 years and has not noticed a difference since stopping it Trazodone Ambien-helpful for insomnia.  Calls to parasomnias.   Review of Systems:  Review of Systems  Musculoskeletal: Positive  for back pain and neck pain.       Recovering from surgery  Neurological: Positive for numbness and headaches.    Medications: I have reviewed the patient's current medications.  Current Outpatient Medications  Medication  Sig Dispense Refill  . acetaminophen (TYLENOL) 650 MG CR tablet Take 1,300 mg by mouth 2 (two) times daily.    . ARIPiprazole (ABILIFY) 5 MG tablet Take 1 tablet (5 mg total) by mouth daily. 90 tablet 0  . Ascorbic Acid (VITAMIN C) 1000 MG tablet Take 1,000 mg by mouth daily.    . celecoxib (CELEBREX) 100 MG capsule Take 100 mg by mouth daily.    . Cholecalciferol (VITAMIN D Payne) Take 1 tablet by mouth daily.    . cycloSPORINE (RESTASIS) 0.05 % ophthalmic emulsion Place 1 drop into both eyes 2 (two) times daily.    . DULoxetine (CYMBALTA) 60 MG capsule Take 1 capsule (60 mg total) by mouth daily. 90 capsule 0  . ELIQUIS 5 MG TABS tablet 5 mg 2 (two) times daily.     . fesoterodine (TOVIAZ) 8 MG TB24 tablet Take 8 mg by mouth at bedtime.     Marland Kitchen linaclotide (LINZESS) 145 MCG CAPS capsule Take 145 mcg by mouth daily before supper.    . Multiple Vitamin (MULTIVITAMIN) capsule Take 1 capsule by mouth daily.    [name] Bicarbonate (ZEGERID) 20-1100 MG CAPS capsule Take 1 capsule by mouth daily as needed (acid reflux).     . pregabalin (LYRICA) 50 MG capsule Take 50 mg by mouth 2 (two) times daily as needed (nerve pain).     . traMADol (ULTRAM) 50 MG tablet Take 100 mg by mouth 3 (three) times daily as needed for moderate pain.   1  . traZODone (DESYREL) 150 MG tablet Take 1 tablet (150 mg total) by mouth at bedtime. 90 tablet 1  . busPIRone (BUSPAR) 15 MG tablet Take 1/3 tablet p.o. twice daily for 1 week, then take 2/3 tablet p.o. twice daily for 1 week, then take 1 tablet p.o. twice daily 60 tablet 1  . [START ON 08/12/2018] LORazepam (ATIVAN) 0.5 MG tablet Take 1 tab Payne four times daily prn anxiety x 1 week, then take 1 tab Payne TID prn x 1 week, then take 1/2-1 Payne TID prn anxiety 80 tablet 0  . [START ON 09/09/2018] LORazepam (ATIVAN) 0.5 MG tablet Take 1/2-1 tab Payne TID prn anixety 60 tablet 0  . MAGNESIUM CITRATE Payne Take 1 tablet by mouth 2 (two) times daily with breakfast and lunch.    .  oxyCODONE (OXY IR/ROXICODONE) 5 MG immediate release tablet Take 1-2 tablets (5-10 mg total) by mouth every 4 (four) hours as needed for moderate pain ((score 4 to 6)). (Patient not taking: Reported on 08/09/2018) 60 tablet 0  . polyethylene glycol (MIRALAX / GLYCOLAX) packet Take by mouth at bedtime. 6-8 tblspoons    . tiZANidine (ZANAFLEX) 2 MG tablet Take 1 tablet (2 mg total) by mouth every 8 (eight) hours as needed for muscle spasms. (Patient not taking: Reported on 08/09/2018) 90 tablet 5  . tiZANidine (ZANAFLEX) 2 MG tablet Take 1 tablet (2 mg total) by mouth every 6 (six) hours as needed for muscle spasms. (Patient not taking: Reported on 08/09/2018) 60 tablet 0   No current facility-administered medications for this visit.     Medication Side Effects: Other: Possible increased appetite/hunger  Allergies:  Allergies  Allergen Reactions  . Chlorhexidine Gluconate Itching and Rash  . Latex  Rash  . Povidone-Iodine Rash    BETADINE  . Tape Other (See Comments)    TEARS THE SKIN (SKIN IS THIN!!)    Past Medical History:  Diagnosis Date  . Chronic back pain   . Depression with anxiety   . GERD (gastroesophageal reflux disease)   . History of DVT (deep vein thrombosis)    NOV 2011  RLE , completed coumadin therapy:  Also, developed acutre RLE DVT  . History of pulmonary embolism    NOV 2011 during chemothearapy bilateral PEs, COUMDADIN THERAPY ENDED JUNE 2012: Also develeped bilateral acute saddle PEs with right heart strain s/p  thromblysis  . 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 Neuromuscular disorder ([place])   . OA (osteoarthritis)    KNEES , BACK  . OSA (obstructive sleep apnea)    02-21-2018 PER PT NO CPAP  . PONV (postoperative nausea and vomiting)   . Recurrent binge eating 02/16/2012   Reason for topomax  . Rib fracture     6 rib fractures  . S/P gastric bypass 03/1998  . Spinal headache   . SUI (stress urinary incontinence, female)    CONTROLLED W/ [place]    Family History  Problem Relation Age of Onset  . Hypertension Mother   . Hyperlipidemia Mother   . Depression Mother   . Cancer Father 21       colon can  . Depression Brother   . Hypertension Other   . Cancer Other        colon, 1st degree relative<60  . Alcohol abuse Paternal Grandfather     Social History   Socioeconomic History  . Marital status: Married    Spouse name: Not on file  . Number of children: 0  . Years of education: Not on file  . Highest education level: Not on file  Occupational History  . Occupation: Agricultural engineer: [employer]  . Financial resource strain: Not on file  . Food insecurity:    Worry: Not on file    Inability: Not on file  . Transportation needs:    Medical: Not on file    Non-medical: Not on file  Tobacco Use  . Smoking status: Former Smoker    Packs/day: 1.50    Years: 17.00    Pack years: 25.50    Types: Cigarettes    Last attempt to quit: 10/22/2006    Years since quitting: 11.8  . Smokeless tobacco: Former Network engineer and Sexual Activity  . Alcohol use: No    Comment: 02-21-2018 stop alcohol 24 yrs ago (1995 approx.)  . Drug use: Not Currently  . Sexual activity: Yes    Birth control/protection: Post-menopausal  Lifestyle  . Physical activity:    Days per week: Not on file    Minutes per session: Not on file  . Stress: Not on file  Relationships  . Social connections:    Talks on phone: Not on file    Gets together: Not on file    Attends religious service: Not on file    Active member of club or organization: Not on file    Attends meetings of clubs or organizations: Not on file    Relationship status: Not on file  . Intimate partner violence:    Fear of current or  ex partner: Not on file    Emotionally abused: Not on file     Physically abused: Not on file    Forced sexual activity: Not on file  Other Topics Concern  . Not on file  Social History Narrative   Regular exercise- NO    Past Medical History, Surgical history, Social history, and Family history were reviewed and updated as appropriate.   Please see review of systems for further details on the patient's review from today.   Objective:   Physical Exam:  BP 127/74   Pulse 97   LMP 02/22/2018   Physical Exam  Constitutional: She is oriented to person, place, and time. She appears well-developed. No distress.  Musculoskeletal: She exhibits no deformity.  Wearing neck collar.  Limited range of motion in neck  Neurological: She is alert and oriented to person, place, and time. Coordination normal.  Psychiatric: Her speech is normal and behavior is normal. Judgment and thought content normal. Her affect is not angry, not blunt, not labile and not inappropriate. Cognition and memory are normal. She does not exhibit a depressed mood. She expresses no homicidal and no suicidal ideation. She expresses no suicidal plans and no homicidal plans.  Presents as mildly anxious Insight intact. No auditory or visual hallucinations. No delusions.     Lab Review:     Component Value Date/Time   NA 138 07/15/2018 1613   NA 145 01/11/2017 1114   K 4.1 07/15/2018 1613   K 4.7 01/11/2017 1114   CL 103 07/15/2018 1613   CL 108 (H) 02/06/2013 1022   CO2 27 07/15/2018 1613   CO2 28 01/11/2017 1114   GLUCOSE 99 07/15/2018 1613   GLUCOSE 94 01/11/2017 1114   GLUCOSE 108 (H) 02/06/2013 1022   BUN 18 07/15/2018 1613   BUN 14.4 01/11/2017 1114   CREATININE 0.84 07/15/2018 1613   CREATININE 0.8 01/11/2017 1114   CALCIUM 9.3 07/15/2018 1613   CALCIUM 9.6 01/11/2017 1114   PROT 6.3 (L) 02/21/2018 1516   PROT 6.3 (L) 05/23/2015 1432   ALBUMIN 3.7 02/21/2018 1516   ALBUMIN 3.8 05/23/2015 1432   AST 26 02/21/2018 1516   AST 15 05/23/2015 1432   ALT 27 02/21/2018  1516   ALT 20 05/23/2015 1432   ALKPHOS 97 02/21/2018 1516   ALKPHOS 90 05/23/2015 1432   BILITOT 0.3 02/21/2018 1516   BILITOT 0.30 05/23/2015 1432   GFRNONAA >60 07/15/2018 1613   GFRAA >60 07/15/2018 1613       Component Value Date/Time   WBC 6.2 07/15/2018 1613   RBC 4.20 07/15/2018 1613   HGB 13.1 07/15/2018 1613   HGB 14.0 01/11/2017 1114   HCT 39.8 07/15/2018 1613   HCT 41.5 01/11/2017 1114   PLT 250 07/15/2018 1613   PLT 241 01/11/2017 1114   MCV 94.8 07/15/2018 1613   MCV 93.2 01/11/2017 1114   MCH 31.2 07/15/2018 1613   MCHC 32.9 07/15/2018 1613   RDW 12.2 07/15/2018 1613   RDW 13.5 01/11/2017 1114   LYMPHSABS 2.3 02/11/2018 1450   LYMPHSABS 1.6 01/11/2017 1114   MONOABS 0.4 02/11/2018 1450   MONOABS 0.5 01/11/2017 1114   EOSABS 0.1 02/11/2018 1450   EOSABS 0.0 01/11/2017 1114   BASOSABS 0.0 02/11/2018 1450   BASOSABS 0.0 01/11/2017 1114    No results found for: POCLITH, LITHIUM   No results found for: PHENYTOIN, PHENOBARB, VALPROATE, CBMZ   .res Assessment: Plan:   Patient seen for 45 minutes and greater  than 50% of visit spent counseling patient regarding treatment options.  Discussed recently using Ativan more when having acute pain and opiate withdrawal.  Discussed plan to gradually taper Ativan down to previous dose and agreed to transition to Ativan 0.5 mg up to 4 times daily as needed for 1 week, then Ativan 0.5 mg up to 3 tabs daily for 1 week, and then Ativan 0.5 mg 1/2-1 tab p.o. 3 times daily as needed up to 2 tabs daily for anxiety.  Also discussed potential benefits, risks, and side effects of BuSpar for anxiety and discussed that this may help control anxiety signs and symptoms and decreased need for benzodiazepines.  Will start BuSpar 50 mg 1/3 tablet twice daily for 1 week, then 2/3 tablet twice daily x1 week then 1 tablet twice daily for anxiety.  Discussed potential metabolic side effects of Abilify and patient reports that she may be experiencing  some increased appetite and feels that benefits outweigh risk at this time.  Encourage patient to notify provider if appetite and weight gain become problematic.  Will continue Abilify 5 mg daily for depression.  Will continue Cymbalta 60 mg daily for mood and anxiety.  Discussed considering changing Cymbalta in the future if no significant improvement with other medication changes.  Will continue trazodone 150 mg at bedtime for insomnia. 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  Future Appointments  Date Time Provider [place]  09/14/2018 11:00 AM Thayer Headings, PMHNP [facility] None    No orders of the defined types were placed in this encounter.     -------------------------------

## 2018-09-02 ENCOUNTER — Other Ambulatory Visit: Payer: Self-pay

## 2018-09-02 DIAGNOSIS — F331 Major depressive disorder, recurrent, moderate: Secondary | ICD-10-CM

## 2018-09-06 ENCOUNTER — Other Ambulatory Visit: Payer: Self-pay

## 2018-09-06 DIAGNOSIS — F419 Anxiety disorder, unspecified: Secondary | ICD-10-CM

## 2018-09-14 ENCOUNTER — Encounter: Payer: Self-pay | Admitting: Psychiatry

## 2018-09-14 ENCOUNTER — Ambulatory Visit (INDEPENDENT_AMBULATORY_CARE_PROVIDER_SITE_OTHER): Payer: BC Managed Care – PPO | Admitting: Psychiatry

## 2018-09-14 VITALS — Ht 66.0 in | Wt 249.0 lb

## 2018-09-14 DIAGNOSIS — F419 Anxiety disorder, unspecified: Secondary | ICD-10-CM | POA: Diagnosis not present

## 2018-09-14 DIAGNOSIS — F331 Major depressive disorder, recurrent, moderate: Secondary | ICD-10-CM

## 2018-09-14 DIAGNOSIS — F5101 Primary insomnia: Secondary | ICD-10-CM

## 2018-09-14 MED ORDER — BUPROPION HCL ER (XL) 150 MG PO TB24: 150.0000 mg | ORAL_TABLET | Freq: Every day | ORAL | 1 refills | Status: DC

## 2018-09-14 MED ORDER — ARIPIPRAZOLE 2 MG PO TABS: 2.0000 mg | ORAL_TABLET | Freq: Every day | ORAL | 0 refills | Status: DC

## 2018-09-14 MED ORDER — HYDROXYZINE PAMOATE 25 MG PO CAPS: ORAL_CAPSULE | ORAL | 2 refills | Status: DC

## 2018-09-14 MED ORDER — DULOXETINE HCL 60 MG PO CPEP: 60.0000 mg | ORAL_CAPSULE | Freq: Every day | ORAL | 0 refills | Status: DC

## 2018-09-14 NOTE — Progress Notes (Signed)
Joyce Payne [id number] [birth year] 57 y.o.  Subjective:   Patient ID:  Joyce Payne is a 57 y.o. (DOB [birth year]) female.  Chief Complaint:  Chief Complaint  Patient presents with  . Depression  . Insomnia  . Other    Increased appetite/weight gain    HPI Joyce Payne presents to the office today for follow-up of anxiety, depression, and insomnia. She reports that she continues to have difficulty with insomnia. She reports that she was able to sleep well on vacation. She reports that she continues to awaken about every 2 hours throughout the night. Denies difficulty falling asleep. Reports that she has had excessive hunger since starting Abilify. Reports that hunger has decreased with reducing Abilify to 2.5 mg po qd. Has gained 25 lbs since 07/05/18. Reports that depression was well controlled with Abilify 5 mg po qd and that she has had some mild depression with Abilify 2.5 mg po qd. Reports that Buspar was helpful for anxiety in some ways, but caused her to feel edgy and restless and that this resolved with stopping Buspar about 2 weeks ago. She reports that her anxiety has been recently well controlled with Ativan. Reports energy and motivation has not been as good on Abilify 2.5 mg compared to 5 mg. Notices low motivation towards things she knows she needs to do and wants to do. Concentration has also not been as good with lower dose of Abilify. Denies SI.    Reports that she has been tapering her dose of Lyrica.   Past psychiatric medication trials: Cymbalta- reports minimally effective for mood, anxiety, or pain Paxil- effective Zoloft-took up to 400 mg daily with minimal response Pristiq-helpful for mood and anxiety Effexor XR- effective for anxiety.  Caused weight gain.  Seemed to lose efficacy over time Abilify-effective for depression Lorazepam-started when diagnosed with Hodgkin's lymphoma Diazepam Librium-not effective.worsened constipation Lyrica prescribed for  pain Topamax- mild decrease in appetite. Vyvanse-took for 5 to 6 years and has not noticed a difference since stopping it Trazodone- has taken long term and has taken up to 300 mg po QHS Ambien-helpful for insomnia.  Caused parasomnia.    Review of Systems:  Review of Systems  HENT: Positive for sinus pain.   Gastrointestinal: Positive for constipation.  Musculoskeletal: Positive for neck pain. Negative for gait problem.       Possible worsening TMJ. Sees dentist tomorrow and is going to inquire about a mouth guard.  Neurological: Negative for tremors.  Psychiatric/Behavioral:       Please refer to HPI    Medications: I have reviewed the patient's current medications.  Current Outpatient Medications  Medication Sig Dispense Refill  . acetaminophen (TYLENOL) 650 MG CR tablet Take 1,300 mg by mouth 2 (two) times daily.    . ARIPiprazole (ABILIFY) 2 MG tablet Take 1 tablet (2 mg total) by mouth daily. 30 tablet 0  . Ascorbic Acid (VITAMIN C) 1000 MG tablet Take 1,000 mg by mouth daily.    . celecoxib (CELEBREX) 100 MG capsule Take 100 mg by mouth daily.    . Cholecalciferol (VITAMIN D PO) Take 1 tablet by mouth daily.    . cycloSPORINE (RESTASIS) 0.05 % ophthalmic emulsion Place 1 drop into both eyes 2 (two) times daily.    . DULoxetine (CYMBALTA) 60 MG capsule Take 1 capsule (60 mg total) by mouth daily. 90 capsule 0  . ELIQUIS 5 MG TABS tablet 5 mg 2 (two) times daily.     . fesoterodine (TOVIAZ)  8 MG TB24 tablet Take 8 mg by mouth at bedtime.     Marland Kitchen linaclotide (LINZESS) 290 MCG CAPS capsule Take 290 mcg by mouth daily before supper.     Marland Kitchen LORazepam (ATIVAN) 0.5 MG tablet Take 1/2-1 tab po TID prn anixety (Patient taking differently: Take 0.5 mg by mouth 4 (four) times daily as needed. Take 1/2-1 tab po TID prn anixety) 60 tablet 0  . MAGNESIUM CITRATE PO Take 1 tablet by mouth 2 (two) times daily with breakfast and lunch.    . Multiple Vitamin (MULTIVITAMIN) capsule Take 1 capsule  by mouth daily.    [name] Bicarbonate (ZEGERID) 20-1100 MG CAPS capsule Take 1 capsule by mouth daily as needed (acid reflux).     . pregabalin (LYRICA) 50 MG capsule Take 50 mg by mouth 2 (two) times daily as needed (nerve pain).     . traZODone (DESYREL) 150 MG tablet Take 1 tablet (150 mg total) by mouth at bedtime. 90 tablet 1  . buPROPion (WELLBUTRIN XL) 150 MG 24 hr tablet Take 1 tablet (150 mg total) by mouth daily. 30 tablet 1  . hydrOXYzine (VISTARIL) 25 MG capsule Take 1-2 caps po QHS prn insomnia. 60 capsule 2  . oxyCODONE (OXY IR/ROXICODONE) 5 MG immediate release tablet Take 1-2 tablets (5-10 mg total) by mouth every 4 (four) hours as needed for moderate pain ((score 4 to 6)). (Patient not taking: Reported on 08/09/2018) 60 tablet 0  . polyethylene glycol (MIRALAX / GLYCOLAX) packet Take by mouth at bedtime. 6-8 tblspoons    . tiZANidine (ZANAFLEX) 2 MG tablet Take 1 tablet (2 mg total) by mouth every 8 (eight) hours as needed for muscle spasms. (Patient not taking: Reported on 08/09/2018) 90 tablet 5  . tiZANidine (ZANAFLEX) 2 MG tablet Take 1 tablet (2 mg total) by mouth every 6 (six) hours as needed for muscle spasms. (Patient not taking: Reported on 08/09/2018) 60 tablet 0  . traMADol (ULTRAM) 50 MG tablet Take 100 mg by mouth 3 (three) times daily as needed for moderate pain.   1   No current facility-administered medications for this visit.     Medication Side Effects: Other: Increased appetite, weight gain, constipation  Allergies:  Allergies  Allergen Reactions  . Chlorhexidine Gluconate Itching and Rash  . Latex Rash  . Povidone-Iodine Rash    BETADINE  . Tape Other (See Comments)    TEARS THE SKIN (SKIN IS THIN!!)    Past Medical History:  Diagnosis Date  . Chronic back pain   . Depression with anxiety   . Diverticulitis   . GERD (gastroesophageal reflux disease)   . History of DVT (deep vein thrombosis)    NOV 2011  RLE , completed coumadin  therapy:  Also, developed acutre RLE DVT  . History of pulmonary embolism    NOV 2011 during chemothearapy bilateral PEs, COUMDADIN THERAPY ENDED JUNE 2012: Also develeped bilateral acute saddle PEs with right heart strain s/p  thromblysis  . 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 Neuromuscular disorder ([place])   . OA (osteoarthritis)    KNEES , BACK  . OSA (obstructive sleep apnea)    02-21-2018 PER PT NO CPAP  . PONV (postoperative nausea and vomiting)   . Recurrent binge eating 02/16/2012   Reason for topomax  . Rib fracture  6 rib fractures  . S/P gastric bypass 03/1998  . Spinal headache   . SUI (stress urinary incontinence, female)    CONTROLLED W/ [place]    Family History  Problem Relation Age of Onset  . Hypertension Mother   . Hyperlipidemia Mother   . Depression Mother   . Cancer Father 35       colon can  . Depression Brother   . Hypertension Other   . Cancer Other        colon, 1st degree relative<60  . Alcohol abuse Paternal Grandfather     Social History   Socioeconomic History  . Marital status: Married    Spouse name: Not on file  . Number of children: 0  . Years of education: Not on file  . Highest education level: Not on file  Occupational History  . Occupation: Agricultural engineer: [employer]  . Financial resource strain: Not on file  . Food insecurity:    Worry: Not on file    Inability: Not on file  . Transportation needs:    Medical: Not on file    Non-medical: Not on file  Tobacco Use  . Smoking status: Former Smoker    Packs/day: 1.50    Years: 17.00    Pack years: 25.50    Types: Cigarettes    Last attempt to quit: 10/22/2006    Years since quitting: 11.9  . Smokeless tobacco: Former Network engineer and Sexual Activity  . Alcohol use: No    Comment:  02-21-2018 stop alcohol 24 yrs ago (1995 approx.)  . Drug use: Not Currently  . Sexual activity: Yes    Birth control/protection: Post-menopausal  Lifestyle  . Physical activity:    Days per week: Not on file    Minutes per session: Not on file  . Stress: Not on file  Relationships  . Social connections:    Talks on phone: Not on file    Gets together: Not on file    Attends religious service: Not on file    Active member of club or organization: Not on file    Attends meetings of clubs or organizations: Not on file    Relationship status: Not on file  . Intimate partner violence:    Fear of current or ex partner: Not on file    Emotionally abused: Not on file    Physically abused: Not on file    Forced sexual activity: Not on file  Other Topics Concern  . Not on file  Social History Narrative   Regular exercise- NO    Past Medical History, Surgical history, Social history, and Family history were reviewed and updated as appropriate.   Please see review of systems for further details on the patient's review from today.   Objective:   Physical Exam:  Ht 5\' 6"  (1.676 m)   Wt 249 lb (112.9 kg)   LMP 02/22/2018   BMI 40.19 kg/m   Physical Exam  Constitutional: She is oriented to person, place, and time. She appears well-developed. No distress.  Musculoskeletal: She exhibits no deformity.  Neurological: She is alert and oriented to person, place, and time. Coordination normal.  Psychiatric: Her speech is normal and behavior is normal. Judgment and thought content normal. Her mood appears not anxious. Her affect is not angry, not blunt, not labile and not inappropriate. Cognition and memory are normal. She exhibits a depressed mood. She expresses no homicidal and no suicidal  ideation. She expresses no suicidal plans and no homicidal plans.  Insight intact. No auditory or visual hallucinations. No delusions.     Lab Review:     Component Value Date/Time   NA 138  07/15/2018 1613   NA 145 01/11/2017 1114   K 4.1 07/15/2018 1613   K 4.7 01/11/2017 1114   CL 103 07/15/2018 1613   CL 108 (H) 02/06/2013 1022   CO2 27 07/15/2018 1613   CO2 28 01/11/2017 1114   GLUCOSE 99 07/15/2018 1613   GLUCOSE 94 01/11/2017 1114   GLUCOSE 108 (H) 02/06/2013 1022   BUN 18 07/15/2018 1613   BUN 14.4 01/11/2017 1114   CREATININE 0.84 07/15/2018 1613   CREATININE 0.8 01/11/2017 1114   CALCIUM 9.3 07/15/2018 1613   CALCIUM 9.6 01/11/2017 1114   PROT 6.3 (L) 02/21/2018 1516   PROT 6.3 (L) 05/23/2015 1432   ALBUMIN 3.7 02/21/2018 1516   ALBUMIN 3.8 05/23/2015 1432   AST 26 02/21/2018 1516   AST 15 05/23/2015 1432   ALT 27 02/21/2018 1516   ALT 20 05/23/2015 1432   ALKPHOS 97 02/21/2018 1516   ALKPHOS 90 05/23/2015 1432   BILITOT 0.3 02/21/2018 1516   BILITOT 0.30 05/23/2015 1432   GFRNONAA >60 07/15/2018 1613   GFRAA >60 07/15/2018 1613       Component Value Date/Time   WBC 6.2 07/15/2018 1613   RBC 4.20 07/15/2018 1613   HGB 13.1 07/15/2018 1613   HGB 14.0 01/11/2017 1114   HCT 39.8 07/15/2018 1613   HCT 41.5 01/11/2017 1114   PLT 250 07/15/2018 1613   PLT 241 01/11/2017 1114   MCV 94.8 07/15/2018 1613   MCV 93.2 01/11/2017 1114   MCH 31.2 07/15/2018 1613   MCHC 32.9 07/15/2018 1613   RDW 12.2 07/15/2018 1613   RDW 13.5 01/11/2017 1114   LYMPHSABS 2.3 02/11/2018 1450   LYMPHSABS 1.6 01/11/2017 1114   MONOABS 0.4 02/11/2018 1450   MONOABS 0.5 01/11/2017 1114   EOSABS 0.1 02/11/2018 1450   EOSABS 0.0 01/11/2017 1114   BASOSABS 0.0 02/11/2018 1450   BASOSABS 0.0 01/11/2017 1114    No results found for: POCLITH, LITHIUM   No results found for: PHENYTOIN, PHENOBARB, VALPROATE, CBMZ   .res Assessment: Plan:   Patient seen for 30 minutes and greater than 50% of visit spent counseling patient regarding recent increased appetite and weight gain and discussed that this could be an adverse effect due to Abilify.  Discussed treatment options may  include adding a medication to help reduce cravings, such as Topamax or metformin; switching Abilify to another atypical antipsychotic such as Rexulti which may or may not improve appetite and weight; or augmentation with Wellbutrin XL since this may be helpful for depressive signs and symptoms as well as suppressing appetite.  Discussed potential benefits, risks, and side effects of Wellbutrin XL and patient reports that she would prefer to start Wellbutrin XL at this time.  Discussed that she may wish to consider continuing low-dose Abilify for a few weeks until Wellbutrin XL becomes effective since she has had a partial benefit in depressive signs and symptoms with Abilify 2 mg daily.  Also discussed potential benefits, risks, and side effects of hydroxyzine for insomnia and recommended alternating between hydroxyzine and trazodone if she notices decreased response after taking for several consecutive days. Start Wellbutrin XL 150 mg p.o. every morning for depression and to help with increased appetite and weight gain. Anxiety disorder, unspecified type - Plan: hydrOXYzine (  VISTARIL) 25 MG capsule  Moderate episode of recurrent major depressive disorder (HCC) - Plan: buPROPion (WELLBUTRIN XL) 150 MG 24 hr tablet, DULoxetine (CYMBALTA) 60 MG capsule, ARIPiprazole (ABILIFY) 2 MG tablet  Primary insomnia - Plan: hydrOXYzine (VISTARIL) 25 MG capsule  Please see After Visit Summary for patient specific instructions.  Future Appointments  Date Time Provider [place]  10/14/2018  2:00 PM Thayer Headings, PMHNP [facility] None    No orders of the defined types were placed in this encounter.     -------------------------------

## 2018-09-30 ENCOUNTER — Other Ambulatory Visit: Payer: Self-pay | Admitting: Physician Assistant

## 2018-09-30 DIAGNOSIS — Z800 Family history of malignant neoplasm of digestive organs: Secondary | ICD-10-CM

## 2018-09-30 DIAGNOSIS — R1084 Generalized abdominal pain: Secondary | ICD-10-CM

## 2018-09-30 DIAGNOSIS — R1030 Lower abdominal pain, unspecified: Secondary | ICD-10-CM

## 2018-09-30 DIAGNOSIS — Z8543 Personal history of malignant neoplasm of ovary: Secondary | ICD-10-CM

## 2018-10-03 ENCOUNTER — Other Ambulatory Visit: Payer: Self-pay

## 2018-10-03 DIAGNOSIS — F419 Anxiety disorder, unspecified: Secondary | ICD-10-CM

## 2018-10-03 MED ORDER — LORAZEPAM 0.5 MG PO TABS: ORAL_TABLET | ORAL | 0 refills | Status: DC

## 2018-10-03 NOTE — Telephone Encounter (Signed)
Pt left message need refill on Lorazepam 0.5mg  at [facility] Next appt 10/14/18

## 2018-10-11 ENCOUNTER — Ambulatory Visit
Admission: RE | Admit: 2018-10-11 | Discharge: 2018-10-11 | Disposition: A | Discharge status: Home / Self Care | Payer: BC Managed Care – PPO | Source: Ambulatory Visit | Attending: Physician Assistant | Admitting: Physician Assistant

## 2018-10-11 DIAGNOSIS — Z800 Family history of malignant neoplasm of digestive organs: Secondary | ICD-10-CM

## 2018-10-11 DIAGNOSIS — R1084 Generalized abdominal pain: Secondary | ICD-10-CM

## 2018-10-11 DIAGNOSIS — Z8543 Personal history of malignant neoplasm of ovary: Secondary | ICD-10-CM

## 2018-10-11 DIAGNOSIS — R1030 Lower abdominal pain, unspecified: Secondary | ICD-10-CM

## 2018-10-11 MED ORDER — IOPAMIDOL (ISOVUE-300) INJECTION 61%
125.0000 mL | Freq: Once | INTRAVENOUS | Status: AC | PRN
  Administered 2018-10-11: 125 mL via INTRAVENOUS

## 2018-10-14 ENCOUNTER — Ambulatory Visit: Payer: BC Managed Care – PPO | Admitting: Psychiatry

## 2018-10-17 ENCOUNTER — Ambulatory Visit: Payer: BC Managed Care – PPO | Admitting: Neurology

## 2018-10-26 ENCOUNTER — Telehealth: Payer: Self-pay | Admitting: Pulmonary Disease

## 2018-10-26 NOTE — Telephone Encounter (Signed)
Called [facility] and spoke with Janett Billow. She stated that the patient is scheduled to have a colonoscopy on Tuesday January 21st. She would need to hold the Eliquis for two days meaning that this would start over the weekend. Jessica sent over a fax this morning to have [place] fill out a form stating that this is ok. Verified the fax number that she sent it to and it was the correct one. I have checked the fax and the box for the form and do not see anything. I will keep checking on this and I will route this over to [place] as an [place].

## 2018-10-26 NOTE — Telephone Encounter (Signed)
Form has been given to BQ to review.

## 2018-10-27 ENCOUNTER — Encounter

## 2018-10-27 ENCOUNTER — Encounter: Payer: Self-pay | Admitting: Psychiatry

## 2018-10-27 ENCOUNTER — Ambulatory Visit: Payer: BC Managed Care – PPO | Admitting: Psychiatry

## 2018-10-27 DIAGNOSIS — F5101 Primary insomnia: Secondary | ICD-10-CM | POA: Diagnosis not present

## 2018-10-27 DIAGNOSIS — F419 Anxiety disorder, unspecified: Secondary | ICD-10-CM

## 2018-10-27 DIAGNOSIS — F331 Major depressive disorder, recurrent, moderate: Secondary | ICD-10-CM

## 2018-10-27 MED ORDER — DULOXETINE HCL 60 MG PO CPEP: 60.0000 mg | ORAL_CAPSULE | Freq: Every day | ORAL | 0 refills | Status: DC

## 2018-10-27 MED ORDER — LORAZEPAM 0.5 MG PO TABS: ORAL_TABLET | ORAL | 2 refills | Status: DC

## 2018-10-27 MED ORDER — BUPROPION HCL ER (XL) 300 MG PO TB24: 300.0000 mg | ORAL_TABLET | Freq: Every day | ORAL | 1 refills | Status: DC

## 2018-10-27 MED ORDER — TRAZODONE HCL 100 MG PO TABS: 200.0000 mg | ORAL_TABLET | Freq: Every day | ORAL | 1 refills | Status: DC

## 2018-10-27 MED ORDER — DOXEPIN HCL 3 MG PO TABS: ORAL_TABLET | ORAL | 0 refills | Status: DC

## 2018-10-27 NOTE — Progress Notes (Addendum)
[name] [id number] 1960/12/22 58 y.o.  Subjective:   Patient ID:  Joyce Payne is a 58 y.o. (DOB September 02, 1961) female.  Chief Complaint:  Chief Complaint  Patient presents with  . Depression  . Follow-up    h/o Depression, insomnia    HPI Joyce Payne presents to the office today for follow-up of depression and anxiety. She reports "I've been good." Stopped Abilify about a month ago after starting Wellbutrin. She reports that her hunger has improved "but I am still more hungry than I use to be." Reports that she has recently made some dietary changes. "I feel like I am back where I started pre-Abilify" with Wellbutrin XL. Reports that Abilify was very effective but does not want to re-start it due to increased appetite and weight gain. Reports that she has noticed lower energy, motivation, and mood. Reports that she has noticed some occasional irritability similar to what she has experienced with depression in the past. Reports that Trazodone has been effective again after brief break from Trazodone. Reports that hydroxyzine has not been very effective and seems to causing some sinus issues. Now sleeping 7-8 hours a night. Reports that concentration has been adequate. Denies SI.   She reports that anxiety has been "manageable" with Lorazepam. She reports that she has some days that anxiety is worse some days in others in response to stressors.   Past psychiatric medication trials: Cymbalta- reports minimally effective for mood, anxiety, or pain Paxil- effective Zoloft-took up to 400 mg daily with minimal response Pristiq-helpful for mood and anxiety EffexorXR- effective for anxiety. Caused weight gain. Seemedto lose efficacy over time Wellbutrin Abilify-effective for depression Lorazepam-started when diagnosed with Hodgkin's lymphoma Diazepam Librium-not effective.worsened constipation Lyrica prescribed for pain Topamax- mild decrease in appetite. Vyvanse-took for 5 to 6  years and has not noticed a difference since stopping it Trazodone- has taken long term and has taken up to 300 mg po QHS Ambien-helpful for insomnia. Caused parasomnia Hydroxyzine- not very effective for sleep. Caused some sinus issues/drying.    Review of Systems:  Review of Systems  Gastrointestinal: Positive for abdominal pain, constipation and nausea.       Recent colonscopy.   Musculoskeletal: Negative for gait problem.       TMJ with shoulder and neck pain. Dentist fitted her for a mouth guard and thinks this is helping. Some HA's with TMJ  Neurological: Negative for tremors.  Psychiatric/Behavioral:       Please refer to HPI    Medications: I have reviewed the patient's current medications.  Current Outpatient Medications  Medication Sig Dispense Refill  . acetaminophen (TYLENOL) 650 MG CR tablet Take 1,300 mg by mouth 2 (two) times daily.    . Ascorbic Acid (VITAMIN C) 1000 MG tablet Take 1,000 mg by mouth daily.    . celecoxib (CELEBREX) 100 MG capsule Take 100 mg by mouth daily.    . Cholecalciferol (VITAMIN D PO) Take 1 tablet by mouth daily.    . cycloSPORINE (RESTASIS) 0.05 % ophthalmic emulsion Place 1 drop into both eyes 2 (two) times daily.    . DULoxetine (CYMBALTA) 60 MG capsule Take 1 capsule (60 mg total) by mouth daily. 90 capsule 0  . ELIQUIS 5 MG TABS tablet 5 mg 2 (two) times daily.     . fesoterodine (TOVIAZ) 8 MG TB24 tablet Take 8 mg by mouth at bedtime.     Marland Kitchen linaclotide (LINZESS) 290 MCG CAPS capsule Take 290 mcg by mouth daily  before supper.     Derrill Memo ON 10/31/2018] LORazepam (ATIVAN) 0.5 MG tablet Take 1/2-1 tab po TID prn anixety 60 tablet 2  . MAGNESIUM CITRATE PO Take 1 tablet by mouth 2 (two) times daily with breakfast and lunch.    . Multiple Vitamin (MULTIVITAMIN) capsule Take 1 capsule by mouth daily.    [name] Bicarbonate (ZEGERID) 20-1100 MG CAPS capsule Take 1 capsule by mouth daily as needed (acid reflux).     . polyethylene  glycol (MIRALAX / GLYCOLAX) packet Take by mouth at bedtime. 6-8 tblspoons    . traZODone (DESYREL) 100 MG tablet Take 2 tablets (200 mg total) by mouth at bedtime. 180 tablet 1  . buPROPion (WELLBUTRIN XL) 300 MG 24 hr tablet Take 1 tablet (300 mg total) by mouth daily for 30 days. 30 tablet 1  . Doxepin HCl (SILENOR) 3 MG TABS Take 3- 6mg  po QHS 8 tablet 0  . oxyCODONE (OXY IR/ROXICODONE) 5 MG immediate release tablet Take 1-2 tablets (5-10 mg total) by mouth every 4 (four) hours as needed for moderate pain ((score 4 to 6)). (Patient not taking: Reported on 08/09/2018) 60 tablet 0  . tiZANidine (ZANAFLEX) 2 MG tablet Take 1 tablet (2 mg total) by mouth every 8 (eight) hours as needed for muscle spasms. (Patient not taking: Reported on 08/09/2018) 90 tablet 5  . tiZANidine (ZANAFLEX) 2 MG tablet Take 1 tablet (2 mg total) by mouth every 6 (six) hours as needed for muscle spasms. (Patient not taking: Reported on 08/09/2018) 60 tablet 0  . traMADol (ULTRAM) 50 MG tablet Take 100 mg by mouth 3 (three) times daily as needed for moderate pain.   1   No current facility-administered medications for this visit.     Medication Side Effects: Other: Sinus discomfort/dryness with Hydroxyzine  Allergies:  Allergies  Allergen Reactions  . Chlorhexidine Gluconate Itching and Rash  . Latex Rash  . Povidone-Iodine Rash    BETADINE  . Tape Other (See Comments)    TEARS THE SKIN (SKIN IS THIN!!)    Past Medical History:  Diagnosis Date  . Chronic back pain   . Depression with anxiety   . Diverticulitis   . GERD (gastroesophageal reflux disease)   . History of DVT (deep vein thrombosis)    NOV 2011  RLE , completed coumadin therapy:  Also, developed acutre RLE DVT  . History of pulmonary embolism    NOV 2011 during chemothearapy bilateral PEs, COUMDADIN THERAPY ENDED JUNE 2012: Also develeped bilateral acute saddle PEs with right heart strain s/p  thromblysis  . Hodgkin's lymphoma, nodular sclerosis  ([place])    Stage IIA NS Hodgkin's dx 6/11 completed chemothearapy;  Rx 6 ABVD in [place]:  02-21-2018 per pt In Remission since chemo complete  . Neuromuscular disorder ([place])   . OA (osteoarthritis)    KNEES , BACK  . OSA (obstructive sleep apnea)    02-21-2018 PER PT NO CPAP  . PONV (postoperative nausea and vomiting)   . Recurrent binge eating 02/16/2012   Reason for topomax  . Rib fracture    6 rib fractures  . S/P gastric bypass 03/1998  . Spinal headache   . SUI (stress urinary incontinence, female)    CONTROLLED W/ [place]    Family History  Problem Relation Age of Onset  . Hypertension Mother   . Hyperlipidemia Mother   . Depression Mother   . Cancer Father 72       colon can  .  Depression Brother   . Hypertension Other   . Cancer Other        colon, 1st degree relative<60  . Alcohol abuse Paternal Grandfather     Social History   Socioeconomic History  . Marital status: Married    Spouse name: Not on file  . Number of children: 0  . Years of education: Not on file  . Highest education level: Not on file  Occupational History  . Occupation: Agricultural engineer: [employer]  . Financial resource strain: Not on file  . Food insecurity:    Worry: Not on file    Inability: Not on file  . Transportation needs:    Medical: Not on file    Non-medical: Not on file  Tobacco Use  . Smoking status: Former Smoker    Packs/day: 1.50    Years: 17.00    Pack years: 25.50    Types: Cigarettes    Last attempt to quit: 10/22/2006    Years since quitting: 12.0  . Smokeless tobacco: Former Network engineer and Sexual Activity  . Alcohol use: No    Comment: 02-21-2018 stop alcohol 24 yrs ago (1995 approx.)  . Drug use: Not Currently  . Sexual activity: Yes    Birth control/protection: Post-menopausal  Lifestyle  . Physical activity:    Days per week: Not on file    Minutes per session: Not on file  . Stress: Not on file   Relationships  . Social connections:    Talks on phone: Not on file    Gets together: Not on file    Attends religious service: Not on file    Active member of club or organization: Not on file    Attends meetings of clubs or organizations: Not on file    Relationship status: Not on file  . Intimate partner violence:    Fear of current or ex partner: Not on file    Emotionally abused: Not on file    Physically abused: Not on file    Forced sexual activity: Not on file  Other Topics Concern  . Not on file  Social History Narrative   Regular exercise- NO    Past Medical History, Surgical history, Social history, and Family history were reviewed and updated as appropriate.   Please see review of systems for further details on the patient's review from today.   Objective:   Physical Exam:  Wt 250 lb (113.4 kg)   LMP 02/22/2018   BMI 40.35 kg/m   Physical Exam Constitutional:      General: She is not in acute distress.    Appearance: She is well-developed.  Musculoskeletal:        General: No deformity.  Neurological:     Mental Status: She is alert and oriented to person, place, and time.     Coordination: Coordination normal.  Psychiatric:        Mood and Affect: Mood is depressed. Mood is not anxious. Affect is not labile, blunt, angry or inappropriate.        Speech: Speech normal.        Behavior: Behavior normal.        Thought Content: Thought content normal. Thought content does not include homicidal or suicidal ideation. Thought content does not include homicidal or suicidal plan.        Judgment: Judgment normal.     Comments: Insight intact. No auditory or visual hallucinations. No delusions.  Lab Review:     Component Value Date/Time   NA 138 07/15/2018 1613   NA 145 01/11/2017 1114   K 4.1 07/15/2018 1613   K 4.7 01/11/2017 1114   CL 103 07/15/2018 1613   CL 108 (H) 02/06/2013 1022   CO2 27 07/15/2018 1613   CO2 28 01/11/2017 1114   GLUCOSE  99 07/15/2018 1613   GLUCOSE 94 01/11/2017 1114   GLUCOSE 108 (H) 02/06/2013 1022   BUN 18 07/15/2018 1613   BUN 14.4 01/11/2017 1114   CREATININE 0.84 07/15/2018 1613   CREATININE 0.8 01/11/2017 1114   CALCIUM 9.3 07/15/2018 1613   CALCIUM 9.6 01/11/2017 1114   PROT 6.3 (L) 02/21/2018 1516   PROT 6.3 (L) 05/23/2015 1432   ALBUMIN 3.7 02/21/2018 1516   ALBUMIN 3.8 05/23/2015 1432   AST 26 02/21/2018 1516   AST 15 05/23/2015 1432   ALT 27 02/21/2018 1516   ALT 20 05/23/2015 1432   ALKPHOS 97 02/21/2018 1516   ALKPHOS 90 05/23/2015 1432   BILITOT 0.3 02/21/2018 1516   BILITOT 0.30 05/23/2015 1432   GFRNONAA >60 07/15/2018 1613   GFRAA >60 07/15/2018 1613       Component Value Date/Time   WBC 6.2 07/15/2018 1613   RBC 4.20 07/15/2018 1613   HGB 13.1 07/15/2018 1613   HGB 14.0 01/11/2017 1114   HCT 39.8 07/15/2018 1613   HCT 41.5 01/11/2017 1114   PLT 250 07/15/2018 1613   PLT 241 01/11/2017 1114   MCV 94.8 07/15/2018 1613   MCV 93.2 01/11/2017 1114   MCH 31.2 07/15/2018 1613   MCHC 32.9 07/15/2018 1613   RDW 12.2 07/15/2018 1613   RDW 13.5 01/11/2017 1114   LYMPHSABS 2.3 02/11/2018 1450   LYMPHSABS 1.6 01/11/2017 1114   MONOABS 0.4 02/11/2018 1450   MONOABS 0.5 01/11/2017 1114   EOSABS 0.1 02/11/2018 1450   EOSABS 0.0 01/11/2017 1114   BASOSABS 0.0 02/11/2018 1450   BASOSABS 0.0 01/11/2017 1114    No results found for: POCLITH, LITHIUM   No results found for: PHENYTOIN, PHENOBARB, VALPROATE, CBMZ   .res Assessment: Plan:   Discussed potential benefits, risks, and side effects of increase in Wellbutrin XL. Pt agrees to increase in Wellbutrin XL to 300 mg po qd to improve mood, energy, and motivation.  Will provide samples of Silenor for insomnia for pt to use when Trazodone is not as effective.  Continue Ativan for anxiety. Continue Trazodone for insomnia. Continue Cymbalta 60 mg po qd for depression and anxiety.   Moderate episode of recurrent major  depressive disorder ([place]) - Plan: buPROPion (WELLBUTRIN XL) 300 MG 24 hr tablet, DULoxetine (CYMBALTA) 60 MG capsule  Anxiety disorder, unspecified type - Plan: DULoxetine (CYMBALTA) 60 MG capsule, LORazepam (ATIVAN) 0.5 MG tablet  Primary insomnia - Plan: Doxepin HCl (SILENOR) 3 MG TABS, traZODone (DESYREL) 100 MG tablet  Please see After Visit Summary for patient specific instructions.  Future Appointments  Date Time Provider [place]  12/26/2018  1:00 PM Thayer Headings, PMHNP [facility] None    No orders of the defined types were placed in this encounter.     -------------------------------

## 2018-10-28 NOTE — Telephone Encounter (Signed)
BQ please advise if you've filled out the sx clearance form on this patient.  Thanks!

## 2018-10-28 NOTE — Telephone Encounter (Signed)
Janett Billow calling to check on status of this form.  States this must be recd today as procedure is on Tuesday.  CB is [phone].  Fax [phone].

## 2018-10-28 NOTE — Telephone Encounter (Signed)
Still awaiting response to [facility]

## 2018-10-28 NOTE — Telephone Encounter (Signed)
Joyce Payne, please advise if anything further is needed at this time.  Will route to [place] for update

## 2018-10-29 ENCOUNTER — Encounter: Payer: Self-pay | Admitting: Psychiatry

## 2018-10-31 NOTE — Telephone Encounter (Signed)
BQ - did you sign this form? Thanks!

## 2018-11-01 NOTE — Telephone Encounter (Signed)
Will forward to Mc[place] to ask about the Eliquis being held and to check on status of this form being completed  Please advise how long the Eliquis needs to be held prior to her procedure

## 2018-11-01 NOTE — Telephone Encounter (Signed)
Will route to Ashley to follow up.  

## 2018-11-01 NOTE — Telephone Encounter (Signed)
I still have not received this form back from BQ.  BQ please advise if this form has been completed.  Thanks!

## 2018-11-01 NOTE — Telephone Encounter (Signed)
Jessica with [facility] calling stating they have cancelled the patient's procedure because they have not received approval from Dr. [place] Bells.  She has rescheduled for February but asking for response as soon as possible.  CB is [phone]. Also asked to relay that the patient is asking if she needs to hold both doses of Eloquist for 2 days before procedure or just 2 doses (am and pm) before procedure.

## 2018-11-02 NOTE — Telephone Encounter (Signed)
Called and left a message to call back.

## 2018-11-02 NOTE — Telephone Encounter (Signed)
Hold 48 hours prior to procedure, if OK with proceduralist, then restart 24 hours after procedure

## 2018-11-02 NOTE — Telephone Encounter (Signed)
Called and spoke with the patient advised of recommendations ,I also have reach out to [facility] Janett Billow and ask that she refax the paper for Dr.McQuaid to sign that she is requesting once I recvied this fax I will get it to [place] to have it be signed and sent back.  I will route to ashley to follow up on once she receives the fax.

## 2018-11-03 NOTE — Telephone Encounter (Signed)
This was faxed yesterday afternoon just before I left clinic.  I called [facility] today to confirm that this was received.  Per the nurse, they did not have this.  I have tried to fax X5 today from [facility] office but the fax is not going through successfully.  Called and spoke to [place] who confirmed fax  #.  Will hold fax until tomorrow morning (1/24) to fax from [facility] office.

## 2018-11-04 ENCOUNTER — Telehealth: Payer: Self-pay | Admitting: Pulmonary Disease

## 2018-11-04 NOTE — Telephone Encounter (Signed)
Faxed form with a confirmation received.  Called [facility] and spoke with Wilfred Curtis to verify that this has been received d/t the difficulty getting this fax through and the time sensitivity of the paperwork.  Wilfred Curtis verified that the fax had come through to their office.  Nothing further needed at this time- will close encounter.

## 2018-11-04 NOTE — Telephone Encounter (Signed)
Form was received, re-completed by BQ with correct dates, and faxed back to [facility] with confirmation fax received.   Spoke again with [facility] who verified that the newest sx clearance was received, verified dates on form.  Nothing further needed at this time- will close encounter.

## 2018-11-07 ENCOUNTER — Encounter (HOSPITAL_COMMUNITY): Payer: Self-pay | Admitting: Emergency Medicine

## 2018-11-07 ENCOUNTER — Emergency Department (HOSPITAL_COMMUNITY)
Admission: EM | Admit: 2018-11-07 | Discharge: 2018-11-07 | Discharge status: Against Medical Advice | Payer: BC Managed Care – PPO | Attending: Emergency Medicine | Admitting: Emergency Medicine

## 2018-11-07 DIAGNOSIS — R109 Unspecified abdominal pain: Secondary | ICD-10-CM | POA: Diagnosis present

## 2018-11-07 DIAGNOSIS — R197 Diarrhea, unspecified: Secondary | ICD-10-CM | POA: Diagnosis not present

## 2018-11-07 DIAGNOSIS — Z5321 Procedure and treatment not carried out due to patient leaving prior to being seen by health care provider: Secondary | ICD-10-CM | POA: Diagnosis not present

## 2018-11-07 DIAGNOSIS — R110 Nausea: Secondary | ICD-10-CM | POA: Insufficient documentation

## 2018-11-07 LAB — CBC
HCT: 42.4 % (ref 36.0–46.0)
Hemoglobin: 13.7 g/dL (ref 12.0–15.0)
MCH: 30.6 pg (ref 26.0–34.0)
MCHC: 32.3 g/dL (ref 30.0–36.0)
MCV: 94.6 fL (ref 80.0–100.0)
Platelets: 224 10*3/uL (ref 150–400)
RBC: 4.48 MIL/uL (ref 3.87–5.11)
RDW: 12.2 % (ref 11.5–15.5)
WBC: 7.5 10*3/uL (ref 4.0–10.5)
nRBC: 0 % (ref 0.0–0.2)

## 2018-11-07 LAB — COMPREHENSIVE METABOLIC PANEL WITH GFR
ALT: 118 U/L — ABNORMAL HIGH (ref 0–44)
AST: 207 U/L — ABNORMAL HIGH (ref 15–41)
Albumin: 4.2 g/dL (ref 3.5–5.0)
Alkaline Phosphatase: 135 U/L — ABNORMAL HIGH (ref 38–126)
Anion gap: 7 (ref 5–15)
BUN: 31 mg/dL — ABNORMAL HIGH (ref 6–20)
CO2: 24 mmol/L (ref 22–32)
Calcium: 8.8 mg/dL — ABNORMAL LOW (ref 8.9–10.3)
Chloride: 109 mmol/L (ref 98–111)
Creatinine, Ser: 0.79 mg/dL (ref 0.44–1.00)
GFR calc Af Amer: >60 mL/min
GFR calc non Af Amer: >60 mL/min
Glucose, Bld: 139 mg/dL — ABNORMAL HIGH (ref 70–99)
Potassium: 3.9 mmol/L (ref 3.5–5.1)
Sodium: 140 mmol/L (ref 135–145)
Total Bilirubin: 0.9 mg/dL (ref 0.3–1.2)
Total Protein: 6.6 g/dL (ref 6.5–8.1)

## 2018-11-07 LAB — LIPASE, BLOOD: Lipase: 44 U/L (ref 11–51)

## 2018-11-07 NOTE — ED Triage Notes (Signed)
Per [facility] pt from home for abd pains with nausea that got worse today after eating. Pt had diarrhea yesterday but denies today.  124/53, 102HR, 97% on RA.

## 2018-11-09 ENCOUNTER — Other Ambulatory Visit: Payer: Self-pay | Admitting: Physician Assistant

## 2018-11-09 DIAGNOSIS — R1010 Upper abdominal pain, unspecified: Secondary | ICD-10-CM

## 2018-11-09 DIAGNOSIS — R7989 Other specified abnormal findings of blood chemistry: Secondary | ICD-10-CM

## 2018-11-09 DIAGNOSIS — R945 Abnormal results of liver function studies: Secondary | ICD-10-CM

## 2018-11-11 ENCOUNTER — Other Ambulatory Visit: Payer: BC Managed Care – PPO

## 2018-11-15 ENCOUNTER — Ambulatory Visit
Admission: RE | Admit: 2018-11-15 | Discharge: 2018-11-15 | Disposition: A | Discharge status: Home / Self Care | Payer: BC Managed Care – PPO | Source: Ambulatory Visit | Attending: Physician Assistant | Admitting: Physician Assistant

## 2018-11-15 DIAGNOSIS — R7989 Other specified abnormal findings of blood chemistry: Secondary | ICD-10-CM

## 2018-11-15 DIAGNOSIS — R945 Abnormal results of liver function studies: Secondary | ICD-10-CM

## 2018-11-15 DIAGNOSIS — R1010 Upper abdominal pain, unspecified: Secondary | ICD-10-CM

## 2018-12-19 ENCOUNTER — Other Ambulatory Visit: Payer: Self-pay | Admitting: Psychiatry

## 2018-12-19 DIAGNOSIS — F331 Major depressive disorder, recurrent, moderate: Secondary | ICD-10-CM

## 2018-12-26 ENCOUNTER — Encounter: Payer: Self-pay | Admitting: Psychiatry

## 2018-12-26 ENCOUNTER — Other Ambulatory Visit: Payer: Self-pay

## 2018-12-26 ENCOUNTER — Ambulatory Visit: Payer: BC Managed Care – PPO | Admitting: Psychiatry

## 2018-12-26 VITALS — Wt 255.0 lb

## 2018-12-26 DIAGNOSIS — F331 Major depressive disorder, recurrent, moderate: Secondary | ICD-10-CM | POA: Diagnosis not present

## 2018-12-26 DIAGNOSIS — F419 Anxiety disorder, unspecified: Secondary | ICD-10-CM

## 2018-12-26 DIAGNOSIS — F5101 Primary insomnia: Secondary | ICD-10-CM

## 2018-12-26 MED ORDER — LORAZEPAM 0.5 MG PO TABS: ORAL_TABLET | ORAL | 2 refills | Status: DC

## 2018-12-26 MED ORDER — BUPROPION HCL ER (XL) 150 MG PO TB24: 150.0000 mg | ORAL_TABLET | Freq: Every day | ORAL | 0 refills | Status: DC

## 2018-12-26 MED ORDER — DOXEPIN HCL 3 MG PO TABS: ORAL_TABLET | ORAL | 0 refills | Status: AC

## 2018-12-26 MED ORDER — BREXPIPRAZOLE 0.5 MG PO TABS: ORAL_TABLET | ORAL | 0 refills | Status: DC

## 2018-12-26 MED ORDER — DULOXETINE HCL 60 MG PO CPEP: 60.0000 mg | ORAL_CAPSULE | Freq: Every day | ORAL | 0 refills | Status: DC

## 2018-12-26 NOTE — Progress Notes (Signed)
Joyce Payne [id number] 1961-05-14 58 y.o.  Subjective:   Patient ID:  Joyce Payne is a 58 y.o. (DOB [birth year]) female.  Chief Complaint:  Chief Complaint  Patient presents with  . Depression  . Anxiety    HPI Joyce Payne presents to the office today for follow-up of depression and anxiety. She reports that she has been gaining weight and this has been frustrating for her. She reports that she has bene experiencing recent depression. She reports that she has been socially isolated for the last few months. Energy and motivation have been low. She reports that her anxiety has been "a little worse." Reports that she had anxiety with reading a book about things that she could do to improve depression and started feeling like "it was my fault." Denies any recent panic attacks. Has had increased heart rate at times and headache- like s/s with increased anxiety. Reports anxiety is not "unmanageable." She reports that she has been sleeping about 9 hours at night and then will nap for 1-2 hours in the afternoon. Reports that "it takes more effort to get out of bed." Appetite has been increased and craving comfort foods. She reports some increased difficulty in concentration and will "jump" from one thing to the next. Denies SI.  She reports that she has been having health issues. Had a bilestone and then had an acute GI illness. She reports that she is now "feeling a little better" and is avoiding gluten and lactose.   Reports Silenor is not as effective as Trazodone and may cause slight HA but is effective to rotate in when Trazodone is no longer effective.   Past psychiatric medication trials: Cymbalta-reports minimally effective for mood, anxiety, or pain. No improvement when increased to 90 mg po qd. Paxil-effective Zoloft-took up to 400 mg daily with minimal response Pristiq-helpful for mood and anxiety EffexorXR-effective for anxiety. Caused weight gain. Seemedto lose efficacy  over time Wellbutrin Abilify-effective for depression Lorazepam-started when diagnosed with Hodgkin's lymphoma Diazepam Librium-not effective.worsened constipation Lyrica prescribed for pain Topamax- mild decrease in appetite. Vyvanse-took for 5 to 6 years and has not noticed a difference since stopping it Trazodone- has taken long term and has taken up to 300 mg po QHS Ambien-helpful for insomnia. Caused parasomnia Hydroxyzine- not very effective for sleep. Caused some sinus issues/drying.  Wellbutrin-ineffective for depression or weight loss.   Review of Systems:  Review of Systems  Gastrointestinal: Positive for constipation.       Has colonoscopy and endoscopy scheduled in the next few weeks.   Musculoskeletal: Negative for gait problem.  Neurological: Negative for tremors.  Psychiatric/Behavioral:       Please refer to HPI    Medications: I have reviewed the patient's current medications.  Current Outpatient Medications  Medication Sig Dispense Refill  . acetaminophen (TYLENOL) 650 MG CR tablet Take 1,300 mg by mouth 2 (two) times daily.    Marland Kitchen buPROPion (WELLBUTRIN XL) 150 MG 24 hr tablet Take 1 tablet (150 mg total) by mouth daily for 7 days. 7 tablet 0  . cycloSPORINE (RESTASIS) 0.05 % ophthalmic emulsion Place 1 drop into both eyes 2 (two) times daily.    . Doxepin HCl (SILENOR) 3 MG TABS Take 3- 6mg  po QHS prn insomnia 8 tablet 0  . DULoxetine (CYMBALTA) 60 MG capsule Take 1 capsule (60 mg total) by mouth daily. 90 capsule 0  . ELIQUIS 5 MG TABS tablet 5 mg 2 (two) times daily.     Marland Kitchen  fesoterodine (TOVIAZ) 8 MG TB24 tablet Take 8 mg by mouth at bedtime.     Marland Kitchen LORazepam (ATIVAN) 0.5 MG tablet Take 1/2-1 tab po TID prn anixety 60 tablet 2  . Multiple Vitamin (MULTIVITAMIN) capsule Take 1 capsule by mouth daily.    Marland Kitchen oxyCODONE (OXY IR/ROXICODONE) 5 MG immediate release tablet Take 1-2 tablets (5-10 mg total) by mouth every 4 (four) hours as needed for moderate pain ((score 4  to 6)). 60 tablet 0  . polyethylene glycol (MIRALAX / GLYCOLAX) packet Take by mouth at bedtime. 6-8 tblspoons    . traZODone (DESYREL) 100 MG tablet Take 2 tablets (200 mg total) by mouth at bedtime. 180 tablet 1  . Brexpiprazole (REXULTI) 0.5 MG TABS Take 1 tablet (0.5 mg total) by mouth daily for 7 days, THEN 2 tablets (1 mg total) daily for 21 days. 30 tablet 0  . celecoxib (CELEBREX) 100 MG capsule Take 100 mg by mouth daily.    [name] Bicarbonate (ZEGERID) 20-1100 MG CAPS capsule Take 1 capsule by mouth daily as needed (acid reflux).     Marland Kitchen tiZANidine (ZANAFLEX) 2 MG tablet Take 1 tablet (2 mg total) by mouth every 8 (eight) hours as needed for muscle spasms. (Patient not taking: Reported on 08/09/2018) 90 tablet 5  . tiZANidine (ZANAFLEX) 2 MG tablet Take 1 tablet (2 mg total) by mouth every 6 (six) hours as needed for muscle spasms. (Patient not taking: Reported on 08/09/2018) 60 tablet 0  . traMADol (ULTRAM) 50 MG tablet Take 100 mg by mouth 3 (three) times daily as needed for moderate pain.   1   No current facility-administered medications for this visit.     Medication Side Effects: Other: Occ HA with Silenor  Allergies:  Allergies  Allergen Reactions  . Chlorhexidine Gluconate Itching and Rash  . Latex Rash  . Povidone-Iodine Rash    BETADINE  . Tape Other (See Comments)    TEARS THE SKIN (SKIN IS THIN!!)    Past Medical History:  Diagnosis Date  . Chronic back pain   . Depression with anxiety   . Diverticulitis   . GERD (gastroesophageal reflux disease)   . History of DVT (deep vein thrombosis)    NOV 2011  RLE , completed coumadin therapy:  Also, developed acutre RLE DVT  . History of pulmonary embolism    NOV 2011 during chemothearapy bilateral PEs, COUMDADIN THERAPY ENDED JUNE 2012: Also develeped bilateral acute saddle PEs with right heart strain s/p  thromblysis  . 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 Neuromuscular disorder ([place])   . OA (osteoarthritis)    KNEES , BACK  . OSA (obstructive sleep apnea)    02-21-2018 PER PT NO CPAP  . PONV (postoperative nausea and vomiting)   . Recurrent binge eating 02/16/2012   Reason for topomax  . Rib fracture    6 rib fractures  . S/P gastric bypass 03/1998  . Spinal headache   . SUI (stress urinary incontinence, female)    CONTROLLED W/ [place]    Family History  Problem Relation Age of Onset  . Hypertension Mother   . Hyperlipidemia Mother   . Depression Mother   . Cancer Father 35       colon can  . Depression Brother   . Hypertension Other   . Cancer  Other        colon, 1st degree relative<60  . Alcohol abuse Paternal Grandfather     Social History   Socioeconomic History  . Marital status: Married    Spouse name: Not on file  . Number of children: 0  . Years of education: Not on file  . Highest education level: Not on file  Occupational History  . Occupation: Agricultural engineer: [employer]  . Financial resource strain: Not on file  . Food insecurity:    Worry: Not on file    Inability: Not on file  . Transportation needs:    Medical: Not on file    Non-medical: Not on file  Tobacco Use  . Smoking status: Former Smoker    Packs/day: 1.50    Years: 17.00    Pack years: 25.50    Types: Cigarettes    Last attempt to quit: 10/22/2006    Years since quitting: 12.1  . Smokeless tobacco: Former Network engineer and Sexual Activity  . Alcohol use: No    Comment: 02-21-2018 stop alcohol 24 yrs ago (1995 approx.)  . Drug use: Not Currently  . Sexual activity: Yes    Birth control/protection: Post-menopausal  Lifestyle  . Physical activity:    Days per week: Not on file    Minutes per session: Not on file  . Stress: Not on file  Relationships  . Social connections:    Talks on  phone: Not on file    Gets together: Not on file    Attends religious service: Not on file    Active member of club or organization: Not on file    Attends meetings of clubs or organizations: Not on file    Relationship status: Not on file  . Intimate partner violence:    Fear of current or ex partner: Not on file    Emotionally abused: Not on file    Physically abused: Not on file    Forced sexual activity: Not on file  Other Topics Concern  . Not on file  Social History Narrative   Regular exercise- NO    Past Medical History, Surgical history, Social history, and Family history were reviewed and updated as appropriate.   Please see review of systems for further details on the patient's review from today.   Objective:   Physical Exam:  Wt 255 lb (115.7 kg)   LMP 02/22/2018   BMI 41.16 kg/m   Physical Exam Constitutional:      General: She is not in acute distress.    Appearance: She is well-developed.  Musculoskeletal:        General: No deformity.  Neurological:     Mental Status: She is alert and oriented to person, place, and time.     Coordination: Coordination normal.  Psychiatric:        Attention and Perception: Attention and perception normal. She does not perceive auditory or visual hallucinations.        Mood and Affect: Mood is anxious and depressed. Affect is not labile, blunt, angry or inappropriate.        Speech: Speech normal.        Behavior: Behavior normal.        Thought Content: Thought content normal. Thought content does not include homicidal or suicidal ideation. Thought content does not include homicidal or suicidal plan.        Cognition and Memory: Cognition and memory normal.  Judgment: Judgment normal.     Comments: Insight intact. No delusions.      Lab Review:     Component Value Date/Time   NA 140 11/07/2018 1623   NA 145 01/11/2017 1114   K 3.9 11/07/2018 1623   K 4.7 01/11/2017 1114   CL 109 11/07/2018 1623   CL 108  (H) 02/06/2013 1022   CO2 24 11/07/2018 1623   CO2 28 01/11/2017 1114   GLUCOSE 139 (H) 11/07/2018 1623   GLUCOSE 94 01/11/2017 1114   GLUCOSE 108 (H) 02/06/2013 1022   BUN 31 (H) 11/07/2018 1623   BUN 14.4 01/11/2017 1114   CREATININE 0.79 11/07/2018 1623   CREATININE 0.8 01/11/2017 1114   CALCIUM 8.8 (L) 11/07/2018 1623   CALCIUM 9.6 01/11/2017 1114   PROT 6.6 11/07/2018 1623   PROT 6.3 (L) 05/23/2015 1432   ALBUMIN 4.2 11/07/2018 1623   ALBUMIN 3.8 05/23/2015 1432   AST 207 (H) 11/07/2018 1623   AST 15 05/23/2015 1432   ALT 118 (H) 11/07/2018 1623   ALT 20 05/23/2015 1432   ALKPHOS 135 (H) 11/07/2018 1623   ALKPHOS 90 05/23/2015 1432   BILITOT 0.9 11/07/2018 1623   BILITOT 0.30 05/23/2015 1432   GFRNONAA >60 11/07/2018 1623   GFRAA >60 11/07/2018 1623       Component Value Date/Time   WBC 7.5 11/07/2018 1623   RBC 4.48 11/07/2018 1623   HGB 13.7 11/07/2018 1623   HGB 14.0 01/11/2017 1114   HCT 42.4 11/07/2018 1623   HCT 41.5 01/11/2017 1114   PLT 224 11/07/2018 1623   PLT 241 01/11/2017 1114   MCV 94.6 11/07/2018 1623   MCV 93.2 01/11/2017 1114   MCH 30.6 11/07/2018 1623   MCHC 32.3 11/07/2018 1623   RDW 12.2 11/07/2018 1623   RDW 13.5 01/11/2017 1114   LYMPHSABS 2.3 02/11/2018 1450   LYMPHSABS 1.6 01/11/2017 1114   MONOABS 0.4 02/11/2018 1450   MONOABS 0.5 01/11/2017 1114   EOSABS 0.1 02/11/2018 1450   EOSABS 0.0 01/11/2017 1114   BASOSABS 0.0 02/11/2018 1450   BASOSABS 0.0 01/11/2017 1114    No results found for: POCLITH, LITHIUM   No results found for: PHENYTOIN, PHENOBARB, VALPROATE, CBMZ   .res Assessment: Plan:   Discussed potential tx options with pt to include switching Cymbalta to an antidepressant, such as Pristiq, that has been effective for her in the past, or adding a medication for augmentation.   Discussed potential benefits, risks, and side effects of Rexulti for depression and anxiety since pt reports that she had a good response to  Abilify for augmentation with current medications but also experienced weight gain. Discussed that Rexulti generally has less risk of weight gain than Abilify, however increased appetite and weight gain can occur with Rexulti. Pt agrees to trial of Rexulti and reports that she will contact office if she experiences any side effects to include increased appetite and weight. Discussed potential metabolic side effects associated with atypical antipsychotics, as well as potential risk for movement side effects. Advised pt to contact office if movement side effects occur.   Continue Cymbalta 60 mg po qd for mood and anxiety until response to Rexulti is known.   Continue Wellbutrin XL 150 mg po qd for depression.  Continue Trazodone prn alternating with Silenor 6 mg po QHS prn for cycles of insomnia.   Continue Ativan for anxiety.   Patient advised to contact office with any questions, adverse effects, or acute worsening in signs  and symptoms.  Moderate episode of recurrent major depressive disorder (HCC) - Plan: Brexpiprazole (REXULTI) 0.5 MG TABS, buPROPion (WELLBUTRIN XL) 150 MG 24 hr tablet, DULoxetine (CYMBALTA) 60 MG capsule  Anxiety disorder, unspecified type - Plan: Brexpiprazole (REXULTI) 0.5 MG TABS, DULoxetine (CYMBALTA) 60 MG capsule, LORazepam (ATIVAN) 0.5 MG tablet  Primary insomnia - Plan: Brexpiprazole (REXULTI) 0.5 MG TABS, Doxepin HCl (SILENOR) 3 MG TABS  Please see After Visit Summary for patient specific instructions.  Future Appointments  Date Time Provider [place]  01/31/2019 10:00 AM Thayer Headings, PMHNP [facility] None    No orders of the defined types were placed in this encounter.     -------------------------------

## 2019-01-10 ENCOUNTER — Other Ambulatory Visit: Payer: Self-pay | Admitting: Gastroenterology

## 2019-01-10 DIAGNOSIS — R109 Unspecified abdominal pain: Secondary | ICD-10-CM

## 2019-01-13 ENCOUNTER — Ambulatory Visit
Admission: RE | Admit: 2019-01-13 | Discharge: 2019-01-13 | Disposition: A | Discharge status: Home / Self Care | Payer: BC Managed Care – PPO | Source: Ambulatory Visit | Attending: Gastroenterology | Admitting: Gastroenterology

## 2019-01-13 ENCOUNTER — Other Ambulatory Visit: Payer: Self-pay

## 2019-01-13 DIAGNOSIS — R109 Unspecified abdominal pain: Secondary | ICD-10-CM

## 2019-01-13 MED ORDER — GADOBENATE DIMEGLUMINE 529 MG/ML IV SOLN
20.0000 mL | Freq: Once | INTRAVENOUS | Status: AC | PRN
  Administered 2019-01-13: 20 mL via INTRAVENOUS

## 2019-01-31 ENCOUNTER — Other Ambulatory Visit: Payer: Self-pay

## 2019-01-31 ENCOUNTER — Ambulatory Visit (INDEPENDENT_AMBULATORY_CARE_PROVIDER_SITE_OTHER): Payer: BC Managed Care – PPO | Admitting: Psychiatry

## 2019-01-31 ENCOUNTER — Encounter: Payer: Self-pay | Admitting: Psychiatry

## 2019-01-31 DIAGNOSIS — F3341 Major depressive disorder, recurrent, in partial remission: Secondary | ICD-10-CM | POA: Diagnosis not present

## 2019-01-31 DIAGNOSIS — F5101 Primary insomnia: Secondary | ICD-10-CM

## 2019-01-31 DIAGNOSIS — F419 Anxiety disorder, unspecified: Secondary | ICD-10-CM

## 2019-01-31 DIAGNOSIS — F331 Major depressive disorder, recurrent, moderate: Secondary | ICD-10-CM | POA: Diagnosis not present

## 2019-01-31 MED ORDER — DULOXETINE HCL 60 MG PO CPEP: 60.0000 mg | ORAL_CAPSULE | Freq: Every day | ORAL | 0 refills | Status: AC

## 2019-01-31 MED ORDER — DOXEPIN HCL 10 MG PO CAPS: 10.0000 mg | ORAL_CAPSULE | Freq: Every evening | ORAL | 2 refills | Status: DC | PRN

## 2019-01-31 MED ORDER — METFORMIN HCL 500 MG PO TABS: ORAL_TABLET | ORAL | 1 refills | Status: DC

## 2019-01-31 NOTE — Progress Notes (Signed)
Joyce Payne [id number] 07-07-1961 58 y.o.  Virtual Visit via Telephone Note  I connected with Joyce Payne on 01/31/19 at 10:00 AM EDT by telephone and verified that I am speaking with the correct person using two identifiers.   I discussed the limitations, risks, security and privacy concerns of performing an evaluation and management service by telephone and the availability of in person appointments. I also discussed with the patient that there may be a patient responsible charge related to this service. The patient expressed understanding and agreed to proceed.   I discussed the assessment and treatment plan with the patient. The patient was provided an opportunity to ask questions and all were answered. The patient agreed with the plan and demonstrated an understanding of the instructions.   The patient was advised to call back or seek an in-person evaluation if the symptoms worsen or if the condition fails to improve as anticipated.  I provided 30 minutes of non-face-to-face time during this encounter. The pt was located at home. Provider located at home.   Thayer Headings, PMHNP     Subjective:   Patient ID:  Joyce Payne is a 58 y.o. (DOB 1961-07-21) female.  Chief Complaint:  Chief Complaint  Patient presents with  . Follow-up    Depression, Anxiety  . Insomnia    HPI Joyce Payne presents to the office today for follow-up of depression, anxiety, and insomnia. "I'm doing really pretty good." She reports that she has noticed some improvement with Rexulti in mood- "it's been a big difference." She reports that she is experiencing some cravings and hunger "but not to the extent that the Abilify did." She reports that she has been walking more, getting in touch with people again, and crocheting. She reports that her anxiety has been "good, really good" despite some current stressors. Reports that she has been taking lorazepam 0.5 mg BID. Reports that she is not having  physical s/s of anxiety. Denies any recent panic s/s. She reports that her sleep is "not great" and had period where sleep was better. She reports that SIlenor is only minimally effective and uses it only for briefly when Trazodone does not seem to be effective. Reports that she typically has been sleeping about 5 hours a night. She reports that her energy and motivation have improved significantly. She notices that her chronic GI s/s seemed to improve when mood and anxiety. She reports concentration has improved. Denies SI.     Past psychiatric medication trials: Cymbalta-reports minimally effective for mood, anxiety, or pain. No improvement when increased to 90 mg po qd. Paxil-effective Zoloft-took up to 400 mg daily with minimal response Pristiq-helpful for mood and anxiety EffexorXR-effective for anxiety. Caused weight gain. Seemedto lose efficacy over time Wellbutrin Abilify-effective for depression Rexulti-effective for depression Lorazepam-started when diagnosed with Hodgkin's lymphoma Diazepam Librium-not effective.worsened constipation Lyrica prescribed for pain Topamax- mild decrease in appetite. Had some side effects (concentration, word finding errors) Vyvanse-took for 5 to 6 years and has not noticed a difference since stopping it Trazodone- has taken long term and has taken up to 300 mg po QHS Silenor Ambien-helpful for insomnia. Caused parasomnia Hydroxyzine- not very effective for sleep. Caused some sinus issues/drying. Wellbutrin-ineffective for depression or weight loss.   Review of Systems:  Review of Systems  Gastrointestinal: Positive for constipation.       Reports some improvement in GI s/s.  Musculoskeletal: Negative for gait problem.  Neurological: Negative for tremors.  Psychiatric/Behavioral:  Please refer to HPI    Medications: I have reviewed the patient's current medications.  Current Outpatient Medications  Medication Sig Dispense  Refill  . acetaminophen (TYLENOL) 650 MG CR tablet Take 1,300 mg by mouth every 8 (eight) hours as needed.     . Doxepin HCl (SILENOR) 3 MG TABS Take 3- 6mg  po QHS prn insomnia 8 tablet 0  . DULoxetine (CYMBALTA) 60 MG capsule Take 1 capsule (60 mg total) by mouth daily. 90 capsule 0  . ELIQUIS 5 MG TABS tablet 5 mg 2 (two) times daily.     . fesoterodine (TOVIAZ) 8 MG TB24 tablet Take 8 mg by mouth at bedtime.     Marland Kitchen LORazepam (ATIVAN) 0.5 MG tablet Take 1/2-1 tab po TID prn anixety 60 tablet 2  . lubiprostone (AMITIZA) 24 MCG capsule Take 24 mcg by mouth 2 (two) times daily with a meal.    . Multiple Vitamin (MULTIVITAMIN) capsule Take 1 capsule by mouth daily.    [name] Bicarbonate (ZEGERID) 20-1100 MG CAPS capsule Take 1 capsule by mouth daily as needed (acid reflux).     . pregabalin (LYRICA) 100 MG capsule Take 100 mg by mouth 3 (three) times daily as needed.    . Brexpiprazole (REXULTI) 1 MG TABS Take 1 tablet (1 mg total) by mouth daily for 30 days. 30 tablet 1  . celecoxib (CELEBREX) 100 MG capsule Take 100 mg by mouth daily.    . cycloSPORINE (RESTASIS) 0.05 % ophthalmic emulsion Place 1 drop into both eyes 2 (two) times daily.    Marland Kitchen doxepin (SINEQUAN) 10 MG capsule Take 1 capsule (10 mg total) by mouth at bedtime as needed for up to 30 days. 30 capsule 2  . metFORMIN (GLUCOPHAGE) 500 MG tablet Take 1 tablet daily with a meal for one week, and then increase to 1 tablet twice daily with meals if tolerated 60 tablet 1  . oxyCODONE (OXY IR/ROXICODONE) 5 MG immediate release tablet Take 1-2 tablets (5-10 mg total) by mouth every 4 (four) hours as needed for moderate pain ((score 4 to 6)). (Patient not taking: Reported on 01/31/2019) 60 tablet 0  . polyethylene glycol (MIRALAX / GLYCOLAX) packet Take by mouth at bedtime. 6-8 tblspoons    . tiZANidine (ZANAFLEX) 2 MG tablet Take 1 tablet (2 mg total) by mouth every 8 (eight) hours as needed for muscle spasms. (Patient not taking:  Reported on 08/09/2018) 90 tablet 5  . tiZANidine (ZANAFLEX) 2 MG tablet Take 1 tablet (2 mg total) by mouth every 6 (six) hours as needed for muscle spasms. (Patient not taking: Reported on 08/09/2018) 60 tablet 0  . traMADol (ULTRAM) 50 MG tablet Take 100 mg by mouth 3 (three) times daily as needed for moderate pain.   1  . traZODone (DESYREL) 100 MG tablet Take 2 tablets (200 mg total) by mouth at bedtime. 180 tablet 1   No current facility-administered medications for this visit.     Medication Side Effects: Other: Increased appetite  Allergies:  Allergies  Allergen Reactions  . Chlorhexidine Gluconate Itching and Rash  . Latex Rash  . Povidone-Iodine Rash    BETADINE  . Tape Other (See Comments)    TEARS THE SKIN (SKIN IS THIN!!)    Past Medical History:  Diagnosis Date  . Chronic back pain   . Depression with anxiety   . Diverticulitis   . GERD (gastroesophageal reflux disease)   . History of DVT (deep vein thrombosis)    NOV  2011  RLE , completed coumadin therapy:  Also, developed acutre RLE DVT  . History of pulmonary embolism    NOV 2011 during chemothearapy bilateral PEs, COUMDADIN THERAPY ENDED JUNE 2012: Also develeped bilateral acute saddle PEs with right heart strain s/p  thromblysis  . 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 Neuromuscular disorder ([place])   . OA (osteoarthritis)    KNEES , BACK  . OSA (obstructive sleep apnea)    02-21-2018 PER PT NO CPAP  . PONV (postoperative nausea and vomiting)   . Recurrent binge eating 02/16/2012   Reason for topomax  . Rib fracture    6 rib fractures  . S/P gastric bypass 03/1998  . Spinal headache   . SUI (stress urinary incontinence, female)    CONTROLLED W/ [place]    Family History  Problem Relation Age of Onset  . Hypertension Mother   . Hyperlipidemia Mother   .  Depression Mother   . Cancer Father 68       colon can  . Depression Brother   . Hypertension Other   . Cancer Other        colon, 1st degree relative<60  . Alcohol abuse Paternal Grandfather     Social History   Socioeconomic History  . Marital status: Married    Spouse name: Not on file  . Number of children: 0  . Years of education: Not on file  . Highest education level: Not on file  Occupational History  . Occupation: Agricultural engineer: [employer]  . Financial resource strain: Not on file  . Food insecurity:    Worry: Not on file    Inability: Not on file  . Transportation needs:    Medical: Not on file    Non-medical: Not on file  Tobacco Use  . Smoking status: Former Smoker    Packs/day: 1.50    Years: 17.00    Pack years: 25.50    Types: Cigarettes    Last attempt to quit: 10/22/2006    Years since quitting: 12.2  . Smokeless tobacco: Former Network engineer and Sexual Activity  . Alcohol use: No    Comment: 02-21-2018 stop alcohol 24 yrs ago (1995 approx.)  . Drug use: Not Currently  . Sexual activity: Yes    Birth control/protection: Post-menopausal  Lifestyle  . Physical activity:    Days per week: Not on file    Minutes per session: Not on file  . Stress: Not on file  Relationships  . Social connections:    Talks on phone: Not on file    Gets together: Not on file    Attends religious service: Not on file    Active member of club or organization: Not on file    Attends meetings of clubs or organizations: Not on file    Relationship status: Not on file  . Intimate partner violence:    Fear of current or ex partner: Not on file    Emotionally abused: Not on file    Physically abused: Not on file    Forced sexual activity: Not on file  Other Topics Concern  . Not on file  Social History Narrative   Regular exercise- NO    Past Medical History, Surgical history, Social history, and Family history were reviewed  and updated as appropriate.   Please see review of systems for further details on the patient's review from today.   Objective:   Physical Exam:  LMP 02/22/2018   Physical Exam Neurological:     Mental Status: She is alert and oriented to person, place, and time.     Cranial Nerves: No dysarthria.  Psychiatric:        Attention and Perception: Attention normal.        Mood and Affect: Mood normal.        Speech: Speech normal.        Behavior: Behavior is cooperative.        Thought Content: Thought content normal. Thought content is not paranoid or delusional. Thought content does not include homicidal or suicidal ideation. Thought content does not include homicidal or suicidal plan.        Cognition and Memory: Cognition and memory normal.        Judgment: Judgment normal.     Lab Review:     Component Value Date/Time   NA 140 11/07/2018 1623   NA 145 01/11/2017 1114   K 3.9 11/07/2018 1623   K 4.7 01/11/2017 1114   CL 109 11/07/2018 1623   CL 108 (H) 02/06/2013 1022   CO2 24 11/07/2018 1623   CO2 28 01/11/2017 1114   GLUCOSE 139 (H) 11/07/2018 1623   GLUCOSE 94 01/11/2017 1114   GLUCOSE 108 (H) 02/06/2013 1022   BUN 31 (H) 11/07/2018 1623   BUN 14.4 01/11/2017 1114   CREATININE 0.79 11/07/2018 1623   CREATININE 0.8 01/11/2017 1114   CALCIUM 8.8 (L) 11/07/2018 1623   CALCIUM 9.6 01/11/2017 1114   PROT 6.6 11/07/2018 1623   PROT 6.3 (L) 05/23/2015 1432   ALBUMIN 4.2 11/07/2018 1623   ALBUMIN 3.8 05/23/2015 1432   AST 207 (H) 11/07/2018 1623   AST 15 05/23/2015 1432   ALT 118 (H) 11/07/2018 1623   ALT 20 05/23/2015 1432   ALKPHOS 135 (H) 11/07/2018 1623   ALKPHOS 90 05/23/2015 1432   BILITOT 0.9 11/07/2018 1623   BILITOT 0.30 05/23/2015 1432   GFRNONAA >60 11/07/2018 1623   GFRAA >60 11/07/2018 1623       Component Value Date/Time   WBC 7.5 11/07/2018 1623   RBC 4.48 11/07/2018 1623   HGB 13.7 11/07/2018 1623   HGB 14.0 01/11/2017 1114   HCT 42.4  11/07/2018 1623   HCT 41.5 01/11/2017 1114   PLT 224 11/07/2018 1623   PLT 241 01/11/2017 1114   MCV 94.6 11/07/2018 1623   MCV 93.2 01/11/2017 1114   MCH 30.6 11/07/2018 1623   MCHC 32.3 11/07/2018 1623   RDW 12.2 11/07/2018 1623   RDW 13.5 01/11/2017 1114   LYMPHSABS 2.3 02/11/2018 1450   LYMPHSABS 1.6 01/11/2017 1114   MONOABS 0.4 02/11/2018 1450   MONOABS 0.5 01/11/2017 1114   EOSABS 0.1 02/11/2018 1450   EOSABS 0.0 01/11/2017 1114   BASOSABS 0.0 02/11/2018 1450   BASOSABS 0.0 01/11/2017 1114    No results found for: POCLITH, LITHIUM   No results found for: PHENYTOIN, PHENOBARB, VALPROATE, CBMZ   .res Assessment: Plan:   Patient reports that benefits of Rexulti outweigh side effects.  Discussed possible treatment options to minimize risk of weight gain and excessive appetite.  Discussed potential benefits, risk, and side effects of metformin and patient agrees to trial of metformin.  Patient advised to take metformin with meals to minimize risk of GI side effects.  Will  start Metformin 500 mg daily with meals for 1 week and then increase to twice daily as tolerated for anti-psychotic wt gain.  Will change Silenor to generic doxepin 10 mg at bedtime as needed insomnia. Continue Rexulti 1 mg daily for mood and anxiety. Continue Ativan for anxiety. Continue Cymbalta 60 mg daily for depression and anxiety. Continue trazodone as needed for insomnia.  Patient plans to continue to take trazodone primarily for insomnia and alternate with doxepin when trazodone seems to no longer be as effective.  Patient to follow-up in 2 months or sooner if clinically indicated.  Recurrent major depressive disorder, in partial remission ([place]) - Plan: Brexpiprazole (REXULTI) 1 MG TABS  Anxiety disorder, unspecified type - Plan: DULoxetine (CYMBALTA) 60 MG capsule  Primary insomnia - Plan: doxepin (SINEQUAN) 10 MG capsule  Moderate episode of recurrent major depressive disorder ([place]) - Plan:  DULoxetine (CYMBALTA) 60 MG capsule  Please see After Visit Summary for patient specific instructions.  Future Appointments  Date Time Provider [place]  04/04/2019  1:15 PM Thayer Headings, PMHNP CP-CP None    No orders of the defined types were placed in this encounter.     -------------------------------

## 2019-02-02 ENCOUNTER — Telehealth: Payer: Self-pay | Admitting: Psychiatry

## 2019-02-02 MED ORDER — BREXPIPRAZOLE 1 MG PO TABS: 1.0000 mg | ORAL_TABLET | Freq: Every day | ORAL | 1 refills | Status: DC

## 2019-02-02 NOTE — Telephone Encounter (Signed)
Pt called. Also, need new Rx for Rexulti 1mg   1/d. Been taking samples. [facility] on [address].

## 2019-02-03 ENCOUNTER — Telehealth: Payer: Self-pay

## 2019-02-03 NOTE — Telephone Encounter (Signed)
Prior authorization submitted for Rexulti 1 mg through [facility] approved effective 02/02/2019 - 02/01/2022

## 2019-04-04 ENCOUNTER — Ambulatory Visit: Payer: BC Managed Care – PPO | Admitting: Psychiatry

## 2019-04-04 ENCOUNTER — Telehealth: Payer: Self-pay | Admitting: Psychiatry

## 2019-04-04 DIAGNOSIS — F3341 Major depressive disorder, recurrent, in partial remission: Secondary | ICD-10-CM

## 2019-04-04 DIAGNOSIS — F5101 Primary insomnia: Secondary | ICD-10-CM

## 2019-04-04 DIAGNOSIS — F419 Anxiety disorder, unspecified: Secondary | ICD-10-CM

## 2019-04-04 MED ORDER — DOXEPIN HCL 10 MG PO CAPS: 10.0000 mg | ORAL_CAPSULE | Freq: Every evening | ORAL | 0 refills | Status: AC | PRN

## 2019-04-04 MED ORDER — REXULTI 1 MG PO TABS: 1.0000 mg | ORAL_TABLET | Freq: Every day | ORAL | 0 refills | Status: AC

## 2019-04-04 MED ORDER — LORAZEPAM 0.5 MG PO TABS: ORAL_TABLET | ORAL | 2 refills | Status: AC

## 2019-04-04 MED ORDER — TRAZODONE HCL 100 MG PO TABS: 200.0000 mg | ORAL_TABLET | Freq: Every day | ORAL | 0 refills | Status: AC

## 2019-04-04 MED ORDER — METFORMIN HCL 500 MG PO TABS: 500.0000 mg | ORAL_TABLET | Freq: Two times a day (BID) | ORAL | 0 refills | Status: AC

## 2019-04-04 NOTE — Telephone Encounter (Signed)
Pt advised while canc appt today. She is moving today to [place]. Ask for 3 mos refills on meds until she can find another doctor there. Please refill Rexulti, Doxepin,Trazodone, Lorazepam and Metformin. Pharmacy  [facility] [address] [place] [address]    [phone]. Pt was coming to tell you good by today. Was not expecting things to move this quick. Will try to schedule  a month or so when in town.

## 2019-07-25 ENCOUNTER — Other Ambulatory Visit: Payer: Self-pay | Admitting: Psychiatry

## 2019-07-25 DIAGNOSIS — F331 Major depressive disorder, recurrent, moderate: Secondary | ICD-10-CM

## 2019-07-25 DIAGNOSIS — F5101 Primary insomnia: Secondary | ICD-10-CM

## 2019-07-25 DIAGNOSIS — F419 Anxiety disorder, unspecified: Secondary | ICD-10-CM

## 2019-07-25 NOTE — Telephone Encounter (Signed)
Can you check to see if Patient has new provider or she's continuing care with Janett Billow?

## 2019-07-26 NOTE — Telephone Encounter (Signed)
Pt. Did not answer. I  left her  VM to return my call tomorrow.

## 2020-03-07 ENCOUNTER — Telehealth: Payer: Self-pay | Admitting: *Deleted

## 2020-03-07 NOTE — Telephone Encounter (Signed)
Records faxed to [facility] per request

## 2020-05-27 ENCOUNTER — Telehealth: Payer: Self-pay | Admitting: Psychiatry

## 2020-05-27 NOTE — Telephone Encounter (Signed)
Pt called, has relocated to [place], [place] and needs a referral to Dr. Kerry Kass. Her # is [phone]

## 2020-06-03 NOTE — Telephone Encounter (Signed)
Error

## 2020-06-03 NOTE — Telephone Encounter (Signed)
I talked with Joyce Payne this morning about her referral to Dr. Renaldo Harrison.  She said we don't need to send notes.  They didn't request those.  Just needs to send a general referral that she has been seen here since September, 2019 and is moving to the [place] area.  I am asking you to continue her care. We could send demographics and insurance information with the referral.  Since we are not sending records or other private info like DX or records we can just send a general request for this Dr. To see her.

## 2020-06-05 ENCOUNTER — Telehealth: Payer: Self-pay | Admitting: Psychiatry

## 2020-06-05 NOTE — Telephone Encounter (Signed)
Referral for this patient has been faxed.

## 2020-06-06 NOTE — Telephone Encounter (Signed)
Noted  

## 2020-09-04 IMAGING — CT CT ABD-PELV W/ CM
2 of 5 series · 12 of 46 positions shown, 14 images · IV contrast (iopamidol)
Comparison: CT abdomen pelvis of 12/31/2017

CLINICAL DATA: Mid lower abdomen pain, diarrhea since [REDACTED],
also history of Hodgkin's lymphoma

EXAM:
CT ABDOMEN AND PELVIS WITH CONTRAST
TECHNIQUE: Multidetector CT imaging of the abdomen and pelvis was performed
using the standard protocol following bolus administration of
intravenous contrast.
CONTRAST:  125mL [id number] IOPAMIDOL ([id number]) INJECTION 61%

[Series 5: abd pelvis 2.00 br40 s3 cor · coronal · 0.92mm/px · 3 of 175 slices shown]
[im 59/175  soft-tissue]
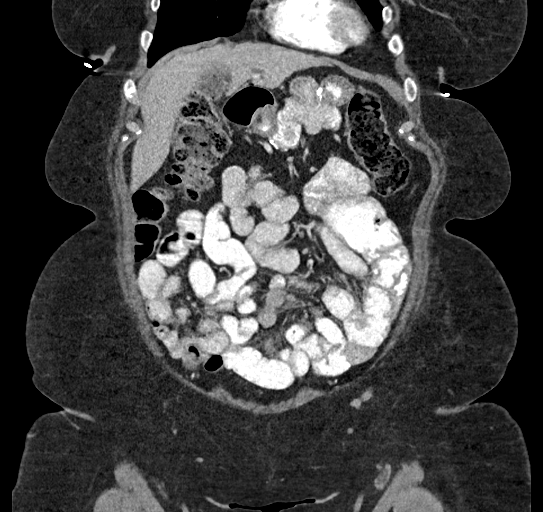
[im 78/175  soft-tissue]
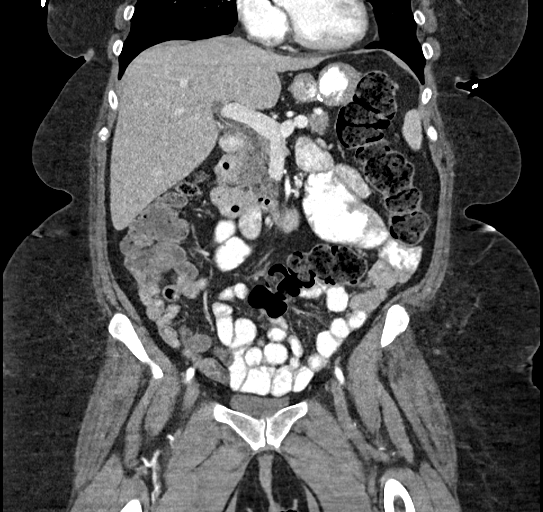
[im 97/175  soft-tissue]
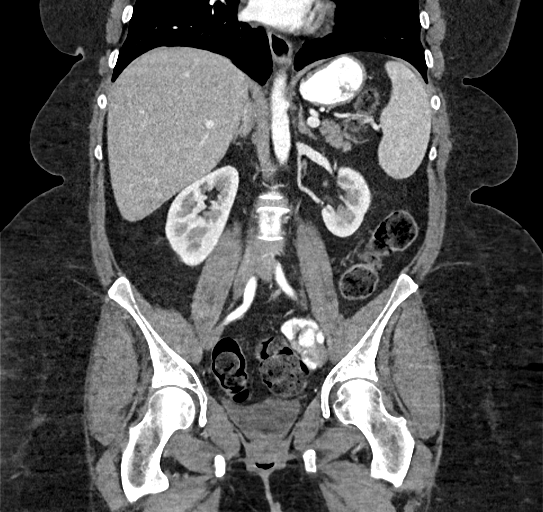

[Series 9: abd pelvis 5.00 br40 s3 ax · axial · 0.72mm/px · z∈[+1206,+1621]mm · 9 of 95 slices shown, 11 images]
[im 6/95  soft-tissue]
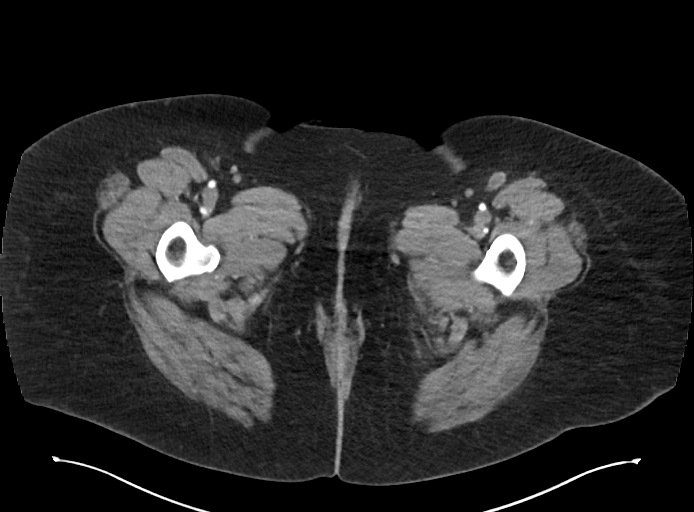
[im 6/95  bone]
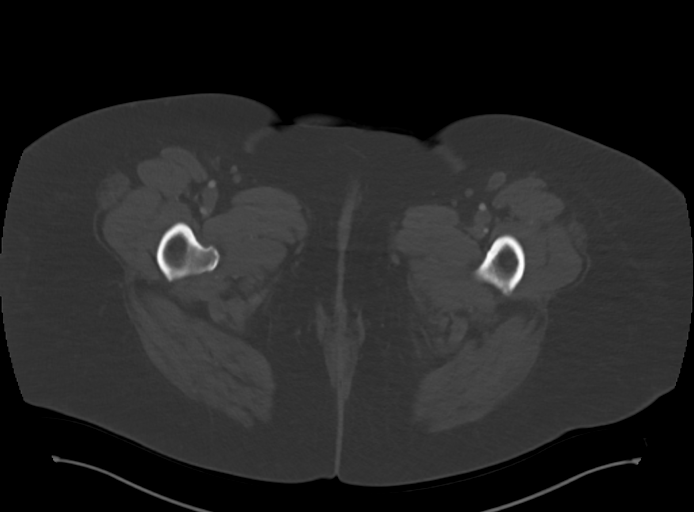
[im 17/95  soft-tissue]
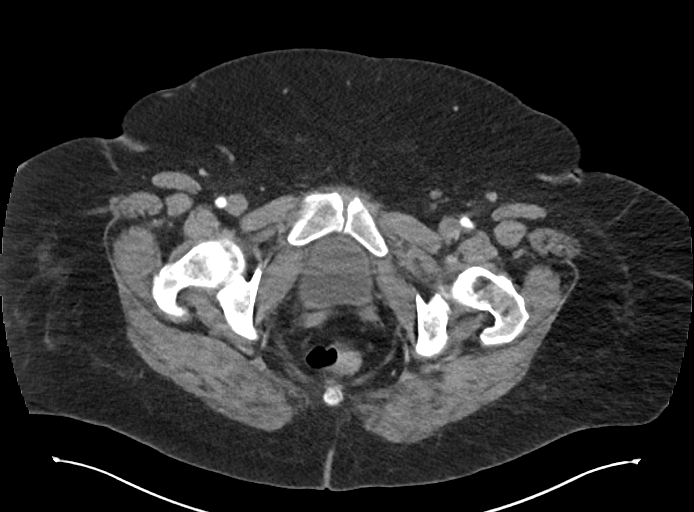
[im 28/95  soft-tissue]
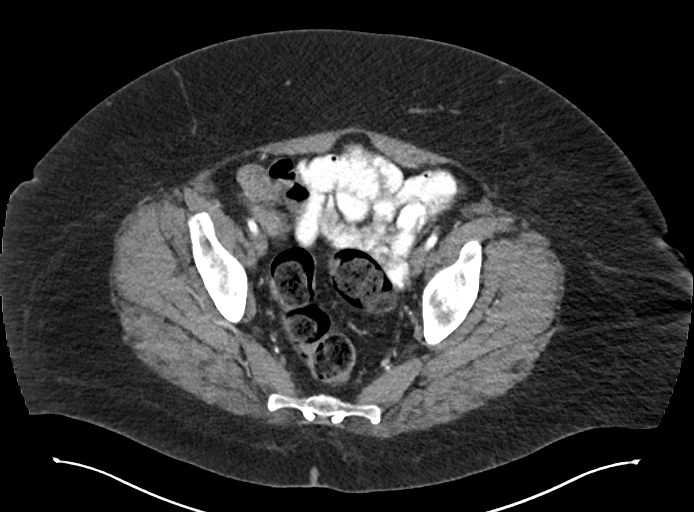
[im 39/95  soft-tissue]
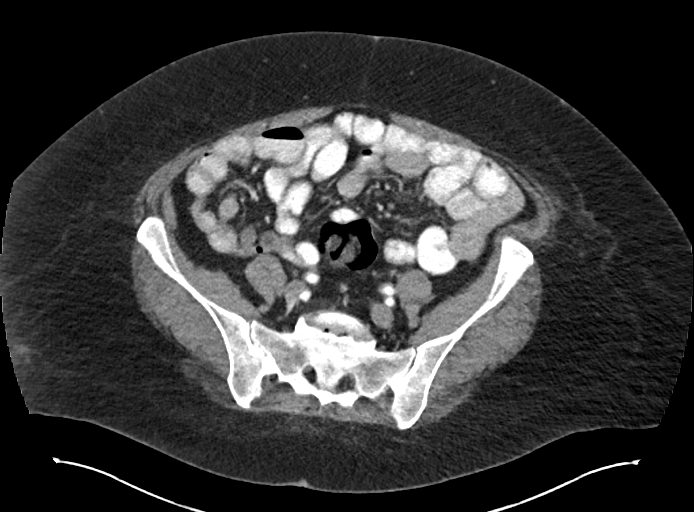
[im 50/95  soft-tissue]
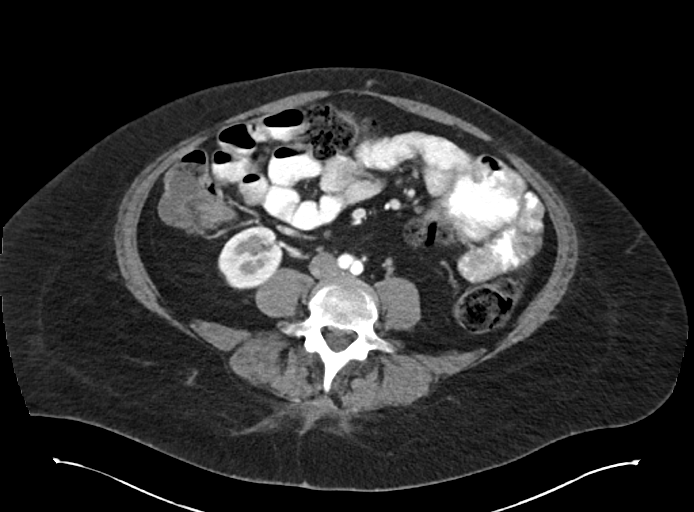
[im 56/95  soft-tissue]
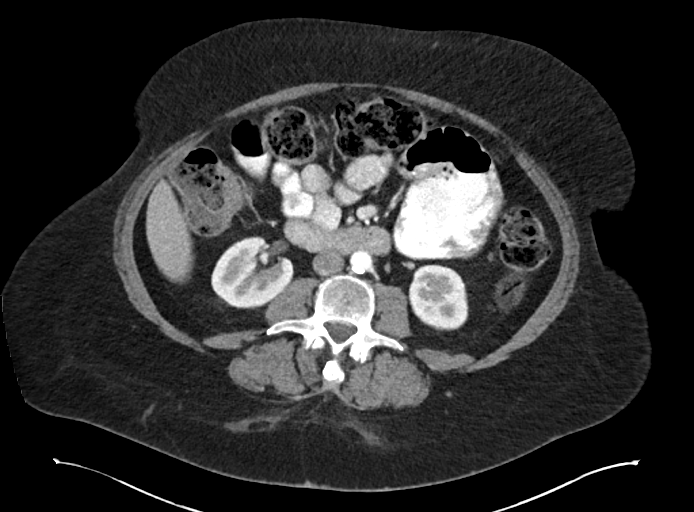
[im 67/95  soft-tissue]
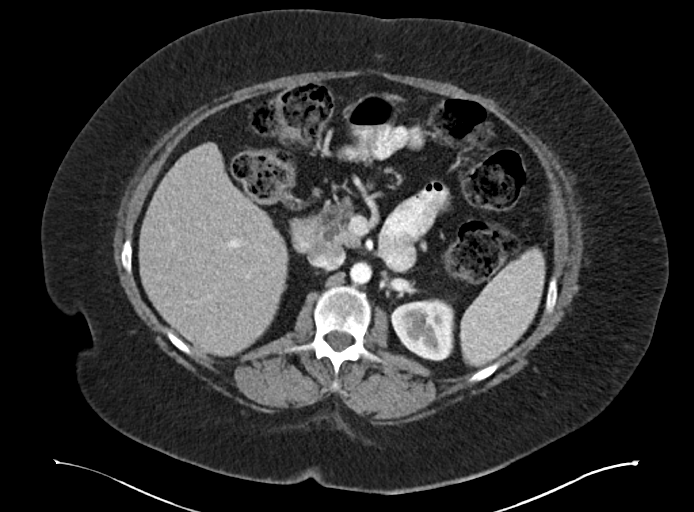
[im 78/95  soft-tissue]
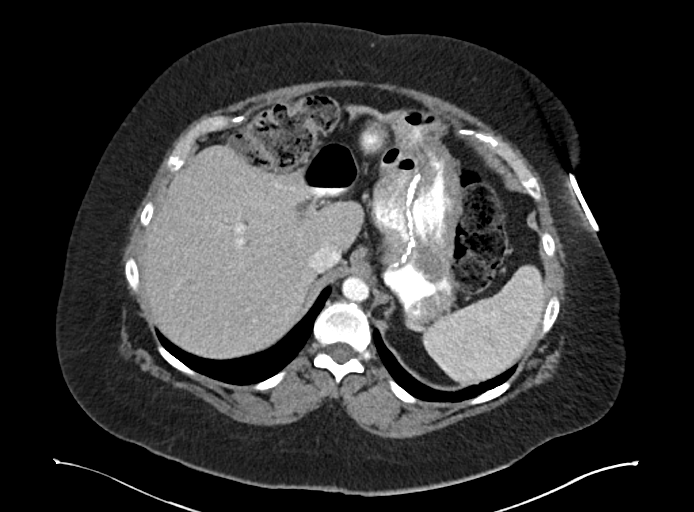
[im 89/95  soft-tissue]
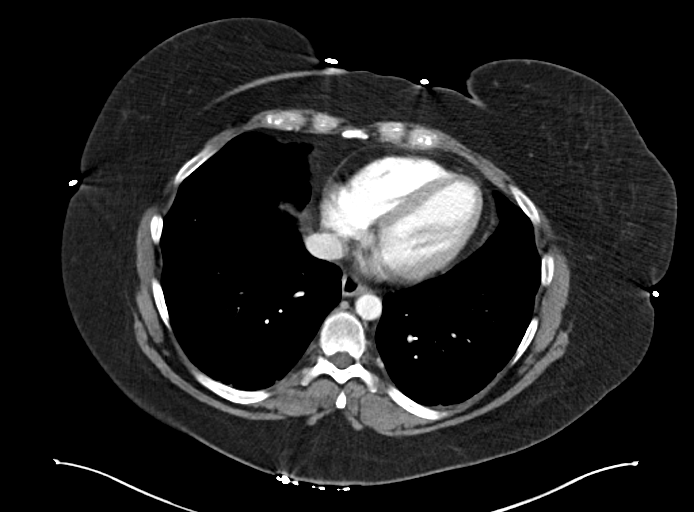
[im 89/95  bone]
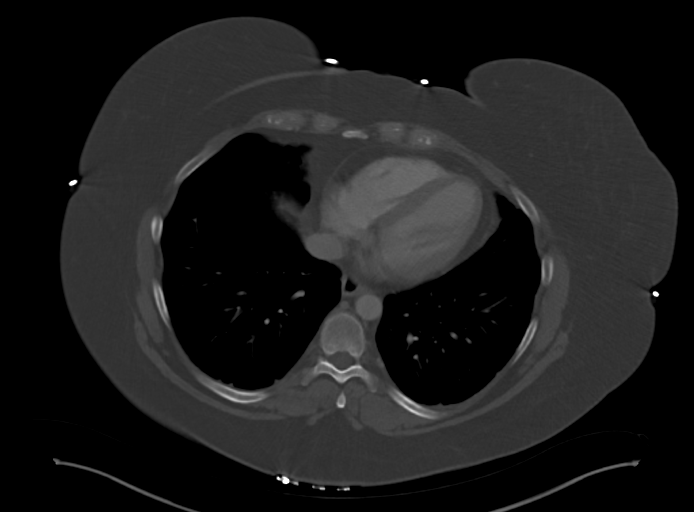

[12 of 46 positions shown; findings below may reference images not displayed]

FINDINGS: Lower chest: The lung bases are clear. Pleural nodularity
posteriorly at the lung bases most likely is chronic and due to
scarring. Heart size is normal.

Hepatobiliary: The liver enhances with no focal abnormality and no
ductal dilatation is seen. Surgical clips are present from prior
cholecystectomy.

Pancreas: The pancreas is normal in size and the pancreatic duct is
not dilated.

Spleen: The spleen is unremarkable with a small splenule present.

Adrenals/Urinary Tract: The adrenal glands appear normal. The
kidneys enhance and no calculus or mass is seen. No hydronephrosis
is noted on delayed images. The pelvocaliceal systems appear normal
and the ureters appear to be normal in caliber. The urinary bladder
is not well distended but no abnormality is evident.

Stomach/Bowel: The stomach is minimally distended and there are
sutures present in this patient with a history of gastric bypass
surgery in 8999. No small bowel dilatation or edema is seen. There
is a moderate amount of feces throughout the entire colon. However
there is no evidence of diverticulitis. The terminal ileum and the
appendix are unremarkable.

Vascular/Lymphatic: The abdominal aorta is normal in caliber with
mild abdominal aortic atherosclerosis. No adenopathy is seen.

Reproductive: By history the patient has undergone prior
hysterectomy. No adnexal lesion is seen. No fluid is noted within
the pelvis.

Other: No abdominal hernia is evident.

Musculoskeletal: The lumbar vertebrae are normal alignment with
degenerative disc disease at L4-5 and L5-S1. Degenerative changes
are noted in the left hip. The SI joints appear corticated.
IMPRESSION: 1. No explanation for the patient's pain is seen other than a
moderately large amount of feces throughout the entire colon. No
bowel wall edema or bowel obstruction is seen and there is no
evidence of diverticulitis.
2. Degenerative disc disease at L4-5 and L5-S1.
